# Patient Record
Sex: Male | Born: 1940
Health system: Southern US, Community
[De-identification: ages and names within clinical notes are randomized; demographics above are authoritative.]

## PROBLEM LIST (undated history)

## (undated) DIAGNOSIS — N200 Calculus of kidney: Secondary | ICD-10-CM

## (undated) DIAGNOSIS — R519 Headache, unspecified: Secondary | ICD-10-CM

## (undated) DIAGNOSIS — K219 Gastro-esophageal reflux disease without esophagitis: Secondary | ICD-10-CM

## (undated) DIAGNOSIS — R51 Headache: Secondary | ICD-10-CM

## (undated) DIAGNOSIS — G589 Mononeuropathy, unspecified: Secondary | ICD-10-CM

## (undated) DIAGNOSIS — F419 Anxiety disorder, unspecified: Secondary | ICD-10-CM

## (undated) DIAGNOSIS — E079 Disorder of thyroid, unspecified: Secondary | ICD-10-CM

## (undated) DIAGNOSIS — I10 Essential (primary) hypertension: Secondary | ICD-10-CM

## (undated) DIAGNOSIS — K5900 Constipation, unspecified: Secondary | ICD-10-CM

## (undated) DIAGNOSIS — N529 Male erectile dysfunction, unspecified: Secondary | ICD-10-CM

## (undated) DIAGNOSIS — M1990 Unspecified osteoarthritis, unspecified site: Secondary | ICD-10-CM

## (undated) DIAGNOSIS — M519 Unspecified thoracic, thoracolumbar and lumbosacral intervertebral disc disorder: Secondary | ICD-10-CM

## (undated) DIAGNOSIS — Z923 Personal history of irradiation: Secondary | ICD-10-CM

## (undated) DIAGNOSIS — I871 Compression of vein: Secondary | ICD-10-CM

## (undated) DIAGNOSIS — J189 Pneumonia, unspecified organism: Secondary | ICD-10-CM

## (undated) HISTORY — DX: Disorder of thyroid, unspecified: E07.9

## (undated) HISTORY — DX: Anxiety disorder, unspecified: F41.9

## (undated) HISTORY — DX: Essential (primary) hypertension: I10

## (undated) HISTORY — PX: TONSILLECTOMY: SUR1361

## (undated) HISTORY — PX: COLONOSCOPY: SHX174

## (undated) HISTORY — PX: LUMBAR DISC SURGERY: SHX700

## (undated) HISTORY — DX: Gastro-esophageal reflux disease without esophagitis: K21.9

## (undated) HISTORY — PX: CARDIAC SURGERY: SHX584

## (undated) HISTORY — PX: BREAST BIOPSY: SHX20

## (undated) HISTORY — DX: Male erectile dysfunction, unspecified: N52.9

## (undated) HISTORY — DX: Compression of vein: I87.1

## (undated) HISTORY — DX: Unspecified thoracic, thoracolumbar and lumbosacral intervertebral disc disorder: M51.9

---

## 1993-04-20 HISTORY — PX: US ECHOCARDIOGRAPHY: HXRAD669

## 1997-12-20 ENCOUNTER — Ambulatory Visit (HOSPITAL_COMMUNITY): Admission: RE | Admit: 1997-12-20 | Discharge: 1997-12-20 | Payer: Self-pay | Admitting: Family Medicine

## 1998-10-20 ENCOUNTER — Emergency Department (HOSPITAL_COMMUNITY): Admission: EM | Admit: 1998-10-20 | Discharge: 1998-10-20 | Payer: Self-pay | Admitting: Emergency Medicine

## 2000-06-14 ENCOUNTER — Encounter: Payer: Self-pay | Admitting: Family Medicine

## 2000-06-14 ENCOUNTER — Encounter: Admission: RE | Admit: 2000-06-14 | Discharge: 2000-06-14 | Payer: Self-pay | Admitting: Family Medicine

## 2000-08-08 ENCOUNTER — Emergency Department (HOSPITAL_COMMUNITY): Admission: EM | Admit: 2000-08-08 | Discharge: 2000-08-08 | Payer: Self-pay | Admitting: Emergency Medicine

## 2003-08-14 ENCOUNTER — Inpatient Hospital Stay (HOSPITAL_COMMUNITY): Admission: RE | Admit: 2003-08-14 | Discharge: 2003-08-22 | Payer: Self-pay | Admitting: Neurosurgery

## 2005-10-15 ENCOUNTER — Ambulatory Visit (HOSPITAL_COMMUNITY): Admission: RE | Admit: 2005-10-15 | Discharge: 2005-10-15 | Payer: Self-pay | Admitting: Neurosurgery

## 2007-01-24 ENCOUNTER — Emergency Department (HOSPITAL_COMMUNITY): Admission: EM | Admit: 2007-01-24 | Discharge: 2007-01-24 | Payer: Self-pay | Admitting: Emergency Medicine

## 2010-02-12 ENCOUNTER — Ambulatory Visit: Payer: Self-pay | Admitting: Cardiology

## 2010-09-14 ENCOUNTER — Other Ambulatory Visit: Payer: Self-pay | Admitting: Cardiology

## 2010-09-14 DIAGNOSIS — I10 Essential (primary) hypertension: Secondary | ICD-10-CM

## 2010-09-14 MED ORDER — LISINOPRIL-HYDROCHLOROTHIAZIDE 20-12.5 MG PO TABS: 1.0000 | ORAL_TABLET | Freq: Every day | ORAL | Status: DC

## 2010-09-14 NOTE — Telephone Encounter (Signed)
escribe medication per fax request  

## 2010-09-14 NOTE — Telephone Encounter (Signed)
[facility] ON [address] SAID CALLED OVER AND HAS NOT HEARD BACK. PATIENT STATES THAT HE THINKS [facility] LOST HIS SCRIPT FOR 3 MONTHS -THE DRUG IS  LISINOPRIL 20MG  AND HE WANTS IT FOR 3 MONTHS FOR THE YR.

## 2010-10-23 NOTE — H&P (Signed)
NAME:  George Dyer, George Dyer NO.:  [id number]   MEDICAL RECORD NO.:  [id number]                   PATIENT TYPE:  INP   LOCATION:  2899                                 FACILITY:  [facility]   PHYSICIAN:  Hilda Lias, M.D.                DATE OF BIRTH:  [birth year]   DATE OF ADMISSION:  08/14/2003  DATE OF DISCHARGE:                                HISTORY & PHYSICAL   George Dyer is a gentleman who was seen by me initially about a month ago  in my office with back pain for many years.  The pain is going not only to  the lumbar area but down to both legs, the right worse than the left one,  and feeling that the right calf is having a burning sensation.  The patient  was seen before by a neurosurgeon who advised him to surgery, but now he is  feeling that he is to the point that it is getting more difficult for him  walk, and he wants to proceed with surgery.  He had an MRI which was three  years old, and a new one which was a repeat.  Spinal surgery is advised.   MEDICAL HISTORY:  Open heart surgery.   ALLERGIES:  He is allergic to CODEINE.   SOCIAL HISTORY:  Negative.   FAMILY HISTORY:  Mother is 28 years old with heart disease.   REVIEW OF SYSTEMS:  Positive for high blood pressure and back pain.   PHYSICAL EXAMINATION:  GENERAL:  The patient who came to my office with his  wife.  He was limping from the right leg.  HEENT:  Normal.  LUNGS:  Clear.  HEART:  Sounds are normal.  There is a scar in the midline from a previous  surgery.  ABDOMEN:  Normal.  NEUROLOGIC:  Strength 5/5 except in the right foot where he has weakness of  dorsiflexion 4/5 and 2/5 on plantar flexion.  He complained of pain to the  right calf.  Reflexes symmetrical with decrease of the right ankle jerk.   The MRI showed that he has a severe case of degenerative disk disease at the  level of L4-5 with a herniated disk to the right.  He had a new repeat x-ray  which showed that the  degenerative disk disease has progressed at the level  of 4-5.  He has incidentally a small benign tumor at the level of the mid  root.   CLINICAL IMPRESSION:  Degenerative disk disease, L4-5, with a radiculopathy  and weakness of the right foot.   DISPOSITION:  The patient is being admitted for surgery.  The procedure will  be an L4-L5 diskectomy with interbody fusion with posterior ___________.  He  and his wife know about the risk with the surgery including the possibility  of infection, CSF leak, failure of the bone graft, failure of the hardware,  damage to the vessels  of the abdomen.                                                Hilda Lias, M.D.    EB/MEDQ  D:  08/14/2003  T:  08/14/2003  Job:  [id number]

## 2010-10-23 NOTE — Op Note (Signed)
NAME:  George Dyer, George Dyer                        ACCOUNT NO.:  [id number]   MEDICAL RECORD NO.:  [id number]                   PATIENT TYPE:  INP   LOCATION:  3172                                 FACILITY:  [facility]   PHYSICIAN:  Hilda Lias, M.D.                DATE OF BIRTH:  [birth year]   DATE OF PROCEDURE:  08/14/2003  DATE OF DISCHARGE:                                 OPERATIVE REPORT   PREOPERATIVE DIAGNOSIS:  L4-L5 degenerative disc disease with chronic L5  radiculopathy and foraminal stenosis.   POSTOPERATIVE DIAGNOSIS:  L4-L5 degenerative disc disease with chronic L5  radiculopathy and foraminal stenosis.   PROCEDURE:  Bilateral L5 laminectomy and facetectomy, bilateral L4-L5  discectomy, decompression of the L4 and L5 nerve root, interbody fusion with  allograft and autograft, pedicle screws for L4-L5, posterolateral fusion  with autograft L4 to L5 transverse process, C-arm, Cell Saver.   SURGEON:  Hilda Lias, M.D.   ASSISTANT:  Coletta Memos, M.D.   CLINICAL HISTORY:  The patient was admitted because of back pain with  radiation to both legs, right worse than left.  This problem had been going  on for many years and in the past, he was advised fusion by neurosurgeon.  We repeated the x-rays which showed, indeed, there was some worsening of the  degenerative disc disease at L4-L5.  The patient had small incidental  neuroma at the level of L2-L3.  When I reviewed the MRI, it was present  before three years ago and there has bene no changes in the size.  The  patient wanted to proceed with surgery because he was getting worse.   PROCEDURE:  The patient was taken to the OR and after intubation, he was  positioned in a prone manner.  The back was prepped with Betadine.  A  midline incision from the upper part of L4 to the lower part of L5 was made.  The muscles and fascia were retracted laterally until we were able to feel  the lateral facet as well as the transverse  process of L4 and L5.  We proved  we were in the right place with an x-ray showing the level of L4-L5.  Then  we removed the spinous process of L4 as well as the lamina and the facet.  We went laterally and we decompressed the L4-L5 nerve root.  On the right  side, he has a herniated disc with some extension laterally.  An incision  was made and bilateral total gross discectomy was accomplished.  The  endplates were curetted and two pieces of allograft 10 by 24 was inserted.  Inbetween and laterally, we used the patient's autograft.  Then, we went  laterally, we identified the transverse process of L4-L5.  The periosteum  was removed and using the autograft, the area was filled with that mixture.  Then, with the help of the C-arm, we localized the pedicles of  L4 and L5.  A  pedicle probe was inserted followed by a screw, 6.5 by 45.  AP and lateral  views showed there was good position.  We felt the foramen and there was no  evidence of any hardware in the foramen.  Having done this, rods were used  from L4 to L5 pedicle screws followed by tap.  The area was secured in  place.  A crosslink was used from right to left.  Then, the area was  irrigated.  We palpated the L4 and L5 nerve root and there was plenty of  space.  The area was irrigated.  Marcaine was used to infiltrate the muscle  and the wound was closed with Vicryl and Steri-Strips.                                              Hilda Lias, M.D.   EB/MEDQ  D:  08/14/2003  T:  08/14/2003  Job:  [id number]

## 2010-10-23 NOTE — Discharge Summary (Signed)
NAME:  George Dyer, George Dyer                        ACCOUNT NO.:  [id number]   MEDICAL RECORD NO.:  [id number]                   PATIENT TYPE:  INP   LOCATION:  3013                                 FACILITY:  [facility]   PHYSICIAN:  Hilda Lias, M.D.                DATE OF BIRTH:  29-Oct-1940   DATE OF ADMISSION:  08/14/2003  DATE OF DISCHARGE:  08/22/2003                                 DISCHARGE SUMMARY   ADMISSION DIAGNOSIS:  Degenerative joint disease with a lumbar stenosis at  the level of L4-5, L5-S1.   POSTOPERATIVE DIAGNOSES:  1. Degenerative joint disease with a lumbar stenosis at the level of L4-5,     L5-S1.  2. Bilateral pneumonia.   CLINICAL HISTORY:  The patient was admitted because of back pain radiating  down to both legs.  X-rays show degenerative joint disease at the level of  L4-5, L5-S1 with lumbar stenosis.  The patient agreed with surgery.   LABORATORY DATA:  Within normal limits.   COURSE IN THE HOSPITAL:  The patient was taken to surgery and bilateral L5  and L4 laminectomy with a fusion at the level of L4 and L5 was done.  The  patient did well but he developed quite a bit of back pain and later on some  pain in his legs.  Then his temperature went up and it was shown by x-ray  that he has a bilateral pneumonia.  For the past 24 hours, he has been with  no complaint whatsoever.  He wants to go home.  At the present time, he is  taking Avelox.  The patient was seen by the hospitalists.  Today he is  feeling much better.  He wants to go home.   CONDITION ON DISCHARGE:  Improvement.   MEDICATIONS:  Percocet, Flexeril, and Avelox.   DIET:  Regular.   ACTIVITY:  He is not to drive until he is dismissed.                                                Hilda Lias, M.D.    EB/MEDQ  D:  08/22/2003  T:  08/24/2003  Job:  [id number]

## 2011-03-26 ENCOUNTER — Encounter: Payer: Self-pay | Admitting: Cardiology

## 2011-03-31 ENCOUNTER — Ambulatory Visit (INDEPENDENT_AMBULATORY_CARE_PROVIDER_SITE_OTHER): Payer: BC Managed Care – PPO | Admitting: Cardiology

## 2011-03-31 ENCOUNTER — Telehealth: Payer: Self-pay | Admitting: *Deleted

## 2011-03-31 ENCOUNTER — Encounter: Payer: Self-pay | Admitting: Cardiology

## 2011-03-31 VITALS — BP 127/72 | HR 66 | Ht 69.0 in | Wt 166.8 lb

## 2011-03-31 DIAGNOSIS — K219 Gastro-esophageal reflux disease without esophagitis: Secondary | ICD-10-CM

## 2011-03-31 DIAGNOSIS — I871 Compression of vein: Secondary | ICD-10-CM | POA: Insufficient documentation

## 2011-03-31 DIAGNOSIS — I10 Essential (primary) hypertension: Secondary | ICD-10-CM

## 2011-03-31 LAB — BASIC METABOLIC PANEL
Chloride: 104 mEq/L (ref 96–112)
GFR: 75.26 mL/min (ref >60.00)
Potassium: 3.9 mEq/L (ref 3.5–5.1)

## 2011-03-31 NOTE — Telephone Encounter (Signed)
Notified of lab results. 

## 2011-03-31 NOTE — Patient Instructions (Signed)
We will check blood work today and call with the results.  Continue your same medications.  I will see you again in 1 year.

## 2011-03-31 NOTE — Assessment & Plan Note (Signed)
Blood pressure is very well controlled on lisinopril HCT. We will check a basic metabolic panel today. He is to continue on his current therapy we'll followup again in one year with fasting lab work.

## 2011-03-31 NOTE — Telephone Encounter (Signed)
Message copied by Lorayne Bender on Wed Mar 31, 2011  1:51 PM ------      Message from: [place], PETER M      Created: Wed Mar 31, 2011  1:14 PM       bmet is ok.       Theron Arista [place]

## 2011-03-31 NOTE — Progress Notes (Signed)
   George Dyer Date of Birth: 1940-11-30 Medical Record #[id number]  History of Present Illness: Quantae is seen for yearly followup of hypertension. He reports that he has been doing very well. His only complaint is that he has chronic foot pain. He had previous lumbar surgery which did not help at all. He has been on a chronic methadone program. He denies any chest pain or shortness of breath. In general he feels very well. 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  Current Outpatient Prescriptions on File Prior to Visit  Medication Sig Dispense Refill  . Lansoprazole (PREVACID PO) Take by mouth daily.        Marland Kitchen lisinopril-hydrochlorothiazide (PRINZIDE,ZESTORETIC) 20-12.5 MG per tablet Take 1 tablet by mouth daily.  90 tablet  3  . METHADONE HCL PO Take by mouth 2 (two) times daily.          No Known Allergies  Past Medical History  Diagnosis Date  . Hypertension   . GERD (gastroesophageal reflux disease)   . 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 Lumbar disc disease   . ED (erectile dysfunction)     Past Surgical History  Procedure Date  . Tonsillectomy   . Breast biopsy     RIGHT BREAST  . Inguinal hernia repair   . Lumbar disc surgery   . US echocardiography 04/20/1993    EF 65%    History  Smoking status  . Former Smoker  . Quit date: 03/25/1981  Smokeless tobacco  . Not on file    History  Alcohol Use No    Family History  Problem Relation Age of Onset  . Hypertension Mother   . Heart attack Father     Review of Systems: As noted in history of present illness.  All other systems were reviewed and are negative.  Physical Exam: BP 127/72  Pulse 66  Ht 5\' 9"  (1.753 m)  Wt 166 lb 12.8 oz (75.66 kg)  BMI 24.63 kg/m2 The patient is alert and oriented x 3.  The Dyer and affect are normal.  The skin is warm and dry.  Color is normal.  The HEENT exam reveals that the sclera are  nonicteric.  The mucous membranes are moist.  The carotids are 2+ without bruits.  There is no thyromegaly.  He has marked venous engorgement in his upper extremities with significant collateral channels in his chest and abdomen.  The lungs are clear.  The chest wall is non tender.  The heart exam reveals a regular rate with a normal S1 and S2.  There are no murmurs, gallops, or rubs.  The PMI is not displaced.   Abdominal exam reveals good bowel sounds.  There is no guarding or rebound.  There is no hepatosplenomegaly or tenderness.  There are no masses.  Exam of the legs reveal no clubbing, cyanosis, or edema.  The legs are without rashes.  The distal pulses are intact.  Cranial nerves II - XII are intact.  Motor and sensory functions are intact.  The gait is normal.  LABORATORY DATA: ECG today is normal.  Assessment / Plan:

## 2011-04-26 ENCOUNTER — Encounter: Payer: Self-pay | Admitting: Cardiology

## 2011-05-06 ENCOUNTER — Telehealth: Payer: Self-pay | Admitting: Cardiology

## 2011-05-06 NOTE — Telephone Encounter (Signed)
LOV,Labs faxed to [facility] @ [phone]  05/06/11/km

## 2011-09-02 ENCOUNTER — Other Ambulatory Visit: Payer: Self-pay | Admitting: *Deleted

## 2011-09-02 DIAGNOSIS — I10 Essential (primary) hypertension: Secondary | ICD-10-CM

## 2011-09-02 MED ORDER — LISINOPRIL-HYDROCHLOROTHIAZIDE 20-12.5 MG PO TABS: 1.0000 | ORAL_TABLET | Freq: Every day | ORAL | Status: DC

## 2012-03-03 ENCOUNTER — Other Ambulatory Visit: Payer: Self-pay | Admitting: Cardiology

## 2012-03-29 ENCOUNTER — Ambulatory Visit: Payer: BC Managed Care – PPO | Admitting: Cardiology

## 2012-04-14 ENCOUNTER — Encounter: Payer: Self-pay | Admitting: Cardiology

## 2012-04-14 ENCOUNTER — Ambulatory Visit (INDEPENDENT_AMBULATORY_CARE_PROVIDER_SITE_OTHER): Payer: BC Managed Care – PPO | Admitting: Cardiology

## 2012-04-14 VITALS — BP 130/72 | HR 73 | Ht 69.0 in | Wt 168.8 lb

## 2012-04-14 DIAGNOSIS — I871 Compression of vein: Secondary | ICD-10-CM

## 2012-04-14 DIAGNOSIS — I10 Essential (primary) hypertension: Secondary | ICD-10-CM

## 2012-04-14 LAB — LIPID PANEL
Cholesterol: 213 mg/dL — ABNORMAL HIGH (ref 0–200)
HDL: 38.2 mg/dL — ABNORMAL LOW (ref >39.00)
VLDL: 35.6 mg/dL (ref 0.0–40.0)

## 2012-04-14 LAB — BASIC METABOLIC PANEL
Calcium: 9 mg/dL (ref 8.4–10.5)
Chloride: 104 mEq/L (ref 96–112)
GFR: 77.61 mL/min (ref >60.00)
Glucose, Bld: 90 mg/dL (ref 70–99)
Sodium: 139 mEq/L (ref 135–145)

## 2012-04-14 LAB — HEPATIC FUNCTION PANEL
AST: 13 U/L (ref 0–37)
Albumin: 3.8 g/dL (ref 3.5–5.2)

## 2012-04-14 MED ORDER — LISINOPRIL-HYDROCHLOROTHIAZIDE 20-12.5 MG PO TABS: 1.0000 | ORAL_TABLET | Freq: Every day | ORAL | Status: DC

## 2012-04-14 NOTE — Progress Notes (Signed)
   George Dyer Date of Birth: 1940/10/09 Medical Record #[id number]  History of Present Illness: George Dyer is seen for yearly followup of hypertension. He reports that he has been doing very well. He denies any chest pain, shortness of breath, or palpitations. He has had no increase in edema.  Current Outpatient Prescriptions on File Prior to Visit  Medication Sig Dispense Refill  . Lansoprazole (PREVACID PO) Take by mouth daily.        Marland Kitchen METHADONE HCL PO Take by mouth 2 (two) times daily.        . [DISCONTINUED] lisinopril-hydrochlorothiazide (PRINZIDE,ZESTORETIC) 20-12.5 MG per tablet TAKE 1 TABLET BY MOUTH DAILY.  90 tablet  0    No Known Allergies  Past Medical History  Diagnosis Date  . Hypertension   . GERD (gastroesophageal reflux disease)   . 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 Lumbar disc disease   . ED (erectile dysfunction)     Past Surgical History  Procedure Date  . Tonsillectomy   . Breast biopsy     RIGHT BREAST  . Inguinal hernia repair   . Lumbar disc surgery   . US echocardiography 04/20/1993    EF 65%    History  Smoking status  . Former Smoker  . Quit date: 03/25/1981  Smokeless tobacco  . Not on file    History  Alcohol Use No    Family History  Problem Relation Age of Onset  . Hypertension Mother   . Heart attack Father     Review of Systems: As noted in history of present illness. He has chronic right foot pain.  All other systems were reviewed and are negative.  Physical Exam: BP 130/72  Pulse 73  Ht 5\' 9"  (1.753 m)  Wt 168 lb 12.8 oz (76.567 kg)  BMI 24.93 kg/m2  SpO2 98% The patient is alert and oriented x 3.   The HEENT exam reveals that the sclera are nonicteric.  The mucous membranes are moist.  The carotids are 2+ without bruits.  There is no thyromegaly.  He has marked venous engorgement in his upper extremities with significant collateral channels in his chest and abdomen.  The lungs are  clear.  The chest wall is non tender.  The heart exam reveals a regular rate with a normal S1 and S2.  There are no murmurs, gallops, or rubs.  The PMI is not displaced.   Abdominal exam reveals good bowel sounds.  There are no masses.  Exam of the legs reveal no clubbing, cyanosis, or edema.  The legs are without rashes.  The distal pulses are intact.  Cranial nerves II - XII are intact.  Motor and sensory functions are intact.  The gait is normal.  LABORATORY DATA: ECG today is normal.  Assessment / Plan: 1. Hypertension, well-controlled. Will followup chemistry panel and fasting lipids today.  2. 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 Asymptomatic.

## 2012-04-14 NOTE — Patient Instructions (Signed)
We will call with the results of your lab work today.  I will see you again in one year.

## 2012-06-20 ENCOUNTER — Ambulatory Visit (INDEPENDENT_AMBULATORY_CARE_PROVIDER_SITE_OTHER): Payer: BC Managed Care – PPO | Admitting: General Surgery

## 2012-06-20 ENCOUNTER — Encounter (INDEPENDENT_AMBULATORY_CARE_PROVIDER_SITE_OTHER): Payer: Self-pay | Admitting: General Surgery

## 2012-06-20 VITALS — BP 132/78 | HR 76 | Temp 97.8°F | Resp 16 | Ht 69.0 in | Wt 175.8 lb

## 2012-06-20 DIAGNOSIS — K4091 Unilateral inguinal hernia, without obstruction or gangrene, recurrent: Secondary | ICD-10-CM

## 2012-06-20 NOTE — Progress Notes (Signed)
Patient ID: George Dyer, male   DOB: 1941-04-09, 72 y.o.   MRN: [id number]  Chief Complaint  Patient presents with  . New Evaluation    eval RIH    HPI George Dyer is a 72 y.o. male.  The patient is a 72 year old male who is self-referred for a right inguinal hernia. Patient states she's had this fixed approximately 15-20 years ago. He states that this incident began approximately 7 days ago which time he was moving heavy objects. He states is causing him pain which radiates down his leg as well as scrotum. He had no signs or symptoms of incarceration or obstruction. Patient has had a long history of previous cardiothoracic exploratory surgery for what seems to be SVC syndrome which has resulted in multiple abdominal wall varices. The patient sees Dr. [place] first hypertension. HPI  Past Medical History  Diagnosis Date  . Hypertension   . GERD (gastroesophageal reflux disease)   . 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 Lumbar disc disease   . ED (erectile dysfunction)     Past Surgical History  Procedure Date  . Tonsillectomy   . Breast biopsy     RIGHT BREAST  . Inguinal hernia repair   . Lumbar disc surgery   . US echocardiography 04/20/1993    EF 65%    Family History  Problem Relation Age of Onset  . Hypertension Mother   . Heart attack Father   . Heart disease Father   . Cancer Maternal Aunt     breast  . Cancer Maternal Uncle     Social History History  Substance Use Topics  . Smoking status: Former Smoker    Quit date: 03/25/1981  . Smokeless tobacco: Not on file  . Alcohol Use: No    No Known Allergies  Current Outpatient Prescriptions  Medication Sig Dispense Refill  . Lansoprazole (PREVACID PO) Take by mouth daily.        Marland Kitchen lisinopril-hydrochlorothiazide (PRINZIDE,ZESTORETIC) 20-12.5 MG per tablet Take 1 tablet by mouth daily.  90 tablet  3  . METHADONE HCL PO Take by mouth as needed.         Review of  Systems Review of Systems  Constitutional: Negative.   Respiratory: Negative.   Cardiovascular: Negative.   Gastrointestinal: Negative.   Neurological: Negative.     Blood pressure 132/78, pulse 76, temperature 97.8 F (36.6 C), temperature source Oral, resp. rate 16, height 5\' 9"  (1.753 m), weight 175 lb 12.8 oz (79.742 kg), SpO2 97.00%.  Physical Exam Physical Exam  Constitutional: He appears well-developed and well-nourished.  HENT:  Head: Normocephalic and atraumatic.  Eyes: Conjunctivae normal and EOM are normal. Pupils are equal, round, and reactive to light.  Neck: Normal range of motion.  Cardiovascular: Normal rate and normal heart sounds.   Pulmonary/Chest: Effort normal and breath sounds normal.  Abdominal: Bowel sounds are normal. A hernia is present. Hernia confirmed positive in the right inguinal area (recurrent).      Data Reviewed none  Assessment    72 year old male with a recurrent right inguinal hernia, SVC syndrome, and abdominal wall varices    Plan    1. The patient will require cardiac clearance prior to scheduling for the operating room.  2. We'll schedule patient for a laparoscopic right inguinal hernia repair with Mesh.  3. All risks and benefits were discussed with the patient, to generally include infection, bleeding, damage to surrounding structures, nerve damage, and  recurrence. Alternatives were offered and described.  All questions were answered and the patient voiced understanding of the procedure and wishes to proceed at this point.        Marigene Ehlers., Nathaneil Feagans 06/20/2012, 3:54 PM

## 2012-06-23 ENCOUNTER — Telehealth: Payer: Self-pay | Admitting: Cardiology

## 2012-06-23 NOTE — Telephone Encounter (Signed)
New  Problem:    Status of cardiac clearance for upcoming surgery.    Dr. Derrell Lolling office # [phone].

## 2012-06-23 NOTE — Telephone Encounter (Signed)
He is cleared for hernia surgery.  George Postlethwait [place] MD, [facility]

## 2012-06-23 NOTE — Telephone Encounter (Signed)
Spoke with pt's wife who reports pt needs hernia repair. Dr. Derrell Lolling is requesting surgical clearance. I told pt's wife I would send information to Dr. [place] and Elnita Maxwell.  Phone number for call back on Monday for pt is [phone].

## 2012-06-26 NOTE — Telephone Encounter (Signed)
Dr.Ramirez's office called was told Dr.Jordan cleared patient for surgery.Note faxed to fax # [phone].

## 2012-06-27 ENCOUNTER — Telehealth (INDEPENDENT_AMBULATORY_CARE_PROVIDER_SITE_OTHER): Payer: Self-pay | Admitting: General Surgery

## 2012-06-27 NOTE — Telephone Encounter (Signed)
Called to let patient know that we had cardiac clearance and I was taking the orders to the OR schedulers now and to give me a call 1/27 if he hasn't heard anything by then

## 2012-06-30 ENCOUNTER — Encounter (HOSPITAL_COMMUNITY): Payer: Self-pay | Admitting: Pharmacy Technician

## 2012-07-05 ENCOUNTER — Other Ambulatory Visit (INDEPENDENT_AMBULATORY_CARE_PROVIDER_SITE_OTHER): Payer: Self-pay | Admitting: General Surgery

## 2012-07-05 ENCOUNTER — Telehealth (INDEPENDENT_AMBULATORY_CARE_PROVIDER_SITE_OTHER): Payer: Self-pay

## 2012-07-05 NOTE — Telephone Encounter (Signed)
Pre admit called and said surgical orders for this patient need to be put in epic.

## 2012-07-06 ENCOUNTER — Ambulatory Visit (HOSPITAL_COMMUNITY)
Admission: RE | Admit: 2012-07-06 | Discharge: 2012-07-06 | Disposition: A | Discharge status: Home / Self Care | Payer: BC Managed Care – PPO | Source: Ambulatory Visit | Attending: General Surgery | Admitting: General Surgery

## 2012-07-06 ENCOUNTER — Encounter (HOSPITAL_COMMUNITY): Payer: Self-pay

## 2012-07-06 ENCOUNTER — Encounter (HOSPITAL_COMMUNITY)
Admission: RE | Admit: 2012-07-06 | Discharge: 2012-07-06 | Disposition: A | Discharge status: Home / Self Care | Payer: BC Managed Care – PPO | Source: Ambulatory Visit | Attending: General Surgery | Admitting: General Surgery

## 2012-07-06 LAB — BASIC METABOLIC PANEL
BUN: 15 mg/dL (ref 6–23)
Chloride: 103 mEq/L (ref 96–112)
Creatinine, Ser: 1.14 mg/dL (ref 0.50–1.35)
GFR calc Af Amer: 73 mL/min — ABNORMAL LOW (ref >90)

## 2012-07-06 LAB — CBC
HCT: 43.7 % (ref 39.0–52.0)
MCH: 28.5 pg (ref 26.0–34.0)
MCHC: 32.5 g/dL (ref 30.0–36.0)
RDW: 13.3 % (ref 11.5–15.5)

## 2012-07-06 LAB — SURGICAL PCR SCREEN: MRSA, PCR: NEGATIVE

## 2012-07-06 NOTE — Pre-Procedure Instructions (Signed)
KAVIR SAVOCA  07/06/2012   Your procedure is scheduled on:  Monday July 10, 2012  Report to [facility] at 0730 AM.  Call this number if you have problems the morning of surgery: [phone]   Remember:   Do not eat food or drink liquids after midnight.   Take these medicines the morning of surgery with A SIP OF WATER: Gabapentin   Do not wear jewelry.  Do not wear lotions, powders, or perfumes. You may wear deodorant.             Men may shave face and neck.  Do not bring valuables to the hospital.  Contacts, dentures or bridgework may not be worn into surgery.  Leave suitcase in the car. After surgery it may be brought to your room.  For patients admitted to the hospital, checkout time is 11:00 AM the day of  discharge.   Patients discharged the day of surgery will not be allowed to drive  home.  Special Instructions: Shower using CHG 2 nights before surgery and the night before surgery.  If you shower the day of surgery use CHG.  Use special wash - you have one bottle of CHG for all showers.  You should use approximately 1/3 of the bottle for each shower.   Please read over the following fact sheets that you were given: Pain Booklet, Coughing and Deep Breathing, MRSA Information and Surgical Site Infection Prevention

## 2012-07-09 MED ORDER — CEFAZOLIN SODIUM-DEXTROSE 2-3 GM-% IV SOLR
2.0000 g | INTRAVENOUS | Status: AC
  Administered 2012-07-10: 2 g via INTRAVENOUS
  Filled 2012-07-09: qty 50

## 2012-07-10 ENCOUNTER — Encounter (HOSPITAL_COMMUNITY): Payer: Self-pay | Admitting: Certified Registered Nurse Anesthetist

## 2012-07-10 ENCOUNTER — Encounter (HOSPITAL_COMMUNITY)
Admission: RE | Disposition: A | Discharge status: Home / Self Care | Payer: Self-pay | Source: Ambulatory Visit | Attending: General Surgery

## 2012-07-10 ENCOUNTER — Ambulatory Visit (HOSPITAL_COMMUNITY)
Admission: RE | Admit: 2012-07-10 | Discharge: 2012-07-10 | Disposition: A | Discharge status: Home / Self Care | Payer: BC Managed Care – PPO | Source: Ambulatory Visit | Attending: General Surgery | Admitting: General Surgery

## 2012-07-10 ENCOUNTER — Encounter (HOSPITAL_COMMUNITY): Payer: Self-pay | Admitting: *Deleted

## 2012-07-10 ENCOUNTER — Ambulatory Visit (HOSPITAL_COMMUNITY): Payer: BC Managed Care – PPO | Admitting: Certified Registered Nurse Anesthetist

## 2012-07-10 DIAGNOSIS — K4091 Unilateral inguinal hernia, without obstruction or gangrene, recurrent: Secondary | ICD-10-CM | POA: Insufficient documentation

## 2012-07-10 DIAGNOSIS — Z01818 Encounter for other preprocedural examination: Secondary | ICD-10-CM | POA: Insufficient documentation

## 2012-07-10 DIAGNOSIS — Z01812 Encounter for preprocedural laboratory examination: Secondary | ICD-10-CM | POA: Insufficient documentation

## 2012-07-10 DIAGNOSIS — I10 Essential (primary) hypertension: Secondary | ICD-10-CM | POA: Insufficient documentation

## 2012-07-10 HISTORY — PX: INGUINAL HERNIA REPAIR: SHX194

## 2012-07-10 HISTORY — PX: INSERTION OF MESH: SHX5868

## 2012-07-10 HISTORY — PX: INGUINAL HERNIA REPAIR: SUR1180

## 2012-07-10 SURGERY — REPAIR, HERNIA, INGUINAL, LAPAROSCOPIC
Anesthesia: General | Site: Abdomen | Laterality: Right | Wound class: Clean

## 2012-07-10 MED ORDER — LIDOCAINE HCL 4 % MT SOLN
OROMUCOSAL | Status: DC | PRN
  Administered 2012-07-10: 4 mL via TOPICAL

## 2012-07-10 MED ORDER — OXYCODONE-ACETAMINOPHEN 5-325 MG PO TABS
ORAL_TABLET | ORAL | Status: AC
  Administered 2012-07-10: 1 via ORAL
  Filled 2012-07-10: qty 1

## 2012-07-10 MED ORDER — MORPHINE SULFATE 2 MG/ML IJ SOLN
INTRAMUSCULAR | Status: AC
  Filled 2012-07-10: qty 1

## 2012-07-10 MED ORDER — SODIUM CHLORIDE 0.9 % IR SOLN
Status: DC | PRN
  Administered 2012-07-10: 1

## 2012-07-10 MED ORDER — METOCLOPRAMIDE HCL 5 MG/ML IJ SOLN: 10.0000 mg | Freq: Once | INTRAMUSCULAR | Status: DC | PRN

## 2012-07-10 MED ORDER — LIDOCAINE HCL (CARDIAC) 20 MG/ML IV SOLN
INTRAVENOUS | Status: DC | PRN
  Administered 2012-07-10: 100 mg via INTRAVENOUS

## 2012-07-10 MED ORDER — ROCURONIUM BROMIDE 100 MG/10ML IV SOLN
INTRAVENOUS | Status: DC | PRN
  Administered 2012-07-10: 40 mg via INTRAVENOUS
  Administered 2012-07-10: 10 mg via INTRAVENOUS

## 2012-07-10 MED ORDER — KETOROLAC TROMETHAMINE 30 MG/ML IJ SOLN
INTRAMUSCULAR | Status: AC
  Administered 2012-07-10: 15 mg
  Filled 2012-07-10: qty 1

## 2012-07-10 MED ORDER — OXYCODONE HCL 5 MG PO TABS
ORAL_TABLET | ORAL | Status: AC
  Filled 2012-07-10: qty 1

## 2012-07-10 MED ORDER — NEOSTIGMINE METHYLSULFATE 1 MG/ML IJ SOLN
INTRAMUSCULAR | Status: DC | PRN
  Administered 2012-07-10: 4 mg via INTRAVENOUS

## 2012-07-10 MED ORDER — PHENYLEPHRINE HCL 10 MG/ML IJ SOLN
INTRAMUSCULAR | Status: DC | PRN
  Administered 2012-07-10 (×3): 40 ug via INTRAVENOUS

## 2012-07-10 MED ORDER — ONDANSETRON HCL 4 MG/2ML IJ SOLN
INTRAMUSCULAR | Status: DC | PRN
  Administered 2012-07-10 (×2): 4 mg via INTRAVENOUS

## 2012-07-10 MED ORDER — GLYCOPYRROLATE 0.2 MG/ML IJ SOLN
INTRAMUSCULAR | Status: DC | PRN
  Administered 2012-07-10: 0.6 mg via INTRAVENOUS

## 2012-07-10 MED ORDER — MORPHINE SULFATE 4 MG/ML IJ SOLN
INTRAMUSCULAR | Status: AC
  Filled 2012-07-10: qty 1

## 2012-07-10 MED ORDER — LACTATED RINGERS IV SOLN
INTRAVENOUS | Status: DC | PRN
  Administered 2012-07-10 (×2): via INTRAVENOUS

## 2012-07-10 MED ORDER — OXYCODONE HCL 5 MG/5ML PO SOLN: 5.0000 mg | Freq: Once | ORAL | Status: AC | PRN

## 2012-07-10 MED ORDER — FENTANYL CITRATE 0.05 MG/ML IJ SOLN
INTRAMUSCULAR | Status: DC | PRN
  Administered 2012-07-10 (×2): 50 ug via INTRAVENOUS

## 2012-07-10 MED ORDER — MORPHINE SULFATE 2 MG/ML IJ SOLN
1.0000 mg | INTRAMUSCULAR | Status: DC | PRN
  Administered 2012-07-10 (×3): 2 mg via INTRAVENOUS

## 2012-07-10 MED ORDER — BUPIVACAINE HCL 0.25 % IJ SOLN
INTRAMUSCULAR | Status: DC | PRN
  Administered 2012-07-10: 30 mL

## 2012-07-10 MED ORDER — MIDAZOLAM HCL 5 MG/5ML IJ SOLN
INTRAMUSCULAR | Status: DC | PRN
  Administered 2012-07-10: 2 mg via INTRAVENOUS

## 2012-07-10 MED ORDER — OXYCODONE HCL 5 MG PO TABS
5.0000 mg | ORAL_TABLET | Freq: Once | ORAL | Status: AC | PRN
  Administered 2012-07-10: 5 mg via ORAL

## 2012-07-10 MED ORDER — KETOROLAC TROMETHAMINE 15 MG/ML IJ SOLN
15.0000 mg | Freq: Once | INTRAMUSCULAR | Status: AC
Start: 2012-07-10 — End: 2012-07-10
  Administered 2012-07-10: 15 mg via INTRAVENOUS

## 2012-07-10 MED ORDER — OXYCODONE-ACETAMINOPHEN 5-325 MG PO TABS: 1.0000 | ORAL_TABLET | ORAL | Status: DC | PRN

## 2012-07-10 MED ORDER — BUPIVACAINE HCL (PF) 0.25 % IJ SOLN
INTRAMUSCULAR | Status: AC
  Filled 2012-07-10: qty 30

## 2012-07-10 MED ORDER — OXYCODONE-ACETAMINOPHEN 5-325 MG PO TABS
1.0000 | ORAL_TABLET | Freq: Once | ORAL | Status: AC
  Administered 2012-07-10: 1 via ORAL

## 2012-07-10 MED ORDER — PROPOFOL 10 MG/ML IV BOLUS
INTRAVENOUS | Status: DC | PRN
  Administered 2012-07-10: 200 mg via INTRAVENOUS

## 2012-07-10 SURGICAL SUPPLY — 47 items
APPLIER CLIP LOGIC TI 5 (MISCELLANEOUS) IMPLANT
BENZOIN TINCTURE PRP APPL 2/3 (GAUZE/BANDAGES/DRESSINGS) ×2 IMPLANT
CANISTER SUCTION 2500CC (MISCELLANEOUS) IMPLANT
CHLORAPREP W/TINT 26ML (MISCELLANEOUS) ×2 IMPLANT
CLOTH BEACON ORANGE TIMEOUT ST (SAFETY) ×2 IMPLANT
COVER SURGICAL LIGHT HANDLE (MISCELLANEOUS) ×2 IMPLANT
DECANTER SPIKE VIAL GLASS SM (MISCELLANEOUS) ×2 IMPLANT
DEVICE TROCAR PUNCTURE CLOSURE (ENDOMECHANICALS) ×2 IMPLANT
DISSECT BALLN SPACEMKR + OVL (BALLOONS) ×2
DISSECTOR BALLN SPACEMKR + OVL (BALLOONS) ×1 IMPLANT
DISSECTOR BLUNT TIP ENDO 5MM (MISCELLANEOUS) IMPLANT
DRAPE UTILITY 15X26 W/TAPE STR (DRAPE) ×4 IMPLANT
ELECT REM PT RETURN 9FT ADLT (ELECTROSURGICAL) ×2
ELECTRODE REM PT RTRN 9FT ADLT (ELECTROSURGICAL) ×1 IMPLANT
GAUZE SPONGE 2X2 8PLY STRL LF (GAUZE/BANDAGES/DRESSINGS) ×1 IMPLANT
GLOVE BIO SURGEON STRL SZ7.5 (GLOVE) ×4 IMPLANT
GLOVE BIO SURGEON STRL SZ8 (GLOVE) ×2 IMPLANT
GLOVE BIOGEL PI IND STRL 6 (GLOVE) ×1 IMPLANT
GLOVE BIOGEL PI IND STRL 7.5 (GLOVE) ×2 IMPLANT
GLOVE BIOGEL PI IND STRL 8 (GLOVE) ×1 IMPLANT
GLOVE BIOGEL PI INDICATOR 6 (GLOVE) ×1
GLOVE BIOGEL PI INDICATOR 7.5 (GLOVE) ×2
GLOVE BIOGEL PI INDICATOR 8 (GLOVE) ×1
GOWN PREVENTION PLUS XLARGE (GOWN DISPOSABLE) ×2 IMPLANT
GOWN STRL NON-REIN LRG LVL3 (GOWN DISPOSABLE) ×2 IMPLANT
GOWN STRL REIN XL XLG (GOWN DISPOSABLE) ×2 IMPLANT
KIT BASIN OR (CUSTOM PROCEDURE TRAY) ×2 IMPLANT
KIT ROOM TURNOVER OR (KITS) ×2 IMPLANT
MESH PARIETEX 20X20CM (Mesh General) ×2 IMPLANT
NEEDLE INSUFFLATION 14GA 120MM (NEEDLE) ×2 IMPLANT
NS IRRIG 1000ML POUR BTL (IV SOLUTION) ×2 IMPLANT
PAD ARMBOARD 7.5X6 YLW CONV (MISCELLANEOUS) ×4 IMPLANT
RELOAD STAPLE HERNIA 4.0 BLUE (INSTRUMENTS) ×2 IMPLANT
RELOAD STAPLE HERNIA 4.8 BLK (STAPLE) IMPLANT
SCISSORS LAP 5X35 DISP (ENDOMECHANICALS) ×2 IMPLANT
SET IRRIG TUBING LAPAROSCOPIC (IRRIGATION / IRRIGATOR) IMPLANT
SET TROCAR LAP APPLE-HUNT 5MM (ENDOMECHANICALS) ×4 IMPLANT
SPONGE GAUZE 2X2 STER 10/PKG (GAUZE/BANDAGES/DRESSINGS) ×1
STAPLER HERNIA 12 8.5 360D (INSTRUMENTS) ×2 IMPLANT
SUT MNCRL AB 4-0 PS2 18 (SUTURE) ×2 IMPLANT
SUT VIC AB 1 CT1 27 (SUTURE) ×2
SUT VIC AB 1 CT1 27XBRD ANBCTR (SUTURE) ×1 IMPLANT
TOWEL OR 17X24 6PK STRL BLUE (TOWEL DISPOSABLE) ×2 IMPLANT
TOWEL OR 17X26 10 PK STRL BLUE (TOWEL DISPOSABLE) ×2 IMPLANT
TRAY FOLEY CATH 14FR (SET/KITS/TRAYS/PACK) ×2 IMPLANT
TRAY LAPAROSCOPIC (CUSTOM PROCEDURE TRAY) ×2 IMPLANT
TROCAR XCEL 12X100 BLDLESS (ENDOMECHANICALS) ×2 IMPLANT

## 2012-07-10 NOTE — Anesthesia Procedure Notes (Signed)
Procedure Name: Intubation Date/Time: 07/10/2012 9:07 AM Performed by: Margaree Mackintosh Pre-anesthesia Checklist: Patient identified, Timeout performed, Emergency Drugs available, Suction available and Patient being monitored Patient Re-evaluated:Patient Re-evaluated prior to inductionOxygen Delivery Method: Circle system utilized Preoxygenation: Pre-oxygenation with 100% oxygen Intubation Type: IV induction Ventilation: Oral airway inserted - appropriate to patient size and Mask ventilation without difficulty Laryngoscope Size: Mac and 3 Grade View: Grade I Tube type: Oral Tube size: 7.5 mm Number of attempts: 1 Airway Equipment and Method: LTA kit utilized and Stylet Placement Confirmation: ETT inserted through vocal cords under direct vision,  positive ETCO2 and breath sounds checked- equal and bilateral Secured at: 23 cm Tube secured with: Tape Dental Injury: Teeth and Oropharynx as per pre-operative assessment

## 2012-07-10 NOTE — Preoperative (Signed)
Beta Blockers   Reason not to administer Beta Blockers:Not Applicable 

## 2012-07-10 NOTE — Interval H&P Note (Signed)
History and Physical Interval Note:  07/10/2012 7:22 AM  George Dyer  has presented today for surgery, with the diagnosis of recurrent right ingunial hernia  The various methods of treatment have been discussed with the patient and family. After consideration of risks, benefits and other options for treatment, the patient has consented to  Procedure(s) (LRB) with comments: LAPAROSCOPIC INGUINAL HERNIA (Right) INSERTION OF MESH (Right) as a surgical intervention .  The patient's history has been reviewed, patient examined, no change in status, stable for surgery.  I have reviewed the patient's chart and labs.  Questions were answered to the patient's satisfaction.     Marigene Ehlers., George Dyer

## 2012-07-10 NOTE — H&P (View-Only) (Signed)
Patient ID: George Dyer, male   DOB: 03/15/1941, 72 y.o.   MRN: [id number]  Chief Complaint  Patient presents with  . New Evaluation    eval RIH    HPI George Dyer is a 72 y.o. male.  The patient is a 72-year-old male who is self-referred for a right inguinal hernia. Patient states she's had this fixed approximately 15-20 years ago. He states that this incident began approximately 7 days ago which time he was moving heavy objects. He states is causing him pain which radiates down his leg as well as scrotum. He had no signs or symptoms of incarceration or obstruction. Patient has had a long history of previous cardiothoracic exploratory surgery for what seems to be SVC syndrome which has resulted in multiple abdominal wall varices. The patient sees Dr. Jordan first hypertension. HPI  Past Medical History  Diagnosis Date  . Hypertension   . GERD (gastroesophageal reflux disease)   . 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 Lumbar disc disease   . ED (erectile dysfunction)     Past Surgical History  Procedure Date  . Tonsillectomy   . Breast biopsy     RIGHT BREAST  . Inguinal hernia repair   . Lumbar disc surgery   . Us echocardiography 04/20/1993    EF 65%    Family History  Problem Relation Age of Onset  . Hypertension Mother   . Heart attack Father   . Heart disease Father   . Cancer Maternal Aunt     breast  . Cancer Maternal Uncle     Social History History  Substance Use Topics  . Smoking status: Former Smoker    Quit date: 03/25/1981  . Smokeless tobacco: Not on file  . Alcohol Use: No    No Known Allergies  Current Outpatient Prescriptions  Medication Sig Dispense Refill  . Lansoprazole (PREVACID PO) Take by mouth daily.        . lisinopril-hydrochlorothiazide (PRINZIDE,ZESTORETIC) 20-12.5 MG per tablet Take 1 tablet by mouth daily.  90 tablet  3  . METHADONE HCL PO Take by mouth as needed.         Review of  Systems Review of Systems  Constitutional: Negative.   Respiratory: Negative.   Cardiovascular: Negative.   Gastrointestinal: Negative.   Neurological: Negative.     Blood pressure 132/78, pulse 76, temperature 97.8 F (36.6 C), temperature source Oral, resp. rate 16, height 5' 9" (1.753 m), weight 175 lb 12.8 oz (79.742 kg), SpO2 97.00%.  Physical Exam Physical Exam  Constitutional: He appears well-developed and well-nourished.  HENT:  Head: Normocephalic and atraumatic.  Eyes: Conjunctivae normal and EOM are normal. Pupils are equal, round, and reactive to light.  Neck: Normal range of motion.  Cardiovascular: Normal rate and normal heart sounds.   Pulmonary/Chest: Effort normal and breath sounds normal.  Abdominal: Bowel sounds are normal. A hernia is present. Hernia confirmed positive in the right inguinal area (recurrent).      Data Reviewed none  Assessment    72-year-old male with a recurrent right inguinal hernia, SVC syndrome, and abdominal wall varices    Plan    1. The patient will require cardiac clearance prior to scheduling for the operating room.  2. We'll schedule patient for a laparoscopic right inguinal hernia repair with Mesh.  3. All risks and benefits were discussed with the patient, to generally include infection, bleeding, damage to surrounding structures, nerve damage, and   recurrence. Alternatives were offered and described.  All questions were answered and the patient voiced understanding of the procedure and wishes to proceed at this point.        Baneza Bartoszek Jr., Devin Foskey 06/20/2012, 3:54 PM    

## 2012-07-10 NOTE — Anesthesia Preprocedure Evaluation (Signed)
Anesthesia Evaluation  Patient identified by MRN, date of birth, ID band Patient awake    Reviewed: Allergy & Precautions, H&P , NPO status , Patient's Chart, lab work & pertinent test results, reviewed documented beta blocker date and time   Airway Mallampati: II TM Distance: >3 FB Neck ROM: full    Dental   Pulmonary neg pulmonary ROS,  breath sounds clear to auscultation        Cardiovascular hypertension, On Medications Rhythm:regular     Neuro/Psych negative neurological ROS  negative psych ROS   GI/Hepatic Neg liver ROS, GERD-  Medicated and Controlled,  Endo/Other  negative endocrine ROS  Renal/GU negative Renal ROS  negative genitourinary   Musculoskeletal   Abdominal   Peds  Hematology negative hematology ROS (+)   Anesthesia Other Findings See surgeon's H&P   Reproductive/Obstetrics negative OB ROS                           Anesthesia Physical Anesthesia Plan  ASA: III  Anesthesia Plan: General   Post-op Pain Management:    Induction: Intravenous  Airway Management Planned: Oral ETT  Additional Equipment:   Intra-op Plan:   Post-operative Plan: Extubation in OR  Informed Consent: I have reviewed the patients History and Physical, chart, labs and discussed the procedure including the risks, benefits and alternatives for the proposed anesthesia with the patient or authorized representative who has indicated his/her understanding and acceptance.   Dental Advisory Given  Plan Discussed with: CRNA and Surgeon  Anesthesia Plan Comments:         Anesthesia Quick Evaluation

## 2012-07-10 NOTE — Progress Notes (Signed)
1600  Summary.........Marland KitchenAfter getting patient settled, right groin ddi, ice continues to that site..the patient has low pain tolerance.  When asked if prior pain med given in PACU has helped, he said, "yeah some"..Intravenous fluids continue at 100cc/hr.  He was given PO pain med at 12:51 pm and at  1500, pt was requested more medication.  He is also concerned about urinating..."burning sensation down there"   I did explain that during surgery and at the end of surgery a foley catheter was placed and removed, and that with surgery, the muscles tend to wake up slower.  Pt denies any prostate problems, time will tell.........DA  At 1530 I gave the medication Toradol 15mg  IV per Dr. Derrell Lolling order.  IVF continue as well as pt drinking sodas.After family left to go get a bite to eat, pt stood up and urinated....did burn at first, but seems more comfortable and relaxed. 1645  I also gave the patient, per Dr. Theodoro Doing order, a Roxicet 1 tab for the ride home...Marland KitchenMarland KitchenMarland Kitchen Instructions given and patient got dressed and was discharged.  Dr. Derrell Lolling did call back earlier to check on patient. DA

## 2012-07-10 NOTE — Transfer of Care (Signed)
Immediate Anesthesia Transfer of Care Note  Patient: George Dyer  Procedure(s) Performed: Procedure(s) (LRB) with comments: LAPAROSCOPIC INGUINAL HERNIA (Right) INSERTION OF MESH (Right)  Patient Location: PACU  Anesthesia Type:General  Level of Consciousness: awake and alert   Airway & Oxygen Therapy: Patient Spontanous Breathing and Patient connected to nasal cannula oxygen  Post-op Assessment: Report given to PACU RN and Post -op Vital signs reviewed and stable  Post vital signs: Reviewed and stable  Complications: No apparent anesthesia complications

## 2012-07-10 NOTE — Anesthesia Postprocedure Evaluation (Signed)
Anesthesia Post Note  Patient: George Dyer  Procedure(s) Performed: Procedure(s) (LRB): LAPAROSCOPIC INGUINAL HERNIA (Right) INSERTION OF MESH (Right)  Anesthesia type: General  Patient location: PACU  Post pain: Pain level controlled  Post assessment: Patient's Cardiovascular Status Stable  Last Vitals:  Filed Vitals:   07/10/12 1301  BP: 118/72  Pulse: 79  Temp: 36.9 C  Resp: 18    Post vital signs: Reviewed and stable  Level of consciousness: alert  Complications: No apparent anesthesia complications

## 2012-07-10 NOTE — Op Note (Signed)
Pre Operative Diagnosis: R recurrent IH   Post Operative Diagnosis: same   Procedure: laparoscopic right inguinal hernia repair with mesh  Surgeon: Dr. Axel Filler  Assistant: none  Anesthesia: GETA  EBL: 5cc  Complications: none  Counts: reported as correct x 2  Findings:  The patient had a moderate sized right indirect hernia  Indications for procedure:  The patient is a 72 year old male with a right-sided hernia for several months. He states that he had this fixed 15-20 yrs ago. Patient complained of symptomatology and pain radiating down his to his right groin. The patient decided to have this  electively repaired.  Details of the procedure:The patient was taken back to the operating room. The patient was placed in supine position with bilateral SCDs in place. After appropriate anitbiotics were confirmed, a time-out was confirmed and all facts were verified.  0.25% Marcaine was used to infiltrate the umbilical area. He was used to cut down the skin and blunt dissection was used to get the anterior fashion.  The anterior fascia was incised approximately 1 cm and the muscles were divided anteriorly. Blunt dissection was then used to create a space in the preperitoneal area. At this time a 10 mm camera was then introduced into the space and advanced the pubic tubercle and a 12 mm trocar was placed over this and insufflation was started.  At this time and space was created from medial to laterally the preperitoneal space. The hernia sac was identified. Dissection of the hernia sac was undertaken the vas deferens was identified and protected in all parts of the case.  Once the hernia sac was taken down to approximately the umbilicus to TET 20x20 Parietex mesh was cut into shape and introduced into the preperitoneal space.  The mesh was brought over the hernia defect and anchored into place and secured to Cooper's ligament with 4.0 there was a hernia stapler staples. It was anchored to the  anterior abdominal wall with 4.8 mm staples. The hernia sac was seen over the mesh. There was no staples placed laterally. The insufflation was evacuated. The trochars were removed. The anterior fascia was reapproximated using #1 Vicryl on a UR- 6.  The skin was reapproximated using 4-0 Monocryl subcuticular fashion the patient was awakened from general anesthesia and taken to recovery in stable condition.

## 2012-07-12 ENCOUNTER — Encounter (HOSPITAL_COMMUNITY): Payer: Self-pay | Admitting: General Surgery

## 2012-07-24 ENCOUNTER — Ambulatory Visit (INDEPENDENT_AMBULATORY_CARE_PROVIDER_SITE_OTHER): Payer: BC Managed Care – PPO | Admitting: General Surgery

## 2012-07-24 ENCOUNTER — Encounter (INDEPENDENT_AMBULATORY_CARE_PROVIDER_SITE_OTHER): Payer: Self-pay | Admitting: General Surgery

## 2012-07-24 VITALS — BP 118/64 | HR 97 | Temp 98.8°F | Ht 69.0 in | Wt 177.2 lb

## 2012-07-24 DIAGNOSIS — Z8719 Personal history of other diseases of the digestive system: Secondary | ICD-10-CM

## 2012-07-24 DIAGNOSIS — Z9889 Other specified postprocedural states: Secondary | ICD-10-CM

## 2012-07-24 MED ORDER — HYDROCODONE-ACETAMINOPHEN 10-325 MG PO TABS: 1.0000 | ORAL_TABLET | Freq: Every day | ORAL | Status: AC

## 2012-07-24 NOTE — Progress Notes (Signed)
Patient ID: George Dyer, male   DOB: [birth year], 72 y.o.   MRN: [id number] The patient is a 72 year old male status post laparoscopic  Right inguinal hernia repair with mesh. The patient has been doing well postoperatively he mainly complains of right foot pain the his right inguinal area is been relieved.   On exam: There is no hernia on palpation Wounds clean dry and intact   Assessment and plan: 72 year old male status post laparoscopic right inguinal hernia 1. Patient follow up when necessary 2. We discussed with the directions for another month. Marland Kitchen

## 2013-06-03 ENCOUNTER — Other Ambulatory Visit: Payer: Self-pay | Admitting: Cardiology

## 2013-07-10 ENCOUNTER — Encounter: Payer: Self-pay | Admitting: Cardiology

## 2013-07-10 ENCOUNTER — Ambulatory Visit (INDEPENDENT_AMBULATORY_CARE_PROVIDER_SITE_OTHER): Payer: BC Managed Care – PPO | Admitting: Cardiology

## 2013-07-10 VITALS — BP 126/69 | HR 73 | Ht 69.0 in | Wt 170.0 lb

## 2013-07-10 DIAGNOSIS — I871 Compression of vein: Secondary | ICD-10-CM

## 2013-07-10 DIAGNOSIS — I10 Essential (primary) hypertension: Secondary | ICD-10-CM

## 2013-07-10 LAB — BASIC METABOLIC PANEL WITH GFR
BUN: 12 mg/dL (ref 6–23)
CO2: 29 meq/L (ref 19–32)
Calcium: 9.3 mg/dL (ref 8.4–10.5)
Chloride: 104 meq/L (ref 96–112)
Creatinine, Ser: 1 mg/dL (ref 0.4–1.5)
GFR: 74.76 mL/min
Glucose, Bld: 91 mg/dL (ref 70–99)
Potassium: 4.1 meq/L (ref 3.5–5.1)
Sodium: 139 meq/L (ref 135–145)

## 2013-07-10 MED ORDER — LANSOPRAZOLE 30 MG PO CPDR: 30.0000 mg | DELAYED_RELEASE_CAPSULE | Freq: Every day | ORAL | Status: DC

## 2013-07-10 MED ORDER — LISINOPRIL-HYDROCHLOROTHIAZIDE 20-12.5 MG PO TABS: 1.0000 | ORAL_TABLET | Freq: Every day | ORAL | Status: DC

## 2013-07-10 NOTE — Addendum Note (Signed)
Addended by: Eulis Foster on: 07/10/2013 11:00 AM   Modules accepted: Orders

## 2013-07-10 NOTE — Addendum Note (Signed)
Addended by: Golden Hurter D on: 07/10/2013 10:52 AM   Modules accepted: Orders

## 2013-07-10 NOTE — Progress Notes (Signed)
George Dyer Date of Birth: 1940/08/26 Medical Record #[id number]  History of Present Illness: George Dyer is seen for yearly followup of hypertension. He reports that he has been doing very well. He denies any chest pain, shortness of breath, or palpitations. He has had no increase in edema. He had an inguinal hernia repair one year ago without complications.  Current Outpatient Prescriptions on File Prior to Visit  Medication Sig Dispense Refill  . acetaminophen (TYLENOL) 500 MG tablet Take 500 mg by mouth every 6 (six) hours as needed. For pain.      Marland Kitchen HYDROcodone-acetaminophen (NORCO) 10-325 MG per tablet Take 1 tablet by mouth at bedtime.  20 tablet  0  . oxyCODONE-acetaminophen (ROXICET) 5-325 MG per tablet Take 1 tablet by mouth every 4 (four) hours as needed for pain.  30 tablet  0   No current facility-administered medications on file prior to visit.    No Known Allergies  Past Medical History  Diagnosis Date  . Hypertension   . GERD (gastroesophageal reflux disease)   . 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 Lumbar disc disease   . ED (erectile dysfunction)     Past Surgical History  Procedure Laterality Date  . Tonsillectomy    . Breast biopsy      RIGHT BREAST  . Lumbar disc surgery    . US echocardiography  04/20/1993    EF 65%  . Inguinal hernia repair  07/10/2012    Procedure: LAPAROSCOPIC INGUINAL HERNIA;  Surgeon: Ralene Ok, MD;  Location: [facility];  Service: General;  Laterality: Right;  . Insertion of mesh  07/10/2012    Procedure: INSERTION OF MESH;  Surgeon: Ralene Ok, MD;  Location: [facility];  Service: General;  Laterality: Right;  . Inguinal hernia repair  07/10/12    RIH    History  Smoking status  . Former Smoker -- 0.25 packs/day for 6 years  . Types: Cigarettes  . Quit date: 03/25/1981  Smokeless tobacco  . Never Used    History  Alcohol Use No    Family History  Problem Relation Age of Onset  .  Hypertension Mother   . Heart attack Father   . Heart disease Father   . Cancer Maternal Aunt     breast  . Cancer Maternal Uncle     Review of Systems: As noted in history of present illness. He has chronic right foot pain.  All other systems were reviewed and are negative.  Physical Exam: BP 126/69  Pulse 73  Ht 5\' 9"  (1.753 m)  Wt 170 lb (77.111 kg)  BMI 25.09 kg/m2 WDWM in NAD.  The HEENT exam is unremarkable. The carotids are 2+ without bruits.  There is no thyromegaly.  He has marked venous engorgement in his upper extremities with significant collateral channels in his chest and abdomen.  The lungs are clear.   The heart exam reveals a regular rate with a normal S1 and S2.  There are no murmurs, gallops, or rubs.  The PMI is not displaced.   Abdominal exam reveals good bowel sounds.  There are no masses.  Exam of the legs reveal no clubbing, cyanosis, or edema.  The legs are without rashes.  The distal pulses are intact.  Cranial nerves II - XII are intact.  Motor and sensory functions are intact.  The gait is normal.  LABORATORY DATA: ECG today is normal.  Assessment / Plan: 1. Hypertension, well-controlled. Will  followup BMET today. Refilled lisinopril HCT.  2. 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 Asymptomatic.

## 2013-07-10 NOTE — Patient Instructions (Signed)
Continue your current therapy  I will see you in one year   

## 2014-06-10 ENCOUNTER — Ambulatory Visit (INDEPENDENT_AMBULATORY_CARE_PROVIDER_SITE_OTHER): Payer: Self-pay | Admitting: Cardiology

## 2014-06-10 ENCOUNTER — Encounter: Payer: Self-pay | Admitting: Cardiology

## 2014-06-10 VITALS — BP 100/64 | HR 82 | Ht 69.0 in | Wt 165.2 lb

## 2014-06-10 DIAGNOSIS — I10 Essential (primary) hypertension: Secondary | ICD-10-CM

## 2014-06-10 DIAGNOSIS — I871 Compression of vein: Secondary | ICD-10-CM

## 2014-06-10 LAB — BASIC METABOLIC PANEL
BUN: 18 mg/dL (ref 6–23)
CALCIUM: 9.7 mg/dL (ref 8.4–10.5)
CHLORIDE: 100 meq/L (ref 96–112)
CO2: 28 meq/L (ref 19–32)
Creat: 1.05 mg/dL (ref 0.50–1.35)
Glucose, Bld: 94 mg/dL (ref 70–99)
Potassium: 4.3 mEq/L (ref 3.5–5.3)
SODIUM: 137 meq/L (ref 135–145)

## 2014-06-10 MED ORDER — LISINOPRIL-HYDROCHLOROTHIAZIDE 20-12.5 MG PO TABS: 1.0000 | ORAL_TABLET | Freq: Every day | ORAL | Status: DC

## 2014-06-10 NOTE — Progress Notes (Signed)
George Dyer Date of Birth: January 27, 1941 Medical Record #[id number]  History of Present Illness: George Dyer is seen for yearly followup of hypertension. He also has a history of SVC syndrome.  He reports that he has been doing very well. He denies any chest pain, shortness of breath, or palpitations. He has had no increase in edema. He complains of chronic right foot pain related to nerve compression that did not improve with surgery.  Current Outpatient Prescriptions on File Prior to Visit  Medication Sig Dispense Refill  . acetaminophen (TYLENOL) 500 MG tablet Take 500 mg by mouth every 6 (six) hours as needed. For pain.    Marland Kitchen lansoprazole (PREVACID) 30 MG capsule Take 1 capsule (30 mg total) by mouth daily. 90 capsule 3  . oxyCODONE-acetaminophen (ROXICET) 5-325 MG per tablet Take 1 tablet by mouth every 4 (four) hours as needed for pain. 30 tablet 0   No current facility-administered medications on file prior to visit.    No Known Allergies  Past Medical History  Diagnosis Date  . Hypertension   . GERD (gastroesophageal reflux disease)   . 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 Lumbar disc disease   . ED (erectile dysfunction)     Past Surgical History  Procedure Laterality Date  . Tonsillectomy    . Breast biopsy      RIGHT BREAST  . Lumbar disc surgery    . US echocardiography  04/20/1993    EF 65%  . Inguinal hernia repair  07/10/2012    Procedure: LAPAROSCOPIC INGUINAL HERNIA;  Surgeon: Ralene Ok, MD;  Location: [facility];  Service: General;  Laterality: Right;  . Insertion of mesh  07/10/2012    Procedure: INSERTION OF MESH;  Surgeon: Ralene Ok, MD;  Location: [facility];  Service: General;  Laterality: Right;  . Inguinal hernia repair  07/10/12    RIH    History  Smoking status  . Former Smoker -- 0.25 packs/day for 6 years  . Types: Cigarettes  . Quit date: 03/25/1981  Smokeless tobacco  . Never Used    History  Alcohol Use No     Family History  Problem Relation Age of Onset  . Hypertension Mother   . Heart attack Father   . Heart disease Father   . Cancer Maternal Aunt     breast  . Cancer Maternal Uncle     Review of Systems: As noted in history of present illness. He has chronic right foot pain.  All other systems were reviewed and are negative.  Physical Exam: BP 100/64 mmHg  Pulse 82  Ht 5\' 9"  (1.753 m)  Wt 165 lb 4 oz (74.957 kg)  BMI 24.39 kg/m2 WDWM in NAD.  The HEENT exam is unremarkable. The carotids are 2+ without bruits.  There is no thyromegaly.  He has marked venous engorgement in his upper extremities with significant collateral channels in his chest and abdomen.  The lungs are clear.   The heart exam reveals a regular rate with a normal S1 and S2.  There are no murmurs, gallops, or rubs.  The PMI is not displaced.   Abdominal exam reveals good bowel sounds.  There are no masses.  Exam of the legs reveal no edema.    The distal pulses are intact.  Cranial nerves II - XII are intact.    The gait is normal.  LABORATORY DATA:   Assessment / Plan: 1. Hypertension, well-controlled. Will followup BMET  today. Refilled lisinopril HCT.  2. 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 Asymptomatic.  Follow up in one year.

## 2014-06-10 NOTE — Patient Instructions (Signed)
We will check your kidney function today  I will see you in one year.

## 2014-06-14 ENCOUNTER — Ambulatory Visit: Payer: Self-pay | Admitting: Cardiology

## 2015-06-04 ENCOUNTER — Other Ambulatory Visit: Payer: Self-pay | Admitting: *Deleted

## 2015-06-04 DIAGNOSIS — I10 Essential (primary) hypertension: Secondary | ICD-10-CM

## 2015-06-04 MED ORDER — LISINOPRIL-HYDROCHLOROTHIAZIDE 20-12.5 MG PO TABS: 1.0000 | ORAL_TABLET | Freq: Every day | ORAL | Status: DC

## 2015-06-04 NOTE — Telephone Encounter (Signed)
called for a 30 day just to get pt to his 06/27/15 appt, refill sent

## 2015-06-27 ENCOUNTER — Ambulatory Visit (INDEPENDENT_AMBULATORY_CARE_PROVIDER_SITE_OTHER): Payer: Commercial Managed Care - HMO | Admitting: Cardiology

## 2015-06-27 ENCOUNTER — Encounter: Payer: Self-pay | Admitting: Cardiology

## 2015-06-27 VITALS — BP 102/66 | HR 67 | Ht 69.0 in | Wt 161.9 lb

## 2015-06-27 DIAGNOSIS — I10 Essential (primary) hypertension: Secondary | ICD-10-CM | POA: Diagnosis not present

## 2015-06-27 LAB — BASIC METABOLIC PANEL
BUN: 16 mg/dL (ref 7–25)
CHLORIDE: 100 mmol/L (ref 98–110)
CO2: 30 mmol/L (ref 20–31)
Calcium: 9.5 mg/dL (ref 8.6–10.3)
Creat: 1.06 mg/dL (ref 0.70–1.18)
Glucose, Bld: 92 mg/dL (ref 65–99)
POTASSIUM: 4.4 mmol/L (ref 3.5–5.3)
Sodium: 137 mmol/L (ref 135–146)

## 2015-06-27 LAB — LIPID PANEL
CHOL/HDL RATIO: 4.9 ratio (ref <=5.0)
Cholesterol: 190 mg/dL (ref 125–200)
HDL: 39 mg/dL — AB (ref >=40)
LDL CALC: 118 mg/dL (ref <130)
TRIGLYCERIDES: 163 mg/dL — AB (ref <150)
VLDL: 33 mg/dL — AB (ref <30)

## 2015-06-27 LAB — HEPATIC FUNCTION PANEL
ALT: 13 U/L (ref 9–46)
AST: 12 U/L (ref 10–35)
Albumin: 4.1 g/dL (ref 3.6–5.1)
Alkaline Phosphatase: 65 U/L (ref 40–115)
BILIRUBIN TOTAL: 0.6 mg/dL (ref 0.2–1.2)
Bilirubin, Direct: 0.1 mg/dL (ref <=0.2)
Indirect Bilirubin: 0.5 mg/dL (ref 0.2–1.2)
TOTAL PROTEIN: 6.9 g/dL (ref 6.1–8.1)

## 2015-06-27 MED ORDER — LISINOPRIL-HYDROCHLOROTHIAZIDE 20-12.5 MG PO TABS: 1.0000 | ORAL_TABLET | Freq: Every day | ORAL | Status: DC

## 2015-06-27 NOTE — Patient Instructions (Signed)
We will check blood work today  Continue your current therapy  I will see  You in one year

## 2015-06-27 NOTE — Progress Notes (Signed)
George Dyer Date of Birth: Mar 31, 1941 Medical Record [id number]  History of Present Illness: George Dyer is seen for yearly followup of hypertension. He also has a history of SVC syndrome.  He reports that he has been doing very well. He denies any chest pain, shortness of breath, or palpitations. He has had no increase in edema. He complains of chronic right foot pain related to nerve compression that did not improve with surgery. He stays very active.  Current Outpatient Prescriptions on File Prior to Visit  Medication Sig Dispense Refill  . acetaminophen (TYLENOL) 500 MG tablet Take 500 mg by mouth every 6 (six) hours as needed. For pain.    Marland Kitchen lansoprazole (PREVACID) 30 MG capsule Take 1 capsule (30 mg total) by mouth daily. 90 capsule 3   No current facility-administered medications on file prior to visit.    No Known Allergies  Past Medical History  Diagnosis Date  . Hypertension   . GERD (gastroesophageal reflux disease)   . 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 Lumbar disc disease   . ED (erectile dysfunction)     Past Surgical History  Procedure Laterality Date  . Tonsillectomy    . Breast biopsy      RIGHT BREAST  . Lumbar disc surgery    . US echocardiography  04/20/1993    EF 65%  . Inguinal hernia repair  07/10/2012    Procedure: LAPAROSCOPIC INGUINAL HERNIA;  Surgeon: Ralene Ok, MD;  Location: [facility];  Service: General;  Laterality: Right;  . Insertion of mesh  07/10/2012    Procedure: INSERTION OF MESH;  Surgeon: Ralene Ok, MD;  Location: [facility];  Service: General;  Laterality: Right;  . Inguinal hernia repair  07/10/12    RIH    History  Smoking status  . Former Smoker -- 0.25 packs/day for 6 years  . Types: Cigarettes  . Quit date: 03/25/1981  Smokeless tobacco  . Never Used    History  Alcohol Use No    Family History  Problem Relation Age of Onset  . Hypertension Mother   . Heart attack Father   . Heart  disease Father   . Cancer Maternal Aunt     breast  . Cancer Maternal Uncle     Review of Systems: As noted in history of present illness. He has chronic right foot pain.  All other systems were reviewed and are negative.  Physical Exam: BP 102/66 mmHg  Pulse 67  Ht 5\' 9"  (1.753 m)  Wt 73.437 kg (161 lb 14.4 oz)  BMI 23.90 kg/m2  SpO2 97% WDWM in NAD.  The HEENT exam is unremarkable. The carotids are 2+ without bruits.  There is no thyromegaly.  He has marked venous engorgement in his upper extremities with significant collateral channels in his chest and abdomen.  The lungs are clear.   The heart exam reveals a regular rate with a normal S1 and S2.  There are no murmurs, gallops, or rubs.  The PMI is not displaced.   Abdominal exam reveals good bowel sounds.  There are no masses.  Exam of the legs reveal no edema.    The distal pulses are intact.  Cranial nerves II - XII are intact.    The gait is normal.  LABORATORY DATA:  Lab Results  Component Value Date   WBC 6.8 07/06/2012   HGB 14.2 07/06/2012   HCT 43.7 07/06/2012   PLT 291 07/06/2012  GLUCOSE 94 06/10/2014   CHOL 213* 04/14/2012   TRIG 178.0* 04/14/2012   HDL 38.20* 04/14/2012   LDLDIRECT 129.0 04/14/2012   ALT 14 04/14/2012   AST 13 04/14/2012   NA 137 06/10/2014   K 4.3 06/10/2014   CL 100 06/10/2014   CREATININE 1.05 06/10/2014   BUN 18 06/10/2014   CO2 28 06/10/2014    Assessment / Plan: 1. Hypertension, well-controlled. Will followup fasting labs today. Refilled lisinopril HCT.  2. 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 Asymptomatic.  Follow up in one year.

## 2015-08-07 ENCOUNTER — Other Ambulatory Visit: Payer: Self-pay | Admitting: Cardiology

## 2015-11-10 DIAGNOSIS — S61210A Laceration without foreign body of right index finger without damage to nail, initial encounter: Secondary | ICD-10-CM | POA: Diagnosis not present

## 2016-01-03 DIAGNOSIS — S42292A Other displaced fracture of upper end of left humerus, initial encounter for closed fracture: Secondary | ICD-10-CM | POA: Diagnosis not present

## 2016-01-03 DIAGNOSIS — S59902A Unspecified injury of left elbow, initial encounter: Secondary | ICD-10-CM | POA: Diagnosis not present

## 2016-01-03 DIAGNOSIS — S42492A Other displaced fracture of lower end of left humerus, initial encounter for closed fracture: Secondary | ICD-10-CM | POA: Diagnosis not present

## 2016-01-04 DIAGNOSIS — M79602 Pain in left arm: Secondary | ICD-10-CM | POA: Diagnosis not present

## 2016-01-04 DIAGNOSIS — I10 Essential (primary) hypertension: Secondary | ICD-10-CM | POA: Diagnosis not present

## 2016-01-04 DIAGNOSIS — S42332A Displaced oblique fracture of shaft of humerus, left arm, initial encounter for closed fracture: Secondary | ICD-10-CM | POA: Diagnosis not present

## 2016-01-04 DIAGNOSIS — S42352A Displaced comminuted fracture of shaft of humerus, left arm, initial encounter for closed fracture: Secondary | ICD-10-CM | POA: Diagnosis not present

## 2016-01-05 DIAGNOSIS — S42302A Unspecified fracture of shaft of humerus, left arm, initial encounter for closed fracture: Secondary | ICD-10-CM | POA: Diagnosis not present

## 2016-01-05 DIAGNOSIS — S4982XA Other specified injuries of left shoulder and upper arm, initial encounter: Secondary | ICD-10-CM | POA: Diagnosis not present

## 2016-01-09 DIAGNOSIS — S42322A Displaced transverse fracture of shaft of humerus, left arm, initial encounter for closed fracture: Secondary | ICD-10-CM | POA: Diagnosis not present

## 2016-01-12 ENCOUNTER — Other Ambulatory Visit: Payer: Self-pay | Admitting: Orthopedic Surgery

## 2016-01-12 ENCOUNTER — Encounter (HOSPITAL_COMMUNITY): Payer: Self-pay | Admitting: *Deleted

## 2016-01-12 NOTE — Progress Notes (Signed)
Pt requested that I speak with his wife for pre-op call. She states pt has not had any recent chest pain or sob. He is followed by Dr. Peter Martinique for hypertension and SVC syndrome. Last office visit was 06/27/15 with instructions to see Dr. Martinique in a year.

## 2016-01-13 ENCOUNTER — Encounter (HOSPITAL_COMMUNITY): Payer: Self-pay | Admitting: Urology

## 2016-01-13 ENCOUNTER — Ambulatory Visit (HOSPITAL_COMMUNITY): Payer: Commercial Managed Care - HMO | Admitting: Anesthesiology

## 2016-01-13 ENCOUNTER — Observation Stay (HOSPITAL_COMMUNITY)
Admission: RE | Admit: 2016-01-13 | Discharge: 2016-01-14 | Disposition: A | Discharge status: Home / Self Care | Payer: Commercial Managed Care - HMO | Source: Ambulatory Visit | Attending: Orthopedic Surgery | Admitting: Orthopedic Surgery

## 2016-01-13 ENCOUNTER — Encounter (HOSPITAL_COMMUNITY)
Admission: RE | Disposition: A | Discharge status: Home / Self Care | Payer: Self-pay | Source: Ambulatory Visit | Attending: Orthopedic Surgery

## 2016-01-13 DIAGNOSIS — Z87891 Personal history of nicotine dependence: Secondary | ICD-10-CM | POA: Diagnosis not present

## 2016-01-13 DIAGNOSIS — S42322A Displaced transverse fracture of shaft of humerus, left arm, initial encounter for closed fracture: Secondary | ICD-10-CM | POA: Diagnosis not present

## 2016-01-13 DIAGNOSIS — G8918 Other acute postprocedural pain: Secondary | ICD-10-CM | POA: Diagnosis not present

## 2016-01-13 DIAGNOSIS — K219 Gastro-esophageal reflux disease without esophagitis: Secondary | ICD-10-CM | POA: Diagnosis not present

## 2016-01-13 DIAGNOSIS — S42202A Unspecified fracture of upper end of left humerus, initial encounter for closed fracture: Principal | ICD-10-CM | POA: Diagnosis present

## 2016-01-13 DIAGNOSIS — K5900 Constipation, unspecified: Secondary | ICD-10-CM | POA: Diagnosis not present

## 2016-01-13 DIAGNOSIS — X58XXXA Exposure to other specified factors, initial encounter: Secondary | ICD-10-CM | POA: Insufficient documentation

## 2016-01-13 DIAGNOSIS — Z79899 Other long term (current) drug therapy: Secondary | ICD-10-CM | POA: Insufficient documentation

## 2016-01-13 DIAGNOSIS — I10 Essential (primary) hypertension: Secondary | ICD-10-CM | POA: Diagnosis not present

## 2016-01-13 HISTORY — DX: Pneumonia, unspecified organism: J18.9

## 2016-01-13 HISTORY — DX: Mononeuropathy, unspecified: G58.9

## 2016-01-13 HISTORY — DX: Unspecified osteoarthritis, unspecified site: M19.90

## 2016-01-13 HISTORY — DX: Calculus of kidney: N20.0

## 2016-01-13 HISTORY — DX: Constipation, unspecified: K59.00

## 2016-01-13 HISTORY — DX: Headache, unspecified: R51.9

## 2016-01-13 HISTORY — PX: ORIF HUMERUS FRACTURE: SHX2126

## 2016-01-13 HISTORY — DX: Headache: R51

## 2016-01-13 LAB — BASIC METABOLIC PANEL
Anion gap: 7 (ref 5–15)
BUN: 16 mg/dL (ref 6–20)
CALCIUM: 9.4 mg/dL (ref 8.9–10.3)
CHLORIDE: 104 mmol/L (ref 101–111)
CO2: 25 mmol/L (ref 22–32)
CREATININE: 1.07 mg/dL (ref 0.61–1.24)
GFR calc Af Amer: >60 mL/min (ref >60)
GFR calc non Af Amer: >60 mL/min (ref >60)
GLUCOSE: 91 mg/dL (ref 65–99)
Potassium: 4 mmol/L (ref 3.5–5.1)
Sodium: 136 mmol/L (ref 135–145)

## 2016-01-13 LAB — ABO/RH: ABO/RH(D): O POS

## 2016-01-13 LAB — CBC
HCT: 34.9 % — ABNORMAL LOW (ref 39.0–52.0)
Hemoglobin: 10.9 g/dL — ABNORMAL LOW (ref 13.0–17.0)
MCH: 27.8 pg (ref 26.0–34.0)
MCHC: 31.2 g/dL (ref 30.0–36.0)
MCV: 89 fL (ref 78.0–100.0)
PLATELETS: 376 10*3/uL (ref 150–400)
RBC: 3.92 MIL/uL — ABNORMAL LOW (ref 4.22–5.81)
RDW: 14 % (ref 11.5–15.5)
WBC: 7.2 10*3/uL (ref 4.0–10.5)

## 2016-01-13 LAB — TYPE AND SCREEN
ABO/RH(D): O POS
Antibody Screen: NEGATIVE

## 2016-01-13 LAB — SURGICAL PCR SCREEN
MRSA, PCR: NEGATIVE
STAPHYLOCOCCUS AUREUS: NEGATIVE

## 2016-01-13 SURGERY — OPEN REDUCTION INTERNAL FIXATION (ORIF) HUMERAL SHAFT FRACTURE
Anesthesia: General | Site: Arm Upper | Laterality: Left

## 2016-01-13 MED ORDER — LIDOCAINE 2% (20 MG/ML) 5 ML SYRINGE
INTRAMUSCULAR | Status: AC
  Filled 2016-01-13: qty 5

## 2016-01-13 MED ORDER — PHENYLEPHRINE HCL 10 MG/ML IJ SOLN
INTRAMUSCULAR | Status: DC | PRN
  Administered 2016-01-13 (×4): 40 ug via INTRAVENOUS

## 2016-01-13 MED ORDER — LISINOPRIL 20 MG PO TABS
20.0000 mg | ORAL_TABLET | Freq: Every day | ORAL | Status: DC
  Filled 2016-01-13: qty 1

## 2016-01-13 MED ORDER — LISINOPRIL-HYDROCHLOROTHIAZIDE 20-12.5 MG PO TABS: 1.0000 | ORAL_TABLET | Freq: Every day | ORAL | Status: DC

## 2016-01-13 MED ORDER — PROMETHAZINE HCL 25 MG PO TABS: 25.0000 mg | ORAL_TABLET | ORAL | Status: DC | PRN

## 2016-01-13 MED ORDER — HYDROMORPHONE HCL 1 MG/ML IJ SOLN
0.2500 mg | INTRAMUSCULAR | Status: DC | PRN
  Administered 2016-01-14: 0.5 mg via INTRAVENOUS
  Filled 2016-01-13: qty 1

## 2016-01-13 MED ORDER — 0.9 % SODIUM CHLORIDE (POUR BTL) OPTIME
TOPICAL | Status: DC | PRN
  Administered 2016-01-13: 1000 mL

## 2016-01-13 MED ORDER — PROPOFOL 10 MG/ML IV BOLUS
INTRAVENOUS | Status: DC | PRN
  Administered 2016-01-13: 30 mg via INTRAVENOUS
  Administered 2016-01-13: 150 mg via INTRAVENOUS

## 2016-01-13 MED ORDER — ONDANSETRON HCL 4 MG/2ML IJ SOLN
INTRAMUSCULAR | Status: DC | PRN
  Administered 2016-01-13: 4 mg via INTRAVENOUS

## 2016-01-13 MED ORDER — FENTANYL CITRATE (PF) 100 MCG/2ML IJ SOLN
INTRAMUSCULAR | Status: DC | PRN
Start: 2016-01-13 — End: 2016-01-13
  Administered 2016-01-13 (×2): 50 ug via INTRAVENOUS
  Administered 2016-01-13: 100 ug via INTRAVENOUS

## 2016-01-13 MED ORDER — PROMETHAZINE HCL 25 MG RE SUPP: 25.0000 mg | Freq: Four times a day (QID) | RECTAL | Status: DC | PRN

## 2016-01-13 MED ORDER — LIDOCAINE HCL (CARDIAC) 20 MG/ML IV SOLN
INTRAVENOUS | Status: DC | PRN
  Administered 2016-01-13: 60 mg via INTRAVENOUS

## 2016-01-13 MED ORDER — ROCURONIUM BROMIDE 10 MG/ML (PF) SYRINGE
PREFILLED_SYRINGE | INTRAVENOUS | Status: AC
  Filled 2016-01-13: qty 10

## 2016-01-13 MED ORDER — BUPIVACAINE HCL (PF) 0.25 % IJ SOLN
INTRAMUSCULAR | Status: AC
  Filled 2016-01-13: qty 30

## 2016-01-13 MED ORDER — ONDANSETRON HCL 4 MG/2ML IJ SOLN
INTRAMUSCULAR | Status: AC
  Filled 2016-01-13: qty 2

## 2016-01-13 MED ORDER — PHENYLEPHRINE 40 MCG/ML (10ML) SYRINGE FOR IV PUSH (FOR BLOOD PRESSURE SUPPORT)
PREFILLED_SYRINGE | INTRAVENOUS | Status: AC
  Filled 2016-01-13: qty 10

## 2016-01-13 MED ORDER — METHOCARBAMOL 1000 MG/10ML IJ SOLN
500.0000 mg | Freq: Four times a day (QID) | INTRAVENOUS | Status: DC | PRN
  Filled 2016-01-13: qty 5

## 2016-01-13 MED ORDER — CEFAZOLIN IN D5W 1 GM/50ML IV SOLN
INTRAVENOUS | Status: AC
  Filled 2016-01-13: qty 50

## 2016-01-13 MED ORDER — ALPRAZOLAM 0.5 MG PO TABS: 0.5000 mg | ORAL_TABLET | Freq: Four times a day (QID) | ORAL | Status: DC | PRN

## 2016-01-13 MED ORDER — METHOCARBAMOL 500 MG PO TABS
ORAL_TABLET | ORAL | Status: AC
  Filled 2016-01-13: qty 1

## 2016-01-13 MED ORDER — SUGAMMADEX SODIUM 200 MG/2ML IV SOLN
INTRAVENOUS | Status: AC
  Filled 2016-01-13: qty 2

## 2016-01-13 MED ORDER — CEFAZOLIN SODIUM-DEXTROSE 2-4 GM/100ML-% IV SOLN
INTRAVENOUS | Status: AC
  Filled 2016-01-13: qty 100

## 2016-01-13 MED ORDER — FENTANYL CITRATE (PF) 250 MCG/5ML IJ SOLN
INTRAMUSCULAR | Status: AC
  Filled 2016-01-13: qty 5

## 2016-01-13 MED ORDER — SUGAMMADEX SODIUM 500 MG/5ML IV SOLN
INTRAVENOUS | Status: DC | PRN
  Administered 2016-01-13: 200 mg via INTRAVENOUS

## 2016-01-13 MED ORDER — ONDANSETRON HCL 4 MG/2ML IJ SOLN
4.0000 mg | Freq: Four times a day (QID) | INTRAMUSCULAR | Status: DC | PRN
  Administered 2016-01-13: 4 mg via INTRAVENOUS

## 2016-01-13 MED ORDER — FENTANYL CITRATE (PF) 100 MCG/2ML IJ SOLN
INTRAMUSCULAR | Status: AC
  Filled 2016-01-13: qty 2

## 2016-01-13 MED ORDER — HYDROCODONE-ACETAMINOPHEN 7.5-325 MG PO TABS: 1.0000 | ORAL_TABLET | Freq: Once | ORAL | Status: DC | PRN

## 2016-01-13 MED ORDER — CHLORHEXIDINE GLUCONATE 4 % EX LIQD: 60.0000 mL | Freq: Once | CUTANEOUS | Status: DC

## 2016-01-13 MED ORDER — PHENYLEPHRINE HCL 10 MG/ML IJ SOLN
INTRAVENOUS | Status: DC | PRN
  Administered 2016-01-13: 25 ug/min via INTRAVENOUS

## 2016-01-13 MED ORDER — ROCURONIUM BROMIDE 100 MG/10ML IV SOLN
INTRAVENOUS | Status: DC | PRN
  Administered 2016-01-13: 50 mg via INTRAVENOUS
  Administered 2016-01-13: 10 mg via INTRAVENOUS

## 2016-01-13 MED ORDER — EPHEDRINE 5 MG/ML INJ
INTRAVENOUS | Status: AC
Start: 2016-01-13 — End: 2016-01-13
  Filled 2016-01-13: qty 10

## 2016-01-13 MED ORDER — SUCCINYLCHOLINE CHLORIDE 200 MG/10ML IV SOSY
PREFILLED_SYRINGE | INTRAVENOUS | Status: AC
  Filled 2016-01-13: qty 10

## 2016-01-13 MED ORDER — PANTOPRAZOLE SODIUM 40 MG PO TBEC: 40.0000 mg | DELAYED_RELEASE_TABLET | Freq: Two times a day (BID) | ORAL | Status: DC | PRN

## 2016-01-13 MED ORDER — CEFAZOLIN SODIUM-DEXTROSE 2-4 GM/100ML-% IV SOLN
2.0000 g | INTRAVENOUS | Status: AC
  Administered 2016-01-13: 2 g via INTRAVENOUS

## 2016-01-13 MED ORDER — HYDROCODONE-ACETAMINOPHEN 7.5-325 MG PO TABS
1.0000 | ORAL_TABLET | Freq: Once | ORAL | Status: AC | PRN
  Administered 2016-01-14: 1 via ORAL
  Filled 2016-01-13 (×2): qty 1

## 2016-01-13 MED ORDER — METHOCARBAMOL 500 MG PO TABS
500.0000 mg | ORAL_TABLET | Freq: Four times a day (QID) | ORAL | Status: DC | PRN
  Administered 2016-01-14: 500 mg via ORAL
  Filled 2016-01-13: qty 1

## 2016-01-13 MED ORDER — ALBUMIN HUMAN 5 % IV SOLN
INTRAVENOUS | Status: DC | PRN
  Administered 2016-01-13: 18:00:00 via INTRAVENOUS

## 2016-01-13 MED ORDER — LACTATED RINGERS IV SOLN
INTRAVENOUS | Status: DC
  Administered 2016-01-13 (×3): via INTRAVENOUS

## 2016-01-13 MED ORDER — HYDROMORPHONE HCL 1 MG/ML IJ SOLN
0.2500 mg | INTRAMUSCULAR | Status: DC | PRN
  Administered 2016-01-13 (×2): 0.5 mg via INTRAVENOUS

## 2016-01-13 MED ORDER — CEFAZOLIN IN D5W 1 GM/50ML IV SOLN
1.0000 g | INTRAVENOUS | Status: AC
  Administered 2016-01-13: 1 g via INTRAVENOUS
  Filled 2016-01-13: qty 50

## 2016-01-13 MED ORDER — TAPENTADOL HCL 50 MG PO TABS
50.0000 mg | ORAL_TABLET | ORAL | Status: DC | PRN
  Administered 2016-01-14: 50 mg via ORAL
  Filled 2016-01-13: qty 1

## 2016-01-13 MED ORDER — SODIUM CHLORIDE 0.45 % IV SOLN
INTRAVENOUS | Status: DC
  Administered 2016-01-14: 06:00:00 via INTRAVENOUS

## 2016-01-13 MED ORDER — HYDROMORPHONE HCL 1 MG/ML IJ SOLN
INTRAMUSCULAR | Status: AC
  Filled 2016-01-13: qty 1

## 2016-01-13 MED ORDER — MORPHINE SULFATE (PF) 2 MG/ML IV SOLN
1.0000 mg | INTRAVENOUS | Status: DC | PRN
  Administered 2016-01-14 (×2): 1 mg via INTRAVENOUS
  Filled 2016-01-13 (×2): qty 1

## 2016-01-13 MED ORDER — HYDROCHLOROTHIAZIDE 12.5 MG PO CAPS
12.5000 mg | ORAL_CAPSULE | Freq: Every day | ORAL | Status: DC
  Filled 2016-01-13: qty 1

## 2016-01-13 MED ORDER — MIDAZOLAM HCL 2 MG/2ML IJ SOLN
INTRAMUSCULAR | Status: AC
  Filled 2016-01-13: qty 2

## 2016-01-13 MED ORDER — CEFAZOLIN SODIUM-DEXTROSE 2-4 GM/100ML-% IV SOLN
2.0000 g | Freq: Three times a day (TID) | INTRAVENOUS | Status: DC
  Administered 2016-01-14 (×2): 2 g via INTRAVENOUS
  Filled 2016-01-13 (×3): qty 100

## 2016-01-13 MED ORDER — SENNA 8.6 MG PO TABS
1.0000 | ORAL_TABLET | Freq: Two times a day (BID) | ORAL | Status: DC
  Administered 2016-01-14 (×2): 8.6 mg via ORAL
  Filled 2016-01-13 (×2): qty 1

## 2016-01-13 MED ORDER — BUPIVACAINE-EPINEPHRINE (PF) 0.5% -1:200000 IJ SOLN
INTRAMUSCULAR | Status: DC | PRN
  Administered 2016-01-13: 25 mL via PERINEURAL

## 2016-01-13 SURGICAL SUPPLY — 73 items
BANDAGE ACE 3X5.8 VEL STRL LF (GAUZE/BANDAGES/DRESSINGS) ×3 IMPLANT
BANDAGE ACE 4X5 VEL STRL LF (GAUZE/BANDAGES/DRESSINGS) ×6 IMPLANT
BANDAGE ELASTIC 3 VELCRO ST LF (GAUZE/BANDAGES/DRESSINGS) IMPLANT
BANDAGE ELASTIC 4 VELCRO ST LF (GAUZE/BANDAGES/DRESSINGS) ×3 IMPLANT
BIT DRILL QC 3.3X195 (BIT) ×6 IMPLANT
BNDG CMPR 9X4 STRL LF SNTH (GAUZE/BANDAGES/DRESSINGS) ×1
BNDG CONFORM 3 STRL LF (GAUZE/BANDAGES/DRESSINGS) ×3 IMPLANT
BNDG ESMARK 4X9 LF (GAUZE/BANDAGES/DRESSINGS) ×3 IMPLANT
BNDG GAUZE ELAST 4 BULKY (GAUZE/BANDAGES/DRESSINGS) ×6 IMPLANT
CAP LOCK NCB (Cap) ×24 IMPLANT
CORDS BIPOLAR (ELECTRODE) ×3 IMPLANT
COVER MAYO STAND STRL (DRAPES) ×3 IMPLANT
COVER SURGICAL LIGHT HANDLE (MISCELLANEOUS) ×6 IMPLANT
CUFF TOURNIQUET SINGLE 18IN (TOURNIQUET CUFF) ×3 IMPLANT
CUFF TOURNIQUET SINGLE 24IN (TOURNIQUET CUFF) IMPLANT
DRAPE IMP U-DRAPE 54X76 (DRAPES) ×3 IMPLANT
DRAPE INCISE IOBAN 66X45 STRL (DRAPES) ×9 IMPLANT
DRAPE OEC MINIVIEW 54X84 (DRAPES) ×3 IMPLANT
DRSG MEPILEX BORDER 4X12 (GAUZE/BANDAGES/DRESSINGS) ×3 IMPLANT
DRSG MEPITEL 4X7.2 (GAUZE/BANDAGES/DRESSINGS) ×3 IMPLANT
EVACUATOR 1/8 PVC DRAIN (DRAIN) ×3 IMPLANT
GAUZE SPONGE 4X4 12PLY STRL (GAUZE/BANDAGES/DRESSINGS) IMPLANT
GAUZE SPONGE 4X4 16PLY XRAY LF (GAUZE/BANDAGES/DRESSINGS) ×3 IMPLANT
GAUZE XEROFORM 1X8 LF (GAUZE/BANDAGES/DRESSINGS) IMPLANT
GLOVE BIOGEL M 8.0 STRL (GLOVE) ×3 IMPLANT
GLOVE BIOGEL PI IND STRL 8 (GLOVE) ×2 IMPLANT
GLOVE BIOGEL PI INDICATOR 8 (GLOVE) ×4
GLOVE SS BIOGEL STRL SZ 8 (GLOVE) ×2 IMPLANT
GLOVE SUPERSENSE BIOGEL SZ 8 (GLOVE) ×4
GLOVE SURG SS PI 7.5 STRL IVOR (GLOVE) ×3 IMPLANT
GOWN STRL REUS W/ TWL LRG LVL3 (GOWN DISPOSABLE) ×2 IMPLANT
GOWN STRL REUS W/ TWL XL LVL3 (GOWN DISPOSABLE) ×3 IMPLANT
GOWN STRL REUS W/TWL LRG LVL3 (GOWN DISPOSABLE) ×4
GOWN STRL REUS W/TWL XL LVL3 (GOWN DISPOSABLE) ×6
KIT BASIN OR (CUSTOM PROCEDURE TRAY) ×3 IMPLANT
KIT ROOM TURNOVER OR (KITS) ×3 IMPLANT
MANIFOLD NEPTUNE II (INSTRUMENTS) ×3 IMPLANT
NEEDLE HYPO 25GX1X1/2 BEV (NEEDLE) IMPLANT
NS IRRIG 1000ML POUR BTL (IV SOLUTION) ×3 IMPLANT
PACK ORTHO EXTREMITY (CUSTOM PROCEDURE TRAY) ×3 IMPLANT
PACK UNIVERSAL I (CUSTOM PROCEDURE TRAY) ×3 IMPLANT
PAD ARMBOARD 7.5X6 YLW CONV (MISCELLANEOUS) ×6 IMPLANT
PAD CAST 4YDX4 CTTN HI CHSV (CAST SUPPLIES) IMPLANT
PADDING CAST ABS 4INX4YD NS (CAST SUPPLIES) ×8
PADDING CAST ABS COTTON 4X4 ST (CAST SUPPLIES) ×4 IMPLANT
PADDING CAST COTTON 4X4 STRL (CAST SUPPLIES)
PLATE FRACTURE NCB PA 174 H12 (Plate) ×2 IMPLANT
SCREW BONE TI 4X30MM (Screw) ×3 IMPLANT
SCREW NCB 4.0 28MM (Screw) ×6 IMPLANT
SCREW NCB 4.0 32MM (Screw) ×6 IMPLANT
SCREW NCB 4.0MX30M (Screw) ×6 IMPLANT
SCREW NCB 4.0X26MM (Screw) ×9 IMPLANT
SLING ARM FOAM STRAP XLG (SOFTGOODS) ×3 IMPLANT
SOLUTION BETADINE 4OZ (MISCELLANEOUS) ×3 IMPLANT
SPLINT FIBERGLASS 3X35 (CAST SUPPLIES) ×3 IMPLANT
SPLINT FIBERGLASS 4X30 (CAST SUPPLIES) ×3 IMPLANT
SPONGE LAP 18X18 X RAY DECT (DISPOSABLE) ×6 IMPLANT
SPONGE SCRUB IODOPHOR (GAUZE/BANDAGES/DRESSINGS) ×6 IMPLANT
STAPLER VISISTAT (STAPLE) ×3 IMPLANT
SUCTION FRAZIER HANDLE 10FR (MISCELLANEOUS)
SUCTION TUBE FRAZIER 10FR DISP (MISCELLANEOUS) IMPLANT
SUT MERSILENE 4 0 P 3 (SUTURE) IMPLANT
SUT PROLENE 4 0 PS 2 18 (SUTURE) ×3 IMPLANT
SUT VIC AB 2-0 CT1 27 (SUTURE)
SUT VIC AB 2-0 CT1 TAPERPNT 27 (SUTURE) IMPLANT
SUT VIC AB 3-0 FS2 27 (SUTURE) ×9 IMPLANT
SYR CONTROL 10ML LL (SYRINGE) IMPLANT
TOWEL OR 17X24 6PK STRL BLUE (TOWEL DISPOSABLE) ×3 IMPLANT
TOWEL OR 17X26 10 PK STRL BLUE (TOWEL DISPOSABLE) ×6 IMPLANT
TUBE CONNECTING 12'X1/4 (SUCTIONS)
TUBE CONNECTING 12X1/4 (SUCTIONS) IMPLANT
UNDERPAD 30X30 INCONTINENT (UNDERPADS AND DIAPERS) ×3 IMPLANT
WATER STERILE IRR 1000ML POUR (IV SOLUTION) ×3 IMPLANT

## 2016-01-13 NOTE — Progress Notes (Signed)
Pt states he had a hamburger bun and a cup of coffee with cream and sugar at 7AM. Dr. Ermalene Postin notified.

## 2016-01-13 NOTE — Anesthesia Procedure Notes (Signed)
Procedure Name: Intubation Date/Time: 01/13/2016 5:19 PM Performed by: Manuela Schwartz B Pre-anesthesia Checklist: Patient identified, Emergency Drugs available, Suction available, Patient being monitored and Timeout performed Patient Re-evaluated:Patient Re-evaluated prior to inductionOxygen Delivery Method: Circle system utilized Preoxygenation: Pre-oxygenation with 100% oxygen Intubation Type: IV induction Ventilation: Mask ventilation without difficulty Laryngoscope Size: Mac and 3 Grade View: Grade I Tube type: Oral Tube size: 7.5 mm Number of attempts: 1 Airway Equipment and Method: Stylet Placement Confirmation: ETT inserted through vocal cords under direct vision,  positive ETCO2 and breath sounds checked- equal and bilateral Secured at: 22 cm Tube secured with: Tape Dental Injury: Teeth and Oropharynx as per pre-operative assessment

## 2016-01-13 NOTE — Op Note (Signed)
NAMERICHEY, SORIANO NO.:  [id number]  MEDICAL RECORD NO.:  [id number]  LOCATION:  [facility]                         FACILITY:  [facility]  PHYSICIAN:  Satira Anis. Tayllor Breitenstein, M.D.DATE OF BIRTH:  1940/10/30  DATE OF PROCEDURE: DATE OF DISCHARGE:                              OPERATIVE REPORT   PREOPERATIVE DIAGNOSIS:  Comminuted proximal 3rd humerus fracture, significantly displaced and comminuted, nonarticular in nature, left humerus.  POSTOPERATIVE DIAGNOSIS:  Comminuted proximal 3rd humerus fracture, significantly displaced and comminuted, nonarticular in nature, left humerus.  PROCEDURE: 1. Open reduction and internal fixation with a 12-hole Zimmer NCB     straight narrow shaft plate utilizing 4.0 cortical screws and     protective locking caps.  This was an open reduction internal     fixation of the aforementioned fracture process. 2. AP lateral and oblique x-rays performed, examined, interpreted by     myself.  SURGEON:  Satira Anis. Amedeo Plenty, MD  ASSISTANT:  Avelina Laine, PA-C  COMPLICATIONS:  None.  DRAINS:  One.  INDICATIONS:  This patient is a 75 year old male who presents with the above-mentioned diagnosis.  I have counseled him in regard to risks and benefits of surgery.  Given the displacement, pain, and problems, he desires to proceed with surgical fixation.  OPERATIVE PROCEDURE:  The patient was seen by myself and Anesthesia, taken to the operative theater and underwent a smooth induction of general anesthetic, prepped and draped in usual sterile fashion with Hibiclens pre-scrub followed by Betadine scrub and paint about the left upper extremity.  The patient had drapes placed, Ioban placed about the arm and following this, time-out was called.  Pre and postop check was secured and incision was made anteriorly about the upper left arm. Dissection was carried down.  We very carefully incised the fascia and protected the cutaneous nerve branches.   Following this, we then swept the biceps medially and entered the plane that was an internervous plane about the brachialis.  The brachialis was compromised and there was large amount of hematoma and injured tissue.  At this time, I dissected more laterally and identified the radial nerve to make sure that we were not in jeopardy of advancing too close to this.  The nerve was evaluated with a 4.0 loupe magnification.  Following this, we turned attention back towards the prior fracture which was in the proximal 3rd.  The fracture was treated very carefully with exposure followed by irrigation, debridement, and coaptation of the fracture with provisional holding clamp.  I then applied a 12-hole Zimmer NCB narrow straight shaft plate in compression mode without difficulty.  Following this, we were able to achieve 5 screws proximal and distal and leave 2 of the middle screws free as this was the area of combination.  The patient tolerated this well.  This gave excellent fixation.  Caps were placed over the 4.0 cortical screw holes for locking purpose and there were no complicating features.  Following this, the patient then underwent copious irrigation with 3 L and AP lateral and oblique x-rays were performed, examined, and interpreted by myself and looked to be excellent.  Bleeding was approximately 500 mL.  The bone bled briskly during  the exposure prior to fixation, but after fixation, there was no bleeding. Nevertheless, we did place a Hemovac drain.  The brachialis was closed with Vicryl, subcu with Vicryl and the skin edge with staple clips.  The patient was dressed in a long-arm splint without difficulty.  There were no complicating features.  He will be admitted for IV antibiotics and general postop observation. This was an open reduction internal fixation of comminuted humerus fracture utilizing the 12-hole Zimmer plate.  The patient tolerated this beautifully.  There were no  complicating features.  All sponge, needle, and instrument counts were reported as correct. Drain was hooked up to suction.  He was stable in recovery room.  We will monitor him.  Hemoglobin was stable intraoperatively; however we are going to watch him closely as his hemoglobin started at 10.  The patient and his family are aware of all issues and plans.     Satira Anis. Amedeo Plenty, M.D.     [facility]  D:  01/13/2016  T:  01/13/2016  Job:  [id number]

## 2016-01-13 NOTE — Anesthesia Preprocedure Evaluation (Addendum)
Anesthesia Evaluation  Patient identified by MRN, date of birth, ID band Patient awake    Reviewed: Allergy & Precautions, NPO status , Patient's Chart, lab work & pertinent test results  History of Anesthesia Complications Negative for: history of anesthetic complications  Airway Mallampati: II  TM Distance: >3 FB Neck ROM: Full    Dental  (+) Upper Dentures, Edentulous Upper, Partial Lower, Missing   Pulmonary neg shortness of breath, neg COPD, neg recent URI, former smoker,    breath sounds clear to auscultation       Cardiovascular hypertension, Pt. on medications  Rhythm:Regular Rate:Normal     Neuro/Psych  Headaches, negative psych ROS   GI/Hepatic Neg liver ROS, GERD  ,  Endo/Other  negative endocrine ROS  Renal/GU Renal disease     Musculoskeletal  (+) Arthritis ,   Abdominal   Peds  Hematology  (+) anemia ,   Anesthesia Other Findings Left humerus fracture  Reproductive/Obstetrics                          Anesthesia Physical Anesthesia Plan  ASA: II  Anesthesia Plan: General and Regional   Post-op Pain Management:  Regional for Post-op pain   Induction: Intravenous  Airway Management Planned: Oral ETT  Additional Equipment: None  Intra-op Plan:   Post-operative Plan: Extubation in OR  Informed Consent: I have reviewed the patients History and Physical, chart, labs and discussed the procedure including the risks, benefits and alternatives for the proposed anesthesia with the patient or authorized representative who has indicated his/her understanding and acceptance.   Dental advisory given  Plan Discussed with: Surgeon and CRNA  Anesthesia Plan Comments:        Anesthesia Quick Evaluation

## 2016-01-13 NOTE — H&P (Signed)
George Dyer is an 75 y.o. male.   Chief Complaint: Left humerus fracture displaced HPI: Patient presents for operative treatment of his left humerus fracture he understands risks and benefits and desires to proceed.  Patient presents for evaluation and treatment of the of their upper extremity predicament. The patient denies neck, back, chest or  abdominal pain. The patient notes that they have no lower extremity problems. The patients primary complaint is noted. We are planning surgical care pathway for the upper extremity.  Past Medical History:  Diagnosis Date  . Arthritis   . Constipation   . ED (erectile dysfunction)   . GERD (gastroesophageal reflux disease)   . Headache   . Hypertension   . Kidney stone   . Lumbar disc disease   . Pinched nerve   . Pneumonia   . SVC syndrome    SECONDARY TO CHRONIC FIBROSING MEDIASTINITIS    Past Surgical History:  Procedure Laterality Date  . BREAST BIOPSY     RIGHT BREAST  . CARDIAC SURGERY  1970's   wife wasn't sure what type of heart surgery.  . COLONOSCOPY    . INGUINAL HERNIA REPAIR  07/10/2012   Procedure: LAPAROSCOPIC INGUINAL HERNIA;  Surgeon: Ralene Ok, MD;  Location: [place];  Service: General;  Laterality: Right;  . INGUINAL HERNIA REPAIR  07/10/12   RIH  . INSERTION OF MESH  07/10/2012   Procedure: INSERTION OF MESH;  Surgeon: Ralene Ok, MD;  Location: [place];  Service: General;  Laterality: Right;  . LUMBAR DISC SURGERY    . TONSILLECTOMY    . US ECHOCARDIOGRAPHY  04/20/1993   EF 65%    Family History  Problem Relation Age of Onset  . Hypertension Mother   . Heart attack Father   . Heart disease Father   . Cancer Maternal Aunt     breast  . Cancer Maternal Uncle    Social History:  reports that he quit smoking about 34 years ago. His smoking use included Cigarettes. He has a 1.50 pack-year smoking history. He has never used smokeless tobacco. He reports that he does not drink alcohol or use  drugs.  Allergies:  Allergies  Allergen Reactions  . Percocet [Oxycodone-Acetaminophen] Nausea And Vomiting    Medications Prior to Admission  Medication Sig Dispense Refill  . acetaminophen (TYLENOL) 500 MG tablet Take 500 mg by mouth every 6 (six) hours as needed. For pain.    . Ascorbic Acid (VITAMIN C) 1000 MG tablet Take 1,000 mg by mouth daily.    Marland Kitchen docusate sodium (COLACE) 100 MG capsule Take 100 mg by mouth 2 (two) times daily as needed for mild constipation.    Marland Kitchen HYDROcodone-acetaminophen (NORCO) 10-325 MG tablet Take 1 tablet by mouth every 6 (six) hours as needed.    Marland Kitchen HYDROmorphone (DILAUDID) 2 MG tablet Take 2 mg by mouth every 4 (four) hours as needed for severe pain.    Marland Kitchen lansoprazole (PREVACID) 30 MG capsule Take 1 capsule (30 mg total) by mouth daily. 90 capsule 3  . lisinopril-hydrochlorothiazide (PRINZIDE,ZESTORETIC) 20-12.5 MG tablet TAKE 1 TABLET BY MOUTH DAILY. 30 tablet 12  . methocarbamol (ROBAXIN) 500 MG tablet Take 500 mg by mouth 4 (four) times daily.      Results for orders placed or performed during the hospital encounter of 01/13/16 (from the past 48 hour(s))  Basic metabolic panel     Status: None   Collection Time: 01/13/16  3:05 PM  Result Value Ref Range  Sodium 136 135 - 145 mmol/L   Potassium 4.0 3.5 - 5.1 mmol/L   Chloride 104 101 - 111 mmol/L   CO2 25 22 - 32 mmol/L   Glucose, Bld 91 65 - 99 mg/dL   BUN 16 6 - 20 mg/dL   Creatinine, Ser 1.07 0.61 - 1.24 mg/dL   Calcium 9.4 8.9 - 10.3 mg/dL   GFR calc non Af Amer >60 >60 mL/min   GFR calc Af Amer >60 >60 mL/min    Comment: (NOTE) The eGFR has been calculated using the CKD EPI equation. This calculation has not been validated in all clinical situations. eGFR's persistently <60 mL/min signify possible Chronic Kidney Disease.    Anion gap 7 5 - 15  CBC     Status: Abnormal   Collection Time: 01/13/16  3:05 PM  Result Value Ref Range   WBC 7.2 4.0 - 10.5 K/uL   RBC 3.92 (L) 4.22 - 5.81  MIL/uL   Hemoglobin 10.9 (L) 13.0 - 17.0 g/dL   HCT 34.9 (L) 39.0 - 52.0 %   MCV 89.0 78.0 - 100.0 fL   MCH 27.8 26.0 - 34.0 pg   MCHC 31.2 30.0 - 36.0 g/dL   RDW 14.0 11.5 - 15.5 %   Platelets 376 150 - 400 K/uL   No results found.  Review of Systems  Constitutional: Negative.   Respiratory: Negative.   Cardiovascular: Negative.   Gastrointestinal: Negative.   Genitourinary: Negative.   Neurological: Negative.     Blood pressure 131/71, pulse 82, temperature 98.6 F (37 C), temperature source Oral, resp. rate 18, height '5\' 9"'  (1.753 m), weight 72.6 kg (160 lb), SpO2 98 %. Physical Exam displaced left humerus fracture with large amount of ecchymosis. He does have sensation in his hand and can flex and extend through the short arc  No evidence of dystrophy compartment syndrome or infection. He has a displaced fracture which is quite painful  The patient is alert and oriented in no acute distress. The patient complains of pain in the affected upper extremity.  The patient is noted to have a normal HEENT exam. Lung fields show equal chest expansion and no shortness of breath. Abdomen exam is nontender without distention. Lower extremity examination does not show any fracture dislocation or blood clot symptoms. Pelvis is stable and the neck and back are stable and nontender.  Assessment/Plan We will plan for open reduction internal fixation left humerus fracture. Patient understands risk and benefits.We are planning surgery for your upper extremity. The risk and benefits of surgery to include risk of bleeding, infection, anesthesia,  damage to normal structures and failure of the surgery to accomplish its intended goals of relieving symptoms and restoring function have been discussed in detail. With this in mind we plan to proceed. I have specifically discussed with the patient the pre-and postoperative regime and the dos and don'ts and risk and benefits in great detail. Risk and  benefits of surgery also include risk of dystrophy(CRPS), chronic nerve pain, failure of the healing process to go onto completion and other inherent risks of surgery The relavent the pathophysiology of the disease/injury process, as well as the alternatives for treatment and postoperative course of action has been discussed in great detail with the patient who desires to proceed.  We will do everything in our power to help you (the patient) restore function to the upper extremity. It is a pleasure to see this patient today.    Paulene Floor, MD 01/13/2016,  4:48 PM

## 2016-01-13 NOTE — Op Note (Signed)
See dictation [phone]  Status post ORIF left proximal third humerus fracture with a 12 hole Zimmer NCBstraight narrow shaft plate  Roshun Klingensmith MD

## 2016-01-13 NOTE — Anesthesia Postprocedure Evaluation (Signed)
Anesthesia Post Note  Patient: RAYNAV ETHRIDGE  Procedure(s) Performed: Procedure(s) (LRB): OPEN REDUCTION INTERNAL FIXATION (ORIF) LEFT PROXIMAL HUMERUS FRACTURE (Left)  Patient location during evaluation: PACU Anesthesia Type: General and Regional Level of consciousness: awake and alert Pain management: pain level controlled Vital Signs Assessment: post-procedure vital signs reviewed and stable Respiratory status: spontaneous breathing, nonlabored ventilation, respiratory function stable and patient connected to nasal cannula oxygen Cardiovascular status: blood pressure returned to baseline and stable Postop Assessment: no signs of nausea or vomiting Anesthetic complications: no    Last Vitals:  Vitals:   01/13/16 2030 01/13/16 2140  BP: 130/69 120/67  Pulse: (!) 102 93  Resp: 16 18  Temp:      Last Pain:  Vitals:   01/13/16 2130  TempSrc:   PainSc: 3                  Catalina Gravel

## 2016-01-13 NOTE — Transfer of Care (Signed)
Immediate Anesthesia Transfer of Care Note  Patient: George Dyer  Procedure(s) Performed: Procedure(s): OPEN REDUCTION INTERNAL FIXATION (ORIF) LEFT PROXIMAL HUMERUS FRACTURE (Left)  Patient Location: PACU  Anesthesia Type:General  Level of Consciousness: awake, alert  and oriented  Airway & Oxygen Therapy: Patient Spontanous Breathing  Post-op Assessment: Report given to RN and Post -op Vital signs reviewed and stable  Post vital signs: Reviewed and stable  Last Vitals:  Vitals:   01/13/16 1511 01/13/16 1935  BP: 131/71 (!) 152/108  Pulse: 82 (!) 102  Resp: 18 15  Temp: 37 C (!) 36 C    Last Pain:  Vitals:   01/13/16 1511  TempSrc: Oral  PainSc:          Complications: No apparent anesthesia complications

## 2016-01-13 NOTE — Anesthesia Procedure Notes (Signed)
Anesthesia Regional Block:  Interscalene brachial plexus block  Pre-Anesthetic Checklist: ,, timeout performed, Correct Patient, Correct Site, Correct Laterality, Correct Procedure, Correct Position, site marked, Risks and benefits discussed,  Surgical consent,  Pre-op evaluation,  At surgeon's request and post-op pain management  Laterality: Upper and Left  Prep: chloraprep       Needles:  Injection technique: Single-shot  Needle Type: Echogenic Stimulator Needle          Additional Needles:  Procedures: ultrasound guided (picture in chart) Interscalene brachial plexus block Narrative:  Injection made incrementally with aspirations every 5 mL.  Performed by: Personally  Anesthesiologist: Will Heinkel  Additional Notes: H+P and labs reviewed, risks and benefits discussed with patient, procedure tolerated well without complications

## 2016-01-14 DIAGNOSIS — S42202A Unspecified fracture of upper end of left humerus, initial encounter for closed fracture: Secondary | ICD-10-CM | POA: Diagnosis not present

## 2016-01-14 DIAGNOSIS — Z87891 Personal history of nicotine dependence: Secondary | ICD-10-CM | POA: Diagnosis not present

## 2016-01-14 DIAGNOSIS — I10 Essential (primary) hypertension: Secondary | ICD-10-CM | POA: Diagnosis not present

## 2016-01-14 DIAGNOSIS — K219 Gastro-esophageal reflux disease without esophagitis: Secondary | ICD-10-CM | POA: Diagnosis not present

## 2016-01-14 DIAGNOSIS — Z79899 Other long term (current) drug therapy: Secondary | ICD-10-CM | POA: Diagnosis not present

## 2016-01-14 DIAGNOSIS — K5900 Constipation, unspecified: Secondary | ICD-10-CM | POA: Diagnosis not present

## 2016-01-14 LAB — URINALYSIS, ROUTINE W REFLEX MICROSCOPIC
Bilirubin Urine: NEGATIVE
Glucose, UA: NEGATIVE mg/dL
Hgb urine dipstick: NEGATIVE
Ketones, ur: NEGATIVE mg/dL
LEUKOCYTES UA: NEGATIVE
NITRITE: NEGATIVE
Protein, ur: NEGATIVE mg/dL
SPECIFIC GRAVITY, URINE: 1.014 (ref 1.005–1.030)
pH: 5.5 (ref 5.0–8.0)

## 2016-01-14 LAB — CBC WITH DIFFERENTIAL/PLATELET
Basophils Absolute: 0 10*3/uL (ref 0.0–0.1)
Basophils Relative: 0 %
EOS PCT: 1 %
Eosinophils Absolute: 0.1 10*3/uL (ref 0.0–0.7)
HCT: 27.1 % — ABNORMAL LOW (ref 39.0–52.0)
Hemoglobin: 8.3 g/dL — ABNORMAL LOW (ref 13.0–17.0)
LYMPHS PCT: 13 %
Lymphs Abs: 1 10*3/uL (ref 0.7–4.0)
MCH: 27.1 pg (ref 26.0–34.0)
MCHC: 30.6 g/dL (ref 30.0–36.0)
MCV: 88.6 fL (ref 78.0–100.0)
MONO ABS: 0.7 10*3/uL (ref 0.1–1.0)
MONOS PCT: 8 %
NEUTROS ABS: 6.3 10*3/uL (ref 1.7–7.7)
Neutrophils Relative %: 78 %
PLATELETS: 310 10*3/uL (ref 150–400)
RBC: 3.06 MIL/uL — ABNORMAL LOW (ref 4.22–5.81)
RDW: 14 % (ref 11.5–15.5)
WBC: 8 10*3/uL (ref 4.0–10.5)

## 2016-01-14 LAB — POCT I-STAT 4, (NA,K, GLUC, HGB,HCT)
GLUCOSE: 126 mg/dL — AB (ref 65–99)
HEMATOCRIT: 28 % — AB (ref 39.0–52.0)
HEMOGLOBIN: 9.5 g/dL — AB (ref 13.0–17.0)
POTASSIUM: 4 mmol/L (ref 3.5–5.1)
Sodium: 139 mmol/L (ref 135–145)

## 2016-01-14 NOTE — Care Management Note (Signed)
Case Management Note  Patient Details  Name: George Dyer MRN: [id number] Date of Birth: 1940/10/17  Subjective/Objective:       Left Humerous Fracture             Action/Plan: Discharge Planning: AVS reviewed:   NCM spoke to pt and wife. Pt states No DME needed for home. Wife at home to assist with his care.   Expected Discharge Date:  01/14/2016                Expected Discharge Plan:  Home/Self Care  In-House Referral:  NA  Discharge planning Services  CM Consult  Post Acute Care Choice:  NA Choice offered to:  NA  DME Arranged:  N/A DME Agency:     HH Arranged:  NA HH Agency:  NA  Status of Service:  Completed, signed off  If discussed at [place] of Stay Meetings, dates discussed:    Additional Comments:  Erenest Rasher, RN 01/14/2016, 12:46 PM

## 2016-01-14 NOTE — Evaluation (Signed)
Occupational Therapy Evaluation Patient Details Name: George Dyer MRN: [id number] DOB: Nov 04, 1940 Today's Date: 01/14/2016    History of Present Illness s/p ORIF L proximal humerus, fx sustained in a fall at a gas station   Clinical Impression   Pt presents with increased pain and somewhat sleepy upon evaluation. Elevated L UE on 3 pillows, performed retrograde massage to L hand and applied ice. Pt performed AROM of L hand. Educated in compensatory strategies for ADL and positioning L UE for comfort in supine and sitting. Reinforced education with handout. Will follow acutely.    Follow Up Recommendations  No OT follow up    Equipment Recommendations  None recommended by OT    Recommendations for Other Services       Precautions / Restrictions Precautions Precautions: Shoulder Type of Shoulder Precautions: elevation, edema management and ADL orders Shoulder Interventions: Shoulder sling/immobilizer Precaution Booklet Issued: Yes (comment) Required Braces or Orthoses: Sling Restrictions Weight Bearing Restrictions: Yes LUE Weight Bearing: Non weight bearing Other Position/Activity Restrictions: no specific order, but instructed pt in NWB      Mobility Bed Mobility Overal bed mobility: Modified Independent             General bed mobility comments: got up toward R side, simulating home  Transfers Overall transfer level: Modified independent                    Balance                                            ADL Overall ADL's : Needs assistance/impaired Eating/Feeding: Independent;Bed level   Grooming: Wash/dry hands;Wash/dry face;Sitting;Set up                     Toilet Transfer Details (indicate cue type and reason): stood EOB and used urinal           General ADL Comments: Educated pt and performed retrograde massage, icing, elevation and ROM of L hand for edema managment. Educated in compensatory strategies  for ADL and use of shoulder immobilizer.      Vision     Perception     Praxis      Pertinent Vitals/Pain Pain Assessment: Faces Faces Pain Scale: Hurts whole lot Pain Location: L UE Pain Descriptors / Indicators: Aching;Guarding;Grimacing Pain Intervention(s): Monitored during session;Repositioned;Ice applied;Premedicated before session     Hand Dominance Right   Extremity/Trunk Assessment Upper Extremity Assessment Upper Extremity Assessment: LUE deficits/detail LUE Deficits / Details: splinted from MPs to shoulder LUE: Unable to fully assess due to immobilization;Unable to fully assess due to pain LUE Coordination: decreased gross motor;decreased fine motor   Lower Extremity Assessment Lower Extremity Assessment: Overall WFL for tasks assessed       Communication Communication Communication: No difficulties   Cognition Arousal/Alertness: Awake/alert Behavior During Therapy: Flat affect Overall Cognitive Status: Within Functional Limits for tasks assessed                     General Comments       Exercises       Shoulder Instructions      Home Living Family/patient expects to be discharged to:: Private residence Living Arrangements: Spouse/significant other Available Help at Discharge: Family;Available 24 hours/day  Prior Functioning/Environment Level of Independence: Independent        Comments: likes to work on cars    OT Diagnosis: Generalized weakness;Acute pain   OT Problem List: Decreased activity tolerance;Decreased knowledge of precautions;Impaired UE functional use;Pain;Increased edema;Decreased range of motion   OT Treatment/Interventions: Self-care/ADL training;DME and/or AE instruction;Therapeutic activities;Patient/family education;Therapeutic exercise    OT Goals(Current goals can be found in the care plan section) Acute Rehab OT Goals Patient Stated Goal: regain use of L  UE OT Goal Formulation: With patient Time For Goal Achievement: 01/21/16 Potential to Achieve Goals: Good ADL Goals Pt/caregiver will Perform Home Exercise Program: Left upper extremity;Independently (digits only) Additional ADL Goal #1: Pt and wife will be knowledgeable in edema management techniques. Additional ADL Goal #2: Pt will perform ADL with min assist with assist of wife using compensatory strategies. Additional ADL Goal #3: Pt and wife will be independent in donning and doffing L shoulder immobilizer.  OT Frequency: Min 2X/week   Barriers to D/C:            Co-evaluation              End of Session    Activity Tolerance: Patient limited by pain Patient left: in bed;with call bell/phone within reach   Time: [id number] OT Time Calculation (min): 33 min Charges:  OT General Charges $OT Visit: 1 Procedure OT Evaluation $OT Eval Low Complexity: 1 Procedure OT Treatments $Therapeutic Activity: 8-22 mins G-Codes: OT G-codes **NOT FOR INPATIENT CLASS** Functional Assessment Tool Used: clinical judgement Functional Limitation: Self care Self Care Current Status [id number]): At least 20 percent but less than 40 percent impaired, limited or restricted Self Care Goal Status [id number]): At least 1 percent but less than 20 percent impaired, limited or restricted  George Dyer 01/14/2016, 9:29 AM  [phone]

## 2016-01-14 NOTE — Discharge Instructions (Signed)

## 2016-01-14 NOTE — Progress Notes (Signed)
Pt ready for discharge. Education/instructions reviewed with pt and wife and all questions/concerns addressed. IV removed and belongings gathered. Pt will be transported out via wheelchair to wife's car. Will continue to monitor

## 2016-01-14 NOTE — Progress Notes (Signed)
Subjective: 1 Day Post-Op Procedure(s) (LRB): OPEN REDUCTION INTERNAL FIXATION (ORIF) LEFT PROXIMAL HUMERUS FRACTURE (Left) Patient reports pain as improved this morning, he denies any nausea, vomiting, fever or chills. He denies lightheadedness, dizziness, shortness of breath. He states that his throat is sore.  Objective: Vital signs in last 24 hours: Temp:  [96.8 F (36 C)-100.5 F (38.1 C)] 100.5 F (38.1 C) (08/09 0307) Pulse Rate:  [82-102] 93 (08/09 0307) Resp:  [14-18] 17 (08/09 0307) BP: (104-186)/(57-108) 104/57 (08/09 0307) SpO2:  [91 %-99 %] 94 % (08/09 0307) Weight:  [72.6 kg (160 lb)] 72.6 kg (160 lb) (08/08 1511)  Intake/Output from previous day: 08/08 0701 - 08/09 0700 In: 2350 [I.V.:2100; IV Piggyback:250] Out: 1800 [Urine:1200; Blood:600] Intake/Output this shift: No intake/output data recorded.   Recent Labs  01/13/16 1505 01/13/16 1830 01/14/16 0418  HGB 10.9* 9.5* 8.3*    Recent Labs  01/13/16 1505 01/13/16 1830 01/14/16 0418  WBC 7.2  --  8.0  RBC 3.92*  --  3.06*  HCT 34.9* 28.0* 27.1*  PLT 376  --  310    Recent Labs  01/13/16 1505 01/13/16 1830  NA 136 139  K 4.0 4.0  CL 104  --   CO2 25  --   BUN 16  --   CREATININE 1.07  --   GLUCOSE 91 126*  CALCIUM 9.4  --    No results for input(s): LABPT, INR in the last 72 hours.  Evaluation of the left upper extremity reveals that his splint is clean dry and intact, he has had improvement in the overall swelling about the dorsal hand and digits since the time of the surgery given the diligent elevation and his attempts at finger range of motion. The drain is removed without difficulties. His sensation and refill are intact to the digits, radial, ulnar and median nerves are intact.  Assessment/Plan: 1 Day Post-Op Procedure(s) (LRB): OPEN REDUCTION INTERNAL FIXATION (ORIF) LEFT PROXIMAL HUMERUS FRACTURE (Left) I have discussed all issues with the patient this morning he is fairly sedate  during the visit but does state good understanding to all issues. I discussed with him diligent incentive spirometry today I have also discussed this with the nursing staff. We have discussed with him attenuation of his antibiotics. He will need to demonstrate confident gait with physical therapy. We will monitoring this morning for any symptoms consistent with acute blood loss. If the patient demonstrates excess with physical therapy, his pain is controlled and he is asymptomatic in regards to his acute blood loss we will look towards discharge after lunch or this evening. However if he is having any difficulties with the afternoon mentioned we will continue his admit. I have discussed this with his nurse today at length as well as the patient.  Burhanuddin Kohlmann L 01/14/2016, 9:34 AM

## 2016-01-14 NOTE — Progress Notes (Signed)
Occupational Therapy Treatment Patient Details Name: YOSIEL THIEME MRN: [id number] DOB: 08-29-1940 Today's Date: 01/14/2016    History of present illness s/p ORIF L proximal humerus, fx sustained in a fall at a gas station   OT comments  Educated pt's family in edema management, sling use, positioning L UE in bed and chair and compensatory strategies for ADL. Pt declined practicing donning shirt over splint, insisted on placing over his sling due to pain.   Follow Up Recommendations  No OT follow up    Equipment Recommendations  None recommended by OT    Recommendations for Other Services      Precautions / Restrictions Precautions Precautions: Shoulder Type of Shoulder Precautions: elevation, edema management and ADL orders Shoulder Interventions: Shoulder sling/immobilizer Precaution Comments: reviewed information with wife and daughter Required Braces or Orthoses: Sling Restrictions Other Position/Activity Restrictions: no specific order, but instructed pt in NWB       Mobility Bed Mobility               General bed mobility comments: pt at EOB  Transfers Overall transfer level: Independent                    Balance                                   ADL Overall ADL's : Needs assistance/impaired                         Toilet Transfer: Modified Independent;Ambulation   Toileting- Clothing Manipulation and Hygiene: Sit to/from stand;Maximal assistance       Functional mobility during ADLs: Modified independent General ADL Comments: Family available for session. Educated in edema management including retrograde massage and AROM of L digits, icing, elevation.  Instructed in proper positioning of sling with immobilizer strap, positioning L UE in bed and chair and compensatory strategies for ADL. Pt insisting on wearing shirt over his sling, but did instruct that this was not necessary.       Vision                      Perception     Praxis      Cognition   Behavior During Therapy: Flat affect Overall Cognitive Status: Within Functional Limits for tasks assessed                       Extremity/Trunk Assessment               Exercises     Shoulder Instructions       General Comments      Pertinent Vitals/ Pain       Pain Assessment: Faces Faces Pain Scale: Hurts whole lot Pain Location: L UE Pain Descriptors / Indicators: Grimacing;Guarding;Heaviness Pain Intervention(s): Monitored during session;Premedicated before session;Repositioned;Limited activity within patient's tolerance  Home Living                                          Prior Functioning/Environment              Frequency Min 2X/week     Progress Toward Goals  OT Goals(current goals can now be found in the care plan section)  Progress towards OT goals:  Goals met/education completed, patient discharged from OT  Acute Rehab OT Goals Patient Stated Goal: regain use of L UE ADL Goals Pt/caregiver will Perform Home Exercise Program: Left upper extremity;Independently (digits only) Additional ADL Goal #1: Pt and wife will be knowledgeable in edema management techniques. Additional ADL Goal #2: Pt will perform ADL with min assist with assist of wife using compensatory strategies. Additional ADL Goal #3: Pt and wife will be independent in donning and doffing L shoulder immobilizer.  Plan Discharge plan remains appropriate    Co-evaluation                 End of Session     Activity Tolerance Patient limited by pain   Patient Left in bed;with call bell/phone within reach;with family/visitor present   Nurse Communication          Time: 1250-1306 OT Time Calculation (min): 16 min  Charges: OT General Charges $OT Visit: 1 Procedure OT Treatments $Self Care/Home Management : 8-22 mins  [name] 01/14/2016, 1:26 PM [phone]

## 2016-01-14 NOTE — Progress Notes (Signed)
PT Cancellation Note  Patient Details Name: George Dyer MRN: [id number] DOB: 1941/03/17   Cancelled Treatment:    Reason Eval/Treat Not Completed: PT screened, no needs identified, will sign off. 01/14/2016  Donnella Sham, PT [phone]  (pager)   Berthold Glace, Tessie Fass 01/14/2016, 1:31 PM

## 2016-01-14 NOTE — Discharge Summary (Signed)
Physician Discharge Summary  Patient ID: George Dyer MRN: [id number] DOB/AGE: 01/28/1941 75 y.o.  Admit date: 01/13/2016 Discharge date:   Admission Diagnoses: LEFT HUMEROUS FRACTURE Past Medical History:  Diagnosis Date  . Arthritis   . Constipation   . ED (erectile dysfunction)   . GERD (gastroesophageal reflux disease)   . Headache   . Hypertension   . Kidney stone   . Lumbar disc disease   . Pinched nerve   . Pneumonia   . 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 male who was admitted 01/13/2016 with a chief complaint ofLeft proximal humerus fracture, and found to have a diagnosis of LEFT HUMEROUS FRACTURE.  They were brought to the operating room on 01/13/2016 and underwent Procedure(s): OPEN REDUCTION INTERNAL FIXATION (ORIF) LEFT PROXIMAL HUMERUS FRACTURE.    They were given perioperative antibiotics: Anti-infectives    Start     Dose/Rate Route Frequency Ordered Stop   01/14/16 0600  ceFAZolin (ANCEF) IVPB 2g/100 mL premix     2 g 200 mL/hr over 30 Minutes Intravenous On call to O.R. 01/13/16 1452 01/14/16 0703   01/14/16 0400  ceFAZolin (ANCEF) IVPB 2g/100 mL premix     2 g 200 mL/hr over 30 Minutes Intravenous Every 8 hours 01/13/16 2130     01/13/16 2000  ceFAZolin (ANCEF) IVPB 1 g/50 mL premix     1 g 100 mL/hr over 30 Minutes Intravenous NOW 01/13/16 1951 01/13/16 2024   01/13/16 1953  ceFAZolin (ANCEF) 1 GM/50ML IVPB    Comments:  Veneta Penton   : cabinet override      01/13/16 1953 01/14/16 0759   01/13/16 1455  ceFAZolin (ANCEF) 2-4 GM/100ML-% IVPB    Comments:  Starleen Arms   : cabinet override      01/13/16 1455 01/14/16 0259     .  They were given sequential compression devices, early ambulation for DVT prophylaxis.  Recent vital signs: Patient Vitals for the past 24 hrs:  BP Temp Temp src Pulse Resp SpO2 Height Weight  01/14/16 0307 (!) 104/57 (!) 100.5 F (38.1 C) Oral 93 17 94 % - -  01/13/16 2140 120/67 - - 93 18 99 % - -  01/13/16 2030 130/69 - - (!) 102 16 97 % - -  01/13/16 2015 132/78 - - 87 14 93 % - -  01/13/16 2000 130/82 - - 92 15 93 % - -  01/13/16 1945 (!) 186/69 - - 93 16 91 % - -  01/13/16 1935 (!) 152/108 (!) 96.8 F (36 C) - (!) 102 15 - - -  01/13/16 1511 131/71 98.6 F (37 C) Oral 82 18 98 % 5\' 9"  (1.753 m) 72.6 kg (160 lb)  .  Recent laboratory studies: No results found.  Discharge Medications:  The patient will resume hydrocodone 03/09/2024 one to 2 by mouth every 4-6 when necessary pain as well as Robaxin 500 mg 1 by mouth every 6 hours when necessary pain Resume his normal home medication regime  Diagnostic Studies: No results found.  They benefited maximally from their hospital stay and there were no complications.     Disposition: 01-Home or Self Care Discharge Instructions  Call MD / Call 911    Complete by:  As directed   If you experience chest pain or shortness of breath, CALL 911 and be transported to the hospital emergency room.  If you develope a fever above 101 F, pus (white drainage) or increased drainage or redness at the wound, or calf pain, call your surgeon's office.   Constipation Prevention    Complete by:  As directed   Drink plenty of fluids.  Prune juice may be helpful.  You may use a stool softener, such as Colace (over the counter) 100 mg twice a day.  Use MiraLax (over the counter) for constipation as needed.   Diet - low sodium heart healthy    Complete by:  As directed   Discharge instructions    Complete by:  As directed   We recommend that you to take vitamin C 1000 mg a day to promote healing. We also recommend that if you require  pain medicine  that you take a stool softener to prevent constipation as most pain medicines will have constipation side effects. We recommend either Peri-Colace or Senokot and recommend that you also consider adding MiraLAX as well to prevent the constipation affects from pain medicine if you are required to use them. These medicines are over the counter and may be purchased at a local pharmacy. A cup of yogurt and a probiotic can also be helpful during the recovery process as the medicines can disrupt your intestinal environment.Keep bandage clean and dry.  Call for any problems.  No smoking.  Criteria for driving a car: you should be off your pain medicine for 7-8 hours, able to drive one handed(confident), thinking clearly and feeling able in your judgement to drive. Continue elevation as it will decrease swelling.  If instructed by MD move your fingers within the confines of the bandage/splint.  Use ice if instructed by your MD. Call immediately for any sudden loss of feeling in your hand/arm or change in functional abilities of the extremity.   Increase activity slowly as tolerated    Complete by:  As directed     Follow-up Information    Paulene Floor, MD. Schedule an appointment as soon as possible for a visit in 2 week(s).   Specialty:  Orthopedic Surgery Contact information: [address] [place] [address] [id number]            Signed: Ivan Croft 01/14/2016, 9:43 AM

## 2016-01-15 ENCOUNTER — Encounter (HOSPITAL_COMMUNITY): Payer: Self-pay | Admitting: Orthopedic Surgery

## 2016-01-28 DIAGNOSIS — Z4789 Encounter for other orthopedic aftercare: Secondary | ICD-10-CM | POA: Diagnosis not present

## 2016-01-28 DIAGNOSIS — S42322D Displaced transverse fracture of shaft of humerus, left arm, subsequent encounter for fracture with routine healing: Secondary | ICD-10-CM | POA: Diagnosis not present

## 2016-02-11 DIAGNOSIS — Z4789 Encounter for other orthopedic aftercare: Secondary | ICD-10-CM | POA: Diagnosis not present

## 2016-03-02 DIAGNOSIS — Z4789 Encounter for other orthopedic aftercare: Secondary | ICD-10-CM | POA: Diagnosis not present

## 2016-03-02 DIAGNOSIS — S42322D Displaced transverse fracture of shaft of humerus, left arm, subsequent encounter for fracture with routine healing: Secondary | ICD-10-CM | POA: Diagnosis not present

## 2016-03-31 DIAGNOSIS — S42322D Displaced transverse fracture of shaft of humerus, left arm, subsequent encounter for fracture with routine healing: Secondary | ICD-10-CM | POA: Diagnosis not present

## 2016-05-05 DIAGNOSIS — Z4789 Encounter for other orthopedic aftercare: Secondary | ICD-10-CM | POA: Diagnosis not present

## 2016-05-05 DIAGNOSIS — S42322D Displaced transverse fracture of shaft of humerus, left arm, subsequent encounter for fracture with routine healing: Secondary | ICD-10-CM | POA: Diagnosis not present

## 2016-06-20 NOTE — Progress Notes (Deleted)
George Dyer Date of Birth: [birth year] Medical Record [id number]  History of Present Illness: George Dyer is seen for yearly followup of hypertension. He also has a history of SVC syndrome.  He reports that he has been doing very well. He denies any chest pain, shortness of breath, or palpitations. He has had no increase in edema. He complains of chronic right foot pain related to nerve compression that did not improve with surgery. He stays very active. He did suffer a left humerus fracture in August 2017 treated with ORIF.   Current Outpatient Prescriptions on File Prior to Visit  Medication Sig Dispense Refill  . Ascorbic Acid (VITAMIN C) 1000 MG tablet Take 1,000 mg by mouth daily.    Marland Kitchen docusate sodium (COLACE) 100 MG capsule Take 100 mg by mouth 2 (two) times daily as needed for mild constipation.    Marland Kitchen HYDROcodone-acetaminophen (NORCO) 10-325 MG tablet Take 1 tablet by mouth every 6 (six) hours as needed.    . lansoprazole (PREVACID) 30 MG capsule Take 1 capsule (30 mg total) by mouth daily. 90 capsule 3  . lisinopril-hydrochlorothiazide (PRINZIDE,ZESTORETIC) 20-12.5 MG tablet TAKE 1 TABLET BY MOUTH DAILY. 30 tablet 12  . methocarbamol (ROBAXIN) 500 MG tablet Take 500 mg by mouth 4 (four) times daily.     No current facility-administered medications on file prior to visit.     Allergies  Allergen Reactions  . Percocet [Oxycodone-Acetaminophen] Nausea And Vomiting    Past Medical History:  Diagnosis Date  . Arthritis   . Constipation   . ED (erectile dysfunction)   . GERD (gastroesophageal reflux disease)   . Headache   . Hypertension   . Kidney stone   . Lumbar disc disease   . Pinched nerve   . Pneumonia   . SVC syndrome    SECONDARY TO CHRONIC FIBROSING MEDIASTINITIS    Past Surgical History:  Procedure Laterality Date  . BREAST BIOPSY     RIGHT BREAST  . CARDIAC SURGERY  1970's   wife wasn't sure what type of heart surgery.  . COLONOSCOPY    . INGUINAL HERNIA  REPAIR  07/10/2012   Procedure: LAPAROSCOPIC INGUINAL HERNIA;  Surgeon: Ralene Ok, MD;  Location: [facility];  Service: General;  Laterality: Right;  . INGUINAL HERNIA REPAIR  07/10/12   RIH  . INSERTION OF MESH  07/10/2012   Procedure: INSERTION OF MESH;  Surgeon: Ralene Ok, MD;  Location: [facility];  Service: General;  Laterality: Right;  . LUMBAR DISC SURGERY    . ORIF HUMERUS FRACTURE Left 01/13/2016   Procedure: OPEN REDUCTION INTERNAL FIXATION (ORIF) LEFT PROXIMAL HUMERUS FRACTURE;  Surgeon: Roseanne Kaufman, MD;  Location: [facility];  Service: Orthopedics;  Laterality: Left;  . TONSILLECTOMY    . US ECHOCARDIOGRAPHY  04/20/1993   EF 65%    History  Smoking Status  . Former Smoker  . Packs/day: 0.25  . Years: 6.00  . Types: Cigarettes  . Quit date: 03/25/1981  Smokeless Tobacco  . Never Used    History  Alcohol Use No    Family History  Problem Relation Age of Onset  . Hypertension Mother   . Heart attack Father   . Heart disease Father   . Cancer Maternal Aunt     breast  . Cancer Maternal Uncle     Review of Systems: As noted in history of present illness. He has chronic right foot pain.  All other systems were reviewed and are negative.  Physical Exam:  There were no vitals taken for this visit. WDWM in NAD.  The HEENT exam is unremarkable. The carotids are 2+ without bruits.  There is no thyromegaly.  He has marked venous engorgement in his upper extremities with significant collateral channels in his chest and abdomen.  The lungs are clear.   The heart exam reveals a regular rate with a normal S1 and S2.  There are no murmurs, gallops, or rubs.  The PMI is not displaced.   Abdominal exam reveals good bowel sounds.  There are no masses.  Exam of the legs reveal no edema.    The distal pulses are intact.  Cranial nerves II - XII are intact.    The gait is normal.  LABORATORY DATA:  Lab Results  Component Value Date   WBC 8.0 01/14/2016   HGB 8.3 (L) 01/14/2016   HCT  27.1 (L) 01/14/2016   PLT 310 01/14/2016   GLUCOSE 126 (H) 01/13/2016   CHOL 190 06/27/2015   TRIG 163 (H) 06/27/2015   HDL 39 (L) 06/27/2015   LDLDIRECT 129.0 04/14/2012   LDLCALC 118 06/27/2015   ALT 13 06/27/2015   AST 12 06/27/2015   NA 139 01/13/2016   K 4.0 01/13/2016   CL 104 01/13/2016   CREATININE 1.07 01/13/2016   BUN 16 01/13/2016   CO2 25 01/13/2016    Assessment / Plan: 1. Hypertension, well-controlled. Will followup fasting labs today. Refilled lisinopril HCT.  2. 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 Asymptomatic.  Follow up in one year.

## 2016-06-25 ENCOUNTER — Ambulatory Visit: Payer: Commercial Managed Care - HMO | Admitting: Cardiology

## 2016-06-25 ENCOUNTER — Telehealth: Payer: Self-pay | Admitting: *Deleted

## 2016-06-25 MED ORDER — LISINOPRIL-HYDROCHLOROTHIAZIDE 20-12.5 MG PO TABS: 1.0000 | ORAL_TABLET | Freq: Every day | ORAL | 3 refills | Status: DC

## 2016-06-25 NOTE — Telephone Encounter (Signed)
Patient came by office ,had an appointment today reschedule due to weather refilled x 3

## 2016-07-08 ENCOUNTER — Ambulatory Visit: Payer: Commercial Managed Care - HMO | Admitting: Cardiology

## 2016-08-02 NOTE — Progress Notes (Signed)
George Dyer Date of Birth: Oct 27, 1940 Medical Record [id number]  History of Present Illness: George Dyer is seen for yearly followup of hypertension. He also has a history of SVC syndrome.  He reports that he has been doing very well. He denies any chest pain, shortness of breath, or palpitations. He has had no increase in edema. He complains of chronic right foot pain related to nerve compression that did not improve with surgery. He stays very active. He did have ORIF for left humeral fracture in August 2017 after falling at a gas station. Still has limitation of movement left arm with chronic pain.  Current Outpatient Prescriptions on File Prior to Visit  Medication Sig Dispense Refill  . Ascorbic Acid (VITAMIN C) 1000 MG tablet Take 1,000 mg by mouth daily.    Marland Kitchen docusate sodium (COLACE) 100 MG capsule Take 100 mg by mouth 2 (two) times daily as needed for mild constipation.    . lansoprazole (PREVACID) 30 MG capsule Take 1 capsule (30 mg total) by mouth daily. 90 capsule 3   No current facility-administered medications on file prior to visit.     Allergies  Allergen Reactions  . Percocet [Oxycodone-Acetaminophen] Nausea And Vomiting  . Oxycodone-Aspirin Nausea And Vomiting    Past Medical History:  Diagnosis Date  . Arthritis   . Constipation   . ED (erectile dysfunction)   . GERD (gastroesophageal reflux disease)   . Headache   . Hypertension   . Kidney stone   . Lumbar disc disease   . Pinched nerve   . Pneumonia   . SVC syndrome    SECONDARY TO CHRONIC FIBROSING MEDIASTINITIS    Past Surgical History:  Procedure Laterality Date  . BREAST BIOPSY     RIGHT BREAST  . CARDIAC SURGERY  1970's   wife wasn't sure what type of heart surgery.  . COLONOSCOPY    . INGUINAL HERNIA REPAIR  07/10/2012   Procedure: LAPAROSCOPIC INGUINAL HERNIA;  Surgeon: Ralene Ok, MD;  Location: [facility];  Service: General;  Laterality: Right;  . INGUINAL HERNIA REPAIR  07/10/12   RIH  .  INSERTION OF MESH  07/10/2012   Procedure: INSERTION OF MESH;  Surgeon: Ralene Ok, MD;  Location: [facility];  Service: General;  Laterality: Right;  . LUMBAR DISC SURGERY    . ORIF HUMERUS FRACTURE Left 01/13/2016   Procedure: OPEN REDUCTION INTERNAL FIXATION (ORIF) LEFT PROXIMAL HUMERUS FRACTURE;  Surgeon: Roseanne Kaufman, MD;  Location: [facility];  Service: Orthopedics;  Laterality: Left;  . TONSILLECTOMY    . US ECHOCARDIOGRAPHY  04/20/1993   EF 65%    History  Smoking Status  . Former Smoker  . Packs/day: 0.25  . Years: 6.00  . Types: Cigarettes  . Quit date: 03/25/1981  Smokeless Tobacco  . Never Used    History  Alcohol Use No    Family History  Problem Relation Age of Onset  . Hypertension Mother   . Heart attack Father   . Heart disease Father   . Cancer Maternal Aunt     breast  . Cancer Maternal Uncle     Review of Systems: As noted in history of present illness.  All other systems were reviewed and are negative.  Physical Exam: BP 123/71   Pulse 70   Ht 5\' 9"  (1.753 m)   Wt 168 lb 6.4 oz (76.4 kg)   BMI 24.87 kg/m  WDWM in NAD.  The HEENT exam is unremarkable. The carotids are 2+  without bruits.  There is no thyromegaly.  He has marked venous engorgement in his upper extremities with significant collateral channels in his chest and abdomen.  The lungs are clear.   The heart exam reveals a regular rate with a normal S1 and S2.  There are no murmurs, gallops, or rubs.  The PMI is not displaced.   Abdominal exam reveals good bowel sounds.  There are no masses.  Exam of the legs reveal no edema.    The distal pulses are intact.  Cranial nerves II - XII are intact.    The gait is normal.  LABORATORY DATA:  Lab Results  Component Value Date   WBC 8.0 01/14/2016   HGB 8.3 (L) 01/14/2016   HCT 27.1 (L) 01/14/2016   PLT 310 01/14/2016   GLUCOSE 126 (H) 01/13/2016   CHOL 190 06/27/2015   TRIG 163 (H) 06/27/2015   HDL 39 (L) 06/27/2015   LDLDIRECT 129.0 04/14/2012    LDLCALC 118 06/27/2015   ALT 13 06/27/2015   AST 12 06/27/2015   NA 139 01/13/2016   K 4.0 01/13/2016   CL 104 01/13/2016   CREATININE 1.07 01/13/2016   BUN 16 01/13/2016   CO2 25 01/13/2016    Assessment / Plan: 1. Hypertension, well-controlled. Will followup fasting labs today. Refilled lisinopril HCT.  2. 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 Asymptomatic.  Follow up in one year.

## 2016-08-03 ENCOUNTER — Ambulatory Visit (INDEPENDENT_AMBULATORY_CARE_PROVIDER_SITE_OTHER): Payer: Medicare HMO | Admitting: Cardiology

## 2016-08-03 ENCOUNTER — Encounter: Payer: Self-pay | Admitting: Cardiology

## 2016-08-03 VITALS — BP 123/71 | HR 70 | Ht 69.0 in | Wt 168.4 lb

## 2016-08-03 DIAGNOSIS — I10 Essential (primary) hypertension: Secondary | ICD-10-CM | POA: Diagnosis not present

## 2016-08-03 MED ORDER — LISINOPRIL-HYDROCHLOROTHIAZIDE 20-12.5 MG PO TABS: 1.0000 | ORAL_TABLET | Freq: Every day | ORAL | 11 refills | Status: DC

## 2016-08-03 NOTE — Patient Instructions (Signed)
Continue your current therapy  I will see you in one year   

## 2017-07-03 ENCOUNTER — Other Ambulatory Visit: Payer: Self-pay | Admitting: Cardiology

## 2017-07-04 NOTE — Telephone Encounter (Signed)
Rx request sent to pharmacy.  

## 2017-07-18 NOTE — Progress Notes (Signed)
George Dyer Date of Birth: [birth year] Medical Record #[id number]  History of Present Illness: George Dyer is seen for yearly followup of hypertension. He also has a history of SVC syndrome.    He reports he is doing well. He denies any chest pain or SOB. He has chronic foot pain from neuropathy. No dyspnea or chest pain. Tolerating medication well.   Current Outpatient Medications on File Prior to Visit  Medication Sig Dispense Refill  . lansoprazole (PREVACID) 30 MG capsule Take 1 capsule (30 mg total) by mouth daily. 90 capsule 3   No current facility-administered medications on file prior to visit.     Allergies  Allergen Reactions  . Percocet [Oxycodone-Acetaminophen] Nausea And Vomiting  . Oxycodone-Aspirin Nausea And Vomiting    Past Medical History:  Diagnosis Date  . Arthritis   . Constipation   . ED (erectile dysfunction)   . GERD (gastroesophageal reflux disease)   . Headache   . Hypertension   . Kidney stone   . Lumbar disc disease   . Pinched nerve   . Pneumonia   . SVC syndrome    SECONDARY TO CHRONIC FIBROSING MEDIASTINITIS    Past Surgical History:  Procedure Laterality Date  . BREAST BIOPSY     RIGHT BREAST  . CARDIAC SURGERY  1970's   wife wasn't sure what type of heart surgery.  . COLONOSCOPY    . INGUINAL HERNIA REPAIR  07/10/2012   Procedure: LAPAROSCOPIC INGUINAL HERNIA;  Surgeon: Ralene Ok, MD;  Location: [place];  Service: General;  Laterality: Right;  . INGUINAL HERNIA REPAIR  07/10/12   RIH  . INSERTION OF MESH  07/10/2012   Procedure: INSERTION OF MESH;  Surgeon: Ralene Ok, MD;  Location: [place];  Service: General;  Laterality: Right;  . LUMBAR DISC SURGERY    . ORIF HUMERUS FRACTURE Left 01/13/2016   Procedure: OPEN REDUCTION INTERNAL FIXATION (ORIF) LEFT PROXIMAL HUMERUS FRACTURE;  Surgeon: Roseanne Kaufman, MD;  Location: [place];  Service: Orthopedics;  Laterality: Left;  . TONSILLECTOMY    . US ECHOCARDIOGRAPHY  04/20/1993   EF 65%     Social History   Tobacco Use  Smoking Status Former Smoker  . Packs/day: 0.25  . Years: 6.00  . Pack years: 1.50  . Types: Cigarettes  . Last attempt to quit: 03/25/1981  . Years since quitting: 36.3  Smokeless Tobacco Never Used    Social History   Substance and Sexual Activity  Alcohol Use No    Family History  Problem Relation Age of Onset  . Hypertension Mother   . Heart attack Father   . Heart disease Father   . Cancer Maternal Aunt        breast  . Cancer Maternal Uncle     Review of Systems: As noted in history of present illness.  All other systems were reviewed and are negative.  Physical Exam: BP 115/68   Pulse 71   Ht 5\' 9"  (1.753 m)   Wt 161 lb (73 kg)   BMI 23.78 kg/m  GENERAL:  Well appearing WM in NAD HEENT:  PERRL, EOMI, sclera are clear. Oropharynx is clear. NECK:  + jugular venous distention, carotid upstroke brisk and symmetric, no bruits, no thyromegaly or adenopathy LUNGS:  Clear to auscultation bilaterally CHEST:  Prominent Venous collaterals HEART:  RRR,  PMI not displaced or sustained,S1 and S2 within normal limits, no S3, no S4: no clicks, no rubs, no murmurs ABD:  Soft, nontender. BS +,  no masses or bruits. No hepatomegaly, no splenomegaly EXT:  2 + pulses throughout, no edema, no cyanosis no clubbing SKIN:  Warm and dry.  No rashes NEURO:  Alert and oriented x 3. Cranial nerves II through XII intact. PSYCH:  Cognitively intact    LABORATORY DATA:  Lab Results  Component Value Date   WBC 8.0 01/14/2016   HGB 8.3 (L) 01/14/2016   HCT 27.1 (L) 01/14/2016   PLT 310 01/14/2016   GLUCOSE 126 (H) 01/13/2016   CHOL 190 06/27/2015   TRIG 163 (H) 06/27/2015   HDL 39 (L) 06/27/2015   LDLDIRECT 129.0 04/14/2012   LDLCALC 118 06/27/2015   ALT 13 06/27/2015   AST 12 06/27/2015   NA 139 01/13/2016   K 4.0 01/13/2016   CL 104 01/13/2016   CREATININE 1.07 01/13/2016   BUN 16 01/13/2016   CO2 25 01/13/2016   Ecg today shows  NSR with normal Ecg. I have personally reviewed and interpreted this study.  Assessment / Plan: 1. Hypertension, well-controlled. Will followup fasting labs today. Refilled lisinopril HCT.  2. 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 Asymptomatic.  Follow up in one year.

## 2017-07-22 ENCOUNTER — Ambulatory Visit: Payer: Medicare HMO | Admitting: Cardiology

## 2017-07-22 ENCOUNTER — Encounter: Payer: Self-pay | Admitting: Cardiology

## 2017-07-22 VITALS — BP 115/68 | HR 71 | Ht 69.0 in | Wt 161.0 lb

## 2017-07-22 DIAGNOSIS — I871 Compression of vein: Secondary | ICD-10-CM

## 2017-07-22 DIAGNOSIS — I10 Essential (primary) hypertension: Secondary | ICD-10-CM | POA: Diagnosis not present

## 2017-07-22 LAB — BASIC METABOLIC PANEL
BUN/Creatinine Ratio: 11 (ref 10–24)
BUN: 11 mg/dL (ref 8–27)
CALCIUM: 9.7 mg/dL (ref 8.6–10.2)
CO2: 26 mmol/L (ref 20–29)
CREATININE: 1.02 mg/dL (ref 0.76–1.27)
Chloride: 99 mmol/L (ref 96–106)
GFR calc non Af Amer: 72 mL/min/{1.73_m2} (ref >59)
GFR, EST AFRICAN AMERICAN: 83 mL/min/{1.73_m2} (ref >59)
Glucose: 87 mg/dL (ref 65–99)
Potassium: 4.4 mmol/L (ref 3.5–5.2)
Sodium: 138 mmol/L (ref 134–144)

## 2017-07-22 LAB — HEPATIC FUNCTION PANEL
ALBUMIN: 4.1 g/dL (ref 3.5–4.8)
ALT: 12 IU/L (ref 0–44)
AST: 11 IU/L (ref 0–40)
Alkaline Phosphatase: 96 IU/L (ref 39–117)
Bilirubin Total: 0.4 mg/dL (ref 0.0–1.2)
Bilirubin, Direct: 0.11 mg/dL (ref 0.00–0.40)
TOTAL PROTEIN: 6.7 g/dL (ref 6.0–8.5)

## 2017-07-22 LAB — LIPID PANEL W/O CHOL/HDL RATIO
Cholesterol, Total: 172 mg/dL (ref 100–199)
HDL: 37 mg/dL — AB (ref >39)
LDL CALC: 105 mg/dL — AB (ref 0–99)
TRIGLYCERIDES: 152 mg/dL — AB (ref 0–149)
VLDL CHOLESTEROL CAL: 30 mg/dL (ref 5–40)

## 2017-07-22 MED ORDER — LISINOPRIL-HYDROCHLOROTHIAZIDE 20-12.5 MG PO TABS: 1.0000 | ORAL_TABLET | Freq: Every day | ORAL | 3 refills | Status: DC

## 2017-07-22 NOTE — Patient Instructions (Signed)
Continue your current therapy  We will check blood work today  I will see you in one year

## 2018-06-14 ENCOUNTER — Ambulatory Visit: Payer: Medicare HMO | Admitting: Cardiology

## 2018-06-14 ENCOUNTER — Encounter: Payer: Self-pay | Admitting: Cardiology

## 2018-06-14 VITALS — BP 114/58 | HR 77 | Ht 69.0 in | Wt 163.0 lb

## 2018-06-14 DIAGNOSIS — I871 Compression of vein: Secondary | ICD-10-CM | POA: Diagnosis not present

## 2018-06-14 DIAGNOSIS — I10 Essential (primary) hypertension: Secondary | ICD-10-CM

## 2018-06-14 MED ORDER — LISINOPRIL-HYDROCHLOROTHIAZIDE 20-12.5 MG PO TABS: 1.0000 | ORAL_TABLET | Freq: Every day | ORAL | 3 refills | Status: DC

## 2018-06-14 NOTE — Progress Notes (Signed)
George Dyer Date of Birth: August 04, 1940 Medical Record #[id number]  History of Present Illness: [facility] Dyer is seen for yearly followup of hypertension. He also has a history of SVC syndrome.    He reports he is doing well. He denies any chest pain or SOB. He does complain of pain in his legs at night. 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  Current Outpatient Medications on File Prior to Visit  Medication Sig Dispense Refill  . lansoprazole (PREVACID) 30 MG capsule Take 1 capsule (30 mg total) by mouth daily. 90 capsule 3   No current facility-administered medications on file prior to visit.     Allergies  Allergen Reactions  . Percocet [Oxycodone-Acetaminophen] Nausea And Vomiting  . Oxycodone-Aspirin Nausea And Vomiting    Past Medical History:  Diagnosis Date  . Arthritis   . Constipation   . ED (erectile dysfunction)   . GERD (gastroesophageal reflux disease)   . Headache   . Hypertension   . Kidney stone   . Lumbar disc disease   . Pinched nerve   . Pneumonia   . SVC syndrome    SECONDARY TO CHRONIC FIBROSING MEDIASTINITIS    Past Surgical History:  Procedure Laterality Date  . BREAST BIOPSY     RIGHT BREAST  . CARDIAC SURGERY  1970's   wife wasn't sure what type of heart surgery.  . COLONOSCOPY    . INGUINAL HERNIA REPAIR  07/10/2012   Procedure: LAPAROSCOPIC INGUINAL HERNIA;  Surgeon: Ralene Ok, MD;  Location: [place];  Service: General;  Laterality: Right;  . INGUINAL HERNIA REPAIR  07/10/12   RIH  . INSERTION OF MESH  07/10/2012   Procedure: INSERTION OF MESH;  Surgeon: Ralene Ok, MD;  Location: [place];  Service: General;  Laterality: Right;  . LUMBAR DISC SURGERY    . ORIF HUMERUS FRACTURE Left 01/13/2016   Procedure: OPEN REDUCTION INTERNAL FIXATION (ORIF) LEFT PROXIMAL HUMERUS FRACTURE;  Surgeon: Roseanne Kaufman, MD;  Location: [place];  Service: Orthopedics;  Laterality: Left;  . TONSILLECTOMY    . US ECHOCARDIOGRAPHY   04/20/1993   EF 65%    Social History   Tobacco Use  Smoking Status Former Smoker  . Packs/day: 0.25  . Years: 6.00  . Pack years: 1.50  . Types: Cigarettes  . Last attempt to quit: 03/25/1981  . Years since quitting: 37.2  Smokeless Tobacco Never Used    Social History   Substance and Sexual Activity  Alcohol Use No    Family History  Problem Relation Age of Onset  . Hypertension Mother   . Heart attack Father   . Heart disease Father   . Cancer Maternal Aunt        breast  . Cancer Maternal Uncle     Review of Systems: As noted in history of present illness.  All other systems were reviewed and are negative.  Physical Exam: BP (!) 114/58 (BP Location: Left Arm, Patient Position: Sitting, Cuff Size: Normal)   Pulse 77   Ht 5\' 9"  (1.753 m)   Wt 163 lb (73.9 kg)   BMI 24.07 kg/m  GENERAL:  Well appearing WM in NAD HEENT:  PERRL, EOMI, sclera are clear. Oropharynx is clear. NECK:  + jugular venous distention, carotid upstroke brisk and symmetric, no bruits, no thyromegaly or adenopathy LUNGS:  Clear to auscultation bilaterally CHEST:  Prominent Venous collaterals HEART:  RRR,  PMI not displaced or sustained,S1 and S2  within normal limits, no S3, no S4: no clicks, no rubs, no murmurs ABD:  Soft, nontender. BS +, no masses or bruits. No hepatomegaly, no splenomegaly EXT:  2 + pulses throughout, no edema, no cyanosis no clubbing SKIN:  Warm and dry.  No rashes NEURO:  Alert and oriented x 3. Cranial nerves II through XII intact. PSYCH:  Cognitively intact    LABORATORY DATA:  Lab Results  Component Value Date   WBC 8.0 01/14/2016   HGB 8.3 (L) 01/14/2016   HCT 27.1 (L) 01/14/2016   PLT 310 01/14/2016   GLUCOSE 87 07/22/2017   CHOL 172 07/22/2017   TRIG 152 (H) 07/22/2017   HDL 37 (L) 07/22/2017   LDLDIRECT 129.0 04/14/2012   LDLCALC 105 (H) 07/22/2017   ALT 12 07/22/2017   AST 11 07/22/2017   NA 138 07/22/2017   K 4.4 07/22/2017   CL 99 07/22/2017    CREATININE 1.02 07/22/2017   BUN 11 07/22/2017   CO2 26 07/22/2017     Assessment / Plan: 1. Hypertension, well-controlled.  Refilled lisinopril HCT. Follow up in one year with labs.  2. 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 Asymptomatic.  Follow up in one year.

## 2019-06-27 ENCOUNTER — Other Ambulatory Visit: Payer: Self-pay

## 2019-06-27 MED ORDER — LISINOPRIL-HYDROCHLOROTHIAZIDE 20-12.5 MG PO TABS: 1.0000 | ORAL_TABLET | Freq: Every day | ORAL | 3 refills | Status: DC

## 2019-07-03 NOTE — Progress Notes (Signed)
Virtual Visit via Telephone Note   This visit type was conducted due to national recommendations for restrictions regarding the COVID-19 Pandemic (e.g. social distancing) in an effort to limit this patient's exposure and mitigate transmission in our community.  Due to his co-morbid illnesses, this patient is at least at moderate risk for complications without adequate follow up.  This format is felt to be most appropriate for this patient at this time.  The patient did not have access to video technology/had technical difficulties with video requiring transitioning to audio format only (telephone).  All issues noted in this document were discussed and addressed.  No physical exam could be performed with this format.  Please refer to the patient's chart for his  consent to telehealth for [facility].   Date:  07/11/2019   ID:  George Dyer, George Dyer 02-02-1941, MRN [id number]  Patient Location: Home Provider Location: Home  PCP:  Jani Gravel, MD  Cardiologist:  Issaih Kaus Martinique MD Electrophysiologist:  None   Evaluation Performed:  Follow-Up Visit  Chief Complaint:  HTN  History of Present Illness:    George Dyer is a 79 y.o. male with history of hypertension. He also has a history of SVC syndrome.    He reports he is doing well. He denies any chest pain or SOB. He does have chronic foot pain that he states started after hernia surgery.  He is still working.   The patient does not have symptoms concerning for COVID-19 infection (fever, chills, cough, or new shortness of breath).    Past Medical History:  Diagnosis Date  . Arthritis   . Constipation   . ED (erectile dysfunction)   . GERD (gastroesophageal reflux disease)   . Headache   . Hypertension   . Kidney stone   . Lumbar disc disease   . Pinched nerve   . Pneumonia   . SVC syndrome    SECONDARY TO CHRONIC FIBROSING MEDIASTINITIS   Past Surgical History:  Procedure Laterality Date  . BREAST BIOPSY     RIGHT  BREAST  . CARDIAC SURGERY  1970's   wife wasn't sure what type of heart surgery.  . COLONOSCOPY    . INGUINAL HERNIA REPAIR  07/10/2012   Procedure: LAPAROSCOPIC INGUINAL HERNIA;  Surgeon: Ralene Ok, MD;  Location: [place];  Service: General;  Laterality: Right;  . INGUINAL HERNIA REPAIR  07/10/12   RIH  . INSERTION OF MESH  07/10/2012   Procedure: INSERTION OF MESH;  Surgeon: Ralene Ok, MD;  Location: [place];  Service: General;  Laterality: Right;  . LUMBAR DISC SURGERY    . ORIF HUMERUS FRACTURE Left 01/13/2016   Procedure: OPEN REDUCTION INTERNAL FIXATION (ORIF) LEFT PROXIMAL HUMERUS FRACTURE;  Surgeon: Roseanne Kaufman, MD;  Location: [place];  Service: Orthopedics;  Laterality: Left;  . TONSILLECTOMY    . US ECHOCARDIOGRAPHY  04/20/1993   EF 65%     Current Meds  Medication Sig  . lisinopril-hydrochlorothiazide (ZESTORETIC) 20-12.5 MG tablet Take 1 tablet by mouth daily.     Allergies:   Percocet [oxycodone-acetaminophen] and Oxycodone-aspirin   Social History   Tobacco Use  . Smoking status: Former Smoker    Packs/day: 0.25    Years: 6.00    Pack years: 1.50    Types: Cigarettes    Quit date: 03/25/1981    Years since quitting: 38.3  . Smokeless tobacco: Never Used  Substance Use Topics  . Alcohol use: No  . Drug use: No  Family Hx: The patient's family history includes Cancer in his maternal aunt and maternal uncle; Heart attack in his father; Heart disease in his father; Hypertension in his mother.  ROS:   Please see the history of present illness.    All other systems reviewed and are negative.   Prior CV studies:   The following studies were reviewed today:  none  Labs/Other Tests and Data Reviewed:    EKG:  No ECG reviewed.  Recent Labs: No results found for requested labs within last 8760 hours.   Recent Lipid Panel Lab Results  Component Value Date/Time   CHOL 172 07/22/2017 08:26 AM   TRIG 152 (H) 07/22/2017 08:26 AM   HDL 37 (L)  07/22/2017 08:26 AM   CHOLHDL 4.9 06/27/2015 08:37 AM   LDLCALC 105 (H) 07/22/2017 08:26 AM   LDLDIRECT 129.0 04/14/2012 10:18 AM    Wt Readings from Last 3 Encounters:  07/11/19 163 lb (73.9 kg)  06/14/18 163 lb (73.9 kg)  07/22/17 161 lb (73 kg)     Objective:    Vital Signs:  BP 133/70   Pulse 82   Ht 5\' 9"  (1.753 m)   Wt 163 lb (73.9 kg)   BMI 24.07 kg/m    VITAL SIGNS:  reviewed  ASSESSMENT & PLAN:    1. Hypertension, well-controlled.  Refilled lisinopril HCT. Recommend fasting lab work including chemistries.  2. 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 Asymptomatic.  COVID-19 Education: The signs and symptoms of COVID-19 were discussed with the patient and how to seek care for testing (follow up with PCP or arrange E-visit).  The importance of social distancing was discussed today.  Time:   Today, I have spent 5 minutes with the patient with telehealth technology discussing the above problems.     Medication Adjustments/Labs and Tests Ordered: Current medicines are reviewed at length with the patient today.  Concerns regarding medicines are outlined above.   Tests Ordered: No orders of the defined types were placed in this encounter.   Medication Changes: No orders of the defined types were placed in this encounter.   Follow Up:  Either In Person or Virtual in 1 year(s)  Signed, Yessika Otte Martin[place], MD  07/11/2019 8:02 AM    [place]

## 2019-07-11 ENCOUNTER — Encounter: Payer: Self-pay | Admitting: Cardiology

## 2019-07-11 ENCOUNTER — Ambulatory Visit: Payer: Medicare HMO | Admitting: Cardiology

## 2019-07-11 ENCOUNTER — Telehealth (INDEPENDENT_AMBULATORY_CARE_PROVIDER_SITE_OTHER): Payer: Medicare HMO | Admitting: Cardiology

## 2019-07-11 VITALS — BP 133/70 | HR 82 | Ht 69.0 in | Wt 163.0 lb

## 2019-07-11 DIAGNOSIS — I10 Essential (primary) hypertension: Secondary | ICD-10-CM | POA: Diagnosis not present

## 2019-07-11 DIAGNOSIS — I871 Compression of vein: Secondary | ICD-10-CM

## 2019-07-11 NOTE — Patient Instructions (Signed)
Medication Instructions:  Continue same medications *If you need a refill on your cardiac medications before your next appointment, please call your pharmacy*  Lab Work: Fasting lab bmet,lipid and hepatic panels   Testing/Procedures: None ordered  Follow-Up: At [facility], you and your health needs are our priority.  As part of our continuing mission to provide you with exceptional heart care, we have created designated Provider Care Teams.  These Care Teams include your primary Cardiologist (physician) and Advanced Practice Providers (APPs -  Physician Assistants and Nurse Practitioners) who all work together to provide you with the care you need, when you need it.  Your next appointment:  1 year    Call in Nov to schedule Feb appointment   The format for your next appointment: Office   Provider:  Dr.Jordan

## 2019-07-11 NOTE — Addendum Note (Signed)
Addended by: Kathyrn Lass on: 07/11/2019 10:40 AM   Modules accepted: Orders

## 2020-06-04 ENCOUNTER — Other Ambulatory Visit: Payer: Self-pay | Admitting: Cardiology

## 2020-07-16 NOTE — Progress Notes (Signed)
Virtual Visit via Telephone Note   This visit type was conducted due to national recommendations for restrictions regarding the COVID-19 Pandemic (e.g. social distancing) in an effort to limit this patient's exposure and mitigate transmission in our community.  Due to his co-morbid illnesses, this patient is at least at moderate risk for complications without adequate follow up.  This format is felt to be most appropriate for this patient at this time.  The patient did not have access to video technology/had technical difficulties with video requiring transitioning to audio format only (telephone).  All issues noted in this document were discussed and addressed.  No physical exam could be performed with this format.  Please refer to the patient's chart for his  consent to telehealth for [facility].    Date:  07/16/2020   ID:  George Dyer, DOB 1941-05-19, MRN [id number] The patient was identified using 2 identifiers.  Patient Location: Home Provider Location: Office/Clinic  PCP:  Jani Gravel, MD  Cardiologist:  Jafeth Mustin Martinique MD Electrophysiologist:  None   Evaluation Performed:  Follow-Up Visit  Chief Complaint:  HTN  History of Present Illness:    George Dyer is a 80 y.o. male with history of hypertension. He also has a history of SVC syndrome.   He reports he is doing well. He denies any chest pain or SOB.He does have chronic foot pain that he states started after hernia surgery.  He is still working. BP has been well controlled. No complaints.   The patient does not have symptoms concerning for COVID-19 infection (fever, chills, cough, or new shortness of breath).    Past Medical History:  Diagnosis Date  . Arthritis   . Constipation   . ED (erectile dysfunction)   . GERD (gastroesophageal reflux disease)   . Headache   . Hypertension   . Kidney stone   . Lumbar disc disease   . Pinched nerve   . Pneumonia   . SVC syndrome    SECONDARY TO CHRONIC  FIBROSING MEDIASTINITIS   Past Surgical History:  Procedure Laterality Date  . BREAST BIOPSY     RIGHT BREAST  . CARDIAC SURGERY  1970's   wife wasn't sure what type of heart surgery.  . COLONOSCOPY    . INGUINAL HERNIA REPAIR  07/10/2012   Procedure: LAPAROSCOPIC INGUINAL HERNIA;  Surgeon: Ralene Ok, MD;  Location: [place];  Service: General;  Laterality: Right;  . INGUINAL HERNIA REPAIR  07/10/12   RIH  . INSERTION OF MESH  07/10/2012   Procedure: INSERTION OF MESH;  Surgeon: Ralene Ok, MD;  Location: [place];  Service: General;  Laterality: Right;  . LUMBAR DISC SURGERY    . ORIF HUMERUS FRACTURE Left 01/13/2016   Procedure: OPEN REDUCTION INTERNAL FIXATION (ORIF) LEFT PROXIMAL HUMERUS FRACTURE;  Surgeon: Roseanne Kaufman, MD;  Location: [place];  Service: Orthopedics;  Laterality: Left;  . TONSILLECTOMY    . US ECHOCARDIOGRAPHY  04/20/1993   EF 65%     No outpatient medications have been marked as taking for the 07/18/20 encounter (Appointment) with Martinique, Raynell Scott M, MD.     Allergies:   Percocet [oxycodone-acetaminophen] and Oxycodone-aspirin   Social History   Tobacco Use  . Smoking status: Former Smoker    Packs/day: 0.25    Years: 6.00    Pack years: 1.50    Types: Cigarettes    Quit date: 03/25/1981    Years since quitting: 39.3  . Smokeless tobacco: Never Used  Substance  Use Topics  . Alcohol use: No  . Drug use: No     Family Hx: The patient's family history includes Cancer in his maternal aunt and maternal uncle; Heart attack in his father; Heart disease in his father; Hypertension in his mother.  ROS:   Please see the history of present illness.    none All other systems reviewed and are negative.   Prior CV studies:   The following studies were reviewed today:  none  Labs/Other Tests and Data Reviewed:    EKG:  No ECG reviewed.  Recent Labs: No results found for requested labs within last 8760 hours.   Recent Lipid Panel Lab Results   Component Value Date/Time   CHOL 172 07/22/2017 08:26 AM   TRIG 152 (H) 07/22/2017 08:26 AM   HDL 37 (L) 07/22/2017 08:26 AM   CHOLHDL 4.9 06/27/2015 08:37 AM   LDLCALC 105 (H) 07/22/2017 08:26 AM   LDLDIRECT 129.0 04/14/2012 10:18 AM    Wt Readings from Last 3 Encounters:  07/11/19 163 lb (73.9 kg)  06/14/18 163 lb (73.9 kg)  07/22/17 161 lb (73 kg)     Risk Assessment/Calculations:      Objective:    Vital Signs:  There were no vitals taken for this visit.   VITAL SIGNS:  reviewed  ASSESSMENT & PLAN:    1.  Hypertension, well-controlled. Refilled lisinopril HCT. Recommend fasting lab work including chemistries.  2. 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 Asymptomatic.    COVID-19 Education: The signs and symptoms of COVID-19 were discussed with the patient and how to seek care for testing (follow up with PCP or arrange E-visit).  The importance of social distancing was discussed today.  Time:   Today, I have spent 5 minutes with the patient with telehealth technology discussing the above problems.     Medication Adjustments/Labs and Tests Ordered: Current medicines are reviewed at length with the patient today.  Concerns regarding medicines are outlined above.   Tests Ordered: No orders of the defined types were placed in this encounter.   Medication Changes: No orders of the defined types were placed in this encounter.   Follow Up:  In Person in 1 year(s)  Signed, Amel Gianino Martinique, MD  07/16/2020 7:32 AM    [facility]

## 2020-07-18 ENCOUNTER — Encounter: Payer: Self-pay | Admitting: Cardiology

## 2020-07-18 ENCOUNTER — Telehealth (INDEPENDENT_AMBULATORY_CARE_PROVIDER_SITE_OTHER): Payer: Medicare HMO | Admitting: Cardiology

## 2020-07-18 ENCOUNTER — Telehealth: Payer: Self-pay

## 2020-07-18 VITALS — BP 117/73 | HR 77 | Ht 69.0 in | Wt 158.0 lb

## 2020-07-18 DIAGNOSIS — I10 Essential (primary) hypertension: Secondary | ICD-10-CM | POA: Diagnosis not present

## 2020-07-18 DIAGNOSIS — I871 Compression of vein: Secondary | ICD-10-CM

## 2020-07-18 NOTE — Telephone Encounter (Signed)
  Patient Consent for Virtual Visit         George Dyer has provided verbal consent on 07/18/2020 for a virtual visit (video or telephone).   CONSENT FOR VIRTUAL VISIT FOR:  George Dyer  By participating in this virtual visit I agree to the following:  I hereby voluntarily request, consent and authorize [facility] and its employed or contracted physicians, physician assistants, nurse practitioners or other licensed health care professionals (the Practitioner), to provide me with telemedicine health care services (the "Services") as deemed necessary by the treating Practitioner. I acknowledge and consent to receive the Services by the Practitioner via telemedicine. I understand that the telemedicine visit will involve communicating with the Practitioner through live audiovisual communication technology and the disclosure of certain medical information by electronic transmission. I acknowledge that I have been given the opportunity to request an in-person assessment or other available alternative prior to the telemedicine visit and am voluntarily participating in the telemedicine visit.  I understand that I have the right to withhold or withdraw my consent to the use of telemedicine in the course of my care at any time, without affecting my right to future care or treatment, and that the Practitioner or I may terminate the telemedicine visit at any time. I understand that I have the right to inspect all information obtained and/or recorded in the course of the telemedicine visit and may receive copies of available information for a reasonable fee.  I understand that some of the potential risks of receiving the Services via telemedicine include:  Marland Kitchen Delay or interruption in medical evaluation due to technological equipment failure or disruption; . Information transmitted may not be sufficient (e.g. poor resolution of images) to allow for appropriate medical decision making by the  Practitioner; and/or  . In rare instances, security protocols could fail, causing a breach of personal health information.  Furthermore, I acknowledge that it is my responsibility to provide information about my medical history, conditions and care that is complete and accurate to the best of my ability. I acknowledge that Practitioner's advice, recommendations, and/or decision may be based on factors not within their control, such as incomplete or inaccurate data provided by me or distortions of diagnostic images or specimens that may result from electronic transmissions. I understand that the practice of medicine is not an exact science and that Practitioner makes no warranties or guarantees regarding treatment outcomes. I acknowledge that a copy of this consent can be made available to me via my patient portal ([place]), or I can request a printed copy by calling the office of [place].    I understand that my insurance will be billed for this visit.   I have read or had this consent read to me. . I understand the contents of this consent, which adequately explains the benefits and risks of the Services being provided via telemedicine.  . I have been provided ample opportunity to ask questions regarding this consent and the Services and have had my questions answered to my satisfaction. . I give my informed consent for the services to be provided through the use of telemedicine in my medical care

## 2020-07-18 NOTE — Addendum Note (Signed)
Addended by: Kathyrn Lass on: 07/18/2020 09:05 AM   Modules accepted: Orders

## 2020-07-18 NOTE — Patient Instructions (Signed)
Medication Instructions:  Continue same medications *If you need a refill on your cardiac medications before your next appointment, please call your pharmacy*   Lab Work: Fasting bmet,lipid and hepatic panels  Lab order enclosed   Testing/Procedures: None ordered   Follow-Up: At [facility], you and your health needs are our priority.  As part of our continuing mission to provide you with exceptional heart care, we have created designated Provider Care Teams.  These Care Teams include your primary Cardiologist (physician) and Advanced Practice Providers (APPs -  Physician Assistants and Nurse Practitioners) who all work together to provide you with the care you need, when you need it.  We recommend signing up for the patient portal called "MyChart".  Sign up information is provided on this After Visit Summary.  MyChart is used to connect with patients for Virtual Visits (Telemedicine).  Patients are able to view lab/test results, encounter notes, upcoming appointments, etc.  Non-urgent messages can be sent to your provider as well.   To learn more about what you can do with MyChart, go to [email or web address].    Your next appointment:  1 year    Call in Nov to schedule Feb appointment   The format for your next appointment: Office    Provider: Dr.Jordan

## 2020-07-23 DIAGNOSIS — I10 Essential (primary) hypertension: Secondary | ICD-10-CM | POA: Diagnosis not present

## 2020-07-23 DIAGNOSIS — I871 Compression of vein: Secondary | ICD-10-CM | POA: Diagnosis not present

## 2020-07-23 LAB — LIPID PANEL
Chol/HDL Ratio: 4.5 ratio (ref 0.0–5.0)
Cholesterol, Total: 189 mg/dL (ref 100–199)
HDL: 42 mg/dL (ref >39)
LDL Chol Calc (NIH): 125 mg/dL — ABNORMAL HIGH (ref 0–99)
Triglycerides: 123 mg/dL (ref 0–149)
VLDL Cholesterol Cal: 22 mg/dL (ref 5–40)

## 2020-07-23 LAB — HEPATIC FUNCTION PANEL
ALT: 12 IU/L (ref 0–44)
AST: 15 IU/L (ref 0–40)
Albumin: 4.1 g/dL (ref 3.7–4.7)
Alkaline Phosphatase: 78 IU/L (ref 44–121)
Bilirubin Total: 0.5 mg/dL (ref 0.0–1.2)
Bilirubin, Direct: 0.11 mg/dL (ref 0.00–0.40)
Total Protein: 6.7 g/dL (ref 6.0–8.5)

## 2020-07-23 LAB — BASIC METABOLIC PANEL
BUN/Creatinine Ratio: 10 (ref 10–24)
BUN: 10 mg/dL (ref 8–27)
CO2: 26 mmol/L (ref 20–29)
Calcium: 9.6 mg/dL (ref 8.6–10.2)
Chloride: 101 mmol/L (ref 96–106)
Creatinine, Ser: 1.04 mg/dL (ref 0.76–1.27)
GFR calc Af Amer: 79 mL/min/{1.73_m2} (ref >59)
GFR calc non Af Amer: 68 mL/min/{1.73_m2} (ref >59)
Glucose: 87 mg/dL (ref 65–99)
Potassium: 4.6 mmol/L (ref 3.5–5.2)
Sodium: 139 mmol/L (ref 134–144)

## 2021-04-09 ENCOUNTER — Ambulatory Visit
Admission: RE | Admit: 2021-04-09 | Discharge: 2021-04-09 | Disposition: A | Discharge status: Home / Self Care | Payer: Medicare HMO | Source: Ambulatory Visit | Attending: Gastroenterology | Admitting: Gastroenterology

## 2021-04-09 ENCOUNTER — Other Ambulatory Visit: Payer: Self-pay

## 2021-04-09 ENCOUNTER — Other Ambulatory Visit: Payer: Self-pay | Admitting: Gastroenterology

## 2021-04-09 DIAGNOSIS — R1030 Lower abdominal pain, unspecified: Secondary | ICD-10-CM | POA: Diagnosis not present

## 2021-04-09 DIAGNOSIS — K5900 Constipation, unspecified: Secondary | ICD-10-CM | POA: Diagnosis not present

## 2021-04-09 DIAGNOSIS — R109 Unspecified abdominal pain: Secondary | ICD-10-CM | POA: Diagnosis not present

## 2021-04-09 DIAGNOSIS — Z1211 Encounter for screening for malignant neoplasm of colon: Secondary | ICD-10-CM | POA: Diagnosis not present

## 2021-05-29 ENCOUNTER — Other Ambulatory Visit: Payer: Self-pay | Admitting: Cardiology

## 2021-06-18 ENCOUNTER — Other Ambulatory Visit: Payer: Self-pay

## 2021-06-18 ENCOUNTER — Encounter: Payer: Self-pay | Admitting: Gastroenterology

## 2021-06-18 ENCOUNTER — Other Ambulatory Visit (INDEPENDENT_AMBULATORY_CARE_PROVIDER_SITE_OTHER): Payer: Medicare HMO

## 2021-06-18 ENCOUNTER — Ambulatory Visit (INDEPENDENT_AMBULATORY_CARE_PROVIDER_SITE_OTHER): Payer: Medicare HMO | Admitting: Gastroenterology

## 2021-06-18 VITALS — BP 138/70 | HR 75 | Ht 69.0 in | Wt 155.2 lb

## 2021-06-18 DIAGNOSIS — I871 Compression of vein: Secondary | ICD-10-CM | POA: Diagnosis not present

## 2021-06-18 DIAGNOSIS — R1319 Other dysphagia: Secondary | ICD-10-CM

## 2021-06-18 DIAGNOSIS — K219 Gastro-esophageal reflux disease without esophagitis: Secondary | ICD-10-CM | POA: Diagnosis not present

## 2021-06-18 DIAGNOSIS — K5900 Constipation, unspecified: Secondary | ICD-10-CM

## 2021-06-18 DIAGNOSIS — R1030 Lower abdominal pain, unspecified: Secondary | ICD-10-CM

## 2021-06-18 MED ORDER — CLENPIQ 10-3.5-12 MG-GM -GM/160ML PO SOLN: 1.0000 | Freq: Once | ORAL | 0 refills | Status: AC

## 2021-06-18 MED ORDER — OMEPRAZOLE 40 MG PO CPDR: 40.0000 mg | DELAYED_RELEASE_CAPSULE | Freq: Every day | ORAL | 3 refills | Status: DC

## 2021-06-18 NOTE — Progress Notes (Signed)
Chief Complaint:   Referring Provider:  Jani Gravel, MD      ASSESSMENT AND PLAN;   #1. Lower abdo pain with constipation/wt loss  #2. Eso dysphagia with GERD. D/d includes eso stricture, Schatzki's ring, motility disorder, EoE, pill induced esophagitis, r/o esophageal Ca or extrinsic lesions. I am also worried about dysphagia [place]-given he has prominent chest wall veins.  #3. SVC syndrome d/t chronic fibrosing mediastinitis  Plan: -Miralax 17g po BID -CBC, CMP, TSH,  -CT chest/abdo/pelvis with PO and IV contrast -Omeprazole 40mg  po qd #90 -After the above work-up is complete, EGD/colon with 2 day prep   HPI:    George Dyer is a 81 y.o. 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 Martinique) [place] by his 2 daughters Admits that he does not like to see doctors  With lower abdominal pain, longstanding constipation which has been worse over last constipation x 2 months.  He does complain of abdominal bloating.  He has used multiple over-the-counter laxatives with minimal help.  Underwent x-ray KUB which showed significant stool in the right colon.  No obstruction.  Also has been having solid food dysphagia x 2 years, intermittent yet progressive, mid chest, associated with heartburn for years.  At times "the food gets stuck" and he has to throw up.  Lately has been having problems with liquids as well.  When that happens, he also has chest discomfort.  Denies having any shortness of breath, wheezing or cough.  Lost 10lb over 2 months.  " Feels like he is slowly dying".  Very distressed with his symptoms.  No sodas, chocolates, chewing gums, artificial sweeteners and candy. No NSAIDs   Past Medical History:  Diagnosis Date   Arthritis    Constipation    ED (erectile dysfunction)    GERD (gastroesophageal reflux disease)    Headache    Hypertension    Kidney stone     Lumbar disc disease    Pinched nerve    Pneumonia    SVC syndrome    SECONDARY TO CHRONIC FIBROSING MEDIASTINITIS    Past Surgical History:  Procedure Laterality Date   BREAST BIOPSY     RIGHT BREAST   CARDIAC SURGERY  1970's   wife wasn't sure what type of heart surgery.   COLONOSCOPY     INGUINAL HERNIA REPAIR  07/10/2012   Procedure: LAPAROSCOPIC INGUINAL HERNIA;  Surgeon: Ralene Ok, MD;  Location: [place];  Service: General;  Laterality: Right;   INGUINAL HERNIA REPAIR  07/10/12   RIH   INSERTION OF MESH  07/10/2012   Procedure: INSERTION OF MESH;  Surgeon: Ralene Ok, MD;  Location: [place];  Service: General;  Laterality: Right;   LUMBAR DISC SURGERY     ORIF HUMERUS FRACTURE Left 01/13/2016   Procedure: OPEN REDUCTION INTERNAL FIXATION (ORIF) LEFT PROXIMAL HUMERUS FRACTURE;  Surgeon: Roseanne Kaufman, MD;  Location: [place];  Service: Orthopedics;  Laterality: Left;   TONSILLECTOMY     US ECHOCARDIOGRAPHY  04/20/1993   EF 65%    Family History  Problem Relation Age of Onset   Hypertension Mother    Heart attack Father    Heart disease Father    Cancer Maternal Aunt        breast   Cancer Maternal Uncle    Colon cancer Neg Hx    Stomach cancer Neg Hx    Rectal cancer Neg  Hx     Social History   Tobacco Use   Smoking status: Former    Packs/day: 0.25    Years: 6.00    Pack years: 1.50    Types: Cigarettes    Quit date: 03/25/1981    Years since quitting: 40.2   Smokeless tobacco: Never  Vaping Use   Vaping Use: Never used  Substance Use Topics   Alcohol use: No   Drug use: No    Current Outpatient Medications  Medication Sig Dispense Refill   acetaminophen (TYLENOL) 325 MG tablet Take 2 tablets (650 mg total) by mouth every 6 (six) hours as needed.     lisinopril-hydrochlorothiazide (ZESTORETIC) 20-12.5 MG tablet TAKE 1 TABLET BY MOUTH EVERY DAY 90 tablet 3   No current facility-administered medications for this visit.    Allergies  Allergen  Reactions   Percocet [Oxycodone-Acetaminophen] Nausea And Vomiting   Oxycodone-Aspirin Nausea And Vomiting    Review of Systems:  Constitutional: Denies fever, chills, diaphoresis, appetite change and fatigue.  HEENT: Denies photophobia, eye pain, redness, hearing loss, ear pain, congestion, sore throat, rhinorrhea, sneezing, mouth sores, neck pain, neck stiffness and tinnitus.   Respiratory: Denies SOB, DOE, cough, chest tightness,  and wheezing.   Cardiovascular: Denies chest pain, palpitations and leg swelling.  Genitourinary: Denies dysuria, urgency, frequency, hematuria, flank pain and difficulty urinating.  Musculoskeletal: Denies myalgias, back pain, joint swelling, arthralgias and gait problem.  Skin: No rash.  Neurological: Denies dizziness, seizures, syncope, weakness, light-headedness, numbness and headaches.  Hematological: Denies adenopathy. Easy bruising, personal or family bleeding history  Psychiatric/Behavioral: Has situational anxiety. No depression     Physical Exam:    BP 138/70    Pulse 75    Ht 5\' 9"  (1.753 m)    Wt 155 lb 4 oz (70.4 kg)    SpO2 99%    BMI 22.93 kg/m  Wt Readings from Last 3 Encounters:  06/18/21 155 lb 4 oz (70.4 kg)  07/18/20 158 lb (71.7 kg)  07/11/19 163 lb (73.9 kg)   Constitutional:  Well-developed, in no acute distress. Psychiatric: Normal mood and affect. Behavior is normal. HEENT: Pupils normal.  Conjunctivae are normal. No scleral icterus. Cardiovascular: Normal rate, regular rhythm. No edema.  Multiple chest scars. Pulmonary/chest: Very prominent chest veins which become further prominent on deep inspiration and holding the breath.  Effort normal and breath sounds normal. No wheezing, rales or rhonchi. Abdominal: Soft, nondistended. Nontender. Bowel sounds active throughout. There are no masses palpable. No hepatomegaly. Rectal: Deferred Neurological: Alert and oriented to person place and time. Skin: Skin is warm and dry. No  rashes noted.  Data Reviewed: I have personally reviewed following labs and imaging studies    CMP: CMP Latest Ref Rng & Units 07/23/2020 07/22/2017 01/13/2016  Glucose 65 - 99 mg/dL 87 87 126(H)  BUN 8 - 27 mg/dL 10 11 -  Creatinine 0.76 - 1.27 mg/dL 1.04 1.02 -  Sodium 134 - 144 mmol/L 139 138 139  Potassium 3.5 - 5.2 mmol/L 4.6 4.4 4.0  Chloride 96 - 106 mmol/L 101 99 -  CO2 20 - 29 mmol/L 26 26 -  Calcium 8.6 - 10.2 mg/dL 9.6 9.7 -  Total Protein 6.0 - 8.5 g/dL 6.7 6.7 -  Total Bilirubin 0.0 - 1.2 mg/dL 0.5 0.4 -  Alkaline Phos 44 - 121 IU/L 78 96 -  AST 0 - 40 IU/L 15 11 -  ALT 0 - 44 IU/L 12 12 -  Carmell Austria, MD 06/18/2021, 2:00 PM  Cc: Jani Gravel, MD

## 2021-06-18 NOTE — Patient Instructions (Addendum)
If you are age 80 or older, your body mass index should be between 23-30. Your Body mass index is 22.93 kg/m. If this is out of the aforementioned range listed, please consider follow up with your Primary Care Provider.  If you are age 48 or younger, your body mass index should be between 19-25. Your Body mass index is 22.93 kg/m. If this is out of the aformentioned range listed, please consider follow up with your Primary Care Provider.   ________________________________________________________  The [facility] providers would like to encourage you to use [facility] to communicate with providers for non-urgent requests or questions.  Due to long hold times on the telephone, sending your provider a message by [facility] may be a faster and more efficient way to get a response.  Please allow 48 business hours for a response.  Please remember that this is for non-urgent requests.  _______________________________________________________  Please go to the lab on the 2nd floor [address] before you leave the office today.   Please purchase the following medications over the counter and take as directed: Miralax 17g 2 times a day  We have sent the following medications to your pharmacy for you to pick up at your convenience: Omeprazole Clenpiq   Two days before your procedure: Mix 3 packs (or capfuls) of Miralax in 48 ounces of clear liquid and drink at 6pm.   Please call in 3 business days to see about scheduling You have been scheduled for a CT scan of the abdomen and pelvis at [facility] ([place], [place] [address] 1st floor Radiology).   You are scheduled on             at            . You should arrive 15 minutes prior to your appointment time for registration. Please follow the written instructions below on the day of your exam:  WARNING: IF YOU ARE ALLERGIC TO IODINE/X-RAY DYE, PLEASE NOTIFY RADIOLOGY IMMEDIATELY AT [id number]! YOU WILL BE GIVEN A 13 HOUR  PREMEDICATION PREP.  1) Do not eat or drink anything after             (4 hours prior to your test) 2) You have been given 2 bottles of oral contrast to drink. The solution may taste better if refrigerated, but do NOT add ice or any other liquid to this solution. Shake well before drinking.    Drink 1 bottle of contrast @           (2 hours prior to your exam)  Drink 1 bottle of contrast @            (1 hour prior to your exam)  You may take any medications as prescribed with a small amount of water, if necessary. If you take any of the following medications: METFORMIN, GLUCOPHAGE, GLUCOVANCE, AVANDAMET, RIOMET, FORTAMET, [place] MET, JANUMET, GLUMETZA or METAGLIP, you MAY be asked to HOLD this medication 48 hours AFTER the exam.  The purpose of you drinking the oral contrast is to aid in the visualization of your intestinal tract. The contrast solution may cause some diarrhea. Depending on your individual set of symptoms, you may also receive an intravenous injection of x-ray contrast/dye. Plan on being at [facility] for 30 minutes or longer, depending on the type of exam you are having performed.  This test typically takes 30-45 minutes to complete.  If you have any questions regarding your exam or if  you need to reschedule, you may call the CT department at [phone] between the hours of 8:00 am and 5:00 pm, Monday-Friday.  ________________________________________________________________________   George Dyer have been scheduled for an endoscopy and colonoscopy. Please follow the written instructions given to you at your visit today. Please pick up your prep supplies at the pharmacy within the next 1-3 days. If you use inhalers (even only as needed), please bring them with you on the day of your procedure.   Please call with any questions or concerns.  Thank you,  Dr. Jackquline Denmark

## 2021-06-19 LAB — CBC WITH DIFFERENTIAL/PLATELET
Basophils Absolute: 0.1 10*3/uL (ref 0.0–0.1)
Basophils Relative: 1.1 % (ref 0.0–3.0)
Eosinophils Absolute: 0.2 10*3/uL (ref 0.0–0.7)
Eosinophils Relative: 2.6 % (ref 0.0–5.0)
HCT: 38.9 % — ABNORMAL LOW (ref 39.0–52.0)
Hemoglobin: 12.2 g/dL — ABNORMAL LOW (ref 13.0–17.0)
Lymphocytes Relative: 19.6 % (ref 12.0–46.0)
Lymphs Abs: 1.3 10*3/uL (ref 0.7–4.0)
MCHC: 31.4 g/dL (ref 30.0–36.0)
MCV: 87 fl (ref 78.0–100.0)
Monocytes Absolute: 0.6 10*3/uL (ref 0.1–1.0)
Monocytes Relative: 8.6 % (ref 3.0–12.0)
Neutro Abs: 4.7 10*3/uL (ref 1.4–7.7)
Neutrophils Relative %: 68.1 % (ref 43.0–77.0)
Platelets: 414 10*3/uL — ABNORMAL HIGH (ref 150.0–400.0)
RBC: 4.47 Mil/uL (ref 4.22–5.81)
RDW: 13.8 % (ref 11.5–15.5)
WBC: 6.9 10*3/uL (ref 4.0–10.5)

## 2021-06-19 LAB — COMPREHENSIVE METABOLIC PANEL
ALT: 11 U/L (ref 0–53)
AST: 14 U/L (ref 0–37)
Albumin: 4.2 g/dL (ref 3.5–5.2)
Alkaline Phosphatase: 88 U/L (ref 39–117)
BUN: 14 mg/dL (ref 6–23)
CO2: 29 mEq/L (ref 19–32)
Calcium: 9.7 mg/dL (ref 8.4–10.5)
Chloride: 100 mEq/L (ref 96–112)
Creatinine, Ser: 1.09 mg/dL (ref 0.40–1.50)
GFR: 64.68 mL/min (ref >60.00)
Glucose, Bld: 101 mg/dL — ABNORMAL HIGH (ref 70–99)
Potassium: 4.8 mEq/L (ref 3.5–5.1)
Sodium: 137 mEq/L (ref 135–145)
Total Bilirubin: 0.3 mg/dL (ref 0.2–1.2)
Total Protein: 6.9 g/dL (ref 6.0–8.3)

## 2021-06-19 LAB — TSH: TSH: 7.11 u[IU]/mL — ABNORMAL HIGH (ref 0.35–5.50)

## 2021-06-21 NOTE — Progress Notes (Signed)
Please inform the pt's daughter All results normal or at baseline except TSH which is mildly elevated 7.1-needs to be addressed by PCP Dr. Maudie Mercury Send report to family physician

## 2021-06-23 ENCOUNTER — Ambulatory Visit (HOSPITAL_BASED_OUTPATIENT_CLINIC_OR_DEPARTMENT_OTHER)
Admission: RE | Admit: 2021-06-23 | Discharge: 2021-06-23 | Disposition: A | Discharge status: Home / Self Care | Payer: Medicare HMO | Source: Ambulatory Visit | Attending: Gastroenterology | Admitting: Gastroenterology

## 2021-06-23 ENCOUNTER — Other Ambulatory Visit: Payer: Self-pay

## 2021-06-23 DIAGNOSIS — K820 Obstruction of gallbladder: Secondary | ICD-10-CM | POA: Diagnosis not present

## 2021-06-23 DIAGNOSIS — N400 Enlarged prostate without lower urinary tract symptoms: Secondary | ICD-10-CM | POA: Diagnosis not present

## 2021-06-23 DIAGNOSIS — I2510 Atherosclerotic heart disease of native coronary artery without angina pectoris: Secondary | ICD-10-CM | POA: Insufficient documentation

## 2021-06-23 DIAGNOSIS — R1319 Other dysphagia: Secondary | ICD-10-CM | POA: Insufficient documentation

## 2021-06-23 DIAGNOSIS — I871 Compression of vein: Secondary | ICD-10-CM | POA: Insufficient documentation

## 2021-06-23 DIAGNOSIS — J984 Other disorders of lung: Secondary | ICD-10-CM | POA: Diagnosis not present

## 2021-06-23 DIAGNOSIS — D7389 Other diseases of spleen: Secondary | ICD-10-CM | POA: Diagnosis not present

## 2021-06-23 DIAGNOSIS — I700 Atherosclerosis of aorta: Secondary | ICD-10-CM | POA: Diagnosis not present

## 2021-06-23 DIAGNOSIS — I82210 Acute embolism and thrombosis of superior vena cava: Secondary | ICD-10-CM | POA: Diagnosis not present

## 2021-06-23 DIAGNOSIS — K5900 Constipation, unspecified: Secondary | ICD-10-CM | POA: Diagnosis not present

## 2021-06-23 DIAGNOSIS — R1030 Lower abdominal pain, unspecified: Secondary | ICD-10-CM | POA: Diagnosis not present

## 2021-06-23 DIAGNOSIS — K219 Gastro-esophageal reflux disease without esophagitis: Secondary | ICD-10-CM | POA: Insufficient documentation

## 2021-06-23 DIAGNOSIS — M47814 Spondylosis without myelopathy or radiculopathy, thoracic region: Secondary | ICD-10-CM | POA: Diagnosis not present

## 2021-06-23 MED ORDER — IOHEXOL 300 MG/ML  SOLN
100.0000 mL | Freq: Once | INTRAMUSCULAR | Status: AC | PRN
  Administered 2021-06-23: 100 mL via INTRAVENOUS

## 2021-06-29 ENCOUNTER — Other Ambulatory Visit: Payer: Self-pay

## 2021-06-29 DIAGNOSIS — R1319 Other dysphagia: Secondary | ICD-10-CM

## 2021-07-07 ENCOUNTER — Other Ambulatory Visit: Payer: Self-pay

## 2021-07-07 ENCOUNTER — Ambulatory Visit (HOSPITAL_COMMUNITY)
Admission: RE | Admit: 2021-07-07 | Discharge: 2021-07-07 | Disposition: A | Discharge status: Home / Self Care | Payer: Medicare HMO | Source: Ambulatory Visit | Attending: Gastroenterology | Admitting: Gastroenterology

## 2021-07-07 DIAGNOSIS — R1319 Other dysphagia: Secondary | ICD-10-CM | POA: Insufficient documentation

## 2021-07-07 DIAGNOSIS — K219 Gastro-esophageal reflux disease without esophagitis: Secondary | ICD-10-CM | POA: Diagnosis not present

## 2021-07-07 DIAGNOSIS — R1310 Dysphagia, unspecified: Secondary | ICD-10-CM | POA: Diagnosis not present

## 2021-07-13 NOTE — Progress Notes (Signed)
Office Visit    Patient Name: George Dyer Date of Encounter: 07/15/2021  Primary Care Provider:  Jani Gravel, MD Primary Cardiologist:  Peter Martinique, MD  Chief Complaint    81 year old male with a history of hypertension, SVC syndrome due to chronic fibrosing mediastinitis, arthritis, chronic foot pain and GERD presents for follow-up related to hypertension and SVC syndrome.  Past Medical History    Past Medical History:  Diagnosis Date   Arthritis    Constipation    ED (erectile dysfunction)    GERD (gastroesophageal reflux disease)    Headache    Hypertension    Kidney stone    Lumbar disc disease    Pinched nerve    Pneumonia    SVC syndrome    SECONDARY TO CHRONIC FIBROSING MEDIASTINITIS   Past Surgical History:  Procedure Laterality Date   BREAST BIOPSY     RIGHT BREAST   CARDIAC SURGERY  1970's   wife wasn't sure what type of heart surgery.   COLONOSCOPY     INGUINAL HERNIA REPAIR  07/10/2012   Procedure: LAPAROSCOPIC INGUINAL HERNIA;  Surgeon: Ralene Ok, MD;  Location: [place];  Service: General;  Laterality: Right;   INGUINAL HERNIA REPAIR  07/10/12   RIH   INSERTION OF MESH  07/10/2012   Procedure: INSERTION OF MESH;  Surgeon: Ralene Ok, MD;  Location: [place];  Service: General;  Laterality: Right;   LUMBAR DISC SURGERY     ORIF HUMERUS FRACTURE Left 01/13/2016   Procedure: OPEN REDUCTION INTERNAL FIXATION (ORIF) LEFT PROXIMAL HUMERUS FRACTURE;  Surgeon: Roseanne Kaufman, MD;  Location: [place];  Service: Orthopedics;  Laterality: Left;   TONSILLECTOMY     US ECHOCARDIOGRAPHY  04/20/1993   EF 65%    Allergies  Allergies  Allergen Reactions   Percocet [Oxycodone-Acetaminophen] Nausea And Vomiting   Oxycodone-Aspirin Nausea And Vomiting    History of Present Illness    81 year old male with the above past medical history including hypertension, SVC syndrome due to chronic fibrosing mediastinitis, arthritis,chronic foot pain and GERD.   History  of occluded SVC in the setting of chronic fibrosing mediastinitis, with collaterals.  He has been asymptomatic years, followed by Dr. Martinique.  He was last seen virtually in February 2022 and was stable from a cardiac standpoint.  He presents today for follow-up.  Since his last visit he has done well from a cardiac standpoint.  He has had concerns with abdominal pain and constipation he is following with Dr. [name], GI and is going to have a EGD/colonoscopy for further evaluation.  Otherwise, he reports feeling well and denies any concerns or complaints today.   Current Outpatient Medications  Medication Sig Dispense Refill   acetaminophen (TYLENOL) 325 MG tablet Take 2 tablets (650 mg total) by mouth every 6 (six) hours as needed.     omeprazole (PRILOSEC) 40 MG capsule Take 1 capsule (40 mg total) by mouth daily. 90 capsule 3   lisinopril-hydrochlorothiazide (ZESTORETIC) 20-12.5 MG tablet Take 1 tablet by mouth daily. 90 tablet 3   No current facility-administered medications for this visit.     Review of Systems    He denies chest pain, palpitations, dyspnea, pnd, orthopnea, n, v, dizziness, syncope, edema, weight gain, or early satiety. All other systems reviewed and are otherwise negative except as noted above.   Physical Exam    VS:  BP (!) 122/54    Pulse 77    Ht 5\' 9"  (1.753 m)  Wt 153 lb 12.8 oz (69.8 kg)    SpO2 96%    BMI 22.71 kg/m   GEN: Well nourished, well developed, in no acute distress. HEENT: normal. Neck: Supple, no JVD, carotid bruits, or masses. Cardiac: RRR, no murmurs, rubs, or gallops. No clubbing, cyanosis, edema.  Radials/DP/PT 2+ and equal bilaterally.  Respiratory:  Respirations regular and unlabored, clear to auscultation bilaterally. GI: Soft, nontender, nondistended, BS + x 4. MS: no deformity or atrophy. Skin: warm and dry, no rash. Neuro:  Strength and sensation are intact. Psych: Normal affect.  Accessory Clinical Findings    ECG personally reviewed  by me today - NSR, 77 bpm- no acute changes.  Lab Results  Component Value Date   WBC 6.9 06/18/2021   HGB 12.2 (L) 06/18/2021   HCT 38.9 (L) 06/18/2021   MCV 87.0 06/18/2021   PLT 414.0 (H) 06/18/2021   Lab Results  Component Value Date   CREATININE 1.09 06/18/2021   BUN 14 06/18/2021   NA 137 06/18/2021   K 4.8 06/18/2021   CL 100 06/18/2021   CO2 29 06/18/2021   Lab Results  Component Value Date   ALT 11 06/18/2021   AST 14 06/18/2021   ALKPHOS 88 06/18/2021   BILITOT 0.3 06/18/2021   Lab Results  Component Value Date   CHOL 189 07/23/2020   HDL 42 07/23/2020   LDLCALC 125 (H) 07/23/2020   LDLDIRECT 129.0 04/14/2012   TRIG 123 07/23/2020   CHOLHDL 4.5 07/23/2020    No results found for: HGBA1C  Assessment & Plan    1. Hypertension: BP well controlled. Recent BMET 06/18/21 unremarkable. Continue current antihypertensive regimen.   2. Superior vena cava syndrome: Secondary to chronic fibrosing mediastinitis. Asymptomatic.  3. Disposition: F/u in 1 year.   Lenna Sciara, NP 07/15/2021, 8:07 AM

## 2021-07-15 ENCOUNTER — Encounter: Payer: Self-pay | Admitting: Physician Assistant

## 2021-07-15 ENCOUNTER — Other Ambulatory Visit: Payer: Self-pay

## 2021-07-15 ENCOUNTER — Ambulatory Visit: Payer: Medicare HMO | Admitting: Nurse Practitioner

## 2021-07-15 VITALS — BP 122/54 | HR 77 | Ht 69.0 in | Wt 153.8 lb

## 2021-07-15 DIAGNOSIS — I10 Essential (primary) hypertension: Secondary | ICD-10-CM

## 2021-07-15 DIAGNOSIS — I871 Compression of vein: Secondary | ICD-10-CM | POA: Diagnosis not present

## 2021-07-15 MED ORDER — LISINOPRIL-HYDROCHLOROTHIAZIDE 20-12.5 MG PO TABS: 1.0000 | ORAL_TABLET | Freq: Every day | ORAL | 3 refills | Status: DC

## 2021-07-15 NOTE — Patient Instructions (Signed)
Medication Instructions:  Your physician recommends that you continue on your current medications as directed. Please refer to the Current Medication list given to you today.  *If you need a refill on your cardiac medications before your next appointment, please call your pharmacy*   Lab Work: None ordered  If you have labs (blood work) drawn today and your tests are completely normal, you will receive your results only by: [place] (if you have MyChart) OR A paper copy in the mail If you have any lab test that is abnormal or we need to change your treatment, we will call you to review the results.   Testing/Procedures: None ordered   Follow-Up: At [facility], you and your health needs are our priority.  As part of our continuing mission to provide you with exceptional heart care, we have created designated Provider Care Teams.  These Care Teams include your primary Cardiologist (physician) and Advanced Practice Providers (APPs -  Physician Assistants and Nurse Practitioners) who all work together to provide you with the care you need, when you need it.  We recommend signing up for the patient portal called "MyChart".  Sign up information is provided on this After Visit Summary.  MyChart is used to connect with patients for Virtual Visits (Telemedicine).  Patients are able to view lab/test results, encounter notes, upcoming appointments, etc.  Non-urgent messages can be sent to your provider as well.   To learn more about what you can do with MyChart, go to [email or web address].    Your next appointment:   12 month(s)  The format for your next appointment:   In Person  Provider:   Peter Martinique, MD     Other Instructions

## 2021-07-30 ENCOUNTER — Encounter: Payer: Self-pay | Admitting: Gastroenterology

## 2021-08-06 ENCOUNTER — Ambulatory Visit (AMBULATORY_SURGERY_CENTER): Payer: Medicare HMO | Admitting: Gastroenterology

## 2021-08-06 ENCOUNTER — Encounter: Payer: Self-pay | Admitting: Gastroenterology

## 2021-08-06 VITALS — BP 101/73 | HR 91 | Temp 98.9°F | Resp 14 | Ht 69.0 in | Wt 155.0 lb

## 2021-08-06 DIAGNOSIS — K640 First degree hemorrhoids: Secondary | ICD-10-CM | POA: Diagnosis not present

## 2021-08-06 DIAGNOSIS — I8500 Esophageal varices without bleeding: Secondary | ICD-10-CM | POA: Diagnosis not present

## 2021-08-06 DIAGNOSIS — K5909 Other constipation: Secondary | ICD-10-CM

## 2021-08-06 DIAGNOSIS — K5730 Diverticulosis of large intestine without perforation or abscess without bleeding: Secondary | ICD-10-CM | POA: Diagnosis not present

## 2021-08-06 DIAGNOSIS — D123 Benign neoplasm of transverse colon: Secondary | ICD-10-CM

## 2021-08-06 DIAGNOSIS — K219 Gastro-esophageal reflux disease without esophagitis: Secondary | ICD-10-CM | POA: Diagnosis not present

## 2021-08-06 DIAGNOSIS — D122 Benign neoplasm of ascending colon: Secondary | ICD-10-CM | POA: Diagnosis not present

## 2021-08-06 DIAGNOSIS — D124 Benign neoplasm of descending colon: Secondary | ICD-10-CM | POA: Diagnosis not present

## 2021-08-06 DIAGNOSIS — R1030 Lower abdominal pain, unspecified: Secondary | ICD-10-CM

## 2021-08-06 DIAGNOSIS — C169 Malignant neoplasm of stomach, unspecified: Secondary | ICD-10-CM

## 2021-08-06 HISTORY — PX: COLONOSCOPY: SHX174

## 2021-08-06 MED ORDER — SODIUM CHLORIDE 0.9 % IV SOLN: 500.0000 mL | Freq: Once | INTRAVENOUS | Status: DC

## 2021-08-06 MED ORDER — FLEET ENEMA 7-19 GM/118ML RE ENEM
1.0000 | ENEMA | Freq: Once | RECTAL | Status: AC
  Administered 2021-08-06: 1 via RECTAL

## 2021-08-06 NOTE — Op Note (Signed)
[place] ?Patient Name: George Dyer ?Procedure Date: 08/06/2021 1:10 PM ?MRN: [id number] ?Endoscopist: Jackquline Denmark , MD ?Age: 81 ?Referring MD:  ?Date of Birth: 1940/11/28 ?Gender: Male ?Account #: [id number] ?Procedure:                Colonoscopy ?Indications:              Generalized abdominal pain. Chronic constipation. ?Medicines:                Monitored Anesthesia Care ?Procedure:                Pre-Anesthesia Assessment: ?                          - Prior to the procedure, a History and Physical  ?                          was performed, and patient medications and  ?                          allergies were reviewed. The patient's tolerance of  ?                          previous anesthesia was also reviewed. The risks  ?                          and benefits of the procedure and the sedation  ?                          options and risks were discussed with the patient.  ?                          All questions were answered, and informed consent  ?                          was obtained. Prior Anticoagulants: The patient has  ?                          taken no previous anticoagulant or antiplatelet  ?                          agents. ASA Grade Assessment: III - A patient with  ?                          severe systemic disease. After reviewing the risks  ?                          and benefits, the patient was deemed in  ?                          satisfactory condition to undergo the procedure. ?                          After obtaining informed consent, the colonoscope  ?  was passed under direct vision. Throughout the  ?                          procedure, the patient's blood pressure, pulse, and  ?                          oxygen saturations were monitored continuously. The  ?                          Olympus PCF-H190DL (#[id number]) Colonoscope was  ?                          introduced through the anus and advanced to the the  ?                          cecum,  identified by appendiceal orifice and  ?                          ileocecal valve. The colonoscopy was performed  ?                          without difficulty. The patient tolerated the  ?                          procedure well. The quality of the bowel  ?                          preparation was adequate to identify polyps. There  ?                          was significant retained stool throughout the colon  ?                          which could not be fully washed. The ileocecal  ?                          valve, appendiceal orifice, and rectum were  ?                          photographed. ?Scope In: 1:42:02 PM ?Scope Out: 2:17:15 PM ?Scope Withdrawal Time: 0 hours 30 minutes 25 seconds  ?Total Procedure Duration: 0 hours 35 minutes 13 seconds  ?Findings:                 12 sessile and semi-sessile polyps were found in  ?                          the proximal transverse colon and ascending colon.  ?                          The polyps were 8 to 15 mm in size. The smaller  ?                          polyps were removed with a cold snare. The  larger  ?                          polyps were removed using a hot snare. The largest  ?                          polyp measuring 15 mm was flat sessile polyp which  ?                          was removed with hot snare using a piecemeal  ?                          polypectomy technique. Resection and retrieval were  ?                          complete. Estimated blood loss: none. ?                          Three sessile polyps were found in the proximal  ?                          descending colon and distal transverse colon. The  ?                          polyps were 6 to 8 mm in size. These polyps were  ?                          removed with a cold snare. Resection and retrieval  ?                          were complete. ?                          Multiple medium-mouthed diverticula were found in  ?                          the sigmoid colon. ?                           Non-bleeding internal hemorrhoids were found during  ?                          retroflexion. The hemorrhoids were small and Grade  ?                          I (internal hemorrhoids that do not prolapse). ?                          The exam was otherwise without abnormality on  ?                          direct and retroflexion views. ?Complications:            No immediate complications. ?Estimated Blood Loss:     Estimated blood loss: none. ?Impression:               -  12 8 to 15 mm polyps in the proximal transverse  ?                          colon and in the ascending colon, removed with a  ?                          cold snare. Resected and retrieved. ?                          - Three 6 to 8 mm polyps in the proximal descending  ?                          colon and in the distal transverse colon, removed  ?                          with a cold snare. Resected and retrieved. ?                          - Diverticulosis in the sigmoid colon. ?                          - Non-bleeding internal hemorrhoids. ?                          - The examination was otherwise normal on direct  ?                          and retroflexion views. ?Recommendation:           - Patient has a contact number available for  ?                          emergencies. The signs and symptoms of potential  ?                          delayed complications were discussed with the  ?                          patient. Return to normal activities tomorrow.  ?                          Written discharge instructions were provided to the  ?                          patient. ?                          - Resume previous diet. ?                          - Miralax 1 capful (17 grams) in 8 ounces of water  ?                          PO daily. ?                          -  No nonsteroidals x 7 days. ?                          - Await pathology results. ?                          - Repeat colonoscopy in 6-12 months for ensure  ?                           complete removal of the polyps depending upon the  ?                          clinical situation. ?                          - The findings and recommendations were discussed  ?                          with the patient's family. ?Jackquline Denmark, MD ?08/06/2021 2:34:54 PM ?This report has been signed electronically. ?

## 2021-08-06 NOTE — Op Note (Signed)
[place] ?Patient Name: George Dyer ?Procedure Date: 08/06/2021 1:10 PM ?MRN: [id number] ?Endoscopist: Jackquline Denmark , MD ?Age: 81 ?Referring MD:  ?Date of Birth: 27-May-1941 ?Gender: Male ?Account #: [id number] ?Procedure:                Upper GI endoscopy ?Indications:              Epigastric abdominal pain ?Medicines:                Monitored Anesthesia Care ?Procedure:                Pre-Anesthesia Assessment: ?                          - Prior to the procedure, a History and Physical  ?                          was performed, and patient medications and  ?                          allergies were reviewed. The patient's tolerance of  ?                          previous anesthesia was also reviewed. The risks  ?                          and benefits of the procedure and the sedation  ?                          options and risks were discussed with the patient.  ?                          All questions were answered, and informed consent  ?                          was obtained. Prior Anticoagulants: The patient has  ?                          taken no previous anticoagulant or antiplatelet  ?                          agents. ASA Grade Assessment: III - A patient with  ?                          severe systemic disease. After reviewing the risks  ?                          and benefits, the patient was deemed in  ?                          satisfactory condition to undergo the procedure. ?                          After obtaining informed consent, the endoscope was  ?  passed under direct vision. Throughout the  ?                          procedure, the patient's blood pressure, pulse, and  ?                          oxygen saturations were monitored continuously. The  ?                          Endoscope was introduced through the mouth, and  ?                          advanced to the second part of duodenum. The upper  ?                          GI endoscopy was accomplished  without difficulty.  ?                          The patient tolerated the procedure well. ?Scope In: ?Scope Out: ?Findings:                 Small Grade I 4 mm varices were found in the upper  ?                          third of the esophagus c/w downhill esophageal  ?                          varices (d/t SVC syndrome). ?                          A large, fungating, infiltrative, sessile and  ?                          ulcerated, non-circumferential mass 3cm x 3 cm with  ?                          heaped up margins was found in the cardia, 2 cm  ?                          below the GE junction (not involving the GE  ?                          junction), along the lesser curvature. Biopsies  ?                          were taken with a cold forceps for histology. ?                          The exam of the stomach was otherwise normal. ?                          The examined duodenum was normal. ?Complications:            No immediate complications. ?Estimated Blood Loss:  Estimated blood loss: none. ?Impression:               - Grade I downhill esophageal varices. ?                          - Likely malignant gastric tumor in the cardia.  ?                          Biopsied. ?                          - Normal examined duodenum. ?Recommendation:           - Patient has a contact number available for  ?                          emergencies. The signs and symptoms of potential  ?                          delayed complications were discussed with the  ?                          patient. Return to normal activities tomorrow.  ?                          Written discharge instructions were provided to the  ?                          patient. ?                          - Resume previous diet. ?                          - Continue present medications. ?                          - Await pathology results. ?                          - The findings and recommendations were discussed  ?                          with the  patient's family. Of note that the CT  ?                          chest Abdo pelvis was negative 06/2021. ?Jackquline Denmark, MD ?08/06/2021 2:27:36 PM ?This report has been signed electronically. ?

## 2021-08-06 NOTE — Progress Notes (Signed)
Pt's states no medical or surgical changes since previsit or office visit.   Vs by CW in adm 

## 2021-08-06 NOTE — Progress Notes (Signed)
Report to PACU, RN, vss, BBS= Clear.  

## 2021-08-06 NOTE — Progress Notes (Signed)
Upon arriving to RR, pt is writhing in pain, clutching abdomen.  He is moaning and restless in the stretcher.  Abdomen distended; he is unable to rate pain.  Just keeps asking "God and Jesus- please help me." ?While in the RR, Levsin 0.125mg  SL given at 1429.  Turned onto right an d left side.  Pt passed large amt of air while in the RR. ? ?At discharge, pt denies abdomen pain.  Abdomen soft and easily palpable. ? ?   ?

## 2021-08-06 NOTE — Progress Notes (Signed)
? ? ?Chief Complaint:  ? ?Referring Provider:  Jani Gravel, MD    ? ? ?ASSESSMENT AND PLAN;  ? ?#1. Lower abdo pain with constipation/wt loss ? ?#2. Eso dysphagia with GERD. D/d includes eso stricture, Schatzki's ring, motility disorder, EoE, pill induced esophagitis, r/o esophageal Ca or extrinsic lesions. I am also worried about dysphagia [place]-given he has prominent chest wall veins. ? ?#3. SVC syndrome d/t chronic fibrosing mediastinitis ? ?Plan: ? ?EGD/colon ? ? ?HPI:   ? ?George Dyer is a 81 y.o. 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 Martinique) ?Accompanied by his 2 daughters ?Admits that he does not like to see doctors ? ?With lower abdominal pain, longstanding constipation which has been worse over last constipation x 2 months.  He does complain of abdominal bloating.  He has used multiple over-the-counter laxatives with minimal help.  Underwent x-ray KUB which showed significant stool in the right colon.  No obstruction. ? ?Also has been having solid food dysphagia x 2 years, intermittent yet progressive, mid chest, associated with heartburn for years.  At times "the food gets stuck" and he has to throw up.  Lately has been having problems with liquids as well.  When that happens, he also has chest discomfort. ? ?Denies having any shortness of breath, wheezing or cough. ? ?Lost 10lb over 2 months. ? ?" Feels like he is slowly dying".  Very distressed with his symptoms. ? ?No sodas, chocolates, chewing gums, artificial sweeteners and candy. No NSAIDs ? ? ?Past Medical History:  ?Diagnosis Date  ? Anxiety   ? Arthritis   ? Constipation   ? ED (erectile dysfunction)   ? GERD (gastroesophageal reflux disease)   ? Headache   ? Hypertension   ? Kidney stone   ? Lumbar disc disease   ? Pinched nerve   ? Pneumonia   ? SVC syndrome   ? SECONDARY TO CHRONIC FIBROSING MEDIASTINITIS  ? ? ?Past Surgical History:   ?Procedure Laterality Date  ? BREAST BIOPSY    ? RIGHT BREAST  ? CARDIAC SURGERY  1970's  ? wife wasn't sure what type of heart surgery.  ? COLONOSCOPY    ? INGUINAL HERNIA REPAIR  07/10/2012  ? Procedure: LAPAROSCOPIC INGUINAL HERNIA;  Surgeon: Ralene Ok, MD;  Location: [facility];  Service: General;  Laterality: Right;  ? INGUINAL HERNIA REPAIR  07/10/12  ? RIH  ? INSERTION OF MESH  07/10/2012  ? Procedure: INSERTION OF MESH;  Surgeon: Ralene Ok, MD;  Location: [facility];  Service: General;  Laterality: Right;  ? LUMBAR [place]    ? ORIF HUMERUS FRACTURE Left 01/13/2016  ? Procedure: OPEN REDUCTION INTERNAL FIXATION (ORIF) LEFT PROXIMAL HUMERUS FRACTURE;  Surgeon: Roseanne Kaufman, MD;  Location: [facility];  Service: Orthopedics;  Laterality: Left;  ? TONSILLECTOMY    ? US ECHOCARDIOGRAPHY  04/20/1993  ? EF 65%  ? ? ?Family History  ?Problem Relation Age of Onset  ? Hypertension Mother   ? Heart attack Father   ? Heart disease Father   ? Cancer Maternal Aunt   ?     breast  ? Cancer Maternal Uncle   ? Colon cancer Neg Hx   ? Stomach cancer Neg Hx   ? Rectal cancer Neg Hx   ? ? ?Social History  ? ?Tobacco Use  ? Smoking status: Former  ?  Packs/day: 0.25  ?  Years:  6.00  ?  Pack years: 1.50  ?  Types: Cigarettes  ?  Quit date: 03/25/1981  ?  Years since quitting: 40.3  ? Smokeless tobacco: Never  ?Vaping Use  ? Vaping Use: Never used  ?Substance Use Topics  ? Alcohol use: No  ? Drug use: No  ? ? ?Current Outpatient Medications  ?Medication Sig Dispense Refill  ? lisinopril-hydrochlorothiazide (ZESTORETIC) 20-12.5 MG tablet Take 1 tablet by mouth daily. 90 tablet 3  ? acetaminophen (TYLENOL) 325 MG tablet Take 2 tablets (650 mg total) by mouth every 6 (six) hours as needed.    ? omeprazole (PRILOSEC) 40 MG capsule Take 1 capsule (40 mg total) by mouth daily. 90 capsule 3  ? ?Current Facility-Administered Medications  ?Medication Dose Route Frequency Provider Last Rate Last Admin  ? 0.9 %  sodium chloride infusion  500 mL  Intravenous Once Jackquline Denmark, MD      ? ? ?Allergies  ?Allergen Reactions  ? Percocet [Oxycodone-Acetaminophen] Nausea And Vomiting  ? Oxycodone-Aspirin Nausea And Vomiting  ? ? ?Review of Systems:  ?Constitutional: Denies fever, chills, diaphoresis, appetite change and fatigue.  ?HEENT: Denies photophobia, eye pain, redness, hearing loss, ear pain, congestion, sore throat, rhinorrhea, sneezing, mouth sores, neck pain, neck stiffness and tinnitus.   ?Respiratory: Denies SOB, DOE, cough, chest tightness,  and wheezing.   ?Cardiovascular: Denies chest pain, palpitations and leg swelling.  ?Genitourinary: Denies dysuria, urgency, frequency, hematuria, flank pain and difficulty urinating.  ?Musculoskeletal: Denies myalgias, back pain, joint swelling, arthralgias and gait problem.  ?Skin: No rash.  ?Neurological: Denies dizziness, seizures, syncope, weakness, light-headedness, numbness and headaches.  ?Hematological: Denies adenopathy. Easy bruising, personal or family bleeding history  ?Psychiatric/Behavioral: Has situational anxiety. No depression ? ?  ? ?Physical Exam:   ? ?BP 132/70   Pulse 81   Temp 98.9 ?F (37.2 ?C)   Ht 5\' 9"  (1.753 m)   Wt 155 lb (70.3 kg)   SpO2 99%   BMI 22.89 kg/m?  ?Wt Readings from Last 3 Encounters:  ?08/06/21 155 lb (70.3 kg)  ?07/15/21 153 lb 12.8 oz (69.8 kg)  ?06/18/21 155 lb 4 oz (70.4 kg)  ? ?Constitutional:  Well-developed, in no acute distress. ?Psychiatric: Normal mood and affect. Behavior is normal. ?HEENT: Pupils normal.  Conjunctivae are normal. No scleral icterus. ?Cardiovascular: Normal rate, regular rhythm. No edema.  Multiple chest scars. ?Pulmonary/chest: Very prominent chest veins which become further prominent on deep inspiration and holding the breath.  Effort normal and breath sounds normal. No wheezing, rales or rhonchi. ?Abdominal: Soft, nondistended. Nontender. Bowel sounds active throughout. There are no masses palpable. No hepatomegaly. ?Rectal:  Deferred ?Neurological: Alert and oriented to person place and time. ?Skin: Skin is warm and dry. No rashes noted. ? ?Data Reviewed: I have personally reviewed following labs and imaging studies ? ? ? ?CMP: ?CMP Latest Ref Rng & Units 06/18/2021 07/23/2020 07/22/2017  ?Glucose 70 - 99 mg/dL 101(H) 87 87  ?BUN 6 - 23 mg/dL 14 10 11   ?Creatinine 0.40 - 1.50 mg/dL 1.09 1.04 1.02  ?Sodium 135 - 145 mEq/L 137 139 138  ?Potassium 3.5 - 5.1 mEq/L 4.8 4.6 4.4  ?Chloride 96 - 112 mEq/L 100 101 99  ?CO2 19 - 32 mEq/L 29 26 26   ?Calcium 8.4 - 10.5 mg/dL 9.7 9.6 9.7  ?Total Protein 6.0 - 8.3 g/dL 6.9 6.7 6.7  ?Total Bilirubin 0.2 - 1.2 mg/dL 0.3 0.5 0.4  ?Alkaline Phos 39 - 117 U/L 88 78 96  ?  AST 0 - 37 U/L 14 15 11   ?ALT 0 - 53 U/L 11 12 12   ? ? ? ? ? ? ?Carmell Austria, MD 08/06/2021, 1:15 PM ? ?Cc: Jani Gravel, MD ? ? ?

## 2021-08-06 NOTE — Patient Instructions (Signed)
Await pathology ? ?No NSAIDS for 7 days (Advil, Aleve, Motrin, Ibuprofen)  ? ?Take Miralax 1 capful (17 grams) in 8 ounces of water daily (can get over the counter) ? ?Continue your normal medications ? ?Please read over handouts about polyps, diverticulosis, hemorrhoids ? ?Next colonoscopy in 6-12 months ? ? ?YOU HAD AN ENDOSCOPIC PROCEDURE TODAY AT [facility]:   Refer to the procedure report that was given to you for any specific questions about what was found during the examination.  If the procedure report does not answer your questions, please call your gastroenterologist to clarify.  If you requested that your care partner not be given the details of your procedure findings, then the procedure report has been included in a sealed envelope for you to review at your convenience later. ? ?YOU SHOULD EXPECT: Some feelings of bloating in the abdomen. Passage of more gas than usual.  Walking can help get rid of the air that was put into your GI tract during the procedure and reduce the bloating. If you had a lower endoscopy (such as a colonoscopy or flexible sigmoidoscopy) you may notice spotting of blood in your stool or on the toilet paper. If you underwent a bowel prep for your procedure, you may not have a normal bowel movement for a few days. ? ?Please Note:  You might notice some irritation and congestion in your nose or some drainage.  This is from the oxygen used during your procedure.  There is no need for concern and it should clear up in a day or so. ? ?SYMPTOMS TO REPORT IMMEDIATELY: ? ?Following lower endoscopy (colonoscopy or flexible sigmoidoscopy): ? Excessive amounts of blood in the stool ? Significant tenderness or worsening of abdominal pains ? Swelling of the abdomen that is new, acute ? Fever of 100?F or higher ? ?Following upper endoscopy (EGD) ? Vomiting of blood or coffee ground material ? New chest pain or pain under the shoulder blades ? Painful or persistently difficult  swallowing ? New shortness of breath ? Fever of 100?F or higher ? Black, tarry-looking stools ? ?For urgent or emergent issues, a gastroenterologist can be reached at any hour by calling [phone]. ?Do not use MyChart messaging for urgent concerns.  ? ? ?DIET:  We do recommend a small meal at first, but then you may proceed to your regular diet.  Drink plenty of fluids but you should avoid alcoholic beverages for 24 hours. ? ?ACTIVITY:  You should plan to take it easy for the rest of today and you should NOT DRIVE or use heavy machinery until tomorrow (because of the sedation medicines used during the test).   ? ?FOLLOW UP: ?Our staff will call the number listed on your records 48-72 hours following your procedure to check on you and address any questions or concerns that you may have regarding the information given to you following your procedure. If we do not reach you, we will leave a message.  We will attempt to reach you two times.  During this call, we will ask if you have developed any symptoms of COVID 19. If you develop any symptoms (ie: fever, flu-like symptoms, shortness of breath, cough etc.) before then, please call [phone].  If you test positive for Covid 19 in the 2 weeks post procedure, please call and report this information to [place].   ? ?If any biopsies were taken you will be contacted by phone or by letter within the next 1-3 weeks.  Please call us at [phone] if you have not heard about the biopsies in 3 weeks.  ? ? ?SIGNATURES/CONFIDENTIALITY: ?You and/or your care partner have signed paperwork which will be entered into your electronic medical record.  These signatures attest to the fact that that the information above on your After Visit Summary has been reviewed and is understood.  Full responsibility of the confidentiality of this discharge information lies with you and/or your care-partner. ? ?

## 2021-08-10 ENCOUNTER — Telehealth: Payer: Self-pay

## 2021-08-10 ENCOUNTER — Telehealth: Payer: Self-pay | Admitting: *Deleted

## 2021-08-10 ENCOUNTER — Other Ambulatory Visit: Payer: Self-pay

## 2021-08-10 DIAGNOSIS — C169 Malignant neoplasm of stomach, unspecified: Secondary | ICD-10-CM

## 2021-08-10 NOTE — Progress Notes (Signed)
I had discussed biopsy results with patient's wife and daughters. ?Bx-   Gastric adenocarcinoma ? had negative CT chest Abdo/pelvis for any metastatic disease Jan 2023 ? ? Plan: ?- oncology navigator to make appointment with Dr. Marin Olp ?- appointment with surgery (Dr Michaelle Birks) ?- EUS with Dr  Mansouraty/Jacobs ? ?RG ? ?Sarah, ?no need for letter. ?RG

## 2021-08-10 NOTE — Telephone Encounter (Signed)
?  Follow up Call- ? ?Call back number 08/06/2021  ?Post procedure Call Back phone  # [phone]  ?Permission to leave phone message Yes  ?Some recent data might be hidden  ?  ? ?Patient questions: ? ?Do you have a fever, pain , or abdominal swelling? No. ?Pain Score  0 * ? ?Have you tolerated food without any problems? Yes.   ? ?Have you been able to return to your normal activities? Yes.   ? ?Do you have any questions about your discharge instructions: ?Diet   No. ?Medications  No. ?Follow up visit  No. ? ?Do you have questions or concerns about your Care? Yes.   ?Spoke with pts wife this morning, she reports George Dyer is doing fine. Waiting on a phone call from Dr. [name] this morning to discuss next steps after the findings from his procedure. ? ?Actions: ?* If pain score is 4 or above: ?No action needed, pain <4. ? ? ?

## 2021-08-10 NOTE — Telephone Encounter (Signed)
EUS has been scheduled for 10/01/21 at 730 am at [facility] with DJ  ? ?EUS scheduled, pt wife instructed and medications reviewed.  Patient instructions mailed to home and sent to My Chart.  Patient to call with any questions or concerns.  ?

## 2021-08-10 NOTE — Telephone Encounter (Signed)
-----   Message from Milus Banister, MD sent at 08/10/2021 11:16 AM EST ----- ?Regarding: RE: EUS for gastric AdenoCa ?Got it, thanks. We'll get him in ? ?Masha Orbach, ?He needs EUS with myself or gabe for gastric cancer staging.  First aailable. Thanks ? ? ?----- Message ----- ?From: Jackquline Denmark, MD ?Sent: 08/10/2021  11:04 AM EST ?To: Milus Banister, MD, # ?Subject: EUS for gastric AdenoCa                       ? ? ?81yrold with Proximal gastric AdenoCa, neg CT Chest/A/P for mets. ? ?Would need EUS for staging, if you all are OK with it. ? ?Arranging appt with oncology/surgery. ? ?Also has chronic longstanding SVC syndrome with downhill esophageal varices (small) ? ?RG ? ? ?

## 2021-08-11 ENCOUNTER — Other Ambulatory Visit: Payer: Self-pay

## 2021-08-11 DIAGNOSIS — C169 Malignant neoplasm of stomach, unspecified: Secondary | ICD-10-CM

## 2021-08-11 NOTE — Telephone Encounter (Signed)
The pt has been moved up to 3/9 at 10 am with GM at [facility].  The pt's daughter has been advised new instructions were sent to My Chart per the daughters request. George Dyer has been advised that the appt was moved up.   ?

## 2021-08-12 ENCOUNTER — Telehealth: Payer: Self-pay

## 2021-08-12 ENCOUNTER — Telehealth: Payer: Self-pay | Admitting: Gastroenterology

## 2021-08-12 ENCOUNTER — Encounter: Payer: Self-pay | Admitting: Gastroenterology

## 2021-08-12 ENCOUNTER — Encounter: Payer: Self-pay | Admitting: *Deleted

## 2021-08-12 NOTE — Telephone Encounter (Signed)
Patient's daughter called.  She said Dr. [name] asked them to find a surgeon for [place] to refer patient to for his stomach cancer.  They have chosen Dr. Jyl Heinz at [facility].  His phone number is [phone] and his fax humber is [phone].  They need this referral expedited asap.  Please call patient or his daughter to let them know once the referral has been submitted so they can go ahead and make the appointment.  Thank you. ?

## 2021-08-12 NOTE — Telephone Encounter (Signed)
This has been done prior off of a  Phone Note: Fax Has been sent: Pt daughter made aware ?

## 2021-08-12 NOTE — Progress Notes (Signed)
Reached out to George Dyer to introduce myself as the office RN Navigator and explain our new patient process. Spoke with his wife, and daughter, George Dyer. Reviewed the reason for their referral and scheduled their new patient appointment along with labs. Provided address and directions to the office including call back phone number. Reviewed with patient any concerns they may have or any possible barriers to attending their appointment.  ? ?Attempted to schedule patient tomorrow or Friday however patient's family wants to schedule for late next week. Scheduled as requested.  ? ?Informed patient about my role as a navigator and that I will meet with them prior to their New Patient appointment and more fully discuss what services I can provide. At this time patient has no further questions or needs.   ? ?Oncology Nurse Navigator Documentation ? ?Oncology Nurse Navigator Flowsheets 08/12/2021  ?Abnormal Finding Date 08/06/2021  ?Confirmed Diagnosis Date 08/06/2021  ?Diagnosis Status Additional Work Up  ?Navigator Follow Up Date: 08/20/2021  ?Navigator Follow Up Reason: New Patient Appointment  ?Navigator Location [facility]  ?Navigator Encounter Type Introductory Phone Call  ?Patient Visit Type MedOnc  ?Treatment Phase Pre-Tx/Tx Discussion  ?Barriers/Navigation Needs Coordination of Care;Education  ?Education Other  ?Interventions Coordination of Care;Education  ?Acuity Level 2-Minimal Needs (1-2 Barriers Identified)  ?Coordination of Care Appts  ?Education Method Verbal;Written  ?Support Groups/Services Friends and Family  ?Time Spent with Patient 45  ?  ?

## 2021-08-12 NOTE — Anesthesia Preprocedure Evaluation (Addendum)
Anesthesia Evaluation  ?Patient identified by MRN, date of birth, ID band ?Patient awake ? ? ? ?Reviewed: ?Allergy & Precautions, NPO status , Patient's Chart, lab work & pertinent test results ? ?Airway ?Mallampati: III ? ?TM Distance: >3 FB ?Neck ROM: Full ? ? ? Dental ? ?(+) Edentulous Upper, Missing, Dental Advisory Given,  ?  ?Pulmonary ?neg pulmonary ROS, former smoker,  ?  ?Pulmonary exam normal ?breath sounds clear to auscultation ? ? ? ? ? ? Cardiovascular ?hypertension, Pt. on medications ?+ CABG (1970s) and + Peripheral Vascular Disease (SVC syndrome)  ?Normal cardiovascular exam ?Rhythm:Regular Rate:Normal ? ? ?  ?Neuro/Psych ? Headaches, PSYCHIATRIC DISORDERS Anxiety   ? GI/Hepatic ?Neg liver ROS, GERD  Medicated,  ?Endo/Other  ?negative endocrine ROS ? Renal/GU ?negative Renal ROS  ?negative genitourinary ?  ?Musculoskeletal ? ?(+) Arthritis ,  ? Abdominal ?  ?Peds ? Hematology ?negative hematology ROS ?(+)   ?Anesthesia Other Findings ? ? Reproductive/Obstetrics ? ?  ? ? ? ? ? ? ? ? ? ? ? ? ? ?  ?  ? ? ? ? ? ? ? ?Anesthesia Physical ?Anesthesia Plan ? ?ASA: 3 ? ?Anesthesia Plan: MAC  ? ?Post-op Pain Management:   ? ?Induction: Intravenous ? ?PONV Risk Score and Plan: Propofol infusion and Treatment may vary due to age or medical condition ? ?Airway Management Planned: Natural Airway ? ?Additional Equipment:  ? ?Intra-op Plan:  ? ?Post-operative Plan:  ? ?Informed Consent: I have reviewed the patients History and Physical, chart, labs and discussed the procedure including the risks, benefits and alternatives for the proposed anesthesia with the patient or authorized representative who has indicated his/her understanding and acceptance.  ? ? ? ?Dental advisory given ? ?Plan Discussed with: CRNA ? ?Anesthesia Plan Comments:   ? ? ? ? ? ?Anesthesia Quick Evaluation ? ?

## 2021-08-12 NOTE — Telephone Encounter (Signed)
Patient's daughter Selinda Eon stated that Dr. [name] asked them to find a surgeon for [place] to refer patient to for his stomach cancer.  They have chosen Dr. Jyl Heinz at [facility].  His phone number is [phone] and his fax humber is [phone].  They need this referral expedited asap.  Please call patient or his daughter to let them know once the referral has been submitted so they can go ahead and make the appointment.  ? ?Referral sent to Dr. Jyl Heinz at [facility]. Fax # [phone] ?Selinda Eon made aware ?Selinda Eon verbalized understanding with all questions answered.  ? ?

## 2021-08-13 ENCOUNTER — Ambulatory Visit (HOSPITAL_COMMUNITY)
Admission: RE | Admit: 2021-08-13 | Discharge: 2021-08-13 | Disposition: A | Discharge status: Home / Self Care | Payer: Medicare HMO | Attending: Gastroenterology | Admitting: Gastroenterology

## 2021-08-13 ENCOUNTER — Encounter: Payer: Self-pay | Admitting: *Deleted

## 2021-08-13 ENCOUNTER — Ambulatory Visit (HOSPITAL_COMMUNITY): Payer: Medicare HMO | Admitting: Anesthesiology

## 2021-08-13 ENCOUNTER — Encounter (HOSPITAL_COMMUNITY): Payer: Self-pay | Admitting: Gastroenterology

## 2021-08-13 ENCOUNTER — Ambulatory Visit (HOSPITAL_BASED_OUTPATIENT_CLINIC_OR_DEPARTMENT_OTHER): Payer: Medicare HMO | Admitting: Anesthesiology

## 2021-08-13 ENCOUNTER — Other Ambulatory Visit: Payer: Self-pay

## 2021-08-13 ENCOUNTER — Encounter (HOSPITAL_COMMUNITY)
Admission: RE | Disposition: A | Discharge status: Home / Self Care | Payer: Self-pay | Source: Home / Self Care | Attending: Gastroenterology

## 2021-08-13 DIAGNOSIS — C169 Malignant neoplasm of stomach, unspecified: Secondary | ICD-10-CM

## 2021-08-13 DIAGNOSIS — K219 Gastro-esophageal reflux disease without esophagitis: Secondary | ICD-10-CM | POA: Diagnosis not present

## 2021-08-13 DIAGNOSIS — C160 Malignant neoplasm of cardia: Secondary | ICD-10-CM | POA: Insufficient documentation

## 2021-08-13 DIAGNOSIS — K2100 Gastro-esophageal reflux disease with esophagitis, without bleeding: Secondary | ICD-10-CM | POA: Diagnosis not present

## 2021-08-13 DIAGNOSIS — I8500 Esophageal varices without bleeding: Secondary | ICD-10-CM | POA: Diagnosis not present

## 2021-08-13 DIAGNOSIS — Z951 Presence of aortocoronary bypass graft: Secondary | ICD-10-CM | POA: Diagnosis not present

## 2021-08-13 DIAGNOSIS — I871 Compression of vein: Secondary | ICD-10-CM

## 2021-08-13 DIAGNOSIS — I8510 Secondary esophageal varices without bleeding: Secondary | ICD-10-CM

## 2021-08-13 DIAGNOSIS — F419 Anxiety disorder, unspecified: Secondary | ICD-10-CM | POA: Insufficient documentation

## 2021-08-13 DIAGNOSIS — Z87891 Personal history of nicotine dependence: Secondary | ICD-10-CM | POA: Insufficient documentation

## 2021-08-13 DIAGNOSIS — K2970 Gastritis, unspecified, without bleeding: Secondary | ICD-10-CM | POA: Diagnosis not present

## 2021-08-13 DIAGNOSIS — I10 Essential (primary) hypertension: Secondary | ICD-10-CM | POA: Diagnosis not present

## 2021-08-13 DIAGNOSIS — K209 Esophagitis, unspecified without bleeding: Secondary | ICD-10-CM | POA: Diagnosis not present

## 2021-08-13 DIAGNOSIS — R590 Localized enlarged lymph nodes: Secondary | ICD-10-CM | POA: Diagnosis not present

## 2021-08-13 DIAGNOSIS — K2289 Other specified disease of esophagus: Secondary | ICD-10-CM | POA: Insufficient documentation

## 2021-08-13 DIAGNOSIS — K3189 Other diseases of stomach and duodenum: Secondary | ICD-10-CM | POA: Diagnosis not present

## 2021-08-13 HISTORY — PX: EUS: SHX5427

## 2021-08-13 HISTORY — PX: ESOPHAGOGASTRODUODENOSCOPY (EGD) WITH PROPOFOL: SHX5813

## 2021-08-13 HISTORY — PX: BIOPSY: SHX5522

## 2021-08-13 SURGERY — ESOPHAGOGASTRODUODENOSCOPY (EGD) WITH PROPOFOL
Anesthesia: Monitor Anesthesia Care

## 2021-08-13 MED ORDER — PROPOFOL 500 MG/50ML IV EMUL
INTRAVENOUS | Status: DC | PRN
  Administered 2021-08-13: 200 ug/kg/min via INTRAVENOUS

## 2021-08-13 MED ORDER — PROPOFOL 10 MG/ML IV BOLUS
INTRAVENOUS | Status: DC | PRN
  Administered 2021-08-13: 100 mg via INTRAVENOUS

## 2021-08-13 MED ORDER — LACTATED RINGERS IV SOLN: INTRAVENOUS | Status: DC | PRN

## 2021-08-13 MED ORDER — PHENYLEPHRINE 40 MCG/ML (10ML) SYRINGE FOR IV PUSH (FOR BLOOD PRESSURE SUPPORT)
PREFILLED_SYRINGE | INTRAVENOUS | Status: DC | PRN
  Administered 2021-08-13 (×4): 80 ug via INTRAVENOUS

## 2021-08-13 MED ORDER — LIDOCAINE 2% (20 MG/ML) 5 ML SYRINGE
INTRAMUSCULAR | Status: DC | PRN
Start: 2021-08-13 — End: 2021-08-13
  Administered 2021-08-13: 100 mg via INTRAVENOUS

## 2021-08-13 MED ORDER — SODIUM CHLORIDE 0.9 % IV SOLN: INTRAVENOUS | Status: DC

## 2021-08-13 MED ORDER — EPHEDRINE SULFATE-NACL 50-0.9 MG/10ML-% IV SOSY
PREFILLED_SYRINGE | INTRAVENOUS | Status: DC | PRN
  Administered 2021-08-13 (×2): 10 mg via INTRAVENOUS

## 2021-08-13 MED ORDER — OMEPRAZOLE 40 MG PO CPDR: 40.0000 mg | DELAYED_RELEASE_CAPSULE | Freq: Two times a day (BID) | ORAL | 12 refills | Status: DC

## 2021-08-13 SURGICAL SUPPLY — 15 items

## 2021-08-13 NOTE — H&P (Signed)
? ?GASTROENTEROLOGY PROCEDURE H&P NOTE  ? ?Primary Care Physician: ?Jani Gravel, MD ? ?HPI: ?George Dyer is a 81 y.o. male who presents for EGD/EUS to evaluate/stage gastric adenocarcinoma. ? ?Past Medical History:  ?Diagnosis Date  ? Anxiety   ? Arthritis   ? Constipation   ? ED (erectile dysfunction)   ? GERD (gastroesophageal reflux disease)   ? Headache   ? Hypertension   ? Kidney stone   ? Lumbar disc disease   ? Pinched nerve   ? Pneumonia   ? SVC syndrome   ? SECONDARY TO CHRONIC FIBROSING MEDIASTINITIS  ? ?Past Surgical History:  ?Procedure Laterality Date  ? BREAST BIOPSY    ? RIGHT BREAST  ? CARDIAC SURGERY  1970's  ? wife wasn't sure what type of heart surgery.  ? COLONOSCOPY    ? INGUINAL HERNIA REPAIR  07/10/2012  ? Procedure: LAPAROSCOPIC INGUINAL HERNIA;  Surgeon: Ralene Ok, MD;  Location: [place];  Service: General;  Laterality: Right;  ? INGUINAL HERNIA REPAIR  07/10/12  ? RIH  ? INSERTION OF MESH  07/10/2012  ? Procedure: INSERTION OF MESH;  Surgeon: Ralene Ok, MD;  Location: [place];  Service: General;  Laterality: Right;  ? LUMBAR [place]    ? ORIF HUMERUS FRACTURE Left 01/13/2016  ? Procedure: OPEN REDUCTION INTERNAL FIXATION (ORIF) LEFT PROXIMAL HUMERUS FRACTURE;  Surgeon: Roseanne Kaufman, MD;  Location: [place];  Service: Orthopedics;  Laterality: Left;  ? TONSILLECTOMY    ? US ECHOCARDIOGRAPHY  04/20/1993  ? EF 65%  ? ?Current Facility-Administered Medications  ?Medication Dose Route Frequency Provider Last Rate Last Admin  ? 0.9 %  sodium chloride infusion   Intravenous Continuous Milus Banister, MD      ? ? ?Current Facility-Administered Medications:  ?  0.9 %  sodium chloride infusion, , Intravenous, Continuous, Milus Banister, MD ?Allergies  ?Allergen Reactions  ? Percocet [Oxycodone-Acetaminophen] Nausea And Vomiting  ? Oxycodone-Aspirin Nausea And Vomiting  ? ?Family History  ?Problem Relation Age of Onset  ? Hypertension Mother   ? Heart attack Father   ? Heart disease  Father   ? Cancer Maternal Aunt   ?     breast  ? Cancer Maternal Uncle   ? Colon cancer Neg Hx   ? Stomach cancer Neg Hx   ? Rectal cancer Neg Hx   ? ?Social History  ? ?Socioeconomic History  ? Marital status: Married  ?  Spouse name: Not on file  ? Number of children: 3  ? Years of education: Not on file  ? Highest education level: Not on file  ?Occupational History  ? Occupation: self employed  ?Tobacco Use  ? Smoking status: Former  ?  Packs/day: 0.25  ?  Years: 6.00  ?  Pack years: 1.50  ?  Types: Cigarettes  ?  Quit date: 03/25/1981  ?  Years since quitting: 40.4  ? Smokeless tobacco: Never  ?Vaping Use  ? Vaping Use: Never used  ?Substance and Sexual Activity  ? Alcohol use: No  ? Drug use: No  ? Sexual activity: Not on file  ?Other Topics Concern  ? Not on file  ?Social History Narrative  ? Not on file  ? ?Social Determinants of Health  ? ?Financial Resource Strain: Not on file  ?Food Insecurity: Not on file  ?Transportation Needs: Not on file  ?Physical Activity: Not on file  ?Stress: Not on file  ?Social Connections: Not on file  ?Intimate Partner  Violence: Not on file  ? ? ?Physical Exam: ?Today's Vitals  ? 08/13/21 0906  ?BP: 127/67  ?Pulse: 76  ?Resp: 16  ?Temp: 97.8 ?F (36.6 ?C)  ?TempSrc: Temporal  ?SpO2: 99%  ?PainSc: 4   ? ?There is no height or weight on file to calculate BMI. ?GEN: NAD ?EYE: Sclerae anicteric ?ENT: MMM ?CV: Non-tachycardic ?GI: Soft, NT/ND ?NEURO:  Alert & Oriented x 3 ? ?Lab Results: ?No results for input(s): WBC, HGB, HCT, PLT in the last 72 hours. ?BMET ?No results for input(s): NA, K, CL, CO2, GLUCOSE, BUN, CREATININE, CALCIUM in the last 72 hours. ?LFT ?No results for input(s): PROT, ALBUMIN, AST, ALT, ALKPHOS, BILITOT, BILIDIR, IBILI in the last 72 hours. ?PT/INR ?No results for input(s): LABPROT, INR in the last 72 hours. ? ? ?Impression / Plan: ?This is a 81 y.o.male who presents for EGD/EUS to evaluate/stage gastric adenocarcinoma. ? ?The risks of an EUS including  intestinal perforation, bleeding, infection, aspiration, and medication effects were discussed as was the possibility it may not give a definitive diagnosis if a biopsy is performed.  When a biopsy of the pancreas is done as part of the EUS, there is an additional risk of pancreatitis at the rate of about 1-2%.  It was explained that procedure related pancreatitis is typically mild, although it can be severe and even life threatening, which is why we do not perform random pancreatic biopsies and only biopsy a lesion/area we feel is concerning enough to warrant the risk. ? ?The risks and benefits of endoscopic evaluation/treatment were discussed with the patient and/or family; these include but are not limited to the risk of perforation, infection, bleeding, missed lesions, lack of diagnosis, severe illness requiring hospitalization, as well as anesthesia and sedation related illnesses.  The patient's history has been reviewed, patient examined, no change in status, and deemed stable for procedure.  The patient and/or family is agreeable to proceed.  ? ? ?Justice Britain, MD ?[facility] ?Advanced Endoscopy ?Office # [phone] ? ?

## 2021-08-13 NOTE — Transfer of Care (Signed)
Immediate Anesthesia Transfer of Care Note ? ?Patient: George Dyer ? ?Procedure(s) Performed: ESOPHAGOGASTRODUODENOSCOPY (EGD) WITH PROPOFOL ?UPPER ENDOSCOPIC ULTRASOUND (EUS) RADIAL ?BIOPSY ? ?Patient Location: PACU ? ?Anesthesia Type:MAC ? ?Level of Consciousness: awake and drowsy ? ?Airway & Oxygen Therapy: Patient Spontanous Breathing ? ?Post-op Assessment: Report given to RN and Post -op Vital signs reviewed and stable ? ?Post vital signs: Reviewed and stable ? ?Last Vitals:  ?Vitals Value Taken Time  ?BP 90/58 08/13/21 1101  ?Temp    ?Pulse 84 08/13/21 1101  ?Resp 18 08/13/21 1101  ?SpO2 98 % 08/13/21 1101  ?Vitals shown include unvalidated device data. ? ?Last Pain:  ?Vitals:  ? 08/13/21 0906  ?TempSrc: Temporal  ?PainSc: 4   ?   ? ?  ? ?Complications: No notable events documented. ?

## 2021-08-13 NOTE — Progress Notes (Signed)
Patient's daughter, Sharyn Lull, calling to see if patient could be scheduled for the originally offered appointment on 3/10. Unfortunately this appointment has been filled, but I was able to get him moved up to Tuesday, 3/14.  ? ?Oncology Nurse Navigator Documentation ? ?Oncology Nurse Navigator Flowsheets 08/13/2021  ?Abnormal Finding Date -  ?Confirmed Diagnosis Date -  ?Diagnosis Status -  ?Navigator Follow Up Date: 08/18/2021  ?Navigator Follow Up Reason: New Patient Appointment  ?Navigator Location [facility]  ?Navigator Encounter Type Telephone  ?Telephone Appt Confirmation/Clarification;Incoming Call  ?Patient Visit Type MedOnc  ?Treatment Phase Pre-Tx/Tx Discussion  ?Barriers/Navigation Needs Coordination of Care;Education  ?Education Other  ?Interventions Coordination of Care;Education  ?Acuity Level 2-Minimal Needs (1-2 Barriers Identified)  ?Coordination of Care Appts  ?Education Method Verbal  ?Support Groups/Services Friends and Family  ?Time Spent with Patient 15  ?  ?

## 2021-08-13 NOTE — Op Note (Addendum)
[facility] Patient Name: George Dyer Procedure Date : 08/13/2021 MRN: [id number] Attending MD: Justice Britain , MD Date of Birth: Nov 04, 1940 CSN: [id number] Age: 81 Admit Type: Outpatient Procedure:                Upper EUS Indications:              Pre-treatment staging of gastric adenocarcinoma Providers:                Justice Britain, MD, Ervin Knack, RN, Despina Pole, Technician, Tyna Jaksch Technician Referring MD:             Jackquline Denmark, MD Medicines:                Monitored Anesthesia Care Complications:            No immediate complications. Estimated Blood Loss:     Estimated blood loss was minimal. Procedure:                Pre-Anesthesia Assessment:                           - Prior to the procedure, a History and Physical                            was performed, and patient medications and                            allergies were reviewed. The patient's tolerance of                            previous anesthesia was also reviewed. The risks                            and benefits of the procedure and the sedation                            options and risks were discussed with the patient.                            All questions were answered, and informed consent                            was obtained. Prior Anticoagulants: The patient has                            taken no previous anticoagulant or antiplatelet                            agents. ASA Grade Assessment: III - A patient with                            severe systemic disease. After reviewing the risks  and benefits, the patient was deemed in                            satisfactory condition to undergo the procedure.                           After obtaining informed consent, the endoscope was                            passed under direct vision. Throughout the                            procedure, the patient's  blood pressure, pulse, and                            oxygen saturations were monitored continuously. The                            [id number]-[id number] ([id number]) Olympus endoscope was introduced                            through the mouth, and advanced to the second part                            of duodenum. The [id number]-[id number] ([id number]) Olympus                            radial ultrasound scope was introduced through the                            mouth, and advanced to the duodenum for ultrasound                            examination from the esophagus, stomach and                            duodenum. After obtaining informed consent, the                            endoscope was passed under direct vision.                            Throughout the procedure, the patient's blood                            pressure, pulse, and oxygen saturations were                            monitored continuously.The upper EUS was                            accomplished without difficulty. The patient  tolerated the procedure. Scope In: Scope Out: Findings:      ENDOSCOPIC FINDING: :      A few patches of white nummular lesions were noted in the mid esophagus.       Biopsies were taken with a cold forceps for histology to rule out       Candida.      Very small grade I varices were found in the distal esophagus.      LA Grade A (one or more mucosal breaks less than 5 mm, not extending       between tops of 2 mucosal folds) esophagitis with no bleeding was found       at the gastroesophageal junction.      The Z-line was irregular and was found 40 cm from the incisors.      A large, fungating, infiltrative and ulcerated, partially       circumferential (involving one-third of the lumen circumference) mass       with oozing bleeding and stigmata of recent bleeding was found in the       cardia.      Patchy moderate inflammation characterized by erosions, erythema and       friability  was found in the entire examined stomach. Not biopsied again       as recently biopsied and HP negative.      No gross lesions were noted in the duodenal bulb, in the first portion       of the duodenum and in the second portion of the duodenum.      ENDOSONOGRAPHIC FINDING: :      A hypoechoic ulcerated mass was identified endosonographically in the       cardia of the stomach. The mass begins at 5 cm from the gastroesophageal       junction and measured 48 mm by 15 mm in maximal cross-sectional       diameter. The outer margins were irregular. There was sonographic       evidence suggesting invasion into and through the muscularis propria       (Layer 4) and into the serosa (Layer 5). An intact interface was seen       however between the mass and the adjacent structures suggesting a lack       of invasion through the serosa.      Endosonographic imaging in the body of the stomach, in the antrum of the       stomach and in the pylorus showed no wall thickening.      Endoscopic ultrasound examination disclosed a small single tubal,       anechoic structure in the gastroesophageal junction consistent with       esophageal varices.      Otherwise there was no sign of significant endosonographic abnormality       in the thoracic esophagus and in the gastroesophageal junction. No       masses and no wall thickening were identified.      Two enlarged lymph nodes were visualized in the lower paraesophageal       mediastinum (level 8L) with the ultrasound probe located from 38 cm to       40 cm. These were 5-7 cm from the primary tumor. The largest measured       6.8 mm by 6.8 mm and the other was 3.4 mm x 4.0 mm in maximal       cross-sectional diameter. The nodes were round,  hypoechoic and had well       defined margins.      No malignant-appearing lymph nodes were visualized in the paracardial       region (level 16), left gastric region (level 17), gastrohepatic       ligament (level 18)  and celiac region (level 20).      Endosonographic imaging in the visualized portion of the liver showed no       mass.      The celiac region was visualized. Impression:               EGD Impression:                           - White nummular lesions in mid-esophagus biopsied.                           - Small grade I esophageal varices distally.                           - LA Grade A esophagitis with no bleeding.                           - Z-line irregular, 40 cm from the incisors.                           - Malignant gastric tumor in the cardia.                           - Gastritis.                           - No gross lesions in the duodenal bulb, in the                            first portion of the duodenum and in the second                            portion of the duodenum.                           EUS Impression:                           - A mass was found in the cardia of the stomach. A                            tissue diagnosis was obtained prior to this exam.                            This is consistent with adenocarcinoma. This was                            staged T3 N1 Mx by endosonographic criteria (and  based on the lymph node visualization as noted).                           - Small varix was visualized endosonographically in                            the gastroesophageal junction.                           - There was no sign of significant pathology in the                            thoracic esophagus and in the gastroesophageal                            junction.                           - Two enlarged lymph nodes were visualized in the                            lower paraesophageal mediastinum (level 8L).                           - No malignant-appearing lymph nodes were                            visualized in the paracardial region (level 16),                            left gastric region (level 17), gastrohepatic                             ligament (level 18) and celiac region (level 20). Recommendation:           - The patient will be observed post-procedure,                            until all discharge criteria are met.                           - Discharge patient to home.                           - Patient has a contact number available for                            emergencies. The signs and symptoms of potential                            delayed complications were discussed with the                            patient. Return to normal activities tomorrow.  Written discharge instructions were provided to the                            patient.                           - Resume previous diet.                           - Observe patient's clinical course.                           - Await path results.                           - Referral to Oncology/Radiation Oncology/Surgery                            should already be in place (will discuss with Dr.                            Steve Rattler team). Suspect that Chemo-XRT may be                            required. May need updated imaging as well but                            defer this for now to their teams.                           - The findings and recommendations were discussed                            with the patient.                           - The findings and recommendations were discussed                            with the patient's family. Procedure Code(s):        --- Professional ---                           [phone], Esophagogastroduodenoscopy, flexible,                            transoral; with endoscopic ultrasound examination,                            including the esophagus, stomach, and either the                            duodenum or a surgically altered stomach where the                            jejunum is examined distal to the anastomosis  27035,  Esophagogastroduodenoscopy, flexible,                            transoral; with biopsy, single or multiple Diagnosis Code(s):        --- Professional ---                           K22.8, Other specified diseases of esophagus                           I85.00, Esophageal varices without bleeding                           K20.90, Esophagitis, unspecified without bleeding                           C16.0, Malignant neoplasm of cardia                           K29.70, Gastritis, unspecified, without bleeding                           K31.89, Other diseases of stomach and duodenum                           I89.9, Noninfective disorder of lymphatic vessels                            and lymph nodes, unspecified                           R59.0, Localized enlarged lymph nodes CPT copyright 2019 American Medical Association. All rights reserved. The codes documented in this report are preliminary and upon coder review may  be revised to meet current compliance requirements. Justice Britain, MD 08/13/2021 11:11:58 AM Number of Addenda: 0

## 2021-08-14 ENCOUNTER — Encounter (HOSPITAL_COMMUNITY): Payer: Self-pay | Admitting: Gastroenterology

## 2021-08-14 ENCOUNTER — Encounter: Payer: Self-pay | Admitting: Gastroenterology

## 2021-08-14 LAB — SURGICAL PATHOLOGY

## 2021-08-14 NOTE — Anesthesia Postprocedure Evaluation (Signed)
Anesthesia Post Note ? ?Patient: COMPTON BRIGANCE ? ?Procedure(s) Performed: ESOPHAGOGASTRODUODENOSCOPY (EGD) WITH PROPOFOL ?UPPER ENDOSCOPIC ULTRASOUND (EUS) RADIAL ?BIOPSY ? ?  ? ?Patient location during evaluation: PACU ?Anesthesia Type: MAC ?Level of consciousness: awake and alert ?Pain management: pain level controlled ?Vital Signs Assessment: post-procedure vital signs reviewed and stable ?Respiratory status: spontaneous breathing, nonlabored ventilation, respiratory function stable and patient connected to nasal cannula oxygen ?Cardiovascular status: stable and blood pressure returned to baseline ?Postop Assessment: no apparent nausea or vomiting ?Anesthetic complications: no ? ? ?No notable events documented. ? ?Last Vitals:  ?Vitals:  ? 08/13/21 1116 08/13/21 1131  ?BP: 94/66 106/60  ?Pulse: 83 71  ?Resp: 15 11  ?Temp:  36.5 ?C  ?SpO2: 99% 100%  ?  ?Last Pain:  ?Vitals:  ? 08/13/21 1131  ?TempSrc:   ?PainSc: 0-No pain  ? ? ?  ?  ?  ?  ?  ?  ? ?Sangeeta Youse L Jeet Shough ? ? ? ? ?

## 2021-08-18 ENCOUNTER — Telehealth: Payer: Self-pay | Admitting: Gastroenterology

## 2021-08-18 ENCOUNTER — Inpatient Hospital Stay: Payer: Medicare HMO | Admitting: Hematology & Oncology

## 2021-08-18 ENCOUNTER — Encounter: Payer: Self-pay | Admitting: Hematology & Oncology

## 2021-08-18 ENCOUNTER — Encounter: Payer: Self-pay | Admitting: *Deleted

## 2021-08-18 ENCOUNTER — Other Ambulatory Visit: Payer: Self-pay

## 2021-08-18 ENCOUNTER — Inpatient Hospital Stay: Payer: Medicare HMO | Attending: Hematology & Oncology

## 2021-08-18 VITALS — BP 128/68 | HR 77 | Temp 97.8°F | Resp 18 | Ht 69.0 in | Wt 150.0 lb

## 2021-08-18 DIAGNOSIS — Z86718 Personal history of other venous thrombosis and embolism: Secondary | ICD-10-CM | POA: Insufficient documentation

## 2021-08-18 DIAGNOSIS — Z79899 Other long term (current) drug therapy: Secondary | ICD-10-CM

## 2021-08-18 DIAGNOSIS — C162 Malignant neoplasm of body of stomach: Secondary | ICD-10-CM | POA: Diagnosis not present

## 2021-08-18 DIAGNOSIS — Z87891 Personal history of nicotine dependence: Secondary | ICD-10-CM

## 2021-08-18 DIAGNOSIS — Z7189 Other specified counseling: Secondary | ICD-10-CM

## 2021-08-18 DIAGNOSIS — I10 Essential (primary) hypertension: Secondary | ICD-10-CM | POA: Diagnosis not present

## 2021-08-18 DIAGNOSIS — C160 Malignant neoplasm of cardia: Secondary | ICD-10-CM

## 2021-08-18 HISTORY — DX: Malignant neoplasm of cardia: C16.0

## 2021-08-18 HISTORY — DX: Other specified counseling: Z71.89

## 2021-08-18 LAB — IRON AND IRON BINDING CAPACITY (CC-WL,HP ONLY)
Iron: 90 ug/dL (ref 45–182)
Saturation Ratios: 26 % (ref 17.9–39.5)
TIBC: 343 ug/dL (ref 250–450)
UIBC: 253 ug/dL (ref 117–376)

## 2021-08-18 LAB — CBC WITH DIFFERENTIAL (CANCER CENTER ONLY)
Abs Immature Granulocytes: 0.01 10*3/uL (ref 0.00–0.07)
Basophils Absolute: 0.1 10*3/uL (ref 0.0–0.1)
Basophils Relative: 1 %
Eosinophils Absolute: 0.2 10*3/uL (ref 0.0–0.5)
Eosinophils Relative: 3 %
HCT: 39 % (ref 39.0–52.0)
Hemoglobin: 12.3 g/dL — ABNORMAL LOW (ref 13.0–17.0)
Immature Granulocytes: 0 %
Lymphocytes Relative: 28 %
Lymphs Abs: 1.7 10*3/uL (ref 0.7–4.0)
MCH: 27.8 pg (ref 26.0–34.0)
MCHC: 31.5 g/dL (ref 30.0–36.0)
MCV: 88 fL (ref 80.0–100.0)
Monocytes Absolute: 0.6 10*3/uL (ref 0.1–1.0)
Monocytes Relative: 10 %
Neutro Abs: 3.6 10*3/uL (ref 1.7–7.7)
Neutrophils Relative %: 58 %
Platelet Count: 465 10*3/uL — ABNORMAL HIGH (ref 150–400)
RBC: 4.43 MIL/uL (ref 4.22–5.81)
RDW: 12.8 % (ref 11.5–15.5)
WBC Count: 6.1 10*3/uL (ref 4.0–10.5)
nRBC: 0 % (ref 0.0–0.2)

## 2021-08-18 LAB — RETICULOCYTES
Immature Retic Fract: 6.1 % (ref 2.3–15.9)
RBC.: 4.36 MIL/uL (ref 4.22–5.81)
Retic Count, Absolute: 31.8 10*3/uL (ref 19.0–186.0)
Retic Ct Pct: 0.7 % (ref 0.4–3.1)

## 2021-08-18 LAB — CMP (CANCER CENTER ONLY)
ALT: 9 U/L (ref 0–44)
AST: 10 U/L — ABNORMAL LOW (ref 15–41)
Albumin: 4.3 g/dL (ref 3.5–5.0)
Alkaline Phosphatase: 75 U/L (ref 38–126)
Anion gap: 7 (ref 5–15)
BUN: 16 mg/dL (ref 8–23)
CO2: 30 mmol/L (ref 22–32)
Calcium: 10.2 mg/dL (ref 8.9–10.3)
Chloride: 100 mmol/L (ref 98–111)
Creatinine: 1.18 mg/dL (ref 0.61–1.24)
GFR, Estimated: >60 mL/min (ref >60)
Glucose, Bld: 102 mg/dL — ABNORMAL HIGH (ref 70–99)
Potassium: 4.5 mmol/L (ref 3.5–5.1)
Sodium: 137 mmol/L (ref 135–145)
Total Bilirubin: 0.4 mg/dL (ref 0.3–1.2)
Total Protein: 6.7 g/dL (ref 6.5–8.1)

## 2021-08-18 NOTE — Telephone Encounter (Signed)
I called and spoke with Dr. Marin Olp today. ?Thanks. ?GM ?

## 2021-08-18 NOTE — Telephone Encounter (Signed)
Hey Dr. Rush Landmark,  ? ?Dr. Burney Gauze at [facility] would like a call back to discuss patient care. Contact number [phone] ? ?Thank you ?

## 2021-08-18 NOTE — Progress Notes (Signed)
Referral MD ? 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 ?Chief Complaint  ?Patient presents with  ? New Patient (Initial Visit)  ?: I have stomach cancer. ? ?HPI: George Dyer is a really nice 81 year old white male.  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  He has been in great health.  He really has had very few illnesses.  However, what is incredibly interesting is that he had SVC syndrome back 40 years ago.  He had a thoracotomy.  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  He has marked collateral vessels that are formed. ? ?About 6 months ago, he started to have some abdominal pain.  He says he lost about 15 pounds.  He has had no melena or bright red blood per rectum.  He has had no nausea or vomiting.  He says he is eating okay.  His wife, who is with him, said he may be eating less. ? ?He did have a CT scan that was done.  This showed the obstructed SVC.  He has a collateral vessels.  He had no obvious adenopathy.  There is a normal liver and spleen.  His stomach looked okay.  He had good contrast all the way down to his rectum. ? ?He then underwent an upper endoscopy.  This was done by Dr. [name].  This was done on 08/06/2021.  There was found in the body of the stomach a mass measuring 3 x 3 cm.  This was biopsied.  The pathology report ([id number]) showed an invasive moderately differentiated adenocarcinoma. ? ?He then underwent an endoscopic ultrasound.  This was done by Dr. Rush Landmark on 08/13/2021.  This showed a 48 x 15 mm mass in the cardia of the stomach.  This invaded through the wall of the stomach into the serosa but not through the serosa.  There were no malignant appearing perigastric lymph nodes.  However, in the periesophageal mediastinum, there were 2 lymph nodes that were slightly enlarged.  However they were well formed.  1 measures 6.8 x 6.8 mm and the other measured 4.3 x 1.5 mm.  These were not biopsied. ? ?He subsequently was referred to the  [place] for an evaluation. ? ?There is no obvious history of cancer in the family.  He does not smoke.  He used to drink without was a long time ago. ? ?He has had no problems with diabetes.  He has had no issues with COVID. ? ?Actually, he is in very good shape. ? ?Overall, I would say his performance status is probably ECOG 0-1. ? ? ? ?Past Medical History:  ?Diagnosis Date  ? Anxiety   ? Arthritis   ? Constipation   ? ED (erectile dysfunction)   ? GERD (gastroesophageal reflux disease)   ? Headache   ? Hypertension   ? Kidney stone   ? Lumbar disc disease   ? Pinched nerve   ? Pneumonia   ? SVC syndrome   ? SECONDARY TO CHRONIC FIBROSING MEDIASTINITIS  ?: ? ? ?Past Surgical History:  ?Procedure Laterality Date  ? BIOPSY  08/13/2021  ? Procedure: BIOPSY;  Surgeon: Irving Copas., MD;  Location: [place];  Service: Gastroenterology;;  ? BREAST BIOPSY    ? RIGHT BREAST  ? CARDIAC SURGERY  1970's  ? wife wasn't sure what type of heart surgery.  ? COLONOSCOPY    ? ESOPHAGOGASTRODUODENOSCOPY (EGD) WITH PROPOFOL N/A 08/13/2021  ? Procedure: ESOPHAGOGASTRODUODENOSCOPY (  EGD) WITH PROPOFOL;  Surgeon: Mansouraty, Telford Nab., MD;  Location: [place];  Service: Gastroenterology;  Laterality: N/A;  ? EUS N/A 08/13/2021  ? Procedure: UPPER ENDOSCOPIC ULTRASOUND (EUS) RADIAL;  Surgeon: Rush Landmark Telford Nab., MD;  Location: [place];  Service: Gastroenterology;  Laterality: N/A;  ? INGUINAL HERNIA REPAIR  07/10/2012  ? Procedure: LAPAROSCOPIC INGUINAL HERNIA;  Surgeon: Ralene Ok, MD;  Location: [place];  Service: General;  Laterality: Right;  ? INGUINAL HERNIA REPAIR  07/10/12  ? RIH  ? INSERTION OF MESH  07/10/2012  ? Procedure: INSERTION OF MESH;  Surgeon: Ralene Ok, MD;  Location: [place];  Service: General;  Laterality: Right;  ? LUMBAR [place]    ? ORIF HUMERUS FRACTURE Left 01/13/2016  ? Procedure: OPEN REDUCTION INTERNAL FIXATION (ORIF) LEFT PROXIMAL HUMERUS FRACTURE;  Surgeon:  Roseanne Kaufman, MD;  Location: [place];  Service: Orthopedics;  Laterality: Left;  ? TONSILLECTOMY    ? US ECHOCARDIOGRAPHY  04/20/1993  ? EF 65%  ?: ? ? ?Current Outpatient Medications:  ?  acetaminophen (TYLENOL) 650 MG CR tablet, Take 1,300 mg by mouth 2 (two) times daily., Disp: , Rfl:  ?  alum & mag hydroxide-simeth (MAALOX/MYLANTA) 200-200-20 MG/5ML suspension, Take 30 mLs by mouth daily., Disp: , Rfl:  ?  lisinopril-hydrochlorothiazide (ZESTORETIC) 20-12.5 MG tablet, Take 1 tablet by mouth daily., Disp: 90 tablet, Rfl: 3 ?  omeprazole (PRILOSEC) 40 MG capsule, Take 1 capsule (40 mg total) by mouth 2 (two) times daily before a meal., Disp: 60 capsule, Rfl: 12 ?  polyethylene glycol (MIRALAX / GLYCOLAX) 17 g packet, Take 17 g by mouth daily., Disp: , Rfl: : ? ?: ? ? ?Allergies  ?Allergen Reactions  ? Percocet [Oxycodone-Acetaminophen] Nausea And Vomiting  ? Oxycodone-Aspirin Nausea And Vomiting  ?: ? ? ?Family History  ?Problem Relation Age of Onset  ? Hypertension Mother   ? Heart attack Father   ? Heart disease Father   ? Cancer Maternal Aunt   ?     breast  ? Cancer Maternal Uncle   ? Colon cancer Neg Hx   ? Stomach cancer Neg Hx   ? Rectal cancer Neg Hx   ?: ? ? ?Social History  ? ?Socioeconomic History  ? Marital status: Married  ?  Spouse name: Not on file  ? Number of children: 3  ? Years of education: Not on file  ? Highest education level: Not on file  ?Occupational History  ? Occupation: self employed  ?Tobacco Use  ? Smoking status: Former  ?  Packs/day: 0.25  ?  Years: 6.00  ?  Pack years: 1.50  ?  Types: Cigarettes  ?  Quit date: 03/25/1981  ?  Years since quitting: 40.4  ? Smokeless tobacco: Never  ?Vaping Use  ? Vaping Use: Never used  ?Substance and Sexual Activity  ? Alcohol use: No  ? Drug use: No  ? Sexual activity: Not on file  ?Other Topics Concern  ? Not on file  ?Social History Narrative  ? Not on file  ? ?Social Determinants of Health  ? ?Financial Resource Strain: Not on file  ?Food  Insecurity: Not on file  ?Transportation Needs: Not on file  ?Physical Activity: Not on file  ?Stress: Not on file  ?Social Connections: Not on file  ?Intimate Partner Violence: Not on file  ?: ? ?Review of Systems  ?Constitutional:  Positive for weight loss. Negative for chills.  ?HENT: Negative.    ?Eyes: Negative.   ?  Respiratory: Negative.    ?Cardiovascular: Negative.   ?Gastrointestinal:  Positive for abdominal pain.  ?Genitourinary: Negative.   ?Musculoskeletal: Negative.   ?Skin: Negative.   ?Neurological: Negative.   ?Endo/Heme/Allergies: Negative.   ?Psychiatric/Behavioral: Negative.    ? ? ?Exam: ?'@IPVITALS'$ @ ?Physical Exam ?Vitals reviewed.  ?HENT:  ?   Head: Normocephalic and atraumatic.  ?Eyes:  ?   Pupils: Pupils are equal, round, and reactive to light.  ?Cardiovascular:  ?   Rate and Rhythm: Normal rate and regular rhythm.  ?   Heart sounds: Normal heart sounds.  ?Pulmonary:  ?   Effort: Pulmonary effort is normal.  ?   Breath sounds: Normal breath sounds.  ?Abdominal:  ?   General: Bowel sounds are normal.  ?   Palpations: Abdomen is soft.  ?Musculoskeletal:     ?   General: No tenderness or deformity. Normal range of motion.  ?   Cervical back: Normal range of motion.  ?Lymphadenopathy:  ?   Cervical: No cervical adenopathy.  ?Skin: ?   General: Skin is warm and dry.  ?   Findings: No erythema or rash.  ?Neurological:  ?   Mental Status: He is alert and oriented to person, place, and time.  ?Psychiatric:     ?   Behavior: Behavior normal.     ?   Thought Content: Thought content normal.     ?   Judgment: Judgment normal.  ? ? ? ? ?Recent Labs  ?  08/18/21 ?1034  ?WBC 6.1  ?HGB 12.3*  ?HCT 39.0  ?PLT 465*  ? ? ?Recent Labs  ?  08/18/21 ?1034  ?NA 137  ?K 4.5  ?CL 100  ?CO2 30  ?GLUCOSE 102*  ?BUN 16  ?CREATININE 1.18  ?CALCIUM 10.2  ? ? ?Blood smear review: none ? ?Pathology: See above ? ? ? ?Assessment and Plan: George Dyer is a very nice 81 year old white male.  He has an adenocarcinoma of the  cardia of the stomach. ? ?This is at least a stage II.  It is a T3 by endoscopic ultrasound. ? ?I think the question is whether or not these paraesophageal lymph nodes are anything malignant.  When I spoke w

## 2021-08-18 NOTE — Progress Notes (Signed)
Initial RN Navigator Patient Visit ? ?Name: George Dyer ?Date of Referral : 08/11/2021 ?Diagnosis: Gastric Cancer ? ?Met with patient prior to their visit with MD. Hanley Seamen patient "Your Patient Navigator" handout which explains my role, areas in which I am able to help, and all the contact information for myself and the office. Also gave patient MD and Navigator business card. Reviewed with patient the general overview of expected course after initial diagnosis and time frame for all steps to be completed. ? ?New patient packet given to patient which includes: orientation to office and staff; campus directory; education on My Chart and Advance Directives; and patient centered education on gastric cancer.  ? ?Patient completed visit with Dr.Ennever. ? ?Appointment with Dr Crisoforo Oxford already scheduled for 09/15/2021. He will need a PET. Currently pending, will schedule once auth obtained.  ? ?Patient understands all follow up procedures and expectations. They have my number to reach out for any further clarification or additional needs.  ? ?Oncology Nurse Navigator Documentation ? ?Oncology Nurse Navigator Flowsheets 08/18/2021  ?Abnormal Finding Date -  ?Confirmed Diagnosis Date -  ?Diagnosis Status -  ?Navigator Follow Up Date: 08/19/2021  ?Navigator Follow Up Reason: Radiology  ?Navigator Location [facility]  ?Referral Date to RadOnc/MedOnc 08/11/2021  ?Navigator Encounter Type Initial MedOnc  ?Telephone -  ?Patient Visit Type MedOnc  ?Treatment Phase Pre-Tx/Tx Discussion  ?Barriers/Navigation Needs Coordination of Care;Education  ?Education Newly Diagnosed Cancer Education;Pain/ Symptom Management;Other  ?Interventions Education;Psycho-Social Support  ?Acuity Level 2-Minimal Needs (1-2 Barriers Identified)  ?Coordination of Care -  ?Education Method Verbal;Written  ?Support Groups/Services Friends and Family  ?Time Spent with Patient 30  ?  ?

## 2021-08-19 ENCOUNTER — Encounter: Payer: Self-pay | Admitting: *Deleted

## 2021-08-19 LAB — FERRITIN: Ferritin: 52 ng/mL (ref 24–336)

## 2021-08-19 LAB — CEA (IN HOUSE-CHCC): CEA (CHCC-In House): 1.53 ng/mL (ref 0.00–5.00)

## 2021-08-19 NOTE — Progress Notes (Signed)
PET approved and scheduled. Able to get scheduled for tomorrow.  ? ?Called and spoke to patient's daughter, Sharyn Lull. Reviewed PET appointment with her including date, time and location. She is aware of arrival time, only water that am, and to have patient have low carb dinner.  ? ?Also reviewed with her that Dr Marin Olp had spoken to Dr Crisoforo Oxford and her father's appointment has been moved up to 08/24/2021. We don't have a time. I instructed her to call Dr Lyda Jester office tomorrow if someone doesn't reach out today with an updated time.  ? ?Oncology Nurse Navigator Documentation ? ?Oncology Nurse Navigator Flowsheets 08/19/2021  ?Abnormal Finding Date -  ?Confirmed Diagnosis Date -  ?Diagnosis Status -  ?Navigator Follow Up Date: 08/20/2021  ?Navigator Follow Up Reason: Scan Review  ?Navigator Location [facility]  ?Referral Date to RadOnc/MedOnc -  ?Navigator Encounter Type Appt/Treatment Plan Review;Telephone  ?Telephone Appt Confirmation/Clarification;Education;Outgoing Call  ?Patient Visit Type MedOnc  ?Treatment Phase Pre-Tx/Tx Discussion  ?Barriers/Navigation Needs Coordination of Care;Education  ?Education Other  ?Interventions Coordination of Care;Education;Psycho-Social Support  ?Acuity Level 2-Minimal Needs (1-2 Barriers Identified)  ?Coordination of Care Radiology  ?Education Method Verbal;Teach-back  ?Support Groups/Services Friends and Family  ?Time Spent with Patient 30  ?  ?

## 2021-08-20 ENCOUNTER — Inpatient Hospital Stay: Payer: Medicare HMO | Admitting: Hematology & Oncology

## 2021-08-20 ENCOUNTER — Ambulatory Visit (HOSPITAL_COMMUNITY)
Admission: RE | Admit: 2021-08-20 | Discharge: 2021-08-20 | Disposition: A | Discharge status: Home / Self Care | Payer: Medicare HMO | Source: Ambulatory Visit | Attending: Hematology & Oncology | Admitting: Hematology & Oncology

## 2021-08-20 ENCOUNTER — Other Ambulatory Visit: Payer: Self-pay

## 2021-08-20 ENCOUNTER — Inpatient Hospital Stay: Payer: Medicare HMO

## 2021-08-20 ENCOUNTER — Encounter: Payer: Self-pay | Admitting: *Deleted

## 2021-08-20 DIAGNOSIS — C162 Malignant neoplasm of body of stomach: Secondary | ICD-10-CM | POA: Insufficient documentation

## 2021-08-20 DIAGNOSIS — C169 Malignant neoplasm of stomach, unspecified: Secondary | ICD-10-CM | POA: Diagnosis not present

## 2021-08-20 DIAGNOSIS — I700 Atherosclerosis of aorta: Secondary | ICD-10-CM | POA: Insufficient documentation

## 2021-08-20 LAB — GLUCOSE, CAPILLARY: Glucose-Capillary: 102 mg/dL — ABNORMAL HIGH (ref 70–99)

## 2021-08-20 MED ORDER — FLUDEOXYGLUCOSE F - 18 (FDG) INJECTION
7.5000 | Freq: Once | INTRAVENOUS | Status: AC
  Administered 2021-08-20: 7.4 via INTRAVENOUS

## 2021-08-20 NOTE — Progress Notes (Signed)
Oncology Nurse Navigator Documentation ? ?Oncology Nurse Navigator Flowsheets 08/20/2021  ?Abnormal Finding Date -  ?Confirmed Diagnosis Date -  ?Diagnosis Status -  ?Navigator Follow Up Date: 08/25/2021  ?Navigator Follow Up Reason: Review Note  ?Navigator Location [facility]  ?Referral Date to RadOnc/MedOnc -  ?Navigator Encounter Type Scan Review  ?Telephone -  ?Patient Visit Type MedOnc  ?Treatment Phase Pre-Tx/Tx Discussion  ?Barriers/Navigation Needs Coordination of Care;Education  ?Education -  ?Interventions None Required  ?Acuity Level 2-Minimal Needs (1-2 Barriers Identified)  ?Coordination of Care -  ?Education Method -  ?Support Groups/Services Friends and Family  ?Time Spent with Patient 15  ?  ?

## 2021-08-21 ENCOUNTER — Encounter: Payer: Self-pay | Admitting: *Deleted

## 2021-08-21 ENCOUNTER — Other Ambulatory Visit: Payer: Self-pay

## 2021-08-21 DIAGNOSIS — I714 Abdominal aortic aneurysm, without rupture, unspecified: Secondary | ICD-10-CM

## 2021-08-21 DIAGNOSIS — I712 Thoracic aortic aneurysm, without rupture, unspecified: Secondary | ICD-10-CM

## 2021-08-21 DIAGNOSIS — Z01812 Encounter for preprocedural laboratory examination: Secondary | ICD-10-CM

## 2021-08-21 NOTE — Progress Notes (Signed)
Spoke to patient's wife.Dr.Jordan's advice given.He will have a chest ct in 1 year.Order placed.Advised to have done 1 week before follow up appointment with Dr.Jordan. ?

## 2021-08-21 NOTE — Progress Notes (Signed)
Volanda Napoleon, MD  P Onc Nurse Hp ?Please call and let him know that the PET scan does not show any obvious metastatic disease.  Everything looks to be confined in the stomach.  There is no obvious lymph nodes in the area around the esophagus.  There are no active lymph nodes around the stomach.  This is fantastic news.  ? ?Called and spoke to patient's daughter, Sharyn Lull. Reviewed results with her. She asked about the patient's incidental finding regarding the AAA. Sent the report to the patient's cardiologist ? ?Oncology Nurse Navigator Documentation ? ?Oncology Nurse Navigator Flowsheets 08/21/2021  ?Abnormal Finding Date -  ?Confirmed Diagnosis Date -  ?Diagnosis Status -  ?Navigator Follow Up Date: 08/25/2021  ?Navigator Follow Up Reason: Review Note  ?Navigator Location [facility]  ?Referral Date to RadOnc/MedOnc -  ?Navigator Encounter Type Telephone  ?Telephone Diagnostic Results;Education;Outgoing Call  ?Patient Visit Type MedOnc  ?Treatment Phase Pre-Tx/Tx Discussion  ?Barriers/Navigation Needs Coordination of Care;Education  ?Education Other  ?Interventions Education;Psycho-Social Support  ?Acuity Level 2-Minimal Needs (1-2 Barriers Identified)  ?Coordination of Care -  ?Education Method Verbal  ?Support Groups/Services Friends and Family  ?Time Spent with Patient 15  ?  ?

## 2021-08-21 NOTE — Progress Notes (Signed)
AAA is 4 cm so nothing needs to be done about this currently. Will just need serial follow up with either a CT or [place] in one year. If he is being treated for gastric cancer he will likely have CTs done regularly. ? ?Mylei Brackeen [place] MD, [facility] ? ?

## 2021-08-24 DIAGNOSIS — G893 Neoplasm related pain (acute) (chronic): Secondary | ICD-10-CM | POA: Diagnosis not present

## 2021-08-24 DIAGNOSIS — C169 Malignant neoplasm of stomach, unspecified: Secondary | ICD-10-CM | POA: Diagnosis not present

## 2021-08-24 DIAGNOSIS — R739 Hyperglycemia, unspecified: Secondary | ICD-10-CM | POA: Diagnosis not present

## 2021-08-24 DIAGNOSIS — I10 Essential (primary) hypertension: Secondary | ICD-10-CM | POA: Insufficient documentation

## 2021-08-24 DIAGNOSIS — Z79899 Other long term (current) drug therapy: Secondary | ICD-10-CM | POA: Diagnosis not present

## 2021-08-25 ENCOUNTER — Encounter: Payer: Self-pay | Admitting: *Deleted

## 2021-08-25 NOTE — Progress Notes (Signed)
Patient was seen by Dr Crisoforo Oxford at [facility]. The plan at this time is as follows: ? ?Surgery Date - 09/03/2021 ?Location - [facility] OUTPATIENT OR ?Procedure - DIAGNOSTIC LAPAROSCOPY  ?PORT-A-CATH INSERTION  ?No HPV needed ?Pre-Operative Assessment- 3/ 23 @ 8:00  ?Case Completed, marked for ready to schedule ?Noted to Calendar ?Patients Wife notified  ? ?Notified Dr Marin Olp. Will continue to follow for post surgical care here in the office and treatment.  ? ?Oncology Nurse Navigator Documentation ? ?Oncology Nurse Navigator Flowsheets 08/25/2021  ?Abnormal Finding Date -  ?Confirmed Diagnosis Date -  ?Diagnosis Status -  ?Navigator Follow Up Date: 09/03/2021  ?Navigator Follow Up Reason: Surgery  ?Navigator Location [facility]  ?Referral Date to RadOnc/MedOnc -  ?Navigator Encounter Type Appt/Treatment Plan Review  ?Telephone -  ?Patient Visit Type MedOnc  ?Treatment Phase Pre-Tx/Tx Discussion  ?Barriers/Navigation Needs Coordination of Care;Education  ?Education -  ?Interventions None Required  ?Acuity Level 2-Minimal Needs (1-2 Barriers Identified)  ?Coordination of Care -  ?Education Method -  ?Support Groups/Services Friends and Family  ?Time Spent with Patient 15  ?  ?

## 2021-08-26 DIAGNOSIS — I871 Compression of vein: Secondary | ICD-10-CM | POA: Diagnosis not present

## 2021-08-26 DIAGNOSIS — I10 Essential (primary) hypertension: Secondary | ICD-10-CM | POA: Diagnosis not present

## 2021-09-01 ENCOUNTER — Encounter: Payer: Self-pay | Admitting: *Deleted

## 2021-09-01 NOTE — Progress Notes (Signed)
Received a call from the patient's daughter, George Dyer. She had some questions about her father's plan of care and results.  ? ?Patient has decided against a gastrectomy for upfront treatment. He is scheduled for a diagnostic lap this Thursday and she's wondering if he should still have this procedure if he no longer wants surgery.  ? ?She also mentions that due to a blockage in his SVC they will place a PICC and not a port.  ? ?She also has multiple questions about his lab results which we reviewed together and I was able to answer her questions to her satisfaction.  ? ?Spoke to Dr Marin Olp about the surgery. He believes there is still benefit to the diagnostic lap to help with staging and treatment choice.  ? ?Notified George Dyer of Dr Antonieta Pert response.  ? ?Oncology Nurse Navigator Documentation ? ? ?  09/01/2021  ? 10:15 AM  ?Oncology Nurse Navigator Flowsheets  ?Navigator Follow Up Date: 09/03/2021  ?Navigator Follow Up Reason: Surgery  ?Navigator Location [facility]  ?Navigator Encounter Type Telephone  ?Telephone Diagnostic Results;Education;Patient Update  ?Patient Visit Type MedOnc  ?Treatment Phase Pre-Tx/Tx Discussion  ?Barriers/Navigation Needs Coordination of Care;Education  ?Education Other;Preparing for Upcoming Surgery/ Treatment  ?Interventions Education;Psycho-Social Support  ?Acuity Level 2-Minimal Needs (1-2 Barriers Identified)  ?Education Method Verbal  ?Support Groups/Services Friends and Family  ?Time Spent with Patient 30  ?  ?

## 2021-09-02 ENCOUNTER — Encounter: Payer: Self-pay | Admitting: *Deleted

## 2021-09-02 NOTE — Progress Notes (Signed)
Received a call from [place], patient's daughter. Her, the patient, and Dr Crisoforo Oxford have had more discussions and they are no longer planning on having the diagnostic surgery tomorrow. Sharyn Lull would like patient to have PICC placed at [facility] and start treatment with Dr Marin Olp.  ? ?Spoke to Dr Marin Olp. H will place orders for PICC and treatment orders. Will follow up tomorrow once all orders are placed to schedule. Sharyn Lull is aware of MD response.  ? ?Oncology Nurse Navigator Documentation ? ? ?  09/02/2021  ?  1:15 PM  ?Oncology Nurse Navigator Flowsheets  ?Navigator Follow Up Date: 09/03/2021  ?Navigator Follow Up Reason: Appointment Review  ?Navigator Location [facility]  ?Navigator Encounter Type Treatment  ?Telephone Incoming Call;Education  ?Patient Visit Type MedOnc  ?Treatment Phase Pre-Tx/Tx Discussion  ?Barriers/Navigation Needs Coordination of Care;Education  ?Education Other  ?Interventions Coordination of Care;Education;Psycho-Social Support  ?Acuity Level 2-Minimal Needs (1-2 Barriers Identified)  ?Coordination of Care Other  ?Education Method Verbal  ?Support Groups/Services Friends and Family  ?Time Spent with Patient 30  ?  ?

## 2021-09-03 ENCOUNTER — Other Ambulatory Visit: Payer: Self-pay | Admitting: Hematology & Oncology

## 2021-09-03 ENCOUNTER — Encounter: Payer: Self-pay | Admitting: *Deleted

## 2021-09-03 DIAGNOSIS — C160 Malignant neoplasm of cardia: Secondary | ICD-10-CM

## 2021-09-03 NOTE — Progress Notes (Signed)
Dr Marin Olp planning on speaking to Radiation Oncology regarding treatment plan for patient. Once this complete, he will enter orders. ? ?PICC scheduled for 09/09/21 at [facility].  ? ?Called and spoke to the patient's daughter, Sharyn Lull. She is aware of the PICC appointment including date, time and location. She is also aware that there will be a flushing protocol after placement. She would like me to discuss her being trained on how to flush the line to help minimize trips to the office.  ? ?Sharyn Lull is also aware that Dr Marin Olp will be speaking to Radiation Oncology and that we do not yet have a firm treatment plan. I will call her tomorrow with an update.  ? ?Oncology Nurse Navigator Documentation ? ? ?  09/03/2021  ?  9:30 AM  ?Oncology Nurse Navigator Flowsheets  ?Navigator Follow Up Date: 09/04/2021  ?Navigator Follow Up Reason: Other:  ?Financial planner  ?Navigator Encounter Type Appt/Treatment Plan Review;Telephone  ?Telephone Appt Confirmation/Clarification;Education;Outgoing Call  ?Patient Visit Type MedOnc  ?Treatment Phase Pre-Tx/Tx Discussion  ?Barriers/Navigation Needs Coordination of Care;Education  ?Education Other  ?Interventions Coordination of Care;Education;Psycho-Social Support  ?Acuity Level 2-Minimal Needs (1-2 Barriers Identified)  ?Coordination of Care Radiology  ?Education Method Verbal;Teach-back  ?Support Groups/Services Friends and Family  ?Time Spent with Patient 30  ?  ?

## 2021-09-04 ENCOUNTER — Encounter: Payer: Self-pay | Admitting: *Deleted

## 2021-09-04 NOTE — Progress Notes (Signed)
Spoke to Dr Marin Olp. He okays the patients daughter managing PICC care at home. Once PICC placed we will send prescription for supply to pharmacy.  ? ?We will schedule patient for PICC assessment and dressing change on 09/11/21 and have daughter receive PICC care teaching at that visit. He will still need to come into the office weekly for dressing changes.  ? ?Verita Lamb and she is aware of plan. She is aware of Friday appointment for PICC line teaching.  ? ?Oncology Nurse Navigator Documentation ? ? ?  09/04/2021  ?  2:30 PM  ?Oncology Nurse Navigator Flowsheets  ?Navigator Follow Up Date: 09/09/2021  ?Navigator Follow Up Reason: Port Placement  ?Navigator Location [facility]  ?Navigator Encounter Type Telephone  ?Telephone Appt Confirmation/Clarification;Education;Outgoing Call  ?Patient Visit Type MedOnc  ?Treatment Phase Pre-Tx/Tx Discussion  ?Barriers/Navigation Needs Coordination of Care;Education  ?Education Other  ?Interventions Coordination of Care;Education  ?Acuity Level 2-Minimal Needs (1-2 Barriers Identified)  ?Coordination of Care Appts  ?Education Method Verbal;Teach-back  ?Support Groups/Services Friends and Family  ?Time Spent with Patient 30  ?  ?

## 2021-09-08 ENCOUNTER — Other Ambulatory Visit: Payer: Self-pay | Admitting: Hematology & Oncology

## 2021-09-08 NOTE — Progress Notes (Signed)
START ON PATHWAY REGIMEN - Gastroesophageal ? ? ?  A cycle is every 14 days: ?    Oxaliplatin  ?    Leucovorin  ?    Fluorouracil  ?    Fluorouracil  ? ?**Always confirm dose/schedule in your pharmacy ordering system** ? ?Patient Characteristics: ?Distant Metastases (cM1/pM1) / Locally Recurrent Disease, Adenocarcinoma - Esophageal, [place], and Gastric, First Line, HER2 Negative/Unknown, PD?L1 Expression CPS < 5/Negative/Unknown, MSS/pMMR or MSI Unknown ?Histology: Adenocarcinoma ?Disease Classification: Gastric ?Therapeutic Status: Distant Metastases (No Additional Staging) ?Line of Therapy: First Line ?HER2 Status: Awaiting Test Results ?PD-L1 Expression Status: Awaiting Test Results ?Microsatellite/Mismatch Repair Status: MSS/pMMR ?Intent of Therapy: ?Curative Intent, Discussed with Patient ?

## 2021-09-09 ENCOUNTER — Encounter: Payer: Self-pay | Admitting: Hematology & Oncology

## 2021-09-09 ENCOUNTER — Encounter: Payer: Self-pay | Admitting: *Deleted

## 2021-09-09 ENCOUNTER — Other Ambulatory Visit (HOSPITAL_BASED_OUTPATIENT_CLINIC_OR_DEPARTMENT_OTHER): Payer: Self-pay

## 2021-09-09 ENCOUNTER — Other Ambulatory Visit: Payer: Self-pay | Admitting: *Deleted

## 2021-09-09 ENCOUNTER — Other Ambulatory Visit: Payer: Self-pay | Admitting: Hematology & Oncology

## 2021-09-09 ENCOUNTER — Ambulatory Visit (HOSPITAL_COMMUNITY)
Admission: RE | Admit: 2021-09-09 | Discharge: 2021-09-09 | Disposition: A | Discharge status: Home / Self Care | Payer: Medicare HMO | Source: Ambulatory Visit | Attending: Hematology & Oncology | Admitting: Hematology & Oncology

## 2021-09-09 DIAGNOSIS — C160 Malignant neoplasm of cardia: Secondary | ICD-10-CM

## 2021-09-09 DIAGNOSIS — C169 Malignant neoplasm of stomach, unspecified: Secondary | ICD-10-CM | POA: Diagnosis not present

## 2021-09-09 DIAGNOSIS — Z452 Encounter for adjustment and management of vascular access device: Secondary | ICD-10-CM | POA: Diagnosis not present

## 2021-09-09 MED ORDER — ONDANSETRON HCL 8 MG PO TABS: 8.0000 mg | ORAL_TABLET | Freq: Two times a day (BID) | ORAL | 1 refills | Status: DC | PRN

## 2021-09-09 MED ORDER — SODIUM CHLORIDE 0.9% FLUSH
INTRAVENOUS | 1 refills | Status: DC
  Filled 2021-09-09: qty 240, 56d supply, fill #0
  Filled 2021-10-23 – 2021-10-26 (×2): qty 240, 56d supply, fill #1

## 2021-09-09 MED ORDER — HEPARIN SOD (PORK) LOCK FLUSH 100 UNIT/ML IV SOLN
INTRAVENOUS | 1 refills | Status: DC
Start: 2021-09-09 — End: 2021-10-23
  Filled 2021-09-09: qty 120, 56d supply, fill #0
  Filled 2021-10-23 (×2): qty 120, 56d supply, fill #1
  Filled ????-??-??: fill #1

## 2021-09-09 MED ORDER — LIDOCAINE HCL 1 % IJ SOLN
INTRAMUSCULAR | Status: AC
  Filled 2021-09-09: qty 20

## 2021-09-09 MED ORDER — HEPARIN SOD (PORK) LOCK FLUSH 100 UNIT/ML IV SOLN
INTRAVENOUS | Status: AC
  Filled 2021-09-09: qty 5

## 2021-09-09 MED ORDER — DEXAMETHASONE 4 MG PO TABS: 8.0000 mg | ORAL_TABLET | Freq: Every day | ORAL | 1 refills | Status: DC

## 2021-09-09 MED ORDER — CHLORHEXIDINE GLUCONATE 2 % EX PADS
MEDICATED_PAD | CUTANEOUS | 0 refills | Status: DC
Start: 2021-09-09 — End: 2021-12-25
  Filled 2021-09-09: qty 100, 10d supply, fill #0

## 2021-09-09 MED ORDER — LIDOCAINE HCL 1 % IJ SOLN
INTRAMUSCULAR | Status: DC | PRN
Start: 2021-09-09 — End: 2021-09-10
  Administered 2021-09-09: 5 mL

## 2021-09-09 MED ORDER — PROCHLORPERAZINE MALEATE 10 MG PO TABS: 10.0000 mg | ORAL_TABLET | Freq: Four times a day (QID) | ORAL | 1 refills | Status: DC | PRN

## 2021-09-09 NOTE — Progress Notes (Signed)
Per Dr Ennever, HER2 and PDL1 requested on specimen [id number] DOS 08/06/2021. Spoke to Marsha at [facility].  ? ?Treatment plan placed and approved to start next week. Patient will need chemo education prior to start. Education scheduled and chemo educator notified.  ? ?PICC placed today. Family wishes to be taught how to care for PICC and will perform flushes at home. Patient scheduled to come in Friday for teaching. Prescriptions sent to pharmacy for heparin and saline flushes.  ? ?Called Michelle and reviewed upcoming appointments with her. She is aware of appointments including date, time and location. She asks that take home meds be sent to pharmacy today. They have been released and sent.  ? ?Oncology Nurse Navigator Documentation ? ? ?  09/09/2021  ?  2:30 PM  ?Oncology Nurse Navigator Flowsheets  ?Navigator Follow Up Date: 09/15/2021  ?Navigator Follow Up Reason: Chemo Class;Chemotherapy;Follow-up Appointment  ?Navigator Location [facility]  ?Navigator Encounter Type Appt/Treatment Plan Review;Education;Telephone  ?Telephone Education;Outgoing Call  ?Patient Visit Type MedOnc  ?Treatment Phase Pre-Tx/Tx Discussion  ?Barriers/Navigation Needs Coordination of Care;Education  ?Education Other  ?Interventions Coordination of Care;Education;Psycho-Social Support  ?Acuity Level 2-Minimal Needs (1-2 Barriers Identified)  ?Coordination of Care Appts;Other  ?Education Method Verbal;Teach-back  ?Support Groups/Services Friends and Family  ?Time Spent with Patient 30  ?  ?

## 2021-09-09 NOTE — Progress Notes (Signed)
Pharmacist Chemotherapy Monitoring - Initial Assessment   ? ?Anticipated start date: 09/15/21  ? ?The following has been reviewed per standard work regarding the patient's treatment regimen: ?The patient's diagnosis, treatment plan and drug doses, and organ/hematologic function ?Lab orders and baseline tests specific to treatment regimen  ?The treatment plan start date, drug sequencing, and pre-medications ?Prior authorization status  ?Patient's documented medication list, including drug-drug interaction screen and prescriptions for anti-emetics and supportive care specific to the treatment regimen ?The drug concentrations, fluid compatibility, administration routes, and timing of the medications to be used ?The patient's access for treatment and lifetime cumulative dose history, if applicable  ?The patient's medication allergies and previous infusion related reactions, if applicable  ? ?Changes made to treatment plan:  ?treatment plan date ? ?Follow up needed:  ?N/A ? ? ?Claybon Jabs, [place], ?09/09/2021  12:22 PM  ?

## 2021-09-09 NOTE — Progress Notes (Unsigned)
Spoke with Dr. Kathlene Cote about tip placement regarding PICC. He states  "He has a chronic SVC occlusion so the catheter tip is in the right brachiocephalic vein. There is no other option for tip placement." ? ?We will verify good blood return and monitor going forward. ?

## 2021-09-10 ENCOUNTER — Other Ambulatory Visit (HOSPITAL_COMMUNITY): Payer: Self-pay

## 2021-09-10 ENCOUNTER — Other Ambulatory Visit (HOSPITAL_BASED_OUTPATIENT_CLINIC_OR_DEPARTMENT_OTHER): Payer: Self-pay

## 2021-09-11 ENCOUNTER — Inpatient Hospital Stay: Payer: Medicare HMO

## 2021-09-11 ENCOUNTER — Other Ambulatory Visit (HOSPITAL_BASED_OUTPATIENT_CLINIC_OR_DEPARTMENT_OTHER): Payer: Self-pay

## 2021-09-11 ENCOUNTER — Inpatient Hospital Stay: Payer: Medicare HMO | Attending: Hematology & Oncology

## 2021-09-11 ENCOUNTER — Other Ambulatory Visit: Payer: Self-pay | Admitting: *Deleted

## 2021-09-11 VITALS — BP 122/70 | HR 78 | Temp 97.7°F | Resp 18

## 2021-09-11 DIAGNOSIS — Z79899 Other long term (current) drug therapy: Secondary | ICD-10-CM | POA: Diagnosis not present

## 2021-09-11 DIAGNOSIS — C160 Malignant neoplasm of cardia: Secondary | ICD-10-CM

## 2021-09-11 DIAGNOSIS — C162 Malignant neoplasm of body of stomach: Secondary | ICD-10-CM | POA: Diagnosis not present

## 2021-09-11 DIAGNOSIS — Z5111 Encounter for antineoplastic chemotherapy: Secondary | ICD-10-CM | POA: Diagnosis not present

## 2021-09-11 LAB — CMP (CANCER CENTER ONLY)
ALT: 9 U/L (ref 0–44)
AST: 10 U/L — ABNORMAL LOW (ref 15–41)
Albumin: 4.1 g/dL (ref 3.5–5.0)
Alkaline Phosphatase: 67 U/L (ref 38–126)
Anion gap: 7 (ref 5–15)
BUN: 14 mg/dL (ref 8–23)
CO2: 29 mmol/L (ref 22–32)
Calcium: 9.5 mg/dL (ref 8.9–10.3)
Chloride: 101 mmol/L (ref 98–111)
Creatinine: 1.01 mg/dL (ref 0.61–1.24)
GFR, Estimated: >60 mL/min (ref >60)
Glucose, Bld: 93 mg/dL (ref 70–99)
Potassium: 3.9 mmol/L (ref 3.5–5.1)
Sodium: 137 mmol/L (ref 135–145)
Total Bilirubin: 0.4 mg/dL (ref 0.3–1.2)
Total Protein: 6.6 g/dL (ref 6.5–8.1)

## 2021-09-11 LAB — CBC WITH DIFFERENTIAL (CANCER CENTER ONLY)
Abs Immature Granulocytes: 0.03 10*3/uL (ref 0.00–0.07)
Basophils Absolute: 0.1 10*3/uL (ref 0.0–0.1)
Basophils Relative: 1 %
Eosinophils Absolute: 0.2 10*3/uL (ref 0.0–0.5)
Eosinophils Relative: 3 %
HCT: 37.5 % — ABNORMAL LOW (ref 39.0–52.0)
Hemoglobin: 11.9 g/dL — ABNORMAL LOW (ref 13.0–17.0)
Immature Granulocytes: 0 %
Lymphocytes Relative: 20 %
Lymphs Abs: 1.5 10*3/uL (ref 0.7–4.0)
MCH: 27.8 pg (ref 26.0–34.0)
MCHC: 31.7 g/dL (ref 30.0–36.0)
MCV: 87.6 fL (ref 80.0–100.0)
Monocytes Absolute: 0.7 10*3/uL (ref 0.1–1.0)
Monocytes Relative: 9 %
Neutro Abs: 4.9 10*3/uL (ref 1.7–7.7)
Neutrophils Relative %: 67 %
Platelet Count: 383 10*3/uL (ref 150–400)
RBC: 4.28 MIL/uL (ref 4.22–5.81)
RDW: 13.2 % (ref 11.5–15.5)
WBC Count: 7.4 10*3/uL (ref 4.0–10.5)
nRBC: 0 % (ref 0.0–0.2)

## 2021-09-11 MED ORDER — HEPARIN SOD (PORK) LOCK FLUSH 100 UNIT/ML IV SOLN
500.0000 [IU] | Freq: Once | INTRAVENOUS | Status: AC
  Administered 2021-09-11: 500 [IU] via INTRAVENOUS

## 2021-09-11 MED ORDER — SODIUM CHLORIDE 0.9% FLUSH
10.0000 mL | INTRAVENOUS | Status: DC | PRN
  Administered 2021-09-11: 10 mL via INTRAVENOUS

## 2021-09-11 NOTE — Patient Instructions (Signed)
PICC Home Care Guide A peripherally inserted central catheter (PICC) is a form of IV access that allows medicines and IV fluids to be quickly put into the blood and spread throughout the body. The PICC is a long, thin, flexible tube (catheter) that is put into a vein in a person's arm or leg. The catheter ends in a large vein just outside the heart called the superior vena cava (SVC). After the PICC is put in, a chest X-ray may be done to make sure that it is in the right place. A PICC may be placed for different reasons, such as: To give medicines and liquid nutrition. To give IV fluids and blood products. To take blood samples often. If there is trouble placing a peripheral intravenous (PIV) catheter. If cared for properly, a PICC can remain in place for many months. Having a PICC can allow you to go home from the hospital sooner and continue treatment at home. Medicines and PICC care can be managed at home by a family member, caregiver, or home health care team. What are the risks? Generally, having a PICC is safe. However, problems may occur, including: A blood clot (thrombus) forming in or at the end of the PICC. A blood clot forming in a vein (deep vein thrombosis) or traveling to the lung (pulmonary embolism). Inflammation of the vein (phlebitis) in which the PICC is placed. Infection at the insertion site or in the blood. Blood infections from central lines, like PICCs, can be serious and often require a hospital stay. PICC malposition, or PICC movement or poor placement. A break or cut in the PICC. Do not use scissors near the PICC. Nerve or tendon irritation or injury during PICC insertion. How to care for your PICC Please follow the specific guidelines provided by your health care provider. Preventing infection You and any caregivers should wash your hands often with soap and water for at least 20 seconds. Wash hands: Before touching the PICC or the infusion device. Before changing a  bandage (dressing). Do not change the dressing unless you have been taught to do so and have shown you are able to change it safely. Flush the PICC as told. Tell your health care provider right away if the PICC is hard to flush or does not flush. Do not use force to flush the PICC. Use clean and germ-free (sterile) supplies only. Keep the supplies in a dry place. Do not reuse needles, syringes, or any other supplies. Reusing supplies can lead to infection. Keep the PICC dressing dry and secure it with tape if the edges stop sticking to your skin. Check your PICC insertion site every day for signs of infection. Check for: Redness, swelling, or pain. Fluid or blood. Warmth. Pus or a bad smell. Preventing other problems Do not use a syringe that is less than 10 mL to flush the PICC. Do not have your blood pressure checked on the arm in which the PICC is placed. Do not ever pull or tug on the PICC. Keep it secured to your arm with tape or a stretch wrap when not in use. Do not take the PICC out yourself. Only a trained health care provider should remove the PICC. Keep pets and children away from your PICC. How to care for your PICC dressing Keep your PICC dressing clean and dry to prevent infection. Do not take baths, swim, or use a hot tub until your health care provider approves. Ask your health care provider if you can take   showers. You may only be allowed to take sponge baths. When you are allowed to shower: Ask your health care provider to teach you how to wrap the PICC. Cover the PICC with clear plastic wrap and tape to keep it dry while showering. Follow instructions from your health care provider about how to take care of your insertion site and dressing. Make sure you: Wash your hands with soap and water for at least 20 seconds before and after you change your dressing. If soap and water are not available, use hand sanitizer. Change your dressing only if taught to do so by your health care  provider. Your PICC dressing needs to be changed if it becomes loose or wet. Leave stitches (sutures), skin glue, or adhesive strips in place. These skin closures may need to stay in place for 2 weeks or longer. If adhesive strip edges start to loosen and curl up, you may trim the loose edges. Do not remove adhesive strips completely unless your health care provider tells you to do that. Follow these instructions at home: Disposal of supplies Throw away any syringes in a disposal container that is meant for sharp items (sharps container). You can buy a sharps container from a pharmacy, or you can make one by using an empty, hard plastic bottle with a lid. Place any used dressings or infusion bags into a plastic bag. Throw that bag in the trash. General instructions  Always carry your PICC identification card or wear a medical alert bracelet. Keep the tube clamped at all times, unless it is being used. Always carry a smooth-edge clamp with you to clamp the PICC if it breaks. Do not use scissors or sharp objects near the tube. You may bend your arm and move it freely. If your PICC is near or at the bend of your elbow, avoid activity with repeated motion at the elbow. Avoid lifting heavy objects as told by your health care provider. Keep all follow-up visits. This is important. You will need to have your PICC dressing changed at least once a week. Contact a health care provider if: You have pain in your arm, ear, face, or teeth. You have a fever or chills. You have redness, swelling, or pain around the insertion site. You have fluid or blood coming from the insertion site. Your insertion site feels warm to the touch. You have pus or a bad smell coming from the insertion site. Your skin feels hard and raised around the insertion site. Your PICC dressing has gotten wet or is coming off and you have not been taught how to change it. Get help right away if: You have problems with your PICC, such as  your PICC: Was tugged or pulled and has partially come out. Do not  push the PICC back in. Cannot be flushed, is hard to flush, or leaks around the insertion site when it is flushed. Makes a flushing sound when it is flushed. Appears to have a hole or tear. Is accidentally pulled all the way out. If this happens, cover the insertion site with a gauze dressing. Do not throw the PICC away. Your health care provider will need to check it to be sure the entire catheter came out. You feel your heart racing or skipping beats, or you have chest pain. You have shortness of breath or trouble breathing. You have swelling, redness, warmth, or pain in the arm in which the PICC is placed. You have a red streak going up your arm that   starts under the PICC dressing. These symptoms may be an emergency. Get help right away. Call 911. Do not wait to see if the symptoms will go away. Do not drive yourself to the hospital. Summary A peripherally inserted central catheter (PICC) is a long, thin, flexible tube (catheter) that is put into a vein in the arm or leg. If cared for properly, a PICC can remain in place for many months. Having a PICC can allow you to go home from the hospital sooner and continue treatment at home. The PICC is inserted using a germ-free (sterile) technique by a specially trained health care provider. Only a trained health care provider should remove it. Do not have your blood pressure checked on the arm in which your PICC is placed. Always keep your PICC identification card with you. This information is not intended to replace advice given to you by your health care provider. Make sure you discuss any questions you have with your health care provider. Document Revised: 12/10/2020 Document Reviewed: 12/10/2020 Elsevier Patient Education  2022 [employer] Inc[employer]  

## 2021-09-15 ENCOUNTER — Inpatient Hospital Stay: Payer: Medicare HMO

## 2021-09-15 ENCOUNTER — Encounter: Payer: Self-pay | Admitting: Hematology & Oncology

## 2021-09-15 ENCOUNTER — Encounter: Payer: Self-pay | Admitting: *Deleted

## 2021-09-15 ENCOUNTER — Inpatient Hospital Stay: Payer: Medicare HMO | Admitting: Hematology & Oncology

## 2021-09-15 ENCOUNTER — Other Ambulatory Visit: Payer: Self-pay

## 2021-09-15 VITALS — BP 136/69 | HR 78 | Temp 98.2°F | Resp 18 | Ht 69.0 in | Wt 152.0 lb

## 2021-09-15 VITALS — BP 136/69 | HR 78 | Temp 98.2°F | Resp 17 | Wt 152.0 lb

## 2021-09-15 DIAGNOSIS — Z5111 Encounter for antineoplastic chemotherapy: Secondary | ICD-10-CM | POA: Diagnosis not present

## 2021-09-15 DIAGNOSIS — C160 Malignant neoplasm of cardia: Secondary | ICD-10-CM

## 2021-09-15 DIAGNOSIS — C162 Malignant neoplasm of body of stomach: Secondary | ICD-10-CM | POA: Diagnosis not present

## 2021-09-15 DIAGNOSIS — Z79899 Other long term (current) drug therapy: Secondary | ICD-10-CM | POA: Diagnosis not present

## 2021-09-15 MED ORDER — DEXTROSE 5 % IV SOLN: Freq: Once | INTRAVENOUS | Status: AC

## 2021-09-15 MED ORDER — OXALIPLATIN CHEMO INJECTION 100 MG/20ML
76.5000 mg/m2 | Freq: Once | INTRAVENOUS | Status: AC
  Administered 2021-09-15: 140 mg via INTRAVENOUS
  Filled 2021-09-15: qty 28

## 2021-09-15 MED ORDER — LEUCOVORIN CALCIUM INJECTION 350 MG
400.0000 mg/m2 | Freq: Once | INTRAVENOUS | Status: AC
  Administered 2021-09-15: 728 mg via INTRAVENOUS
  Filled 2021-09-15: qty 36.4

## 2021-09-15 MED ORDER — SODIUM CHLORIDE 0.9 % IV SOLN
1920.0000 mg/m2 | INTRAVENOUS | Status: DC
  Administered 2021-09-15: 3500 mg via INTRAVENOUS
  Filled 2021-09-15: qty 70

## 2021-09-15 MED ORDER — SODIUM CHLORIDE 0.9 % IV SOLN
10.0000 mg | Freq: Once | INTRAVENOUS | Status: AC
  Administered 2021-09-15: 10 mg via INTRAVENOUS
  Filled 2021-09-15: qty 10

## 2021-09-15 MED ORDER — PALONOSETRON HCL INJECTION 0.25 MG/5ML
0.2500 mg | Freq: Once | INTRAVENOUS | Status: AC
  Administered 2021-09-15: 0.25 mg via INTRAVENOUS
  Filled 2021-09-15: qty 5

## 2021-09-15 MED ORDER — FLUOROURACIL CHEMO INJECTION 2.5 GM/50ML
360.0000 mg/m2 | Freq: Once | INTRAVENOUS | Status: AC
  Administered 2021-09-15: 650 mg via INTRAVENOUS
  Filled 2021-09-15: qty 13

## 2021-09-15 MED ORDER — SODIUM CHLORIDE 0.9% FLUSH
10.0000 mL | INTRAVENOUS | Status: DC | PRN
  Administered 2021-09-15: 10 mL

## 2021-09-15 MED ORDER — HEPARIN SOD (PORK) LOCK FLUSH 100 UNIT/ML IV SOLN
250.0000 [IU] | Freq: Once | INTRAVENOUS | Status: AC | PRN
  Administered 2021-09-15: 250 [IU]

## 2021-09-15 NOTE — Progress Notes (Signed)
?Hematology and Oncology Follow Up Visit ? ?George Dyer ?[id number] ?Dec 22, 1940 81 y.o. ?09/15/2021 ? ? ?Principle Diagnosis:  ?Stage IIB (T3N0M0) Gastric cancer -- declines surgery ? ?Current Therapy:   ?FOLFOX - start cycle #1 on 09/15/2021 ?    ?Interim History:  [name] is back for follow-up.  He saw Dr. Crisoforo Oxford at [facility].  Dr. Crisoforo Oxford wanted to do a laparoscopic evaluation to make sure that the paraesophageal lymph nodes were not malignant.  Unfortunately, Mr. Luckey did not feel that he would be ready for any surgery.  He did not think that he would be able to get through surgery.  As such, we are going to go with potentially curative chemoradiation therapy. ? ?I think would be reasonable to treat him with upfront systemic chemotherapy and then go with chemoradiation therapy and follow this with systemic chemotherapy again. ? ?He is in good shape.  He did have a PET scan done on 08/20/2021.  This did show the hypermetabolism in the posterior gastric fundus.  There is no obvious metabolic disease in the chest abdomen pelvis although he did have the indeterminant area in the superior right hilum. ? ?I have to believe that we will have a decent chance of trying to cure this with definitive chemotherapy and radiation therapy. ? ?We will start him with upfront FOLFOX.  I would give him 4 cycles of FOLFOX.  I would then reevaluate him with another PET scan and probable endoscopy. ? ?He is eating okay.  He is not having any obvious bleeding.  There is no change in bowel or bladder habits. ? ?He did have a PICC line placed because there was a past history of an SVC occlusion and because of this, we cannot place a Port-A-Cath. ? ?He has had a good performance status throughout the entire evaluation.  Currently, his performance status is probably ECOG 1. ? ?Medications:  ?Current Outpatient Medications:  ?  acetaminophen (TYLENOL) 650 MG CR tablet, Take 1,300 mg by mouth 2 (two) times daily., Disp: , Rfl:  ?  alum  & mag hydroxide-simeth (MAALOX/MYLANTA) 200-200-20 MG/5ML suspension, Take 30 mLs by mouth daily., Disp: , Rfl:  ?  Chlorhexidine Gluconate 2 % PADS, Use to sterilize PICC ports prior to flushing, Disp: 100 each, Rfl: 0 ?  dexamethasone (DECADRON) 4 MG tablet, Take 2 tablets (8 mg total) by mouth daily. Start the day after chemotherapy for 2 days. Take with food., Disp: 30 tablet, Rfl: 1 ?  heparin lock flush 100 UNIT/ML SOLN injection, Dispense 24 syringes with 2.79m dose (discard remainder). Flush each PICC lumen Monday, Wednesday and Friday, Disp: 120 mL, Rfl: 1 ?  lisinopril-hydrochlorothiazide (ZESTORETIC) 20-12.5 MG tablet, Take 1 tablet by mouth daily., Disp: 90 tablet, Rfl: 3 ?  omeprazole (PRILOSEC) 40 MG capsule, Take 1 capsule (40 mg total) by mouth 2 (two) times daily before a meal., Disp: 60 capsule, Rfl: 12 ?  ondansetron (ZOFRAN) 8 MG tablet, Take 1 tablet (8 mg total) by mouth 2 (two) times daily as needed for refractory nausea / vomiting. Start on day 3 after chemotherapy., Disp: 30 tablet, Rfl: 1 ?  polyethylene glycol (MIRALAX / GLYCOLAX) 17 g packet, Take 17 g by mouth daily., Disp: , Rfl:  ?  prochlorperazine (COMPAZINE) 10 MG tablet, Take 1 tablet (10 mg total) by mouth every 6 (six) hours as needed (Nausea or vomiting)., Disp: 30 tablet, Rfl: 1 ?  sodium chloride flush (NS) 0.9 % SOLN, Inject 10 mLs into each port  3 (three) times a week. Flush each port of PICC Monday, Wednesday and Friday., Disp: 240 mL, Rfl: 1 ?No current facility-administered medications for this visit. ? ?Facility-Administered Medications Ordered in Other Visits:  ?  fluorouracil (ADRUCIL) 3,500 mg in sodium chloride 0.9 % 80 mL chemo infusion, 1,920 mg/m2 (Treatment Plan Recorded), Intravenous, 1 day or 1 dose, Maleaha Hughett, Rudell Cobb, MD, 3,500 mg at 09/15/21 1256 ?  sodium chloride flush (NS) 0.9 % injection 10 mL, 10 mL, Intracatheter, PRN, Volanda Napoleon, MD, 10 mL at 09/15/21 1610 ? ?Allergies:  ?Allergies  ?Allergen  Reactions  ? Percocet [Oxycodone-Acetaminophen] Nausea And Vomiting  ? Oxycodone-Aspirin Nausea And Vomiting  ? ? ?Past Medical History, Surgical history, Social history, and Family History were reviewed and updated. ? ?Review of Systems: ?Review of Systems  ?Constitutional:  Positive for fatigue.  ?HENT:  Negative.    ?Eyes: Negative.   ?Respiratory: Negative.    ?Cardiovascular: Negative.   ?Gastrointestinal:  Positive for nausea.  ?Endocrine: Negative.   ?Genitourinary: Negative.    ?Musculoskeletal: Negative.   ?Skin: Negative.   ?Neurological: Negative.   ?Hematological: Negative.   ?Psychiatric/Behavioral: Negative.    ? ?Physical Exam: ? height is _0  (1.753 m) and weight is 152 lb (68.9 kg). His oral temperature is 98.2 ?F (36.8 ?C). His blood pressure is 136/69 and his pulse is 78. His respiration is 18 and oxygen saturation is 100%.  ? ?Wt Readings from Last 3 Encounters:  ?09/15/21 152 lb (68.9 kg)  ?09/15/21 152 lb (68.9 kg)  ?08/18/21 150 lb (68 kg)  ? ? ?Physical Exam ?Vitals reviewed.  ?HENT:  ?   Head: Normocephalic and atraumatic.  ?Eyes:  ?   Pupils: Pupils are equal, round, and reactive to light.  ?Cardiovascular:  ?   Rate and Rhythm: Normal rate and regular rhythm.  ?   Heart sounds: Normal heart sounds.  ?Pulmonary:  ?   Effort: Pulmonary effort is normal.  ?   Breath sounds: Normal breath sounds.  ?Abdominal:  ?   General: Bowel sounds are normal.  ?   Palpations: Abdomen is soft.  ?Musculoskeletal:     ?   General: No tenderness or deformity. Normal range of motion.  ?   Cervical back: Normal range of motion.  ?Lymphadenopathy:  ?   Cervical: No cervical adenopathy.  ?Skin: ?   General: Skin is warm and dry.  ?   Findings: No erythema or rash.  ?Neurological:  ?   Mental Status: He is alert and oriented to person, place, and time.  ?Psychiatric:     ?   Behavior: Behavior normal.     ?   Thought Content: Thought content normal.     ?   Judgment: Judgment normal.  ? ? ? ?Lab Results   ?Component Value Date  ? WBC 7.4 09/11/2021  ? HGB 11.9 (L) 09/11/2021  ? HCT 37.5 (L) 09/11/2021  ? MCV 87.6 09/11/2021  ? PLT 383 09/11/2021  ? ?  Chemistry   ?   ?Component Value Date/Time  ? NA 137 09/11/2021 0841  ? NA 139 07/23/2020 0818  ? K 3.9 09/11/2021 0841  ? CL 101 09/11/2021 0841  ? CO2 29 09/11/2021 0841  ? BUN 14 09/11/2021 0841  ? BUN 10 07/23/2020 0818  ? CREATININE 1.01 09/11/2021 0841  ? CREATININE 1.06 06/27/2015 0837  ?    ?Component Value Date/Time  ? CALCIUM 9.5 09/11/2021 0841  ? ALKPHOS 67 09/11/2021 0841  ?  AST 10 (L) 09/11/2021 0841  ? ALT 9 09/11/2021 0841  ? BILITOT 0.4 09/11/2021 0841  ?  ? ? ?Impression and Plan: ?Mr. Vercher is a very nice 81 year old white male.  He has what I hope to believe localized gastric cancer.  His PET scan certainly was encouraging for not having any obvious advanced or locally advanced disease. ? ?Again, we will go ahead with upfront chemotherapy.  We will use FOLFOX for 4 cycles.  After this, we will then use Xeloda with radiation therapy. ? ?Hopefully, he will have had a very good response to the FOLFOX. ? ?Again, he is in good shape.  He has great support from his family.  He is incredibly motivated.  I want to try to be as aggressive as possible. ? ?We will plan to get him back in another 2 weeks for second cycle of treatment. ? ? ?Volanda Napoleon, MD ?4/11/20231:07 PM  ?

## 2021-09-15 NOTE — Progress Notes (Signed)
Patient in chemotherapy education class with  daughters and his wife.  Discussed side effects of 5FU, Oxaliplatin    which include but are not limited to myelosuppression, decreased appetite, fatigue, fever, allergic or infusional reaction, mucositis, cardiac toxicity, cough, SOB, altered taste, nausea and vomiting, diarrhea, constipation, elevated LFTs myalgia and arthralgias, hair loss or thinning, rash, skin dryness, nail changes, peripheral neuropathy, discolored urine, delayed wound healing, mental changes (Chemo brain), increased risk of infections, weight loss.  Reviewed infusion room and office policy and procedure and phone numbers 24 hours x 7 days a week.  Reviewed ambulatory pump specifics and how to manage safe handling at home.  Reviewed when to call the office with any concerns or problems.  Scientist, clinical (histocompatibility and immunogenetics) given.  Discussed portacath insertion and EMLA cream administration.  Antiemetic protocol and chemotherapy schedule reviewed. Patient verbalized understanding of chemotherapy indications and possible side effects.  Teachback done  ?

## 2021-09-15 NOTE — Progress Notes (Signed)
Patient and family here for patient to begin chemo. He met with Dr Marin Olp to discuss treatment plan and then with the chemo educator. Wife and two daughters are here for the MD visit and to participate in chemo education. Patient feels ready to begin but does appear nervous. Family knows to reach out with any questions or concerns.  ? ?Oncology Nurse Navigator Documentation ? ? ?  09/15/2021  ?  9:00 AM  ?Oncology Nurse Navigator Flowsheets  ?Phase of Treatment Chemo  ?Chemotherapy Actual Start Date: 09/15/2021  ?Navigator Follow Up Date: 09/28/2021  ?Navigator Follow Up Reason: Follow-up Appointment;Chemotherapy  ?Navigator Location [facility]  ?Navigator Encounter Type Treatment;Appt/Treatment Plan Review  ?Treatment Initiated Date 09/15/2021  ?Patient Visit Type MedOnc  ?Treatment Phase First Chemo Tx  ?Barriers/Navigation Needs Coordination of Care;Education  ?Interventions Coordination of Care;Education;Psycho-Social Support  ?Acuity Level 2-Minimal Needs (1-2 Barriers Identified)  ?Education Method Verbal  ?Support Groups/Services Friends and Family  ?Time Spent with Patient 30  ?  ?

## 2021-09-15 NOTE — Patient Instructions (Signed)
[facility]  Discharge Instructions: ?Thank you for choosing [facility] to provide your oncology and hematology care.  ? ?If you have a lab appointment with the [facility], please go directly to the [facility] and check in at the registration area. ? ?Wear comfortable clothing and clothing appropriate for easy access to any Portacath or PICC line.  ? ?We strive to give you quality time with your provider. You may need to reschedule your appointment if you arrive late (15 or more minutes).  Arriving late affects you and other patients whose appointments are after yours.  Also, if you miss three or more appointments without notifying the office, you may be dismissed from the clinic at the provider?s discretion.    ?  ?For prescription refill requests, have your pharmacy contact our office and allow 72 hours for refills to be completed.   ? ?Today you received the following chemotherapy and/or immunotherapy agents Oxaliplatin, Leucovorin and Fluorouracil    ?  ?To help prevent nausea and vomiting after your treatment, we encourage you to take your nausea medication as directed. ? ?BELOW ARE SYMPTOMS THAT SHOULD BE REPORTED IMMEDIATELY: ?*FEVER GREATER THAN 100.4 F (38 ?C) OR HIGHER ?*CHILLS OR SWEATING ?*NAUSEA AND VOMITING THAT IS NOT CONTROLLED WITH YOUR NAUSEA MEDICATION ?*UNUSUAL SHORTNESS OF BREATH ?*UNUSUAL BRUISING OR BLEEDING ?*URINARY PROBLEMS (pain or burning when urinating, or frequent urination) ?*BOWEL PROBLEMS (unusual diarrhea, constipation, pain near the anus) ?TENDERNESS IN MOUTH AND THROAT WITH OR WITHOUT PRESENCE OF ULCERS (sore throat, sores in mouth, or a toothache) ?UNUSUAL RASH, SWELLING OR PAIN  ?UNUSUAL VAGINAL DISCHARGE OR ITCHING  ? ?Items with * indicate a potential emergency and should be followed up as soon as possible or go to the Emergency Department if any problems should occur. ? ?Please show the CHEMOTHERAPY ALERT CARD or IMMUNOTHERAPY  ALERT CARD at check-in to the Emergency Department and triage nurse. ?Should you have questions after your visit or need to cancel or reschedule your appointment, please contact [facility]  [phone] and follow the prompts.  Office hours are 8:00 a.m. to 4:30 p.m. Monday - Friday. Please note that voicemails left after 4:00 p.m. may not be returned until the following business day.  We are closed weekends and major holidays. You have access to a nurse at all times for urgent questions. Please call the main number to the clinic [phone] and follow the prompts. ? ?For any non-urgent questions, you may also contact your provider using MyChart. We now offer e-Visits for anyone 52 and older to request care online for non-urgent symptoms. For details visit [email or web address]. ?  ?Also download the MyChart app! Go to the app store, search "MyChart", open the app, select [facility], and log in with your MyChart username and password. ? ?Due to Covid, a mask is required upon entering the hospital/clinic. If you do not have a mask, one will be given to you upon arrival. For doctor visits, patients may have 1 support person aged 77 or older with them. For treatment visits, patients cannot have anyone with them due to current Covid guidelines and our immunocompromised population.  ?

## 2021-09-16 ENCOUNTER — Encounter: Payer: Self-pay | Admitting: Hematology & Oncology

## 2021-09-17 ENCOUNTER — Other Ambulatory Visit: Payer: Self-pay | Admitting: *Deleted

## 2021-09-17 ENCOUNTER — Inpatient Hospital Stay: Payer: Medicare HMO

## 2021-09-17 ENCOUNTER — Telehealth: Payer: Self-pay

## 2021-09-17 DIAGNOSIS — C162 Malignant neoplasm of body of stomach: Secondary | ICD-10-CM | POA: Diagnosis not present

## 2021-09-17 DIAGNOSIS — Z79899 Other long term (current) drug therapy: Secondary | ICD-10-CM | POA: Diagnosis not present

## 2021-09-17 DIAGNOSIS — Z5111 Encounter for antineoplastic chemotherapy: Secondary | ICD-10-CM | POA: Diagnosis not present

## 2021-09-17 DIAGNOSIS — R350 Frequency of micturition: Secondary | ICD-10-CM

## 2021-09-17 DIAGNOSIS — C160 Malignant neoplasm of cardia: Secondary | ICD-10-CM

## 2021-09-17 LAB — URINALYSIS, COMPLETE (UACMP) WITH MICROSCOPIC
Bilirubin Urine: NEGATIVE
Glucose, UA: NEGATIVE mg/dL
Hgb urine dipstick: NEGATIVE
Ketones, ur: NEGATIVE mg/dL
Leukocytes,Ua: NEGATIVE
Nitrite: NEGATIVE
Protein, ur: NEGATIVE mg/dL
Specific Gravity, Urine: 1.01 (ref 1.005–1.030)
pH: 7 (ref 5.0–8.0)

## 2021-09-17 MED ORDER — SODIUM CHLORIDE 0.9% FLUSH
10.0000 mL | Freq: Once | INTRAVENOUS | Status: AC
  Administered 2021-09-17: 10 mL via INTRAVENOUS

## 2021-09-17 MED ORDER — HEPARIN SOD (PORK) LOCK FLUSH 100 UNIT/ML IV SOLN
500.0000 [IU] | Freq: Once | INTRAVENOUS | Status: AC
  Administered 2021-09-17: 500 [IU] via INTRAVENOUS

## 2021-09-17 NOTE — Patient Instructions (Signed)

## 2021-09-17 NOTE — Telephone Encounter (Signed)
Called and let patient and his wife know that the urinalysis was negative for infection. Wife very appreciative of call and aware of next appointment. Pt and wife aware to call with any questions or concerns. Pt and wife verbalized understanding and had no further questions. ?

## 2021-09-18 ENCOUNTER — Encounter: Payer: Self-pay | Admitting: *Deleted

## 2021-09-28 ENCOUNTER — Ambulatory Visit: Payer: Medicare HMO

## 2021-09-28 ENCOUNTER — Encounter: Payer: Self-pay | Admitting: *Deleted

## 2021-09-28 ENCOUNTER — Inpatient Hospital Stay: Payer: Medicare HMO | Admitting: Hematology & Oncology

## 2021-09-28 ENCOUNTER — Inpatient Hospital Stay: Payer: Medicare HMO

## 2021-09-28 ENCOUNTER — Telehealth: Payer: Self-pay

## 2021-09-28 ENCOUNTER — Encounter: Payer: Self-pay | Admitting: Hematology & Oncology

## 2021-09-28 ENCOUNTER — Other Ambulatory Visit: Payer: Self-pay | Admitting: Family

## 2021-09-28 VITALS — BP 117/60 | HR 85 | Temp 97.4°F | Resp 20 | Wt 155.0 lb

## 2021-09-28 DIAGNOSIS — C160 Malignant neoplasm of cardia: Secondary | ICD-10-CM

## 2021-09-28 DIAGNOSIS — Z79899 Other long term (current) drug therapy: Secondary | ICD-10-CM | POA: Diagnosis not present

## 2021-09-28 DIAGNOSIS — Z5111 Encounter for antineoplastic chemotherapy: Secondary | ICD-10-CM | POA: Diagnosis not present

## 2021-09-28 DIAGNOSIS — C162 Malignant neoplasm of body of stomach: Secondary | ICD-10-CM | POA: Diagnosis not present

## 2021-09-28 LAB — FERRITIN: Ferritin: 36 ng/mL (ref 24–336)

## 2021-09-28 LAB — CMP (CANCER CENTER ONLY)
ALT: 33 U/L (ref 0–44)
AST: 16 U/L (ref 15–41)
Albumin: 3.8 g/dL (ref 3.5–5.0)
Alkaline Phosphatase: 86 U/L (ref 38–126)
Anion gap: 8 (ref 5–15)
BUN: 23 mg/dL (ref 8–23)
CO2: 27 mmol/L (ref 22–32)
Calcium: 9.1 mg/dL (ref 8.9–10.3)
Chloride: 101 mmol/L (ref 98–111)
Creatinine: 1.35 mg/dL — ABNORMAL HIGH (ref 0.61–1.24)
GFR, Estimated: 53 mL/min — ABNORMAL LOW (ref >60)
Glucose, Bld: 123 mg/dL — ABNORMAL HIGH (ref 70–99)
Potassium: 4 mmol/L (ref 3.5–5.1)
Sodium: 136 mmol/L (ref 135–145)
Total Bilirubin: 0.3 mg/dL (ref 0.3–1.2)
Total Protein: 6.6 g/dL (ref 6.5–8.1)

## 2021-09-28 LAB — CBC WITH DIFFERENTIAL (CANCER CENTER ONLY)
Abs Immature Granulocytes: 0.04 10*3/uL (ref 0.00–0.07)
Basophils Absolute: 0.1 10*3/uL (ref 0.0–0.1)
Basophils Relative: 1 %
Eosinophils Absolute: 0.2 10*3/uL (ref 0.0–0.5)
Eosinophils Relative: 3 %
HCT: 37 % — ABNORMAL LOW (ref 39.0–52.0)
Hemoglobin: 11.8 g/dL — ABNORMAL LOW (ref 13.0–17.0)
Immature Granulocytes: 1 %
Lymphocytes Relative: 19 %
Lymphs Abs: 1.7 10*3/uL (ref 0.7–4.0)
MCH: 27.7 pg (ref 26.0–34.0)
MCHC: 31.9 g/dL (ref 30.0–36.0)
MCV: 86.9 fL (ref 80.0–100.0)
Monocytes Absolute: 1 10*3/uL (ref 0.1–1.0)
Monocytes Relative: 12 %
Neutro Abs: 5.7 10*3/uL (ref 1.7–7.7)
Neutrophils Relative %: 64 %
Platelet Count: 336 10*3/uL (ref 150–400)
RBC: 4.26 MIL/uL (ref 4.22–5.81)
RDW: 14 % (ref 11.5–15.5)
WBC Count: 8.7 10*3/uL (ref 4.0–10.5)
nRBC: 0 % (ref 0.0–0.2)

## 2021-09-28 LAB — IRON AND IRON BINDING CAPACITY (CC-WL,HP ONLY)
Iron: 59 ug/dL (ref 45–182)
Saturation Ratios: 15 % — ABNORMAL LOW (ref 17.9–39.5)
TIBC: 386 ug/dL (ref 250–450)
UIBC: 327 ug/dL (ref 117–376)

## 2021-09-28 MED ORDER — DEXTROSE 5 % IV SOLN: Freq: Once | INTRAVENOUS | Status: DC

## 2021-09-28 MED ORDER — HEPARIN SOD (PORK) LOCK FLUSH 100 UNIT/ML IV SOLN: 250.0000 [IU] | Freq: Once | INTRAVENOUS | Status: DC | PRN

## 2021-09-28 MED ORDER — PALONOSETRON HCL INJECTION 0.25 MG/5ML
0.2500 mg | Freq: Once | INTRAVENOUS | Status: AC
  Administered 2021-09-28: 0.25 mg via INTRAVENOUS
  Filled 2021-09-28: qty 5

## 2021-09-28 MED ORDER — OXALIPLATIN CHEMO INJECTION 100 MG/20ML
76.5000 mg/m2 | Freq: Once | INTRAVENOUS | Status: AC
  Administered 2021-09-28: 140 mg via INTRAVENOUS
  Filled 2021-09-28: qty 28

## 2021-09-28 MED ORDER — ALTEPLASE 2 MG IJ SOLR
2.0000 mg | Freq: Once | INTRAMUSCULAR | Status: AC
  Administered 2021-09-28: 2 mg
  Filled 2021-09-28: qty 2

## 2021-09-28 MED ORDER — LEUCOVORIN CALCIUM INJECTION 350 MG
400.0000 mg/m2 | Freq: Once | INTRAVENOUS | Status: AC
  Administered 2021-09-28: 728 mg via INTRAVENOUS
  Filled 2021-09-28: qty 36.4

## 2021-09-28 MED ORDER — SODIUM CHLORIDE 0.9 % IV SOLN
10.0000 mg | Freq: Once | INTRAVENOUS | Status: AC
  Administered 2021-09-28: 10 mg via INTRAVENOUS
  Filled 2021-09-28: qty 10

## 2021-09-28 MED ORDER — SODIUM CHLORIDE 0.9 % IV SOLN
1920.0000 mg/m2 | INTRAVENOUS | Status: DC
  Administered 2021-09-28: 3500 mg via INTRAVENOUS
  Filled 2021-09-28: qty 70

## 2021-09-28 MED ORDER — HEPARIN SOD (PORK) LOCK FLUSH 100 UNIT/ML IV SOLN: 500.0000 [IU] | Freq: Once | INTRAVENOUS | Status: DC | PRN

## 2021-09-28 MED ORDER — FLUOROURACIL CHEMO INJECTION 2.5 GM/50ML
360.0000 mg/m2 | Freq: Once | INTRAVENOUS | Status: AC
  Administered 2021-09-28: 650 mg via INTRAVENOUS
  Filled 2021-09-28: qty 13

## 2021-09-28 MED ORDER — SODIUM CHLORIDE 0.9 % IV SOLN: Freq: Once | INTRAVENOUS | Status: DC

## 2021-09-28 MED ORDER — SODIUM CHLORIDE 0.9 % IV SOLN
510.0000 mg | Freq: Once | INTRAVENOUS | Status: AC
  Administered 2021-09-28: 510 mg via INTRAVENOUS
  Filled 2021-09-28: qty 17

## 2021-09-28 MED ORDER — SODIUM CHLORIDE 0.9% FLUSH: 10.0000 mL | Freq: Once | INTRAVENOUS | Status: DC | PRN

## 2021-09-28 NOTE — Addendum Note (Signed)
Addended by: Burney Gauze R on: 09/28/2021 12:19 PM ? ? Modules accepted: Orders ? ?

## 2021-09-28 NOTE — Telephone Encounter (Signed)
-----   Message from Volanda Napoleon, MD sent at 09/28/2021 12:15 PM EDT ----- ?His iron is low.  Make sure he gets IV iron today. ?

## 2021-09-28 NOTE — Telephone Encounter (Signed)
Pt informed, he will get iron when his pump d/c on 4/26 ?

## 2021-09-28 NOTE — Progress Notes (Signed)
?Hematology and Oncology Follow Up Visit ? ?CHANDLAR GUICE ?[id number] ?1941-01-20 81 y.o. ?09/28/2021 ? ? ?Principle Diagnosis:  ?Stage IIB (T3N0M0) Gastric cancer -- declines surgery ? ?Current Therapy:   ?FOLFOX - s/p cycle #1 --  start on 09/15/2021 ?    ?Interim History:  Mr. Shabazz is back for follow-up.  He seemed to have tolerated first cycle of chemotherapy pretty well.  He does feel little bit fatigued. ? ?He had a little bit of a sore mouth.  I told him to try some water and baking soda.  In addition, Biotene mouth rinse may also work. ? ?His appetite has been okay.  He has had no nausea or vomiting.  He has had no diarrhea.  There has been no bleeding.  He has had no rashes.  There has been no leg swelling. ? ?He does have a PICC line in.  He cannot have a Port-A-Cath in because of a past history of a SVC stricture.  He is doing okay with the PICC line. ? ?Overall, I would have to say that his performance status is probably ECOG 1.   ? ?Medications:  ?Current Outpatient Medications:  ?  acetaminophen (TYLENOL) 650 MG CR tablet, Take 1,300 mg by mouth 2 (two) times daily., Disp: , Rfl:  ?  alum & mag hydroxide-simeth (MAALOX/MYLANTA) 200-200-20 MG/5ML suspension, Take 30 mLs by mouth daily., Disp: , Rfl:  ?  Chlorhexidine Gluconate 2 % PADS, Use to sterilize PICC ports prior to flushing, Disp: 100 each, Rfl: 0 ?  dexamethasone (DECADRON) 4 MG tablet, Take 2 tablets (8 mg total) by mouth daily. Start the day after chemotherapy for 2 days. Take with food., Disp: 30 tablet, Rfl: 1 ?  heparin lock flush 100 UNIT/ML SOLN injection, Dispense 24 syringes with 2.24m dose (discard remainder). Flush each PICC lumen Monday, Wednesday and Friday, Disp: 120 mL, Rfl: 1 ?  lisinopril-hydrochlorothiazide (ZESTORETIC) 20-12.5 MG tablet, Take 1 tablet by mouth daily., Disp: 90 tablet, Rfl: 3 ?  omeprazole (PRILOSEC) 40 MG capsule, Take 1 capsule (40 mg total) by mouth 2 (two) times daily before a meal., Disp: 60  capsule, Rfl: 12 ?  ondansetron (ZOFRAN) 8 MG tablet, Take 1 tablet (8 mg total) by mouth 2 (two) times daily as needed for refractory nausea / vomiting. Start on day 3 after chemotherapy., Disp: 30 tablet, Rfl: 1 ?  polyethylene glycol (MIRALAX / GLYCOLAX) 17 g packet, Take 17 g by mouth daily., Disp: , Rfl:  ?  prochlorperazine (COMPAZINE) 10 MG tablet, Take 1 tablet (10 mg total) by mouth every 6 (six) hours as needed (Nausea or vomiting)., Disp: 30 tablet, Rfl: 1 ?  sodium chloride flush (NS) 0.9 % SOLN, Inject 10 mLs into each port 3 (three) times a week. Flush each port of PICC Monday, Wednesday and Friday., Disp: 240 mL, Rfl: 1 ? ?Allergies:  ?Allergies  ?Allergen Reactions  ? Percocet [Oxycodone-Acetaminophen] Nausea And Vomiting  ? Oxycodone-Aspirin Nausea And Vomiting  ? ? ?Past Medical History, Surgical history, Social history, and Family History were reviewed and updated. ? ?Review of Systems: ?Review of Systems  ?Constitutional:  Positive for fatigue.  ?HENT:  Negative.    ?Eyes: Negative.   ?Respiratory: Negative.    ?Cardiovascular: Negative.   ?Gastrointestinal:  Positive for nausea.  ?Endocrine: Negative.   ?Genitourinary: Negative.    ?Musculoskeletal: Negative.   ?Skin: Negative.   ?Neurological: Negative.   ?Hematological: Negative.   ?Psychiatric/Behavioral: Negative.    ? ?Physical Exam: ?  weight is 155 lb (70.3 kg). His oral temperature is 97.4 ?F (36.3 ?C) (abnormal). His blood pressure is 117/60 and his pulse is 85. His respiration is 20 and oxygen saturation is 100%.  ? ?Wt Readings from Last 3 Encounters:  ?09/28/21 155 lb (70.3 kg)  ?09/15/21 152 lb (68.9 kg)  ?09/15/21 152 lb (68.9 kg)  ? ? ?Physical Exam ?Vitals reviewed.  ?HENT:  ?   Head: Normocephalic and atraumatic.  ?Eyes:  ?   Pupils: Pupils are equal, round, and reactive to light.  ?Cardiovascular:  ?   Rate and Rhythm: Normal rate and regular rhythm.  ?   Heart sounds: Normal heart sounds.  ?Pulmonary:  ?   Effort: Pulmonary  effort is normal.  ?   Breath sounds: Normal breath sounds.  ?Abdominal:  ?   General: Bowel sounds are normal.  ?   Palpations: Abdomen is soft.  ?Musculoskeletal:     ?   General: No tenderness or deformity. Normal range of motion.  ?   Cervical back: Normal range of motion.  ?Lymphadenopathy:  ?   Cervical: No cervical adenopathy.  ?Skin: ?   General: Skin is warm and dry.  ?   Findings: No erythema or rash.  ?Neurological:  ?   Mental Status: He is alert and oriented to person, place, and time.  ?Psychiatric:     ?   Behavior: Behavior normal.     ?   Thought Content: Thought content normal.     ?   Judgment: Judgment normal.  ? ? ? ?Lab Results  ?Component Value Date  ? WBC 8.7 09/28/2021  ? HGB 11.8 (L) 09/28/2021  ? HCT 37.0 (L) 09/28/2021  ? MCV 86.9 09/28/2021  ? PLT 336 09/28/2021  ? ?  Chemistry   ?   ?Component Value Date/Time  ? NA 136 09/28/2021 1003  ? NA 139 07/23/2020 0818  ? K 4.0 09/28/2021 1003  ? CL 101 09/28/2021 1003  ? CO2 27 09/28/2021 1003  ? BUN 23 09/28/2021 1003  ? BUN 10 07/23/2020 0818  ? CREATININE 1.35 (H) 09/28/2021 1003  ? CREATININE 1.06 06/27/2015 0837  ?    ?Component Value Date/Time  ? CALCIUM 9.1 09/28/2021 1003  ? ALKPHOS 86 09/28/2021 1003  ? AST 16 09/28/2021 1003  ? ALT 33 09/28/2021 1003  ? BILITOT 0.3 09/28/2021 1003  ?  ? ? ?Impression and Plan: ?Mr. Mehringer is a very nice 81 year old white male.  He has what I hope to believe localized gastric cancer.  His PET scan certainly was encouraging for not having any obvious advanced or locally advanced disease. ? ?We will go with his second cycle of chemotherapy with FOLFOX.  Again, hopefully he will tolerate this well. ? ?I still plan to give 4 cycles of FOLFOX.  After the fourth cycle we will do a PET scan and see how everything looks.  After that, then we will do Xeloda with radiation therapy.  After that, he will have more systemic therapy, depending on how well he does. ? ?We will plan to get him back in 2 more weeks  for his third cycle of FOLFOX. ? ? ? ?Volanda Napoleon, MD ?4/24/202311:04 AM  ?

## 2021-09-29 ENCOUNTER — Encounter: Payer: Self-pay | Admitting: Hematology & Oncology

## 2021-09-29 NOTE — Progress Notes (Signed)
Oncology Nurse Navigator Documentation ? ? ?  09/28/2021  ? 10:45 AM  ?Oncology Nurse Navigator Flowsheets  ?Navigator Follow Up Date: 10/12/2021  ?Navigator Follow Up Reason: Follow-up Appointment;Chemotherapy  ?Navigator Location [facility]  ?Navigator Encounter Type Treatment;Appt/Treatment Plan Review  ?Patient Visit Type MedOnc  ?Treatment Phase Active Tx  ?Barriers/Navigation Needs Coordination of Care;Education  ?Interventions Psycho-Social Support  ?Acuity Level 2-Minimal Needs (1-2 Barriers Identified)  ?Support Groups/Services Friends and Family  ?Time Spent with Patient 15  ?  ?

## 2021-09-30 ENCOUNTER — Inpatient Hospital Stay: Payer: Medicare HMO

## 2021-09-30 VITALS — BP 113/71 | HR 87 | Temp 98.0°F | Resp 18

## 2021-09-30 DIAGNOSIS — Z5111 Encounter for antineoplastic chemotherapy: Secondary | ICD-10-CM | POA: Diagnosis not present

## 2021-09-30 DIAGNOSIS — C162 Malignant neoplasm of body of stomach: Secondary | ICD-10-CM | POA: Diagnosis not present

## 2021-09-30 DIAGNOSIS — Z79899 Other long term (current) drug therapy: Secondary | ICD-10-CM | POA: Diagnosis not present

## 2021-09-30 DIAGNOSIS — C160 Malignant neoplasm of cardia: Secondary | ICD-10-CM

## 2021-09-30 MED ORDER — SODIUM CHLORIDE 0.9% FLUSH
10.0000 mL | INTRAVENOUS | Status: DC | PRN
  Administered 2021-09-30: 10 mL

## 2021-09-30 MED ORDER — HEPARIN SOD (PORK) LOCK FLUSH 100 UNIT/ML IV SOLN
500.0000 [IU] | Freq: Once | INTRAVENOUS | Status: AC | PRN
  Administered 2021-09-30: 250 [IU]

## 2021-09-30 NOTE — Patient Instructions (Signed)

## 2021-10-07 ENCOUNTER — Inpatient Hospital Stay: Payer: Medicare HMO | Attending: Hematology & Oncology

## 2021-10-07 VITALS — BP 103/61 | HR 85 | Temp 98.3°F | Resp 18

## 2021-10-07 DIAGNOSIS — C162 Malignant neoplasm of body of stomach: Secondary | ICD-10-CM | POA: Insufficient documentation

## 2021-10-07 DIAGNOSIS — Z5111 Encounter for antineoplastic chemotherapy: Secondary | ICD-10-CM | POA: Diagnosis not present

## 2021-10-07 DIAGNOSIS — C160 Malignant neoplasm of cardia: Secondary | ICD-10-CM

## 2021-10-07 DIAGNOSIS — Z95828 Presence of other vascular implants and grafts: Secondary | ICD-10-CM

## 2021-10-07 MED ORDER — HEPARIN SOD (PORK) LOCK FLUSH 100 UNIT/ML IV SOLN
500.0000 [IU] | Freq: Once | INTRAVENOUS | Status: AC
  Administered 2021-10-07: 250 [IU] via INTRAVENOUS

## 2021-10-07 MED ORDER — SODIUM CHLORIDE 0.9% FLUSH
10.0000 mL | Freq: Once | INTRAVENOUS | Status: AC
  Administered 2021-10-07: 10 mL via INTRAVENOUS

## 2021-10-07 NOTE — Patient Instructions (Signed)
PICC Home Care Guide A peripherally inserted central catheter (PICC) is a form of IV access that allows medicines and IV fluids to be quickly put into the blood and spread throughout the body. The PICC is a long, thin, flexible tube (catheter) that is put into a vein in a person's arm or leg. The catheter ends in a large vein just outside the heart called the superior vena cava (SVC). After the PICC is put in, a chest X-ray may be done to make sure that it is in the right place. A PICC may be placed for different reasons, such as: To give medicines and liquid nutrition. To give IV fluids and blood products. To take blood samples often. If there is trouble placing a peripheral intravenous (PIV) catheter. If cared for properly, a PICC can remain in place for many months. Having a PICC can allow you to go home from the hospital sooner and continue treatment at home. Medicines and PICC care can be managed at home by a family member, caregiver, or home health care team. What are the risks? Generally, having a PICC is safe. However, problems may occur, including: A blood clot (thrombus) forming in or at the end of the PICC. A blood clot forming in a vein (deep vein thrombosis) or traveling to the lung (pulmonary embolism). Inflammation of the vein (phlebitis) in which the PICC is placed. Infection at the insertion site or in the blood. Blood infections from central lines, like PICCs, can be serious and often require a hospital stay. PICC malposition, or PICC movement or poor placement. A break or cut in the PICC. Do not use scissors near the PICC. Nerve or tendon irritation or injury during PICC insertion. How to care for your PICC Please follow the specific guidelines provided by your health care provider. Preventing infection You and any caregivers should wash your hands often with soap and water for at least 20 seconds. Wash hands: Before touching the PICC or the infusion device. Before changing a  bandage (dressing). Do not change the dressing unless you have been taught to do so and have shown you are able to change it safely. Flush the PICC as told. Tell your health care provider right away if the PICC is hard to flush or does not flush. Do not use force to flush the PICC. Use clean and germ-free (sterile) supplies only. Keep the supplies in a dry place. Do not reuse needles, syringes, or any other supplies. Reusing supplies can lead to infection. Keep the PICC dressing dry and secure it with tape if the edges stop sticking to your skin. Check your PICC insertion site every day for signs of infection. Check for: Redness, swelling, or pain. Fluid or blood. Warmth. Pus or a bad smell. Preventing other problems Do not use a syringe that is less than 10 mL to flush the PICC. Do not have your blood pressure checked on the arm in which the PICC is placed. Do not ever pull or tug on the PICC. Keep it secured to your arm with tape or a stretch wrap when not in use. Do not take the PICC out yourself. Only a trained health care provider should remove the PICC. Keep pets and children away from your PICC. How to care for your PICC dressing Keep your PICC dressing clean and dry to prevent infection. Do not take baths, swim, or use a hot tub until your health care provider approves. Ask your health care provider if you can take   showers. You may only be allowed to take sponge baths. When you are allowed to shower: Ask your health care provider to teach you how to wrap the PICC. Cover the PICC with clear plastic wrap and tape to keep it dry while showering. Follow instructions from your health care provider about how to take care of your insertion site and dressing. Make sure you: Wash your hands with soap and water for at least 20 seconds before and after you change your dressing. If soap and water are not available, use hand sanitizer. Change your dressing only if taught to do so by your health care  provider. Your PICC dressing needs to be changed if it becomes loose or wet. Leave stitches (sutures), skin glue, or adhesive strips in place. These skin closures may need to stay in place for 2 weeks or longer. If adhesive strip edges start to loosen and curl up, you may trim the loose edges. Do not remove adhesive strips completely unless your health care provider tells you to do that. Follow these instructions at home: Disposal of supplies Throw away any syringes in a disposal container that is meant for sharp items (sharps container). You can buy a sharps container from a pharmacy, or you can make one by using an empty, hard plastic bottle with a lid. Place any used dressings or infusion bags into a plastic bag. Throw that bag in the trash. General instructions  Always carry your PICC identification card or wear a medical alert bracelet. Keep the tube clamped at all times, unless it is being used. Always carry a smooth-edge clamp with you to clamp the PICC if it breaks. Do not use scissors or sharp objects near the tube. You may bend your arm and move it freely. If your PICC is near or at the bend of your elbow, avoid activity with repeated motion at the elbow. Avoid lifting heavy objects as told by your health care provider. Keep all follow-up visits. This is important. You will need to have your PICC dressing changed at least once a week. Contact a health care provider if: You have pain in your arm, ear, face, or teeth. You have a fever or chills. You have redness, swelling, or pain around the insertion site. You have fluid or blood coming from the insertion site. Your insertion site feels warm to the touch. You have pus or a bad smell coming from the insertion site. Your skin feels hard and raised around the insertion site. Your PICC dressing has gotten wet or is coming off and you have not been taught how to change it. Get help right away if: You have problems with your PICC, such as  your PICC: Was tugged or pulled and has partially come out. Do not  push the PICC back in. Cannot be flushed, is hard to flush, or leaks around the insertion site when it is flushed. Makes a flushing sound when it is flushed. Appears to have a hole or tear. Is accidentally pulled all the way out. If this happens, cover the insertion site with a gauze dressing. Do not throw the PICC away. Your health care provider will need to check it to be sure the entire catheter came out. You feel your heart racing or skipping beats, or you have chest pain. You have shortness of breath or trouble breathing. You have swelling, redness, warmth, or pain in the arm in which the PICC is placed. You have a red streak going up your arm that   starts under the PICC dressing. These symptoms may be an emergency. Get help right away. Call 911. Do not wait to see if the symptoms will go away. Do not drive yourself to the hospital. Summary A peripherally inserted central catheter (PICC) is a long, thin, flexible tube (catheter) that is put into a vein in the arm or leg. If cared for properly, a PICC can remain in place for many months. Having a PICC can allow you to go home from the hospital sooner and continue treatment at home. The PICC is inserted using a germ-free (sterile) technique by a specially trained health care provider. Only a trained health care provider should remove it. Do not have your blood pressure checked on the arm in which your PICC is placed. Always keep your PICC identification card with you. This information is not intended to replace advice given to you by your health care provider. Make sure you discuss any questions you have with your health care provider. Document Revised: 12/10/2020 Document Reviewed: 12/10/2020 Elsevier Patient Education  2023 [employer]  

## 2021-10-08 DIAGNOSIS — E039 Hypothyroidism, unspecified: Secondary | ICD-10-CM | POA: Diagnosis not present

## 2021-10-08 DIAGNOSIS — I10 Essential (primary) hypertension: Secondary | ICD-10-CM | POA: Diagnosis not present

## 2021-10-08 DIAGNOSIS — G2581 Restless legs syndrome: Secondary | ICD-10-CM | POA: Insufficient documentation

## 2021-10-08 DIAGNOSIS — H6123 Impacted cerumen, bilateral: Secondary | ICD-10-CM | POA: Diagnosis not present

## 2021-10-08 DIAGNOSIS — K219 Gastro-esophageal reflux disease without esophagitis: Secondary | ICD-10-CM | POA: Diagnosis not present

## 2021-10-08 DIAGNOSIS — C169 Malignant neoplasm of stomach, unspecified: Secondary | ICD-10-CM | POA: Diagnosis not present

## 2021-10-08 DIAGNOSIS — Z0001 Encounter for general adult medical examination with abnormal findings: Secondary | ICD-10-CM | POA: Diagnosis not present

## 2021-10-09 ENCOUNTER — Inpatient Hospital Stay: Payer: Medicare HMO

## 2021-10-09 VITALS — BP 110/63 | HR 87 | Temp 98.3°F | Resp 17

## 2021-10-09 DIAGNOSIS — C162 Malignant neoplasm of body of stomach: Secondary | ICD-10-CM | POA: Diagnosis not present

## 2021-10-09 DIAGNOSIS — Z5111 Encounter for antineoplastic chemotherapy: Secondary | ICD-10-CM | POA: Diagnosis not present

## 2021-10-09 DIAGNOSIS — Z452 Encounter for adjustment and management of vascular access device: Secondary | ICD-10-CM

## 2021-10-09 MED ORDER — HEPARIN SOD (PORK) LOCK FLUSH 100 UNIT/ML IV SOLN
250.0000 [IU] | INTRAVENOUS | Status: AC | PRN
  Administered 2021-10-09: 250 [IU]

## 2021-10-09 MED ORDER — SODIUM CHLORIDE 0.9% FLUSH
10.0000 mL | INTRAVENOUS | Status: AC | PRN
  Administered 2021-10-09: 10 mL

## 2021-10-12 ENCOUNTER — Inpatient Hospital Stay: Payer: Medicare HMO

## 2021-10-12 ENCOUNTER — Encounter: Payer: Self-pay | Admitting: *Deleted

## 2021-10-12 ENCOUNTER — Encounter: Payer: Self-pay | Admitting: Hematology & Oncology

## 2021-10-12 ENCOUNTER — Inpatient Hospital Stay (HOSPITAL_BASED_OUTPATIENT_CLINIC_OR_DEPARTMENT_OTHER): Payer: Medicare HMO | Admitting: Hematology & Oncology

## 2021-10-12 ENCOUNTER — Other Ambulatory Visit: Payer: Self-pay

## 2021-10-12 ENCOUNTER — Other Ambulatory Visit: Payer: Self-pay | Admitting: Family

## 2021-10-12 VITALS — BP 129/78 | HR 88 | Temp 98.0°F | Resp 20 | Wt 156.8 lb

## 2021-10-12 DIAGNOSIS — C160 Malignant neoplasm of cardia: Secondary | ICD-10-CM | POA: Diagnosis not present

## 2021-10-12 DIAGNOSIS — Z5111 Encounter for antineoplastic chemotherapy: Secondary | ICD-10-CM | POA: Diagnosis not present

## 2021-10-12 DIAGNOSIS — C162 Malignant neoplasm of body of stomach: Secondary | ICD-10-CM | POA: Diagnosis not present

## 2021-10-12 LAB — CBC WITH DIFFERENTIAL (CANCER CENTER ONLY)
Abs Immature Granulocytes: 0.05 10*3/uL (ref 0.00–0.07)
Basophils Absolute: 0.1 10*3/uL (ref 0.0–0.1)
Basophils Relative: 1 %
Eosinophils Absolute: 0.2 10*3/uL (ref 0.0–0.5)
Eosinophils Relative: 2 %
HCT: 35.5 % — ABNORMAL LOW (ref 39.0–52.0)
Hemoglobin: 11.5 g/dL — ABNORMAL LOW (ref 13.0–17.0)
Immature Granulocytes: 1 %
Lymphocytes Relative: 20 %
Lymphs Abs: 1.3 10*3/uL (ref 0.7–4.0)
MCH: 28.8 pg (ref 26.0–34.0)
MCHC: 32.4 g/dL (ref 30.0–36.0)
MCV: 89 fL (ref 80.0–100.0)
Monocytes Absolute: 1 10*3/uL (ref 0.1–1.0)
Monocytes Relative: 15 %
Neutro Abs: 3.9 10*3/uL (ref 1.7–7.7)
Neutrophils Relative %: 61 %
Platelet Count: 277 10*3/uL (ref 150–400)
RBC: 3.99 MIL/uL — ABNORMAL LOW (ref 4.22–5.81)
RDW: 15.6 % — ABNORMAL HIGH (ref 11.5–15.5)
WBC Count: 6.5 10*3/uL (ref 4.0–10.5)
nRBC: 0 % (ref 0.0–0.2)

## 2021-10-12 LAB — CMP (CANCER CENTER ONLY)
ALT: 36 U/L (ref 0–44)
AST: 18 U/L (ref 15–41)
Albumin: 3.8 g/dL (ref 3.5–5.0)
Alkaline Phosphatase: 82 U/L (ref 38–126)
Anion gap: 6 (ref 5–15)
BUN: 19 mg/dL (ref 8–23)
CO2: 27 mmol/L (ref 22–32)
Calcium: 9.1 mg/dL (ref 8.9–10.3)
Chloride: 103 mmol/L (ref 98–111)
Creatinine: 1.35 mg/dL — ABNORMAL HIGH (ref 0.61–1.24)
GFR, Estimated: 53 mL/min — ABNORMAL LOW (ref >60)
Glucose, Bld: 110 mg/dL — ABNORMAL HIGH (ref 70–99)
Potassium: 4.3 mmol/L (ref 3.5–5.1)
Sodium: 136 mmol/L (ref 135–145)
Total Bilirubin: 0.3 mg/dL (ref 0.3–1.2)
Total Protein: 6.1 g/dL — ABNORMAL LOW (ref 6.5–8.1)

## 2021-10-12 LAB — LACTATE DEHYDROGENASE: LDH: 109 U/L (ref 98–192)

## 2021-10-12 MED ORDER — FLUOROURACIL CHEMO INJECTION 2.5 GM/50ML
360.0000 mg/m2 | Freq: Once | INTRAVENOUS | Status: AC
  Administered 2021-10-12: 650 mg via INTRAVENOUS
  Filled 2021-10-12: qty 13

## 2021-10-12 MED ORDER — HYDROCODONE-ACETAMINOPHEN 5-325 MG PO TABS
1.0000 | ORAL_TABLET | Freq: Once | ORAL | Status: AC
  Administered 2021-10-12: 1 via ORAL
  Filled 2021-10-12: qty 1

## 2021-10-12 MED ORDER — PALONOSETRON HCL INJECTION 0.25 MG/5ML
0.2500 mg | Freq: Once | INTRAVENOUS | Status: AC
  Administered 2021-10-12: 0.25 mg via INTRAVENOUS
  Filled 2021-10-12: qty 5

## 2021-10-12 MED ORDER — LEUCOVORIN CALCIUM INJECTION 350 MG
400.0000 mg/m2 | Freq: Once | INTRAVENOUS | Status: AC
  Administered 2021-10-12: 728 mg via INTRAVENOUS
  Filled 2021-10-12: qty 36.4

## 2021-10-12 MED ORDER — SODIUM CHLORIDE 0.9 % IV SOLN
10.0000 mg | Freq: Once | INTRAVENOUS | Status: AC
  Administered 2021-10-12: 10 mg via INTRAVENOUS
  Filled 2021-10-12: qty 10

## 2021-10-12 MED ORDER — SODIUM CHLORIDE 0.9 % IV SOLN
1920.0000 mg/m2 | INTRAVENOUS | Status: DC
  Administered 2021-10-12: 3500 mg via INTRAVENOUS
  Filled 2021-10-12: qty 70

## 2021-10-12 MED ORDER — DEXTROSE 5 % IV SOLN: Freq: Once | INTRAVENOUS | Status: DC

## 2021-10-12 MED ORDER — HEPARIN SOD (PORK) LOCK FLUSH 100 UNIT/ML IV SOLN: 500.0000 [IU] | Freq: Once | INTRAVENOUS | Status: DC | PRN

## 2021-10-12 MED ORDER — HYDROCODONE-ACETAMINOPHEN 5-325 MG PO TABS: 1.0000 | ORAL_TABLET | Freq: Once | ORAL | Status: DC

## 2021-10-12 MED ORDER — SODIUM CHLORIDE 0.9% FLUSH: 10.0000 mL | INTRAVENOUS | Status: DC | PRN

## 2021-10-12 MED ORDER — DEXTROSE 5 % IV SOLN: Freq: Once | INTRAVENOUS | Status: AC

## 2021-10-12 MED ORDER — OXALIPLATIN CHEMO INJECTION 100 MG/20ML
76.5000 mg/m2 | Freq: Once | INTRAVENOUS | Status: AC
  Administered 2021-10-12: 140 mg via INTRAVENOUS
  Filled 2021-10-12: qty 28

## 2021-10-12 NOTE — Patient Instructions (Signed)
[facility]  Discharge Instructions: ?Thank you for choosing [facility] to provide your oncology and hematology care.  ? ?If you have a lab appointment with the [facility], please go directly to the [facility] and check in at the registration area. ? ?Wear comfortable clothing and clothing appropriate for easy access to any Portacath or PICC line.  ? ?We strive to give you quality time with your provider. You may need to reschedule your appointment if you arrive late (15 or more minutes).  Arriving late affects you and other patients whose appointments are after yours.  Also, if you miss three or more appointments without notifying the office, you may be dismissed from the clinic at the provider?s discretion.    ?  ?For prescription refill requests, have your pharmacy contact our office and allow 72 hours for refills to be completed.   ? ?Today you received the following chemotherapy and/or immunotherapy agents Oxaliplatin, Leucovorin, 5FU.    ?  ?To help prevent nausea and vomiting after your treatment, we encourage you to take your nausea medication as directed. ? ?BELOW ARE SYMPTOMS THAT SHOULD BE REPORTED IMMEDIATELY: ?*FEVER GREATER THAN 100.4 F (38 ?C) OR HIGHER ?*CHILLS OR SWEATING ?*NAUSEA AND VOMITING THAT IS NOT CONTROLLED WITH YOUR NAUSEA MEDICATION ?*UNUSUAL SHORTNESS OF BREATH ?*UNUSUAL BRUISING OR BLEEDING ?*URINARY PROBLEMS (pain or burning when urinating, or frequent urination) ?*BOWEL PROBLEMS (unusual diarrhea, constipation, pain near the anus) ?TENDERNESS IN MOUTH AND THROAT WITH OR WITHOUT PRESENCE OF ULCERS (sore throat, sores in mouth, or a toothache) ?UNUSUAL RASH, SWELLING OR PAIN  ?UNUSUAL VAGINAL DISCHARGE OR ITCHING  ? ?Items with * indicate a potential emergency and should be followed up as soon as possible or go to the Emergency Department if any problems should occur. ? ?Please show the CHEMOTHERAPY ALERT CARD or IMMUNOTHERAPY ALERT CARD  at check-in to the Emergency Department and triage nurse. ?Should you have questions after your visit or need to cancel or reschedule your appointment, please contact [facility]  [phone] and follow the prompts.  Office hours are 8:00 a.m. to 4:30 p.m. Monday - Friday. Please note that voicemails left after 4:00 p.m. may not be returned until the following business day.  We are closed weekends and major holidays. You have access to a nurse at all times for urgent questions. Please call the main number to the clinic [phone] and follow the prompts. ? ?For any non-urgent questions, you may also contact your provider using MyChart. We now offer e-Visits for anyone 5 and older to request care online for non-urgent symptoms. For details visit [email or web address]. ?  ?Also download the MyChart app! Go to the app store, search "MyChart", open the app, select [facility], and log in with your MyChart username and password. ? ?Due to Covid, a mask is required upon entering the hospital/clinic. If you do not have a mask, one will be given to you upon arrival. For doctor visits, patients may have 1 support person aged 68 or older with them. For treatment visits, patients cannot have anyone with them due to current Covid guidelines and our immunocompromised population.  ?

## 2021-10-12 NOTE — Progress Notes (Signed)
Patient doing very well with treatment. Will need PET after next cycle.  ? ?Oncology Nurse Navigator Documentation ? ? ?  10/12/2021  ? 12:45 PM  ?Oncology Nurse Navigator Flowsheets  ?Navigator Follow Up Date: 10/26/2021  ?Navigator Follow Up Reason: Follow-up Appointment;Chemotherapy  ?Navigator Location [facility]  ?Navigator Encounter Type Appt/Treatment Plan Review  ?Patient Visit Type MedOnc  ?Treatment Phase Active Tx  ?Barriers/Navigation Needs Coordination of Care;Education  ?Interventions None Required  ?Acuity Level 2-Minimal Needs (1-2 Barriers Identified)  ?Support Groups/Services Friends and Family  ?Time Spent with Patient 15  ?  ?

## 2021-10-12 NOTE — Progress Notes (Signed)
Cbc and cmet reviewed by MD, ok to treat despite counts  ?

## 2021-10-12 NOTE — Progress Notes (Signed)
?Hematology and Oncology Follow Up Visit ? ?George Dyer ?[id number] ?Mar 12, 1941 81 y.o. ?10/12/2021 ? ? ?Principle Diagnosis:  ?Stage IIB (T3N0M0) Gastric cancer -- declines surgery ? ?Current Therapy:   ?FOLFOX - s/p cycle #2 --  start on 09/15/2021 ?    ?Interim History:  [name] is back for follow-up.  So far, he is doing okay.  He does get tired.  He has had no problems with the chemotherapy itself.  He did not have any mouth sores the last time he took treatment.  He did not have diarrhea.  His weight is coming up a little bit.  He is eating okay.  There is no nausea or vomiting.  He has had no bleeding.  Is no melena or hematochezia. ? ?He has had no fever. ? ?There has been no leg swelling. ? ?Overall, I would say his performance status is probably ECOG 0.   ? ?Medications:  ?Current Outpatient Medications:  ?  acetaminophen (TYLENOL) 650 MG CR tablet, Take 1,300 mg by mouth 2 (two) times daily., Disp: , Rfl:  ?  alum & mag hydroxide-simeth (MAALOX/MYLANTA) 200-200-20 MG/5ML suspension, Take 30 mLs by mouth daily., Disp: , Rfl:  ?  Chlorhexidine Gluconate 2 % PADS, Use to sterilize PICC ports prior to flushing, Disp: 100 each, Rfl: 0 ?  dexamethasone (DECADRON) 4 MG tablet, Take 2 tablets (8 mg total) by mouth daily. Start the day after chemotherapy for 2 days. Take with food., Disp: 30 tablet, Rfl: 1 ?  heparin lock flush 100 UNIT/ML SOLN injection, Dispense 24 syringes with 2.50m dose (discard remainder). Flush each PICC lumen Monday, Wednesday and Friday, Disp: 120 mL, Rfl: 1 ?  lisinopril-hydrochlorothiazide (ZESTORETIC) 20-12.5 MG tablet, Take 1 tablet by mouth daily., Disp: 90 tablet, Rfl: 3 ?  omeprazole (PRILOSEC) 40 MG capsule, Take 1 capsule (40 mg total) by mouth 2 (two) times daily before a meal., Disp: 60 capsule, Rfl: 12 ?  polyethylene glycol (MIRALAX / GLYCOLAX) 17 g packet, Take 17 g by mouth daily., Disp: , Rfl:  ?  rOPINIRole (REQUIP) 2 MG tablet, Take 2 mg by mouth at bedtime.,  Disp: , Rfl:  ?  sodium chloride flush (NS) 0.9 % SOLN, Inject 10 mLs into each port 3 (three) times a week. Flush each port of PICC Monday, Wednesday and Friday., Disp: 240 mL, Rfl: 1 ?  levothyroxine (SYNTHROID) 50 MCG tablet, Take 50 mcg by mouth daily., Disp: , Rfl:  ?  ondansetron (ZOFRAN) 8 MG tablet, Take 1 tablet (8 mg total) by mouth 2 (two) times daily as needed for refractory nausea / vomiting. Start on day 3 after chemotherapy. (Patient not taking: Reported on 10/12/2021), Disp: 30 tablet, Rfl: 1 ?  prochlorperazine (COMPAZINE) 10 MG tablet, Take 1 tablet (10 mg total) by mouth every 6 (six) hours as needed (Nausea or vomiting). (Patient not taking: Reported on 10/12/2021), Disp: 30 tablet, Rfl: 1 ? ?Allergies:  ?Allergies  ?Allergen Reactions  ? Percocet [Oxycodone-Acetaminophen] Nausea And Vomiting  ? Oxycodone-Aspirin Nausea And Vomiting  ? ? ?Past Medical History, Surgical history, Social history, and Family History were reviewed and updated. ? ?Review of Systems: ?Review of Systems  ?Constitutional:  Positive for fatigue.  ?HENT:  Negative.    ?Eyes: Negative.   ?Respiratory: Negative.    ?Cardiovascular: Negative.   ?Gastrointestinal:  Positive for nausea.  ?Endocrine: Negative.   ?Genitourinary: Negative.    ?Musculoskeletal: Negative.   ?Skin: Negative.   ?Neurological: Negative.   ?Hematological: Negative.   ?  Psychiatric/Behavioral: Negative.    ? ?Physical Exam: ? weight is 156 lb 12.8 oz (71.1 kg). His oral temperature is 98 ?F (36.7 ?C). His blood pressure is 129/78 and his pulse is 88. His respiration is 20 and oxygen saturation is 98%.  ? ?Wt Readings from Last 3 Encounters:  ?10/12/21 156 lb 12.8 oz (71.1 kg)  ?09/28/21 155 lb (70.3 kg)  ?09/15/21 152 lb (68.9 kg)  ? ? ?Physical Exam ?Vitals reviewed.  ?HENT:  ?   Head: Normocephalic and atraumatic.  ?Eyes:  ?   Pupils: Pupils are equal, round, and reactive to light.  ?Cardiovascular:  ?   Rate and Rhythm: Normal rate and regular rhythm.  ?    Heart sounds: Normal heart sounds.  ?Pulmonary:  ?   Effort: Pulmonary effort is normal.  ?   Breath sounds: Normal breath sounds.  ?Abdominal:  ?   General: Bowel sounds are normal.  ?   Palpations: Abdomen is soft.  ?Musculoskeletal:     ?   General: No tenderness or deformity. Normal range of motion.  ?   Cervical back: Normal range of motion.  ?Lymphadenopathy:  ?   Cervical: No cervical adenopathy.  ?Skin: ?   General: Skin is warm and dry.  ?   Findings: No erythema or rash.  ?Neurological:  ?   Mental Status: He is alert and oriented to person, place, and time.  ?Psychiatric:     ?   Behavior: Behavior normal.     ?   Thought Content: Thought content normal.     ?   Judgment: Judgment normal.  ? ? ? ?Lab Results  ?Component Value Date  ? WBC 6.5 10/12/2021  ? HGB 11.5 (L) 10/12/2021  ? HCT 35.5 (L) 10/12/2021  ? MCV 89.0 10/12/2021  ? PLT 277 10/12/2021  ? ?  Chemistry   ?   ?Component Value Date/Time  ? NA 136 10/12/2021 1006  ? NA 139 07/23/2020 0818  ? K 4.3 10/12/2021 1006  ? CL 103 10/12/2021 1006  ? CO2 27 10/12/2021 1006  ? BUN 19 10/12/2021 1006  ? BUN 10 07/23/2020 0818  ? CREATININE 1.35 (H) 10/12/2021 1006  ? CREATININE 1.06 06/27/2015 0837  ?    ?Component Value Date/Time  ? CALCIUM 9.1 10/12/2021 1006  ? ALKPHOS 82 10/12/2021 1006  ? AST 18 10/12/2021 1006  ? ALT 36 10/12/2021 1006  ? BILITOT 0.3 10/12/2021 1006  ?  ? ? ?Impression and Plan: ?[name] is a very nice 81 year old white male.  He has what I hope to believe localized gastric cancer.  His PET scan certainly was encouraging for not having any obvious advanced or locally advanced disease. ? ?He does not want surgery. ? ?We will go ahead with his third cycle of treatment.  After the fourth cycle, we will repeat the PET scan and see how everything looks.  We will then see about chemotherapy and radiation therapy. ? ?I will plan to get him back in 2 more weeks. ? ? ? ?Volanda Napoleon, MD ?5/8/202311:33 AM  ?

## 2021-10-12 NOTE — Patient Instructions (Signed)
PICC Home Care Guide A peripherally inserted central catheter (PICC) is a form of IV access that allows medicines and IV fluids to be quickly put into the blood and spread throughout the body. The PICC is a long, thin, flexible tube (catheter) that is put into a vein in a person's arm or leg. The catheter ends in a large vein just outside the heart called the superior vena cava (SVC). After the PICC is put in, a chest X-ray may be done to make sure that it is in the right place. A PICC may be placed for different reasons, such as: To give medicines and liquid nutrition. To give IV fluids and blood products. To take blood samples often. If there is trouble placing a peripheral intravenous (PIV) catheter. If cared for properly, a PICC can remain in place for many months. Having a PICC can allow you to go home from the hospital sooner and continue treatment at home. Medicines and PICC care can be managed at home by a family member, caregiver, or home health care team. What are the risks? Generally, having a PICC is safe. However, problems may occur, including: A blood clot (thrombus) forming in or at the end of the PICC. A blood clot forming in a vein (deep vein thrombosis) or traveling to the lung (pulmonary embolism). Inflammation of the vein (phlebitis) in which the PICC is placed. Infection at the insertion site or in the blood. Blood infections from central lines, like PICCs, can be serious and often require a hospital stay. PICC malposition, or PICC movement or poor placement. A break or cut in the PICC. Do not use scissors near the PICC. Nerve or tendon irritation or injury during PICC insertion. How to care for your PICC Please follow the specific guidelines provided by your health care provider. Preventing infection You and any caregivers should wash your hands often with soap and water for at least 20 seconds. Wash hands: Before touching the PICC or the infusion device. Before changing a  bandage (dressing). Do not change the dressing unless you have been taught to do so and have shown you are able to change it safely. Flush the PICC as told. Tell your health care provider right away if the PICC is hard to flush or does not flush. Do not use force to flush the PICC. Use clean and germ-free (sterile) supplies only. Keep the supplies in a dry place. Do not reuse needles, syringes, or any other supplies. Reusing supplies can lead to infection. Keep the PICC dressing dry and secure it with tape if the edges stop sticking to your skin. Check your PICC insertion site every day for signs of infection. Check for: Redness, swelling, or pain. Fluid or blood. Warmth. Pus or a bad smell. Preventing other problems Do not use a syringe that is less than 10 mL to flush the PICC. Do not have your blood pressure checked on the arm in which the PICC is placed. Do not ever pull or tug on the PICC. Keep it secured to your arm with tape or a stretch wrap when not in use. Do not take the PICC out yourself. Only a trained health care provider should remove the PICC. Keep pets and children away from your PICC. How to care for your PICC dressing Keep your PICC dressing clean and dry to prevent infection. Do not take baths, swim, or use a hot tub until your health care provider approves. Ask your health care provider if you can take   showers. You may only be allowed to take sponge baths. When you are allowed to shower: Ask your health care provider to teach you how to wrap the PICC. Cover the PICC with clear plastic wrap and tape to keep it dry while showering. Follow instructions from your health care provider about how to take care of your insertion site and dressing. Make sure you: Wash your hands with soap and water for at least 20 seconds before and after you change your dressing. If soap and water are not available, use hand sanitizer. Change your dressing only if taught to do so by your health care  provider. Your PICC dressing needs to be changed if it becomes loose or wet. Leave stitches (sutures), skin glue, or adhesive strips in place. These skin closures may need to stay in place for 2 weeks or longer. If adhesive strip edges start to loosen and curl up, you may trim the loose edges. Do not remove adhesive strips completely unless your health care provider tells you to do that. Follow these instructions at home: Disposal of supplies Throw away any syringes in a disposal container that is meant for sharp items (sharps container). You can buy a sharps container from a pharmacy, or you can make one by using an empty, hard plastic bottle with a lid. Place any used dressings or infusion bags into a plastic bag. Throw that bag in the trash. General instructions  Always carry your PICC identification card or wear a medical alert bracelet. Keep the tube clamped at all times, unless it is being used. Always carry a smooth-edge clamp with you to clamp the PICC if it breaks. Do not use scissors or sharp objects near the tube. You may bend your arm and move it freely. If your PICC is near or at the bend of your elbow, avoid activity with repeated motion at the elbow. Avoid lifting heavy objects as told by your health care provider. Keep all follow-up visits. This is important. You will need to have your PICC dressing changed at least once a week. Contact a health care provider if: You have pain in your arm, ear, face, or teeth. You have a fever or chills. You have redness, swelling, or pain around the insertion site. You have fluid or blood coming from the insertion site. Your insertion site feels warm to the touch. You have pus or a bad smell coming from the insertion site. Your skin feels hard and raised around the insertion site. Your PICC dressing has gotten wet or is coming off and you have not been taught how to change it. Get help right away if: You have problems with your PICC, such as  your PICC: Was tugged or pulled and has partially come out. Do not  push the PICC back in. Cannot be flushed, is hard to flush, or leaks around the insertion site when it is flushed. Makes a flushing sound when it is flushed. Appears to have a hole or tear. Is accidentally pulled all the way out. If this happens, cover the insertion site with a gauze dressing. Do not throw the PICC away. Your health care provider will need to check it to be sure the entire catheter came out. You feel your heart racing or skipping beats, or you have chest pain. You have shortness of breath or trouble breathing. You have swelling, redness, warmth, or pain in the arm in which the PICC is placed. You have a red streak going up your arm that   starts under the PICC dressing. These symptoms may be an emergency. Get help right away. Call 911. Do not wait to see if the symptoms will go away. Do not drive yourself to the hospital. Summary A peripherally inserted central catheter (PICC) is a long, thin, flexible tube (catheter) that is put into a vein in the arm or leg. If cared for properly, a PICC can remain in place for many months. Having a PICC can allow you to go home from the hospital sooner and continue treatment at home. The PICC is inserted using a germ-free (sterile) technique by a specially trained health care provider. Only a trained health care provider should remove it. Do not have your blood pressure checked on the arm in which your PICC is placed. Always keep your PICC identification card with you. This information is not intended to replace advice given to you by your health care provider. Make sure you discuss any questions you have with your health care provider. Document Revised: 12/10/2020 Document Reviewed: 12/10/2020 Elsevier Patient Education  2023 [employer]  

## 2021-10-13 ENCOUNTER — Encounter: Payer: Self-pay | Admitting: Hematology & Oncology

## 2021-10-14 ENCOUNTER — Encounter: Payer: Self-pay | Admitting: Hematology & Oncology

## 2021-10-14 ENCOUNTER — Inpatient Hospital Stay: Payer: Medicare HMO

## 2021-10-14 DIAGNOSIS — Z95828 Presence of other vascular implants and grafts: Secondary | ICD-10-CM

## 2021-10-14 DIAGNOSIS — C160 Malignant neoplasm of cardia: Secondary | ICD-10-CM

## 2021-10-14 DIAGNOSIS — C162 Malignant neoplasm of body of stomach: Secondary | ICD-10-CM | POA: Diagnosis not present

## 2021-10-14 DIAGNOSIS — Z5111 Encounter for antineoplastic chemotherapy: Secondary | ICD-10-CM | POA: Diagnosis not present

## 2021-10-14 MED ORDER — HEPARIN SOD (PORK) LOCK FLUSH 100 UNIT/ML IV SOLN: 500.0000 [IU] | Freq: Once | INTRAVENOUS | Status: DC | PRN

## 2021-10-14 MED ORDER — HEPARIN SOD (PORK) LOCK FLUSH 100 UNIT/ML IV SOLN
500.0000 [IU] | Freq: Once | INTRAVENOUS | Status: AC
  Administered 2021-10-14: 500 [IU] via INTRAVENOUS

## 2021-10-14 MED ORDER — SODIUM CHLORIDE 0.9% FLUSH
10.0000 mL | Freq: Once | INTRAVENOUS | Status: AC
  Administered 2021-10-14: 10 mL via INTRAVENOUS

## 2021-10-14 MED ORDER — SODIUM CHLORIDE 0.9% FLUSH: 10.0000 mL | INTRAVENOUS | Status: DC | PRN

## 2021-10-14 NOTE — Patient Instructions (Signed)

## 2021-10-21 ENCOUNTER — Inpatient Hospital Stay: Payer: Medicare HMO

## 2021-10-21 VITALS — BP 121/69 | HR 76 | Temp 98.1°F

## 2021-10-21 DIAGNOSIS — Z452 Encounter for adjustment and management of vascular access device: Secondary | ICD-10-CM

## 2021-10-21 DIAGNOSIS — C162 Malignant neoplasm of body of stomach: Secondary | ICD-10-CM | POA: Diagnosis not present

## 2021-10-21 DIAGNOSIS — Z5111 Encounter for antineoplastic chemotherapy: Secondary | ICD-10-CM | POA: Diagnosis not present

## 2021-10-21 MED ORDER — SODIUM CHLORIDE 0.9% FLUSH: 10.0000 mL | INTRAVENOUS | Status: DC | PRN

## 2021-10-21 MED ORDER — HEPARIN SOD (PORK) LOCK FLUSH 100 UNIT/ML IV SOLN
500.0000 [IU] | INTRAVENOUS | Status: AC | PRN
  Administered 2021-10-21: 250 [IU]

## 2021-10-21 MED ORDER — HEPARIN SOD (PORK) LOCK FLUSH 100 UNIT/ML IV SOLN
250.0000 [IU] | INTRAVENOUS | Status: AC | PRN
  Administered 2021-10-21: 250 [IU]

## 2021-10-21 MED ORDER — SODIUM CHLORIDE 0.9% FLUSH
10.0000 mL | INTRAVENOUS | Status: AC | PRN
  Administered 2021-10-21: 10 mL

## 2021-10-21 MED ORDER — ANTICOAGULANT SODIUM CITRATE LOCK FLUSH 4% (120 MG/3ML) IV SOLN: 5.0000 mL | PREFILLED_SYRINGE | INTRAVENOUS | Status: DC | PRN

## 2021-10-23 ENCOUNTER — Other Ambulatory Visit (HOSPITAL_BASED_OUTPATIENT_CLINIC_OR_DEPARTMENT_OTHER): Payer: Self-pay

## 2021-10-23 MED ORDER — HEPARIN NA (PORK) LOCK FLSH PF 100 UNIT/ML IV SOLN
INTRAVENOUS | 1 refills | Status: DC
  Filled 2021-10-23 – 2021-10-26 (×2): qty 120, 56d supply, fill #0

## 2021-10-26 ENCOUNTER — Inpatient Hospital Stay: Payer: Medicare HMO

## 2021-10-26 ENCOUNTER — Encounter: Payer: Self-pay | Admitting: Hematology & Oncology

## 2021-10-26 ENCOUNTER — Other Ambulatory Visit (HOSPITAL_BASED_OUTPATIENT_CLINIC_OR_DEPARTMENT_OTHER): Payer: Self-pay

## 2021-10-26 ENCOUNTER — Encounter: Payer: Self-pay | Admitting: *Deleted

## 2021-10-26 ENCOUNTER — Other Ambulatory Visit: Payer: Self-pay

## 2021-10-26 ENCOUNTER — Inpatient Hospital Stay (HOSPITAL_BASED_OUTPATIENT_CLINIC_OR_DEPARTMENT_OTHER): Payer: Medicare HMO | Admitting: Hematology & Oncology

## 2021-10-26 VITALS — BP 137/70 | HR 88 | Temp 97.8°F | Resp 18

## 2021-10-26 VITALS — BP 137/70 | HR 88 | Temp 97.8°F | Resp 18 | Ht 69.0 in | Wt 158.0 lb

## 2021-10-26 DIAGNOSIS — Z452 Encounter for adjustment and management of vascular access device: Secondary | ICD-10-CM

## 2021-10-26 DIAGNOSIS — Z5111 Encounter for antineoplastic chemotherapy: Secondary | ICD-10-CM | POA: Diagnosis not present

## 2021-10-26 DIAGNOSIS — C160 Malignant neoplasm of cardia: Secondary | ICD-10-CM | POA: Diagnosis not present

## 2021-10-26 DIAGNOSIS — C162 Malignant neoplasm of body of stomach: Secondary | ICD-10-CM | POA: Diagnosis not present

## 2021-10-26 LAB — CBC WITH DIFFERENTIAL (CANCER CENTER ONLY)
Abs Immature Granulocytes: 0.03 10*3/uL (ref 0.00–0.07)
Basophils Absolute: 0.1 10*3/uL (ref 0.0–0.1)
Basophils Relative: 1 %
Eosinophils Absolute: 0.2 10*3/uL (ref 0.0–0.5)
Eosinophils Relative: 3 %
HCT: 36.2 % — ABNORMAL LOW (ref 39.0–52.0)
Hemoglobin: 11.6 g/dL — ABNORMAL LOW (ref 13.0–17.0)
Immature Granulocytes: 1 %
Lymphocytes Relative: 25 %
Lymphs Abs: 1.1 10*3/uL (ref 0.7–4.0)
MCH: 29.1 pg (ref 26.0–34.0)
MCHC: 32 g/dL (ref 30.0–36.0)
MCV: 90.7 fL (ref 80.0–100.0)
Monocytes Absolute: 0.7 10*3/uL (ref 0.1–1.0)
Monocytes Relative: 15 %
Neutro Abs: 2.6 10*3/uL (ref 1.7–7.7)
Neutrophils Relative %: 55 %
Platelet Count: 225 10*3/uL (ref 150–400)
RBC: 3.99 MIL/uL — ABNORMAL LOW (ref 4.22–5.81)
RDW: 16.2 % — ABNORMAL HIGH (ref 11.5–15.5)
WBC Count: 4.6 10*3/uL (ref 4.0–10.5)
nRBC: 0 % (ref 0.0–0.2)

## 2021-10-26 LAB — CMP (CANCER CENTER ONLY)
ALT: 19 U/L (ref 0–44)
AST: 15 U/L (ref 15–41)
Albumin: 3.8 g/dL (ref 3.5–5.0)
Alkaline Phosphatase: 83 U/L (ref 38–126)
Anion gap: 6 (ref 5–15)
BUN: 17 mg/dL (ref 8–23)
CO2: 28 mmol/L (ref 22–32)
Calcium: 9.3 mg/dL (ref 8.9–10.3)
Chloride: 104 mmol/L (ref 98–111)
Creatinine: 1.18 mg/dL (ref 0.61–1.24)
GFR, Estimated: >60 mL/min (ref >60)
Glucose, Bld: 104 mg/dL — ABNORMAL HIGH (ref 70–99)
Potassium: 3.8 mmol/L (ref 3.5–5.1)
Sodium: 138 mmol/L (ref 135–145)
Total Bilirubin: 0.4 mg/dL (ref 0.3–1.2)
Total Protein: 6.3 g/dL — ABNORMAL LOW (ref 6.5–8.1)

## 2021-10-26 LAB — IRON AND IRON BINDING CAPACITY (CC-WL,HP ONLY)
Iron: 104 ug/dL (ref 45–182)
Saturation Ratios: 28 % (ref 17.9–39.5)
TIBC: 375 ug/dL (ref 250–450)
UIBC: 271 ug/dL (ref 117–376)

## 2021-10-26 LAB — FERRITIN: Ferritin: 221 ng/mL (ref 24–336)

## 2021-10-26 MED ORDER — KETOROLAC TROMETHAMINE 15 MG/ML IJ SOLN
INTRAMUSCULAR | Status: AC
  Filled 2021-10-26: qty 2

## 2021-10-26 MED ORDER — KETOROLAC TROMETHAMINE 15 MG/ML IJ SOLN
30.0000 mg | Freq: Once | INTRAMUSCULAR | Status: AC
  Administered 2021-10-26: 30 mg via INTRAVENOUS
  Filled 2021-10-26: qty 2

## 2021-10-26 MED ORDER — SODIUM CHLORIDE 0.9 % IV SOLN
1920.0000 mg/m2 | INTRAVENOUS | Status: DC
  Administered 2021-10-26: 3500 mg via INTRAVENOUS
  Filled 2021-10-26: qty 70

## 2021-10-26 MED ORDER — HEPARIN SOD (PORK) LOCK FLUSH 100 UNIT/ML IV SOLN: 500.0000 [IU] | Freq: Once | INTRAVENOUS | Status: AC

## 2021-10-26 MED ORDER — LEUCOVORIN CALCIUM INJECTION 350 MG
400.0000 mg/m2 | Freq: Once | INTRAVENOUS | Status: AC
  Administered 2021-10-26: 728 mg via INTRAVENOUS
  Filled 2021-10-26: qty 36.4

## 2021-10-26 MED ORDER — SODIUM CHLORIDE 0.9% FLUSH: 10.0000 mL | Freq: Once | INTRAVENOUS | Status: AC

## 2021-10-26 MED ORDER — DEXTROSE 5 % IV SOLN: Freq: Once | INTRAVENOUS | Status: AC

## 2021-10-26 MED ORDER — FLUOROURACIL CHEMO INJECTION 2.5 GM/50ML
360.0000 mg/m2 | Freq: Once | INTRAVENOUS | Status: AC
  Administered 2021-10-26: 650 mg via INTRAVENOUS
  Filled 2021-10-26: qty 13

## 2021-10-26 MED ORDER — DEXTROSE 5 % IV SOLN: Freq: Once | INTRAVENOUS | Status: DC

## 2021-10-26 MED ORDER — OXALIPLATIN CHEMO INJECTION 100 MG/20ML
76.5000 mg/m2 | Freq: Once | INTRAVENOUS | Status: AC
  Administered 2021-10-26: 140 mg via INTRAVENOUS
  Filled 2021-10-26: qty 20

## 2021-10-26 MED ORDER — PALONOSETRON HCL INJECTION 0.25 MG/5ML
0.2500 mg | Freq: Once | INTRAVENOUS | Status: AC
  Administered 2021-10-26: 0.25 mg via INTRAVENOUS
  Filled 2021-10-26: qty 5

## 2021-10-26 MED ORDER — SODIUM CHLORIDE 0.9 % IV SOLN
10.0000 mg | Freq: Once | INTRAVENOUS | Status: AC
  Administered 2021-10-26: 10 mg via INTRAVENOUS
  Filled 2021-10-26: qty 10

## 2021-10-26 MED ORDER — GABAPENTIN 300 MG PO CAPS: 300.0000 mg | ORAL_CAPSULE | Freq: Three times a day (TID) | ORAL | 3 refills | Status: DC

## 2021-10-26 NOTE — Progress Notes (Signed)
Patient here for cycle four of treatment. He is tolerating treatment well without significant side effects. He will need a PET prior to his next appointment.   PET scheduled for 11/18/21. Reviewed appointment with patient including date, time, location and prep. All information also supplied with radiology info sheet given to patient.   Oncology Nurse Navigator Documentation     10/26/2021   10:30 AM  Oncology Nurse Navigator Flowsheets  Navigator Follow Up Date: 11/18/2021  Navigator Follow Up Reason: Scan Review  Navigator Location [facility]  Navigator Encounter Type Treatment;Appt/Treatment Plan Review  Patient Visit Type MedOnc  Treatment Phase Active Tx  Barriers/Navigation Needs Coordination of Care;Education  Education Other  Interventions Coordination of Care;Education  Acuity Level 2-Minimal Needs (1-2 Barriers Identified)  Coordination of Care Radiology  Education Method Verbal;Written  Support Groups/Services Friends and Family  Time Spent with Patient 30

## 2021-10-26 NOTE — Progress Notes (Signed)
Hematology and Oncology Follow Up Visit  George Dyer [id number] [birth year] 81 y.o. 10/26/2021   Principle Diagnosis:  Stage IIB (T3N0M0) Gastric cancer -- declines surgery  Current Therapy:   FOLFOX - s/p cycle #3 --  start on 09/15/2021     Interim History:  [name] is back for follow-up.  His main complaint has been leg pain.  There is been no swelling.  He has had this, he said, before he started chemotherapy.  The chemotherapy may have made this a little bit more prominent.  We may want to try some gabapentin on this to see if this may help a little bit.  He is taking tramadol.  As far as a chemotherapy self, he is tolerated this quite well.  He does feel tired.  We will see what his iron studies show today.  He has had no mouth sores.  He has had no diarrhea.  He has had no rashes.  There has been no obvious bleeding.  Overall, I would say his performance status is probably ECOG 1.    Medications:  Current Outpatient Medications:    acetaminophen (TYLENOL) 650 MG CR tablet, Take 1,300 mg by mouth 2 (two) times daily., Disp: , Rfl:    alum & mag hydroxide-simeth (MAALOX/MYLANTA) 200-200-20 MG/5ML suspension, Take 30 mLs by mouth daily., Disp: , Rfl:    Chlorhexidine Gluconate 2 % PADS, Use to sterilize PICC ports prior to flushing, Disp: 100 each, Rfl: 0   dexamethasone (DECADRON) 4 MG tablet, Take 2 tablets (8 mg total) by mouth daily. Start the day after chemotherapy for 2 days. Take with food., Disp: 30 tablet, Rfl: 1   Heparin Na, Pork, Lock Flsh PF 100 UNIT/ML SOLN, Dispense 24 syringes with 2.57m dose (discard remainder). Flush each PICC lumen Monday, Wednesday, and Friday., Disp: 120 mL, Rfl: 1   levothyroxine (SYNTHROID) 50 MCG tablet, Take 50 mcg by mouth daily., Disp: , Rfl:    lisinopril-hydrochlorothiazide (ZESTORETIC) 20-12.5 MG tablet, Take 1 tablet by mouth daily., Disp: 90 tablet, Rfl: 3   omeprazole (PRILOSEC) 40 MG capsule, Take 1 capsule (40 mg total)  by mouth 2 (two) times daily before a meal., Disp: 60 capsule, Rfl: 12   polyethylene glycol (MIRALAX / GLYCOLAX) 17 g packet, Take 17 g by mouth daily., Disp: , Rfl:    rOPINIRole (REQUIP) 2 MG tablet, Take 2 mg by mouth at bedtime., Disp: , Rfl:    sodium chloride flush (NS) 0.9 % SOLN, Inject 10 mLs into each port 3 (three) times a week. Flush each port of PICC Monday, Wednesday and Friday., Disp: 240 mL, Rfl: 1   traMADol (ULTRAM) 50 MG tablet, Take 50 mg by mouth every 6 (six) hours as needed., Disp: , Rfl:    ondansetron (ZOFRAN) 8 MG tablet, Take 1 tablet (8 mg total) by mouth 2 (two) times daily as needed for refractory nausea / vomiting. Start on day 3 after chemotherapy. (Patient not taking: Reported on 10/12/2021), Disp: 30 tablet, Rfl: 1   prochlorperazine (COMPAZINE) 10 MG tablet, Take 1 tablet (10 mg total) by mouth every 6 (six) hours as needed (Nausea or vomiting). (Patient not taking: Reported on 10/12/2021), Disp: 30 tablet, Rfl: 1 No current facility-administered medications for this visit.  Facility-Administered Medications Ordered in Other Visits:    heparin lock flush 100 unit/mL, 500 Units, Intravenous, Once, Noely Kuhnle, PRudell Cobb MD   HYDROcodone-acetaminophen (NORCO/VICODIN) 5-325 MG per tablet 1 tablet, 1 tablet, Oral, Once, CEulas PostSHolli Humbles NP  sodium chloride flush (NS) 0.9 % injection 10 mL, 10 mL, Intravenous, Once, Tempie Gibeault, Rudell Cobb, MD  Allergies:  Allergies  Allergen Reactions   Percocet [Oxycodone-Acetaminophen] Nausea And Vomiting   Oxycodone-Aspirin Nausea And Vomiting    Past Medical History, Surgical history, Social history, and Family History were reviewed and updated.  Review of Systems: Review of Systems  Constitutional:  Positive for fatigue.  HENT:  Negative.    Eyes: Negative.   Respiratory: Negative.    Cardiovascular: Negative.   Gastrointestinal:  Positive for nausea.  Endocrine: Negative.   Genitourinary: Negative.    Musculoskeletal: Negative.    Skin: Negative.   Neurological: Negative.   Hematological: Negative.   Psychiatric/Behavioral: Negative.     Physical Exam:  height is '5\' 9"'$  (1.753 m) and weight is 158 lb (71.7 kg). His oral temperature is 97.8 F (36.6 C). His blood pressure is 137/70 and his pulse is 88. His respiration is 18 and oxygen saturation is 99%.   Wt Readings from Last 3 Encounters:  10/26/21 158 lb (71.7 kg)  10/12/21 156 lb 12.8 oz (71.1 kg)  09/28/21 155 lb (70.3 kg)    Physical Exam Vitals reviewed.  HENT:     Head: Normocephalic and atraumatic.  Eyes:     Pupils: Pupils are equal, round, and reactive to light.  Cardiovascular:     Rate and Rhythm: Normal rate and regular rhythm.     Heart sounds: Normal heart sounds.  Pulmonary:     Effort: Pulmonary effort is normal.     Breath sounds: Normal breath sounds.  Abdominal:     General: Bowel sounds are normal.     Palpations: Abdomen is soft.  Musculoskeletal:        General: No tenderness or deformity. Normal range of motion.     Cervical back: Normal range of motion.  Lymphadenopathy:     Cervical: No cervical adenopathy.  Skin:    General: Skin is warm and dry.     Findings: No erythema or rash.  Neurological:     Mental Status: He is alert and oriented to person, place, and time.  Psychiatric:        Behavior: Behavior normal.        Thought Content: Thought content normal.        Judgment: Judgment normal.     Lab Results  Component Value Date   WBC 4.6 10/26/2021   HGB 11.6 (L) 10/26/2021   HCT 36.2 (L) 10/26/2021   MCV 90.7 10/26/2021   PLT 225 10/26/2021     Chemistry      Component Value Date/Time   NA 138 10/26/2021 0913   NA 139 07/23/2020 0818   K 3.8 10/26/2021 0913   CL 104 10/26/2021 0913   CO2 28 10/26/2021 0913   BUN 17 10/26/2021 0913   BUN 10 07/23/2020 0818   CREATININE 1.18 10/26/2021 0913   CREATININE 1.06 06/27/2015 0837      Component Value Date/Time   CALCIUM 9.3 10/26/2021 0913   ALKPHOS  83 10/26/2021 0913   AST 15 10/26/2021 0913   ALT 19 10/26/2021 0913   BILITOT 0.4 10/26/2021 0913      Impression and Plan: Mr. Prestwood is a very nice 81 year old white male.  He has what I hope to believe localized gastric cancer.  His PET scan certainly was encouraging for not having any obvious advanced or locally advanced disease.  He does not want surgery.  We will go ahead with  his fourth cycle of chemotherapy.  After this cycle, I will repeat his PET scan and we will see how everything looks.  Then we may switch him over to radiation with Xeloda.  This may not be a bad idea as far as his treatment since he does not wish to have surgery.  Hopefully, the gabapentin will help with his leg pain.  I will plan to see him back in 3 weeks.  We will get the PET scan in 2 weeks.   Volanda Napoleon, MD 5/22/202310:26 AM

## 2021-10-26 NOTE — Addendum Note (Signed)
Addended by: Lucile Crater on: 10/26/2021 09:25 AM   Modules accepted: Orders

## 2021-10-26 NOTE — Patient Instructions (Signed)
George Dyer  Discharge Instructions: Thank you for choosing [facility] to provide your oncology and hematology care.   If you have a lab appointment with the [facility], please go directly to the [facility] and check in at the registration area.  Wear comfortable clothing and clothing appropriate for easy access to any Portacath or PICC line.   We strive to give you quality time with your provider. You may need to reschedule your appointment if you arrive late (15 or more minutes).  Arriving late affects you and other patients whose appointments are after yours.  Also, if you miss three or more appointments without notifying the office, you may be dismissed from the clinic at the provider's discretion.      For prescription refill requests, have your pharmacy contact our office and allow 72 hours for refills to be completed.    Today you received the following chemotherapy and/or immunotherapy agents 5-fu, leucovorin, oxaliplatin     To help prevent nausea and vomiting after your treatment, we encourage you to take your nausea medication as directed.  BELOW ARE SYMPTOMS THAT SHOULD BE REPORTED IMMEDIATELY: *FEVER GREATER THAN 100.4 F (38 C) OR HIGHER *CHILLS OR SWEATING *NAUSEA AND VOMITING THAT IS NOT CONTROLLED WITH YOUR NAUSEA MEDICATION *UNUSUAL SHORTNESS OF BREATH *UNUSUAL BRUISING OR BLEEDING *URINARY PROBLEMS (pain or burning when urinating, or frequent urination) *BOWEL PROBLEMS (unusual diarrhea, constipation, pain near the anus) TENDERNESS IN MOUTH AND THROAT WITH OR WITHOUT PRESENCE OF ULCERS (sore throat, sores in mouth, or a toothache) UNUSUAL RASH, SWELLING OR PAIN  UNUSUAL VAGINAL DISCHARGE OR ITCHING   Items with * indicate a potential emergency and should be followed up as soon as possible or go to the Emergency Department if any problems should occur.  Please show the CHEMOTHERAPY ALERT CARD or IMMUNOTHERAPY ALERT CARD  at check-in to the Emergency Department and triage nurse. Should you have questions after your visit or need to cancel or reschedule your appointment, please contact [facility]  [phone] and follow the prompts.  Office hours are 8:00 a.m. to 4:30 p.m. Monday - Friday. Please note that voicemails left after 4:00 p.m. may not be returned until the following business day.  We are closed weekends and major holidays. You have access to a nurse at all times for urgent questions. Please call the main number to the clinic [phone] and follow the prompts.  For any non-urgent questions, you may also contact your provider using MyChart. We now offer e-Visits for anyone 37 and older to request care online for non-urgent symptoms. For details visit [email or web address].   Also download the MyChart app! Go to the app store, search "MyChart", open the app, select [facility], and log in with your MyChart username and password.  Due to Covid, a mask is required upon entering the hospital/clinic. If you do not have a mask, one will be given to you upon arrival. For doctor visits, patients may have 1 support person aged 76 or older with them. For treatment visits, patients cannot have anyone with them due to current Covid guidelines and our immunocompromised population.

## 2021-10-28 ENCOUNTER — Inpatient Hospital Stay: Payer: Medicare HMO

## 2021-10-28 VITALS — BP 115/52 | HR 80 | Temp 97.6°F | Resp 18

## 2021-10-28 DIAGNOSIS — Z95828 Presence of other vascular implants and grafts: Secondary | ICD-10-CM

## 2021-10-28 DIAGNOSIS — C162 Malignant neoplasm of body of stomach: Secondary | ICD-10-CM | POA: Diagnosis not present

## 2021-10-28 DIAGNOSIS — C160 Malignant neoplasm of cardia: Secondary | ICD-10-CM

## 2021-10-28 DIAGNOSIS — Z5111 Encounter for antineoplastic chemotherapy: Secondary | ICD-10-CM | POA: Diagnosis not present

## 2021-10-28 MED ORDER — HEPARIN SOD (PORK) LOCK FLUSH 100 UNIT/ML IV SOLN
500.0000 [IU] | Freq: Once | INTRAVENOUS | Status: AC | PRN
  Administered 2021-10-28: 500 [IU]

## 2021-10-28 MED ORDER — HEPARIN SOD (PORK) LOCK FLUSH 100 UNIT/ML IV SOLN
500.0000 [IU] | Freq: Once | INTRAVENOUS | Status: AC
  Administered 2021-10-28: 500 [IU] via INTRAVENOUS

## 2021-10-28 MED ORDER — SODIUM CHLORIDE 0.9% FLUSH
10.0000 mL | INTRAVENOUS | Status: DC | PRN
  Administered 2021-10-28: 10 mL

## 2021-10-28 MED ORDER — SODIUM CHLORIDE 0.9% FLUSH
10.0000 mL | Freq: Once | INTRAVENOUS | Status: AC
  Administered 2021-10-28: 10 mL via INTRAVENOUS

## 2021-10-28 NOTE — Patient Instructions (Signed)

## 2021-11-04 ENCOUNTER — Inpatient Hospital Stay: Payer: Medicare HMO

## 2021-11-04 VITALS — BP 125/55 | HR 81 | Temp 98.0°F | Resp 18

## 2021-11-04 DIAGNOSIS — Z5111 Encounter for antineoplastic chemotherapy: Secondary | ICD-10-CM | POA: Diagnosis not present

## 2021-11-04 DIAGNOSIS — Z452 Encounter for adjustment and management of vascular access device: Secondary | ICD-10-CM

## 2021-11-04 DIAGNOSIS — C162 Malignant neoplasm of body of stomach: Secondary | ICD-10-CM | POA: Diagnosis not present

## 2021-11-04 MED ORDER — SODIUM CHLORIDE 0.9% FLUSH
10.0000 mL | Freq: Once | INTRAVENOUS | Status: AC
  Administered 2021-11-04: 10 mL via INTRAVENOUS

## 2021-11-04 MED ORDER — HEPARIN SOD (PORK) LOCK FLUSH 100 UNIT/ML IV SOLN
250.0000 [IU] | Freq: Once | INTRAVENOUS | Status: AC
  Administered 2021-11-04: 250 [IU] via INTRAVENOUS

## 2021-11-11 ENCOUNTER — Inpatient Hospital Stay: Payer: Medicare HMO | Attending: Hematology & Oncology

## 2021-11-11 VITALS — BP 125/63 | HR 83 | Temp 98.1°F | Resp 17

## 2021-11-11 DIAGNOSIS — Z79890 Hormone replacement therapy (postmenopausal): Secondary | ICD-10-CM | POA: Insufficient documentation

## 2021-11-11 DIAGNOSIS — Z7952 Long term (current) use of systemic steroids: Secondary | ICD-10-CM | POA: Insufficient documentation

## 2021-11-11 DIAGNOSIS — Z79899 Other long term (current) drug therapy: Secondary | ICD-10-CM | POA: Diagnosis not present

## 2021-11-11 DIAGNOSIS — C162 Malignant neoplasm of body of stomach: Secondary | ICD-10-CM | POA: Insufficient documentation

## 2021-11-11 DIAGNOSIS — Z452 Encounter for adjustment and management of vascular access device: Secondary | ICD-10-CM | POA: Insufficient documentation

## 2021-11-11 MED ORDER — HEPARIN SOD (PORK) LOCK FLUSH 100 UNIT/ML IV SOLN
500.0000 [IU] | Freq: Once | INTRAVENOUS | Status: AC
  Administered 2021-11-11: 500 [IU] via INTRAVENOUS

## 2021-11-11 MED ORDER — SODIUM CHLORIDE 0.9% FLUSH
10.0000 mL | Freq: Once | INTRAVENOUS | Status: AC
  Administered 2021-11-11: 10 mL via INTRAVENOUS

## 2021-11-11 NOTE — Patient Instructions (Signed)
PICC Home Care Guide A peripherally inserted central catheter (PICC) is a form of IV access that allows medicines and IV fluids to be quickly put into the blood and spread throughout the body. The PICC is a long, thin, flexible tube (catheter) that is put into a vein in a person's arm or leg. The catheter ends in a large vein just outside the heart called the superior vena cava (SVC). After the PICC is put in, a chest X-ray may be done to make sure that it is in the right place. A PICC may be placed for different reasons, such as: To give medicines and liquid nutrition. To give IV fluids and blood products. To take blood samples often. If there is trouble placing a peripheral intravenous (PIV) catheter. If cared for properly, a PICC can remain in place for many months. Having a PICC can allow you to go home from the hospital sooner and continue treatment at home. Medicines and PICC care can be managed at home by a family member, caregiver, or home health care team. What are the risks? Generally, having a PICC is safe. However, problems may occur, including: A blood clot (thrombus) forming in or at the end of the PICC. A blood clot forming in a vein (deep vein thrombosis) or traveling to the lung (pulmonary embolism). Inflammation of the vein (phlebitis) in which the PICC is placed. Infection at the insertion site or in the blood. Blood infections from central lines, like PICCs, can be serious and often require a hospital stay. PICC malposition, or PICC movement or poor placement. A break or cut in the PICC. Do not use scissors near the PICC. Nerve or tendon irritation or injury during PICC insertion. How to care for your PICC Please follow the specific guidelines provided by your health care provider. Preventing infection You and any caregivers should wash your hands often with soap and water for at least 20 seconds. Wash hands: Before touching the PICC or the infusion device. Before changing a  bandage (dressing). Do not change the dressing unless you have been taught to do so and have shown you are able to change it safely. Flush the PICC as told. Tell your health care provider right away if the PICC is hard to flush or does not flush. Do not use force to flush the PICC. Use clean and germ-free (sterile) supplies only. Keep the supplies in a dry place. Do not reuse needles, syringes, or any other supplies. Reusing supplies can lead to infection. Keep the PICC dressing dry and secure it with tape if the edges stop sticking to your skin. Check your PICC insertion site every day for signs of infection. Check for: Redness, swelling, or pain. Fluid or blood. Warmth. Pus or a bad smell. Preventing other problems Do not use a syringe that is less than 10 mL to flush the PICC. Do not have your blood pressure checked on the arm in which the PICC is placed. Do not ever pull or tug on the PICC. Keep it secured to your arm with tape or a stretch wrap when not in use. Do not take the PICC out yourself. Only a trained health care provider should remove the PICC. Keep pets and children away from your PICC. How to care for your PICC dressing Keep your PICC dressing clean and dry to prevent infection. Do not take baths, swim, or use a hot tub until your health care provider approves. Ask your health care provider if you can take   showers. You may only be allowed to take sponge baths. When you are allowed to shower: Ask your health care provider to teach you how to wrap the PICC. Cover the PICC with clear plastic wrap and tape to keep it dry while showering. Follow instructions from your health care provider about how to take care of your insertion site and dressing. Make sure you: Wash your hands with soap and water for at least 20 seconds before and after you change your dressing. If soap and water are not available, use hand sanitizer. Change your dressing only if taught to do so by your health care  provider. Your PICC dressing needs to be changed if it becomes loose or wet. Leave stitches (sutures), skin glue, or adhesive strips in place. These skin closures may need to stay in place for 2 weeks or longer. If adhesive strip edges start to loosen and curl up, you may trim the loose edges. Do not remove adhesive strips completely unless your health care provider tells you to do that. Follow these instructions at home: Disposal of supplies Throw away any syringes in a disposal container that is meant for sharp items (sharps container). You can buy a sharps container from a pharmacy, or you can make one by using an empty, hard plastic bottle with a lid. Place any used dressings or infusion bags into a plastic bag. Throw that bag in the trash. General instructions  Always carry your PICC identification card or wear a medical alert bracelet. Keep the tube clamped at all times, unless it is being used. Always carry a smooth-edge clamp with you to clamp the PICC if it breaks. Do not use scissors or sharp objects near the tube. You may bend your arm and move it freely. If your PICC is near or at the bend of your elbow, avoid activity with repeated motion at the elbow. Avoid lifting heavy objects as told by your health care provider. Keep all follow-up visits. This is important. You will need to have your PICC dressing changed at least once a week. Contact a health care provider if: You have pain in your arm, ear, face, or teeth. You have a fever or chills. You have redness, swelling, or pain around the insertion site. You have fluid or blood coming from the insertion site. Your insertion site feels warm to the touch. You have pus or a bad smell coming from the insertion site. Your skin feels hard and raised around the insertion site. Your PICC dressing has gotten wet or is coming off and you have not been taught how to change it. Get help right away if: You have problems with your PICC, such as  your PICC: Was tugged or pulled and has partially come out. Do not  push the PICC back in. Cannot be flushed, is hard to flush, or leaks around the insertion site when it is flushed. Makes a flushing sound when it is flushed. Appears to have a hole or tear. Is accidentally pulled all the way out. If this happens, cover the insertion site with a gauze dressing. Do not throw the PICC away. Your health care provider will need to check it to be sure the entire catheter came out. You feel your heart racing or skipping beats, or you have chest pain. You have shortness of breath or trouble breathing. You have swelling, redness, warmth, or pain in the arm in which the PICC is placed. You have a red streak going up your arm that   starts under the PICC dressing. These symptoms may be an emergency. Get help right away. Call 911. Do not wait to see if the symptoms will go away. Do not drive yourself to the hospital. Summary A peripherally inserted central catheter (PICC) is a long, thin, flexible tube (catheter) that is put into a vein in the arm or leg. If cared for properly, a PICC can remain in place for many months. Having a PICC can allow you to go home from the hospital sooner and continue treatment at home. The PICC is inserted using a germ-free (sterile) technique by a specially trained health care provider. Only a trained health care provider should remove it. Do not have your blood pressure checked on the arm in which your PICC is placed. Always keep your PICC identification card with you. This information is not intended to replace advice given to you by your health care provider. Make sure you discuss any questions you have with your health care provider. Document Revised: 12/10/2020 Document Reviewed: 12/10/2020 Elsevier Patient Education  2023 [employer]  

## 2021-11-18 ENCOUNTER — Inpatient Hospital Stay: Payer: Medicare HMO

## 2021-11-18 ENCOUNTER — Encounter (HOSPITAL_COMMUNITY)
Admission: RE | Admit: 2021-11-18 | Discharge: 2021-11-18 | Disposition: A | Discharge status: Home / Self Care | Payer: Medicare HMO | Source: Ambulatory Visit | Attending: Hematology & Oncology | Admitting: Hematology & Oncology

## 2021-11-18 VITALS — BP 129/71 | HR 79 | Temp 97.9°F | Resp 18

## 2021-11-18 DIAGNOSIS — J439 Emphysema, unspecified: Secondary | ICD-10-CM | POA: Diagnosis not present

## 2021-11-18 DIAGNOSIS — Z452 Encounter for adjustment and management of vascular access device: Secondary | ICD-10-CM

## 2021-11-18 DIAGNOSIS — I2510 Atherosclerotic heart disease of native coronary artery without angina pectoris: Secondary | ICD-10-CM | POA: Diagnosis not present

## 2021-11-18 DIAGNOSIS — C160 Malignant neoplasm of cardia: Secondary | ICD-10-CM | POA: Insufficient documentation

## 2021-11-18 DIAGNOSIS — Z95828 Presence of other vascular implants and grafts: Secondary | ICD-10-CM

## 2021-11-18 DIAGNOSIS — C169 Malignant neoplasm of stomach, unspecified: Secondary | ICD-10-CM | POA: Diagnosis not present

## 2021-11-18 LAB — GLUCOSE, CAPILLARY: Glucose-Capillary: 107 mg/dL — ABNORMAL HIGH (ref 70–99)

## 2021-11-18 MED ORDER — HEPARIN SOD (PORK) LOCK FLUSH 100 UNIT/ML IV SOLN
500.0000 [IU] | Freq: Once | INTRAVENOUS | Status: AC
  Administered 2021-11-18: 500 [IU] via INTRAVENOUS

## 2021-11-18 MED ORDER — SODIUM CHLORIDE 0.9% FLUSH
10.0000 mL | Freq: Once | INTRAVENOUS | Status: AC
  Administered 2021-11-18: 10 mL via INTRAVENOUS

## 2021-11-18 MED ORDER — FLUDEOXYGLUCOSE F - 18 (FDG) INJECTION
7.8800 | Freq: Once | INTRAVENOUS | Status: AC
  Administered 2021-11-18: 7.88 via INTRAVENOUS

## 2021-11-18 NOTE — Addendum Note (Signed)
Addended by: Fabio Neighbors A on: 11/18/2021 11:25 AM   Modules accepted: Orders

## 2021-11-18 NOTE — Patient Instructions (Signed)

## 2021-11-20 ENCOUNTER — Encounter: Payer: Self-pay | Admitting: *Deleted

## 2021-11-20 NOTE — Progress Notes (Signed)
Oncology Nurse Navigator Documentation     11/20/2021    8:00 AM  Oncology Nurse Navigator Flowsheets  Navigator Follow Up Date: 11/23/2021  Navigator Follow Up Reason: Follow-up Appointment  Navigator Location [facility]  Navigator Encounter Type Scan Review  Patient Visit Type MedOnc  Treatment Phase Active Tx  Barriers/Navigation Needs Coordination of Care;Education  Interventions None Required  Acuity Level 2-Minimal Needs (1-2 Barriers Identified)  Support Groups/Services Friends and Family  Time Spent with Patient 15

## 2021-11-23 ENCOUNTER — Inpatient Hospital Stay: Payer: Medicare HMO

## 2021-11-23 ENCOUNTER — Encounter: Payer: Self-pay | Admitting: Hematology & Oncology

## 2021-11-23 ENCOUNTER — Other Ambulatory Visit (HOSPITAL_COMMUNITY): Payer: Self-pay

## 2021-11-23 ENCOUNTER — Other Ambulatory Visit: Payer: Self-pay | Admitting: Oncology

## 2021-11-23 ENCOUNTER — Inpatient Hospital Stay: Payer: Medicare HMO | Admitting: Hematology & Oncology

## 2021-11-23 VITALS — BP 139/75 | HR 76 | Temp 98.2°F | Resp 17 | Wt 158.0 lb

## 2021-11-23 DIAGNOSIS — C162 Malignant neoplasm of body of stomach: Secondary | ICD-10-CM | POA: Diagnosis not present

## 2021-11-23 DIAGNOSIS — Z452 Encounter for adjustment and management of vascular access device: Secondary | ICD-10-CM | POA: Diagnosis not present

## 2021-11-23 DIAGNOSIS — Z79890 Hormone replacement therapy (postmenopausal): Secondary | ICD-10-CM | POA: Diagnosis not present

## 2021-11-23 DIAGNOSIS — C160 Malignant neoplasm of cardia: Secondary | ICD-10-CM

## 2021-11-23 DIAGNOSIS — M79605 Pain in left leg: Secondary | ICD-10-CM

## 2021-11-23 DIAGNOSIS — M79604 Pain in right leg: Secondary | ICD-10-CM | POA: Diagnosis not present

## 2021-11-23 DIAGNOSIS — Z79899 Other long term (current) drug therapy: Secondary | ICD-10-CM | POA: Diagnosis not present

## 2021-11-23 DIAGNOSIS — Z7952 Long term (current) use of systemic steroids: Secondary | ICD-10-CM | POA: Diagnosis not present

## 2021-11-23 LAB — CMP (CANCER CENTER ONLY)
ALT: 16 U/L (ref 0–44)
AST: 15 U/L (ref 15–41)
Albumin: 4.1 g/dL (ref 3.5–5.0)
Alkaline Phosphatase: 114 U/L (ref 38–126)
Anion gap: 9 (ref 5–15)
BUN: 20 mg/dL (ref 8–23)
CO2: 28 mmol/L (ref 22–32)
Calcium: 9.6 mg/dL (ref 8.9–10.3)
Chloride: 101 mmol/L (ref 98–111)
Creatinine: 1.26 mg/dL — ABNORMAL HIGH (ref 0.61–1.24)
GFR, Estimated: 58 mL/min — ABNORMAL LOW (ref >60)
Glucose, Bld: 99 mg/dL (ref 70–99)
Potassium: 4 mmol/L (ref 3.5–5.1)
Sodium: 138 mmol/L (ref 135–145)
Total Bilirubin: 0.3 mg/dL (ref 0.3–1.2)
Total Protein: 7.2 g/dL (ref 6.5–8.1)

## 2021-11-23 LAB — CBC WITH DIFFERENTIAL (CANCER CENTER ONLY)
Abs Immature Granulocytes: 0.03 10*3/uL (ref 0.00–0.07)
Basophils Absolute: 0.1 10*3/uL (ref 0.0–0.1)
Basophils Relative: 2 %
Eosinophils Absolute: 0.1 10*3/uL (ref 0.0–0.5)
Eosinophils Relative: 3 %
HCT: 39 % (ref 39.0–52.0)
Hemoglobin: 12.3 g/dL — ABNORMAL LOW (ref 13.0–17.0)
Immature Granulocytes: 1 %
Lymphocytes Relative: 31 %
Lymphs Abs: 1.6 10*3/uL (ref 0.7–4.0)
MCH: 29.1 pg (ref 26.0–34.0)
MCHC: 31.5 g/dL (ref 30.0–36.0)
MCV: 92.4 fL (ref 80.0–100.0)
Monocytes Absolute: 0.6 10*3/uL (ref 0.1–1.0)
Monocytes Relative: 13 %
Neutro Abs: 2.6 10*3/uL (ref 1.7–7.7)
Neutrophils Relative %: 50 %
Platelet Count: 336 10*3/uL (ref 150–400)
RBC: 4.22 MIL/uL (ref 4.22–5.81)
RDW: 16.2 % — ABNORMAL HIGH (ref 11.5–15.5)
WBC Count: 5.1 10*3/uL (ref 4.0–10.5)
nRBC: 0 % (ref 0.0–0.2)

## 2021-11-23 LAB — LACTATE DEHYDROGENASE: LDH: 130 U/L (ref 98–192)

## 2021-11-23 LAB — FERRITIN: Ferritin: 150 ng/mL (ref 24–336)

## 2021-11-23 MED ORDER — CAPECITABINE 500 MG PO TABS
1500.0000 mg | ORAL_TABLET | Freq: Two times a day (BID) | ORAL | 0 refills | Status: DC
Start: 2021-11-23 — End: 2021-11-25
  Filled 2021-11-23 – 2021-11-24 (×2): qty 100, 17d supply, fill #0

## 2021-11-23 MED ORDER — TEMAZEPAM 7.5 MG PO CAPS: 7.5000 mg | ORAL_CAPSULE | Freq: Every evening | ORAL | 0 refills | Status: DC | PRN

## 2021-11-23 NOTE — Progress Notes (Signed)
Hematology and Oncology Follow Up Visit  LINKEN MCGLOTHEN [id number] [birth year] 81 y.o. 11/23/2021   Principle Diagnosis:  Stage IIB (T3N0M0) Gastric cancer -- declines surgery  Current Therapy:   FOLFOX - s/p cycle #4-  start on 09/15/2021 XRT/Xeloda -- start on 12/07/2021     Interim History:  Mr. Brodrick is back for follow-up.  Unfortunately, I do not think we have gotten as much result out of the FOLFOX as a thought.  We did do a PET scan on him.  This was done on 11/18/2021.  This showed persistent hypermetabolic activity in the gastric fundus.  The SUV is now 13.7.  There is no evidence of disease outside the stomach.  He does have some low-level subcarinal activity which I think has been reactive.  Again, I just hate the fact that he has not responded.  I really thought he would respond to the FOLFOX.  He had good dosing.  At this point, I think we will have to think about radiation therapy.  I would use Xeloda with radiation.  Spoke with Dr. Sondra Come of Radiation Oncology.  He will try to get Mr. Zingaro and try to get started next week.  Mr. Schiavo is eating okay.  He is having no abdominal pain.  He is having no problems with bowels or bladder.  He has had no mouth sores with the FOLFOX.  He is having a lot of leg problems.  I do not know if this is restless legs of this is peripheral vascular disease.  He has not been evaluated for this.  I think we will going to have to get some arterial studies.  The problem is that if we find that he does have arterial disease, he probably would not be a good candidate for anticoagulation or antiplatelet drugs because of his gastric mass.  I will see if may be Restoril might help him sleep at nighttime.  I will try low-dose of Restoril at 7.5 mg p.o. nightly.  He has had no issues with bleeding.  He has had no leg swelling.  He has had no headache.  Overall, I would say his performance status is probably ECOG 1.  .    Medications:   Current Outpatient Medications:    acetaminophen (TYLENOL) 650 MG CR tablet, Take 1,300 mg by mouth 2 (two) times daily., Disp: , Rfl:    alum & mag hydroxide-simeth (MAALOX/MYLANTA) 200-200-20 MG/5ML suspension, Take 30 mLs by mouth daily., Disp: , Rfl:    capecitabine (XELODA) 500 MG tablet, Take 3 tablets (1,500 mg total) by mouth 2 (two) times daily after a meal. Start 2 days BEFORE radiation starts!!!, Disp: 100 tablet, Rfl: 0   Chlorhexidine Gluconate 2 % PADS, Use to sterilize PICC ports prior to flushing, Disp: 100 each, Rfl: 0   dexamethasone (DECADRON) 4 MG tablet, Take 2 tablets (8 mg total) by mouth daily. Start the day after chemotherapy for 2 days. Take with food., Disp: 30 tablet, Rfl: 1   gabapentin (NEURONTIN) 300 MG capsule, Take 1 capsule (300 mg total) by mouth 3 (three) times daily., Disp: 90 capsule, Rfl: 3   Heparin Na, Pork, Lock Flsh PF 100 UNIT/ML SOLN, Dispense 24 syringes with 2.15m dose (discard remainder). Flush each PICC lumen Monday, Wednesday, and Friday., Disp: 120 mL, Rfl: 1   levothyroxine (SYNTHROID) 50 MCG tablet, Take 50 mcg by mouth daily., Disp: , Rfl:    lisinopril-hydrochlorothiazide (ZESTORETIC) 20-12.5 MG tablet, Take 1 tablet by mouth daily., Disp:  90 tablet, Rfl: 3   omeprazole (PRILOSEC) 40 MG capsule, Take 1 capsule (40 mg total) by mouth 2 (two) times daily before a meal., Disp: 60 capsule, Rfl: 12   ondansetron (ZOFRAN) 8 MG tablet, Take 1 tablet (8 mg total) by mouth 2 (two) times daily as needed for refractory nausea / vomiting. Start on day 3 after chemotherapy., Disp: 30 tablet, Rfl: 1   polyethylene glycol (MIRALAX / GLYCOLAX) 17 g packet, Take 17 g by mouth daily., Disp: , Rfl:    prochlorperazine (COMPAZINE) 10 MG tablet, Take 1 tablet (10 mg total) by mouth every 6 (six) hours as needed (Nausea or vomiting)., Disp: 30 tablet, Rfl: 1   rOPINIRole (REQUIP) 2 MG tablet, Take 2 mg by mouth at bedtime., Disp: , Rfl:    sodium chloride flush (NS)  0.9 % SOLN, Inject 10 mLs into each port 3 (three) times a week. Flush each port of PICC Monday, Wednesday and Friday., Disp: 240 mL, Rfl: 1   temazepam (RESTORIL) 7.5 MG capsule, Take 1 capsule (7.5 mg total) by mouth at bedtime as needed for sleep., Disp: 30 capsule, Rfl: 0   traMADol (ULTRAM) 50 MG tablet, Take 50 mg by mouth every 6 (six) hours as needed., Disp: , Rfl:  No current facility-administered medications for this visit.  Facility-Administered Medications Ordered in Other Visits:    heparin lock flush 100 unit/mL, 500 Units, Intravenous, Once, Cece Milhouse, Rudell Cobb, MD   HYDROcodone-acetaminophen (NORCO/VICODIN) 5-325 MG per tablet 1 tablet, 1 tablet, Oral, Once, Celso Amy, NP   sodium chloride flush (NS) 0.9 % injection 10 mL, 10 mL, Intravenous, Once, Ozil Stettler, Rudell Cobb, MD  Allergies:  Allergies  Allergen Reactions   Percocet [Oxycodone-Acetaminophen] Nausea And Vomiting   Oxycodone-Aspirin Nausea And Vomiting    Past Medical History, Surgical history, Social history, and Family History were reviewed and updated.  Review of Systems: Review of Systems  Constitutional:  Positive for fatigue.  HENT:  Negative.    Eyes: Negative.   Respiratory: Negative.    Cardiovascular: Negative.   Gastrointestinal:  Positive for nausea.  Endocrine: Negative.   Genitourinary: Negative.    Musculoskeletal: Negative.   Skin: Negative.   Neurological: Negative.   Hematological: Negative.   Psychiatric/Behavioral: Negative.      Physical Exam:  weight is 158 lb (71.7 kg). His oral temperature is 98.2 F (36.8 C). His blood pressure is 139/75 and his pulse is 76. His respiration is 17 and oxygen saturation is 96%.   Wt Readings from Last 3 Encounters:  11/23/21 158 lb (71.7 kg)  11/23/21 158 lb (71.7 kg)  10/26/21 158 lb (71.7 kg)    Physical Exam Vitals reviewed.  HENT:     Head: Normocephalic and atraumatic.  Eyes:     Pupils: Pupils are equal, round, and reactive to  light.  Cardiovascular:     Rate and Rhythm: Normal rate and regular rhythm.     Heart sounds: Normal heart sounds.  Pulmonary:     Effort: Pulmonary effort is normal.     Breath sounds: Normal breath sounds.  Abdominal:     General: Bowel sounds are normal.     Palpations: Abdomen is soft.  Musculoskeletal:        General: No tenderness or deformity. Normal range of motion.     Cervical back: Normal range of motion.  Lymphadenopathy:     Cervical: No cervical adenopathy.  Skin:    General: Skin is warm and dry.  Findings: No erythema or rash.  Neurological:     Mental Status: He is alert and oriented to person, place, and time.  Psychiatric:        Behavior: Behavior normal.        Thought Content: Thought content normal.        Judgment: Judgment normal.      Lab Results  Component Value Date   WBC 5.1 11/23/2021   HGB 12.3 (L) 11/23/2021   HCT 39.0 11/23/2021   MCV 92.4 11/23/2021   PLT 336 11/23/2021     Chemistry      Component Value Date/Time   NA 138 11/23/2021 1546   NA 139 07/23/2020 0818   K 4.0 11/23/2021 1546   CL 101 11/23/2021 1546   CO2 28 11/23/2021 1546   BUN 20 11/23/2021 1546   BUN 10 07/23/2020 0818   CREATININE 1.26 (H) 11/23/2021 1546   CREATININE 1.06 06/27/2015 0837      Component Value Date/Time   CALCIUM 9.6 11/23/2021 1546   ALKPHOS 114 11/23/2021 1546   AST 15 11/23/2021 1546   ALT 16 11/23/2021 1546   BILITOT 0.3 11/23/2021 1546      Impression and Plan: Mr. Hoar is a very nice 81 year old white male.  He has what I hope to believe localized gastric cancer.   He has had 4 cycles of chemotherapy.  Again, does not look that the chemotherapy has been all that effective.  We will now try for radiation with Xeloda.  I think this will be reasonable.  I will dose his Xeloda 1500 mg daily.  I told him to take the Xeloda Monday to Friday while he is getting the radiation..  I went over side effects of Xeloda.  I told him that  he has to wear sunscreen on his face and exposed skin areas if he is going to be outside.  I mentioned that he may have little bit of diarrhea.  I would think that with low-dose Xeloda, he would not have much in the way of toxicity.  I told him to start the Xeloda  2 days before he starts the radiation.  I also told him that after treatment, we probably have to wait a good 2 months before we would do another PET scan on him.  Before that, we probably would do an upper endoscopy to see how everything looked.  Again he does not wish to have surgery.  I sort of tend to agree with him right now.  I suspect that this is going to be a difficult cancer to cure.  Again may be it will have a good sensitivity to radiation.  He has a PICC line in.  I do not think we need to have the PICC line any longer.  We will see about getting the PICC line taken out.  I would like to see him back myself about 3 to 4 weeks from now.  I want to see him about 2 weeks after he starts his radiation/Xeloda.    Volanda Napoleon, MD 6/19/20235:07 PM

## 2021-11-23 NOTE — Patient Instructions (Signed)
PICC Home Care Guide A peripherally inserted central catheter (PICC) is a form of IV access that allows medicines and IV fluids to be quickly put into the blood and spread throughout the body. The PICC is a long, thin, flexible tube (catheter) that is put into a vein in a person's arm or leg. The catheter ends in a large vein just outside the heart called the superior vena cava (SVC). After the PICC is put in, a chest X-ray may be done to make sure that it is in the right place. A PICC may be placed for different reasons, such as: To give medicines and liquid nutrition. To give IV fluids and blood products. To take blood samples often. If there is trouble placing a peripheral intravenous (PIV) catheter. If cared for properly, a PICC can remain in place for many months. Having a PICC can allow you to go home from the hospital sooner and continue treatment at home. Medicines and PICC care can be managed at home by a family member, caregiver, or home health care team. What are the risks? Generally, having a PICC is safe. However, problems may occur, including: A blood clot (thrombus) forming in or at the end of the PICC. A blood clot forming in a vein (deep vein thrombosis) or traveling to the lung (pulmonary embolism). Inflammation of the vein (phlebitis) in which the PICC is placed. Infection at the insertion site or in the blood. Blood infections from central lines, like PICCs, can be serious and often require a hospital stay. PICC malposition, or PICC movement or poor placement. A break or cut in the PICC. Do not use scissors near the PICC. Nerve or tendon irritation or injury during PICC insertion. How to care for your PICC Please follow the specific guidelines provided by your health care provider. Preventing infection You and any caregivers should wash your hands often with soap and water for at least 20 seconds. Wash hands: Before touching the PICC or the infusion device. Before changing a  bandage (dressing). Do not change the dressing unless you have been taught to do so and have shown you are able to change it safely. Flush the PICC as told. Tell your health care provider right away if the PICC is hard to flush or does not flush. Do not use force to flush the PICC. Use clean and germ-free (sterile) supplies only. Keep the supplies in a dry place. Do not reuse needles, syringes, or any other supplies. Reusing supplies can lead to infection. Keep the PICC dressing dry and secure it with tape if the edges stop sticking to your skin. Check your PICC insertion site every day for signs of infection. Check for: Redness, swelling, or pain. Fluid or blood. Warmth. Pus or a bad smell. Preventing other problems Do not use a syringe that is less than 10 mL to flush the PICC. Do not have your blood pressure checked on the arm in which the PICC is placed. Do not ever pull or tug on the PICC. Keep it secured to your arm with tape or a stretch wrap when not in use. Do not take the PICC out yourself. Only a trained health care provider should remove the PICC. Keep pets and children away from your PICC. How to care for your PICC dressing Keep your PICC dressing clean and dry to prevent infection. Do not take baths, swim, or use a hot tub until your health care provider approves. Ask your health care provider if you can take   showers. You may only be allowed to take sponge baths. When you are allowed to shower: Ask your health care provider to teach you how to wrap the PICC. Cover the PICC with clear plastic wrap and tape to keep it dry while showering. Follow instructions from your health care provider about how to take care of your insertion site and dressing. Make sure you: Wash your hands with soap and water for at least 20 seconds before and after you change your dressing. If soap and water are not available, use hand sanitizer. Change your dressing only if taught to do so by your health care  provider. Your PICC dressing needs to be changed if it becomes loose or wet. Leave stitches (sutures), skin glue, or adhesive strips in place. These skin closures may need to stay in place for 2 weeks or longer. If adhesive strip edges start to loosen and curl up, you may trim the loose edges. Do not remove adhesive strips completely unless your health care provider tells you to do that. Follow these instructions at home: Disposal of supplies Throw away any syringes in a disposal container that is meant for sharp items (sharps container). You can buy a sharps container from a pharmacy, or you can make one by using an empty, hard plastic bottle with a lid. Place any used dressings or infusion bags into a plastic bag. Throw that bag in the trash. General instructions  Always carry your PICC identification card or wear a medical alert bracelet. Keep the tube clamped at all times, unless it is being used. Always carry a smooth-edge clamp with you to clamp the PICC if it breaks. Do not use scissors or sharp objects near the tube. You may bend your arm and move it freely. If your PICC is near or at the bend of your elbow, avoid activity with repeated motion at the elbow. Avoid lifting heavy objects as told by your health care provider. Keep all follow-up visits. This is important. You will need to have your PICC dressing changed at least once a week. Contact a health care provider if: You have pain in your arm, ear, face, or teeth. You have a fever or chills. You have redness, swelling, or pain around the insertion site. You have fluid or blood coming from the insertion site. Your insertion site feels warm to the touch. You have pus or a bad smell coming from the insertion site. Your skin feels hard and raised around the insertion site. Your PICC dressing has gotten wet or is coming off and you have not been taught how to change it. Get help right away if: You have problems with your PICC, such as  your PICC: Was tugged or pulled and has partially come out. Do not  push the PICC back in. Cannot be flushed, is hard to flush, or leaks around the insertion site when it is flushed. Makes a flushing sound when it is flushed. Appears to have a hole or tear. Is accidentally pulled all the way out. If this happens, cover the insertion site with a gauze dressing. Do not throw the PICC away. Your health care provider will need to check it to be sure the entire catheter came out. You feel your heart racing or skipping beats, or you have chest pain. You have shortness of breath or trouble breathing. You have swelling, redness, warmth, or pain in the arm in which the PICC is placed. You have a red streak going up your arm that   starts under the PICC dressing. These symptoms may be an emergency. Get help right away. Call 911. Do not wait to see if the symptoms will go away. Do not drive yourself to the hospital. Summary A peripherally inserted central catheter (PICC) is a long, thin, flexible tube (catheter) that is put into a vein in the arm or leg. If cared for properly, a PICC can remain in place for many months. Having a PICC can allow you to go home from the hospital sooner and continue treatment at home. The PICC is inserted using a germ-free (sterile) technique by a specially trained health care provider. Only a trained health care provider should remove it. Do not have your blood pressure checked on the arm in which your PICC is placed. Always keep your PICC identification card with you. This information is not intended to replace advice given to you by your health care provider. Make sure you discuss any questions you have with your health care provider. Document Revised: 12/10/2020 Document Reviewed: 12/10/2020 Elsevier Patient Education  2023 [employer]  

## 2021-11-24 ENCOUNTER — Telehealth: Payer: Self-pay | Admitting: Pharmacy Technician

## 2021-11-24 ENCOUNTER — Telehealth: Payer: Self-pay | Admitting: Pharmacist

## 2021-11-24 ENCOUNTER — Encounter: Payer: Self-pay | Admitting: *Deleted

## 2021-11-24 ENCOUNTER — Telehealth: Payer: Self-pay | Admitting: Radiation Oncology

## 2021-11-24 ENCOUNTER — Other Ambulatory Visit (HOSPITAL_COMMUNITY): Payer: Self-pay

## 2021-11-24 DIAGNOSIS — C160 Malignant neoplasm of cardia: Secondary | ICD-10-CM

## 2021-11-24 LAB — IRON AND IRON BINDING CAPACITY (CC-WL,HP ONLY)
Iron: 68 ug/dL (ref 45–182)
Saturation Ratios: 20 % (ref 17.9–39.5)
TIBC: 347 ug/dL (ref 250–450)
UIBC: 279 ug/dL (ref 117–376)

## 2021-11-24 NOTE — Telephone Encounter (Signed)
Oral Oncology Patient Advocate Encounter   Received notification from [facility] that prior authorization for Capecitabine is required.   PA submitted on CoverMyMeds Key [id number]  Status is pending   Oral Oncology Clinic will continue to follow.  [place] Patient Donalds Phone [phone] Fax [phone] 11/24/2021 10:39 AM

## 2021-11-24 NOTE — Telephone Encounter (Signed)
Called patient's daughter to schedule patient for a consultation w. Dr. Sondra Come. No answer, LVM for a return call.

## 2021-11-24 NOTE — Telephone Encounter (Signed)
Oral Oncology Pharmacist Encounter  Received new prescription for Xeloda (capecitabine) for the treatment of gastric cancer in conjunction with radiation, planned for duration of radiation.  CBC w/ Diff and CMP from 11/23/21 assessed, noted pt Scr of 1.26 mg/dL (CrCl ~48 mL/min). Will discuss with Dr. Marin Olp if he would like to dose reduce patient's Xeloda dose based on renal impairment with CrCl < 50 mL/min.  Current medication list in Epic reviewed, DDIs with Xeloda identified: DDI between Xeloda and Omeprazole - proton-pump inhibitors can decrease efficacy of Xeloda - will discuss with patient alternatives to omeprazole, such as H2RA's like famotidine while on Xeloda. DDI between Xeloda and Ondansetron due to risk of Qtc prolongation with fluorouracil products. Noted patient only taking PRN and PO route, risk higher with IV administration. No change in therapy warranted at this time.   Evaluated chart and no patient barriers to medication adherence noted.   Prescription has been e-scribed to the [facility] for benefits analysis and approval.  Oral Oncology Clinic will continue to follow for insurance authorization, copayment issues, initial counseling and start date.  Leron Croak, PharmD, BCPS Hematology/Oncology Clinical Pharmacist [facility] [place] [phone] 11/24/2021 8:41 AM

## 2021-11-24 NOTE — Telephone Encounter (Signed)
Oral Oncology Patient Advocate Encounter  Prior Authorization for Capecitabine has been approved under Medicare Part B benefit.    PA# [id number] Effective dates: 11/24/21 through 06/06/22  Patients co-pay is $18.00 for (#120 tabs/28 days).  Oral Oncology Clinic will continue to follow.   [place] Patient George Dyer Phone [phone] Fax [phone] 11/24/2021 10:43 AM

## 2021-11-24 NOTE — Progress Notes (Signed)
Patient's PET shows progression, so treatment will be changed to Xeloda/XRT. Xeloda prescription sent and referral order placed.   Patient has a PICC line that can be dc'd. MD and family would rather have it removed by IR at [facility]. Order placed and message sent to IR scheduler.   Will follow up to ensure appointments with IR and RadOnc.   Oncology Nurse Navigator Documentation     11/24/2021    8:45 AM  Oncology Nurse Navigator Flowsheets  Navigator Follow Up Date: 11/25/2021  Navigator Follow Up Reason: Appointment Review  Navigator Location [facility]  Navigator Encounter Type Appt/Treatment Plan Review  Patient Visit Type MedOnc  Treatment Phase Active Tx  Barriers/Navigation Needs Coordination of Care;Education  Interventions Coordination of Care;Referrals  Acuity Level 2-Minimal Needs (1-2 Barriers Identified)  Referrals Radiation Oncology  Coordination of Care Radiology  Support Groups/Services Friends and Family  Time Spent with Patient 30

## 2021-11-25 ENCOUNTER — Other Ambulatory Visit: Payer: Self-pay | Admitting: Pharmacist

## 2021-11-25 ENCOUNTER — Other Ambulatory Visit (HOSPITAL_COMMUNITY): Payer: Self-pay

## 2021-11-25 ENCOUNTER — Encounter: Payer: Self-pay | Admitting: *Deleted

## 2021-11-25 ENCOUNTER — Encounter: Payer: Self-pay | Admitting: Hematology & Oncology

## 2021-11-25 ENCOUNTER — Other Ambulatory Visit: Payer: Self-pay

## 2021-11-25 ENCOUNTER — Other Ambulatory Visit: Payer: Self-pay | Admitting: Hematology & Oncology

## 2021-11-25 ENCOUNTER — Encounter (HOSPITAL_COMMUNITY): Payer: Medicare HMO

## 2021-11-25 DIAGNOSIS — M79604 Pain in right leg: Secondary | ICD-10-CM

## 2021-11-25 MED ORDER — TRAZODONE HCL 50 MG PO TABS: 50.0000 mg | ORAL_TABLET | Freq: Every day | ORAL | 0 refills | Status: DC

## 2021-11-25 MED ORDER — CAPECITABINE 500 MG PO TABS
1500.0000 mg | ORAL_TABLET | Freq: Every day | ORAL | 0 refills | Status: DC
  Filled 2021-11-25 (×2): qty 100, 33d supply, fill #0
  Filled 2021-11-28: qty 90, 30d supply, fill #0

## 2021-11-25 NOTE — Progress Notes (Signed)
GI Location of Tumor / Histology: Stage IIB Gastric cancer   Biopsies revealed: INVASIVE MODERATELY DIFFERENTIATED ADENOCARCINOMA  Past/Anticipated interventions by surgeon, if any: no  Past/Anticipated interventions by medical oncology, if any: FOLFOX - s/p cycle #4-  start on 09/15/2021. XRT/Xeloda -- start on 12/07/2021  Weight changes, if any: yes -has lost about 10 lbs  Bowel/Bladder complaints, if any: no  Nausea / Vomiting, if any: no  Pain issues, if any:  yes - has pain and off in his stomach.    Any blood per rectum:   no  SAFETY ISSUES: Prior radiation? no Pacemaker/ICD? no Possible current pregnancy? no Is the patient on methotrexate? no  Current Complaints/Details: Patient is here with his daughter.  BP 112/61 (BP Location: Left Arm, Patient Position: Sitting)   Pulse 73   Temp 98.2 F (36.8 C) (Temporal)   Resp 18   Ht '5\' 9"'$  (1.753 m)   Wt 156 lb (70.8 kg)   SpO2 97%   BMI 23.04 kg/m

## 2021-11-25 NOTE — Progress Notes (Signed)
PICC removal scheduled for Thursday. New patient appointment with RadOnc scheduled for 12/02/21.   Oncology Nurse Navigator Documentation     11/25/2021    8:30 AM  Oncology Nurse Navigator Flowsheets  Navigator Follow Up Date: 12/02/2021  Navigator Follow Up Reason: Review Note  Navigator Location [facility]  Navigator Encounter Type Appt/Treatment Plan Review  Patient Visit Type MedOnc  Treatment Phase Active Tx  Barriers/Navigation Needs Coordination of Care;Education  Interventions None Required  Acuity Level 2-Minimal Needs (1-2 Barriers Identified)  Support Groups/Services Friends and Family  Time Spent with Patient 15

## 2021-11-26 ENCOUNTER — Other Ambulatory Visit: Payer: Self-pay | Admitting: Hematology & Oncology

## 2021-11-26 ENCOUNTER — Encounter (HOSPITAL_COMMUNITY): Payer: Self-pay | Admitting: Radiology

## 2021-11-26 ENCOUNTER — Ambulatory Visit (HOSPITAL_COMMUNITY)
Admission: RE | Admit: 2021-11-26 | Discharge: 2021-11-26 | Disposition: A | Discharge status: Home / Self Care | Payer: Medicare HMO | Source: Ambulatory Visit | Attending: Hematology & Oncology | Admitting: Hematology & Oncology

## 2021-11-26 DIAGNOSIS — Z452 Encounter for adjustment and management of vascular access device: Secondary | ICD-10-CM | POA: Insufficient documentation

## 2021-11-26 DIAGNOSIS — C160 Malignant neoplasm of cardia: Secondary | ICD-10-CM

## 2021-11-26 HISTORY — PX: IR PATIENT EVAL TECH 0-60 MINS: IMG5564

## 2021-11-26 NOTE — Procedures (Signed)
Pt seen per Dr Marin Olp for removal of RIGHT PICC. Line was removed easily with no bleeding; fully intact. LDA updated

## 2021-11-27 ENCOUNTER — Ambulatory Visit (HOSPITAL_BASED_OUTPATIENT_CLINIC_OR_DEPARTMENT_OTHER)
Admission: RE | Admit: 2021-11-27 | Discharge: 2021-11-27 | Disposition: A | Discharge status: Home / Self Care | Payer: Medicare HMO | Source: Ambulatory Visit | Attending: Hematology & Oncology | Admitting: Hematology & Oncology

## 2021-11-27 ENCOUNTER — Encounter: Payer: Self-pay | Admitting: *Deleted

## 2021-11-27 DIAGNOSIS — C160 Malignant neoplasm of cardia: Secondary | ICD-10-CM | POA: Diagnosis not present

## 2021-11-27 DIAGNOSIS — M79605 Pain in left leg: Secondary | ICD-10-CM | POA: Diagnosis not present

## 2021-11-27 DIAGNOSIS — Z452 Encounter for adjustment and management of vascular access device: Secondary | ICD-10-CM | POA: Diagnosis not present

## 2021-11-27 DIAGNOSIS — M79604 Pain in right leg: Secondary | ICD-10-CM | POA: Diagnosis not present

## 2021-11-27 NOTE — Telephone Encounter (Signed)
Start date 12/07/21.

## 2021-11-28 ENCOUNTER — Other Ambulatory Visit (HOSPITAL_COMMUNITY): Payer: Self-pay

## 2021-11-30 ENCOUNTER — Other Ambulatory Visit (HOSPITAL_COMMUNITY): Payer: Self-pay

## 2021-12-01 ENCOUNTER — Other Ambulatory Visit (HOSPITAL_COMMUNITY): Payer: Self-pay

## 2021-12-01 NOTE — Progress Notes (Signed)
Radiation Oncology         [phone] ________________________________  Initial Outpatient Consultation  Name: George Dyer MRN: [id number]  Date: 12/02/2021  DOB: 04-28-1941  PX:TGGYI, Gillis Santa, FNP  Volanda Napoleon, MD   REFERRING PHYSICIAN: Volanda Napoleon, MD  DIAGNOSIS: The encounter diagnosis was Primary cancer of cardia of stomach ([place]).  Stage IIB (T3N0M0) Gastric cancer, moderately differentiated adenocarcinoma  HISTORY OF PRESENT ILLNESS::George Dyer is a 81 y.o. male who is accompanied by his daughter. he is seen as a courtesy of Dr. Marin Olp for an opinion concerning radiation therapy as part of management for his recently diagnosed gastric cancer.   The patient presented to Dr. [name], [facility], on 06/18/21, with the cc's of lower abdominal pain, longstanding worsening constipation, 15 pounds weight loss, and abdominal bloating x 2 months. He underwent an x-ray not too long before this visit which showed significant stool in the right colon, but no signs of obstruction. Additionally, the patient endorsed dysphagia with solid food x 2 years, which he defined as food getting stuck in his throat causing nausea, and intermittent yet progressive mid chest pain associated with heart burn. He otherwise denied any SOB, wheezing, or cough.   CT of the chest abdomen and pelvis for further evaluation on 06/23/21 showed: occluded SVC likely from scarring, with substantial venous collateral structures in the chest and abdomen; some air-fluid levels in the rectosigmoid with some mixed stool within the fluid density; and a 3 mm left solid pulmonary nodule within the upper lobe.   Barium swallow study performed on 07/07/21 revealed moderate esophageal dysmotility without evidence of esophageal stricture.  Subsequently, the patient has an upper endoscopy and colonoscopy with biopsies performed on 08/06/21 with Dr. [name]. Upper endoscopy revealed a stomach mass  measuring 3 x 3 cm. Pathology revealed:   -- Stomach mass Bx: invasive moderately differentiated adenocarcinoma -- Ascending colon polyp and transverse polyps: negative for malignancy with tubular adenomas. -- Transverse colon polyps: negative for malignancy with tubular adenomas.  The patient then underwent an EGD with esophageal biopsies on 08/13/21 which revealed reactive squamous mucosa without acute inflammatory cells or eosinophils.   - Endoscopic ultrasound performed at this time showed  a 48 x 15 mm mass in the cardia of the stomach which invaded through the wall of the stomach into the serosa but not through the serosa. No malignant appearing perigastric lymph nodes were identified. However, in the periesophageal mediastinum, 2 slightly enlarged lymph nodes were appreciated, although they were well formed. The 2 mediastinal lymph nodes measured 6.8 x 6.8 mm and 4.3 x 1.5 mm respectively. (These were not biopsied).  Accordingly, the patient was referred to Dr. Marin Olp on 08/18/21 for further evaluation and management. In terms of treatment options, Dr. Marin Olp recommended chemotherapy consisting of folfox given his age, and a PET scan for further evaluation.   PET on 08/20/21 showed: the hypermetabolism identified in the wall of the posterior gastric fundus, compatible with the patient's known primary neoplasm, and a tiny focus of indeterminate hypermetabolism in the peripheral aspect of the superior right hilum, with no underlying pulmonary nodule or lymph node evident on CT imaging. Otherwise, PET showed no definite evidence of hypermetabolic metastatic disease in the chest, abdomen or pelvis.   Dr. [name] also referred to the patient to Dr. Crisoforo Oxford, [facility] on 08/24/21 to discuss surgical options. The patient was advised to complete 3 months of chemo, and then return for restaging imaging for surgical  purposes. Surgical intervention considered and discussed during this visit was a total  gastrectomy. The patient later voiced to Dr. Marin Olp that he did not want to pursue surgical intervention.   The patient accordingly began cycle 1 of FOLFOX on 09/15/21. During a follow up visit with Dr. Marin Olp on 09/28/21, the patient was noted to have tolerated his first cycle of systemic treatment well other than fatigue and a sore mouth. The patient completed his 4th and final cycle of FOLFOX on 10/26/21.  Restaging PET scan on 11/18/21 unfortunately showed persistent as well as a slight increase in hypermetabolic activity posteriorly in the gastric fundus at the site of the patient's known gastric malignancy (SUV max 13.5, previously 6.8). PET also showed persistent indeterminate hypermetabolic activity in the right suprahilar region without clear CT correlate, possibly reflective of a reactive intrapulmonary node. No evidence of disease was appreciated outside of the stomach.  Given lack of treatment response demonstrated on PET, Dr. Marin Olp recommended to the patient to proceed with Xeloda and concurrent radiation therapy.  Of note: the patient endorsed some leg pain during chemotherapy to Dr. Marin Olp prompting an LE doppler study on 11/27/21 which showed normal findings in both lower extremities.   PREVIOUS RADIATION THERAPY: No  PAST MEDICAL HISTORY:  Past Medical History:  Diagnosis Date   Anxiety    Arthritis    Constipation    ED (erectile dysfunction)    GERD (gastroesophageal reflux disease)    Goals of care, counseling/discussion 08/18/2021   Headache    Hypertension    Kidney stone    Lumbar disc disease    Pinched nerve    Pneumonia    Primary cancer of cardia of stomach ([place]) 08/18/2021   SVC syndrome    SECONDARY TO CHRONIC FIBROSING MEDIASTINITIS    PAST SURGICAL HISTORY: Past Surgical History:  Procedure Laterality Date   BIOPSY  08/13/2021   Procedure: BIOPSY;  Surgeon: Irving Copas., MD;  Location: [facility] ENDOSCOPY;  Service: Gastroenterology;;   BREAST  BIOPSY     RIGHT BREAST   CARDIAC SURGERY  1970's   wife wasn't sure what type of heart surgery.   COLONOSCOPY     ESOPHAGOGASTRODUODENOSCOPY (EGD) WITH PROPOFOL N/A 08/13/2021   Procedure: ESOPHAGOGASTRODUODENOSCOPY (EGD) WITH PROPOFOL;  Surgeon: Rush Landmark Telford Nab., MD;  Location: [place];  Service: Gastroenterology;  Laterality: N/A;   EUS N/A 08/13/2021   Procedure: UPPER ENDOSCOPIC ULTRASOUND (EUS) RADIAL;  Surgeon: Irving Copas., MD;  Location: [place];  Service: Gastroenterology;  Laterality: N/A;   INGUINAL HERNIA REPAIR  07/10/2012   Procedure: LAPAROSCOPIC INGUINAL HERNIA;  Surgeon: Ralene Ok, MD;  Location: [place];  Service: General;  Laterality: Right;   INGUINAL HERNIA REPAIR  07/10/12   RIH   INSERTION OF MESH  07/10/2012   Procedure: INSERTION OF MESH;  Surgeon: Ralene Ok, MD;  Location: [place];  Service: General;  Laterality: Right;   IR PATIENT EVAL TECH 0-60 MINS  11/26/2021   LUMBAR DISC SURGERY     ORIF HUMERUS FRACTURE Left 01/13/2016   Procedure: OPEN REDUCTION INTERNAL FIXATION (ORIF) LEFT PROXIMAL HUMERUS FRACTURE;  Surgeon: Roseanne Kaufman, MD;  Location: [place];  Service: Orthopedics;  Laterality: Left;   TONSILLECTOMY     US ECHOCARDIOGRAPHY  04/20/1993   EF 65%    FAMILY HISTORY:  Family History  Problem Relation Age of Onset   Hypertension Mother    Heart attack Father    Heart disease Father    Cancer Maternal  Aunt        breast   Cancer Maternal Uncle    Colon cancer Neg Hx    Stomach cancer Neg Hx    Rectal cancer Neg Hx     SOCIAL HISTORY:  Social History   Tobacco Use   Smoking status: Former    Packs/day: 0.25    Years: 6.00    Total pack years: 1.50    Types: Cigarettes    Quit date: 03/25/1981    Years since quitting: 40.7   Smokeless tobacco: Never  Vaping Use   Vaping Use: Never used  Substance Use Topics   Alcohol use: No   Drug use: No    ALLERGIES:  Allergies  Allergen Reactions   Percocet  [Oxycodone-Acetaminophen] Nausea And Vomiting   Oxycodone-Aspirin Nausea And Vomiting    MEDICATIONS:  Current Outpatient Medications  Medication Sig Dispense Refill   acetaminophen (TYLENOL) 650 MG CR tablet Take 1,300 mg by mouth 2 (two) times daily.     alum & mag hydroxide-simeth (MAALOX/MYLANTA) 200-200-20 MG/5ML suspension Take 30 mLs by mouth daily.     lisinopril-hydrochlorothiazide (ZESTORETIC) 20-12.5 MG tablet Take 1 tablet by mouth daily. 90 tablet 3   omeprazole (PRILOSEC) 40 MG capsule Take 1 capsule (40 mg total) by mouth 2 (two) times daily before a meal. 60 capsule 12   polyethylene glycol (MIRALAX / GLYCOLAX) 17 g packet Take 17 g by mouth daily.     traMADol (ULTRAM) 50 MG tablet Take 50 mg by mouth every 6 (six) hours as needed.     capecitabine (XELODA) 500 MG tablet Take 3 tablets (1,500 mg total) by mouth daily. Take within 30 minutes after a meal. Start 2 days BEFORE radiation starts!!! Take on days of radiation thereafter. 90 tablet 0   Chlorhexidine Gluconate 2 % PADS Use to sterilize PICC ports prior to flushing 100 each 0   gabapentin (NEURONTIN) 300 MG capsule Take 1 capsule (300 mg total) by mouth 3 (three) times daily. (Patient not taking: Reported on 12/02/2021) 90 capsule 3   Heparin Na, Pork, Lock Flsh PF 100 UNIT/ML SOLN Dispense 24 syringes with 2.75m dose (discard remainder). Flush each PICC lumen Monday, Wednesday, and Friday. 120 mL 1   levothyroxine (SYNTHROID) 50 MCG tablet Take 50 mcg by mouth daily. (Patient not taking: Reported on 12/02/2021)     rOPINIRole (REQUIP) 2 MG tablet Take 2 mg by mouth at bedtime. (Patient not taking: Reported on 12/02/2021)     sodium chloride flush (NS) 0.9 % SOLN Inject 10 mLs into each port 3 (three) times a week. Flush each port of PICC Monday, Wednesday and Friday. 240 mL 1   No current facility-administered medications for this encounter.   Facility-Administered Medications Ordered in Other Encounters  Medication Dose  Route Frequency Provider Last Rate Last Admin   heparin lock flush 100 unit/mL  500 Units Intravenous Once Ennever, PRudell Cobb MD       HYDROcodone-acetaminophen (NORCO/VICODIN) 5-325 MG per tablet 1 tablet  1 tablet Oral Once CCelso Amy NP       sodium chloride flush (NS) 0.9 % injection 10 mL  10 mL Intravenous Once Ennever, PRudell Cobb MD        REVIEW OF SYSTEMS:  A 10+ POINT REVIEW OF SYSTEMS WAS OBTAINED including neurology, dermatology, psychiatry, cardiac, respiratory, lymph, extremities, GI, GU, musculoskeletal, constitutional, reproductive, HEENT.  He reports a good appetite.  He denies any early satiety.  He has noticed his endurance has  dropped off quite a bit and is unable to work full-time.  He denies any epigastric pain.  He is prone to constipation.   PHYSICAL EXAM:  height is '5\' 9"'$  (1.753 m) and weight is 156 lb (70.8 kg). His temporal temperature is 98.2 F (36.8 C). His blood pressure is 112/61 and his pulse is 73. His respiration is 18 and oxygen saturation is 97%.   General: Alert and oriented, in no acute distress HEENT: Head is normocephalic. Extraocular movements are intact.  Neck: Neck is supple, no palpable cervical or supraclavicular lymphadenopathy. Heart: Regular in rate and rhythm with no murmurs, rubs, or gallops. Chest: Clear to auscultation bilaterally, with no rhonchi, wheezes, or rales. Abdomen: Soft, nontender, nondistended, with no rigidity or guarding. Extremities: No cyanosis or edema. Lymphatics: see Neck Exam Skin: No concerning lesions. Musculoskeletal: symmetric strength and muscle tone throughout. Neurologic: Cranial nerves II through XII are grossly intact. No obvious focalities. Speech is fluent. Coordination is intact. Psychiatric: Judgment and insight are intact. Affect is appropriate.   ECOG = 1  0 - Asymptomatic (Fully active, able to carry on all predisease activities without restriction)  1 - Symptomatic but completely ambulatory  (Restricted in physically strenuous activity but ambulatory and able to carry out work of a light or sedentary nature. For example, light housework, office work)  2 - Symptomatic, <50% in bed during the day (Ambulatory and capable of all self care but unable to carry out any work activities. Up and about more than 50% of waking hours)  3 - Symptomatic, >50% in bed, but not bedbound (Capable of only limited self-care, confined to bed or chair 50% or more of waking hours)  4 - Bedbound (Completely disabled. Cannot carry on any self-care. Totally confined to bed or chair)  5 - Death   Eustace Pen MM, Creech RH, Tormey DC, et al. [phone]). "Toxicity and response criteria of the [facility]". [place] Oncol. 5 (6): 649-55  LABORATORY DATA:  Lab Results  Component Value Date   WBC 5.1 11/23/2021   HGB 12.3 (L) 11/23/2021   HCT 39.0 11/23/2021   MCV 92.4 11/23/2021   PLT 336 11/23/2021   NEUTROABS 2.6 11/23/2021   Lab Results  Component Value Date   NA 138 11/23/2021   K 4.0 11/23/2021   CL 101 11/23/2021   CO2 28 11/23/2021   GLUCOSE 99 11/23/2021   BUN 20 11/23/2021   CREATININE 1.26 (H) 11/23/2021   CALCIUM 9.6 11/23/2021      RADIOGRAPHY: VAS [place] ABI WITH/WO TBI  Result Date: 11/27/2021  LOWER EXTREMITY DOPPLER STUDY Patient Name:  George Dyer  Date of Exam:   11/27/2021 Medical Rec #: [id number]         Accession #:    [id number] Date of Birth: 06-29-1940        Patient Gender: M Patient Age:   89 years Exam Location:  [facility] Procedure:      VAS [place] ABI WITH/WO TBI Referring Phys: Burney Gauze --------------------------------------------------------------------------------  Indications: Pain. High Risk Factors: Hypertension.  Comparison Study: No prior study Performing Technologist: Maudry Mayhew MHA, RVT, RDCS, RDMS  Examination Guidelines: A complete evaluation includes at minimum, Doppler waveform signals and systolic blood pressure reading  at the level of bilateral brachial, anterior tibial, and posterior tibial arteries, when vessel segments are accessible. Bilateral testing is considered an integral part of a complete examination. Photoelectric Plethysmograph (PPG) waveforms and toe systolic pressure readings are included as  required and additional duplex testing as needed. Limited examinations for reoccurring indications may be performed as noted.  ABI Findings: +---------+------------------+-----+---------+--------+ Right    Rt Pressure (mmHg)IndexWaveform Comment  +---------+------------------+-----+---------+--------+ Brachial 128                    triphasic         +---------+------------------+-----+---------+--------+ PTA      137               1.07 triphasic         +---------+------------------+-----+---------+--------+ DP       132               1.03 biphasic          +---------+------------------+-----+---------+--------+ Great Toe108               0.84                   +---------+------------------+-----+---------+--------+ +---------+------------------+-----+---------+-------+ Left     Lt Pressure (mmHg)IndexWaveform Comment +---------+------------------+-----+---------+-------+ Brachial 127                    triphasic        +---------+------------------+-----+---------+-------+ PTA      148               1.16 triphasic        +---------+------------------+-----+---------+-------+ DP       141               1.10 biphasic         +---------+------------------+-----+---------+-------+ Great Toe118               0.92                  +---------+------------------+-----+---------+-------+ +-------+-----------+-----------+------------+------------+ ABI/TBIToday's ABIToday's TBIPrevious ABIPrevious TBI +-------+-----------+-----------+------------+------------+ Right  1.07       0.84                                 +-------+-----------+-----------+------------+------------+ Left   1.16       0.92                                +-------+-----------+-----------+------------+------------+  Summary: Right: Resting right ankle-brachial index is within normal range. No evidence of significant right lower extremity arterial disease. The right toe-brachial index is normal. Left: Resting left ankle-brachial index is within normal range. No evidence of significant left lower extremity arterial disease. The left toe-brachial index is normal. *See table(s) above for measurements and observations.  Electronically signed by Monica Martinez MD on 11/27/2021 at 11:22:06 AM.    Final    IR PATIENT EVAL TECH 0-60 MINS  Result Date: 11/26/2021 Janine Limbo, RT     11/26/2021 12:18 PM Pt seen per Dr Marin Olp for removal of RIGHT PICC. Line was removed easily with no bleeding; fully intact. LDA updated  NM PET Image Restag (PS) Skull Base To Thigh  Result Date: 11/19/2021 CLINICAL DATA:  Subsequent treatment strategy for localized gastric cancer. EXAM: NUCLEAR MEDICINE PET SKULL BASE TO THIGH TECHNIQUE: 7.88 mCi F-18 FDG was injected intravenously. Full-ring PET imaging was performed from the skull base to thigh after the radiotracer. CT data was obtained and used for attenuation correction and anatomic localization. Fasting blood glucose: 107 mg/dl COMPARISON:  PET-CT 08/20/2021. CTs of the chest, abdomen and pelvis 06/23/2021 FINDINGS: Mediastinal  blood pool activity: SUV max 2.5 Liver activity: SUV max 3.8 NECK: No hypermetabolic cervical lymph nodes are identified.There are no lesions of the pharyngeal mucosal space. Incidental CT findings: none CHEST: There are no hypermetabolic mediastinal, hilar or axillary lymph nodes. Low level subcarinal metabolic activity (SUV max 3.3), likely reactive. Focal hypermetabolic activity is again noted in the right suprahilar region without clear corresponding abnormality on the CT images.  This has an SUV max of 3.7 (previously 3.6). No other pulmonary hypermetabolic activity or suspicious nodularity. Incidental CT findings: Right arm PICC projects to the upper SVC level. The SVC is chronically occluded with multiple paraspinal venous collaterals, as before. Atherosclerosis of the aorta, great vessels and coronary arteries. Mild emphysema with scattered calcified granulomas. ABDOMEN/PELVIS: There is no hypermetabolic activity within the liver, adrenal glands, spleen or pancreas. Persistent intense hypermetabolic activity involving the posterior wall of the gastric fundus (SUV max 13.5, previously 6.8). The distribution of this hypermetabolic activity is similar to the prior study. No adjacent or distant hypermetabolic lymph nodes identified in the abdomen or pelvis. Incidental CT findings: Aortic and branch vessel atherosclerosis. Calcified splenic granulomas. Venous collaterals in the anterior abdominal wall. SKELETON: There is no hypermetabolic activity to suggest osseous metastatic disease. Incidental CT findings: Previous lumbar fusion and proximal left humeral ORIF. IMPRESSION: 1. Persistent hypermetabolic activity posteriorly in the gastric fundus at the site of the patient's known gastric malignancy. This has mildly increased in intensity compared with the prior PET-CT. No hypermetabolic regional adenopathy or definite distant metastases identified. 2. Persistent indeterminate hypermetabolic activity in the right suprahilar region without clear corresponding CT finding. This could reflect a reactive intrapulmonary node. Given underlying emphysema, attention on follow-up recommended to exclude an occult nodule. 3. Sequela of chronic SVC occlusion. 4. Coronary and aortic atherosclerosis (ICD10-I70.0). Emphysema (ICD10-J43.9). Electronically Signed   By: Richardean Sale M.D.   On: 11/19/2021 17:01      IMPRESSION:  Stage IIB (T3N0M0) Gastric cancer   He initially proceeded with full dose  chemotherapy however on recent PET scan there was no appreciable change in the metabolic activity within the gastric region.  Fortunately no evidence of metastatic spread on recent PET CT scan.  Discussed surgery again with the patient and he is not interested in this approach.  He would be a good candidate for definitive course of radiation therapy along with radiosensitizing oral chemotherapy.  Today, I talked to the patient and his daughter about the findings and work-up thus far.  We discussed the natural history of gastric cancer and general treatment, highlighting the role of radiotherapy in the management.  We discussed the available radiation techniques, and focused on the details of logistics and delivery.  We reviewed the anticipated acute and late sequelae associated with radiation in this setting.  The patient was encouraged to ask questions that I answered to the best of my ability.  A patient consent form was discussed and signed.  We retained a copy for our records.  The patient would like to proceed with radiation and will be scheduled for CT simulation.  PLAN: We will proceed with CT simulation later today.  Treatments to begin July 5 concomitant with his oral chemotherapy.  Anticipate 5-1/2 weeks of radiation therapy.   60 minutes of total time was spent for this patient encounter, including preparation, face-to-face counseling with the patient and coordination of care, physical exam, and documentation of the encounter.   ------------------------------------------------  Blair Promise, PhD, MD  This document serves  as a record of services personally performed by Gery Pray, MD. It was created on his behalf by Roney Mans, a trained medical scribe. The creation of this record is based on the scribe's personal observations and the provider's statements to them. This document has been checked and approved by the attending provider.

## 2021-12-02 ENCOUNTER — Other Ambulatory Visit (HOSPITAL_COMMUNITY): Payer: Self-pay

## 2021-12-02 ENCOUNTER — Ambulatory Visit
Admission: RE | Admit: 2021-12-02 | Discharge: 2021-12-02 | Disposition: A | Discharge status: Home / Self Care | Payer: Medicare HMO | Source: Ambulatory Visit | Attending: Radiation Oncology | Admitting: Radiation Oncology

## 2021-12-02 ENCOUNTER — Other Ambulatory Visit: Payer: Self-pay

## 2021-12-02 ENCOUNTER — Ambulatory Visit: Admission: RE | Admit: 2021-12-02 | Payer: Medicare HMO | Source: Ambulatory Visit | Admitting: Radiation Oncology

## 2021-12-02 ENCOUNTER — Encounter: Payer: Self-pay | Admitting: *Deleted

## 2021-12-02 ENCOUNTER — Encounter: Payer: Self-pay | Admitting: Radiation Oncology

## 2021-12-02 VITALS — BP 112/61 | HR 73 | Temp 98.2°F | Resp 18 | Ht 69.0 in | Wt 156.0 lb

## 2021-12-02 DIAGNOSIS — C160 Malignant neoplasm of cardia: Secondary | ICD-10-CM

## 2021-12-02 DIAGNOSIS — C161 Malignant neoplasm of fundus of stomach: Secondary | ICD-10-CM | POA: Diagnosis not present

## 2021-12-02 MED ORDER — CAPECITABINE 500 MG PO TABS
1500.0000 mg | ORAL_TABLET | Freq: Every day | ORAL | 0 refills | Status: DC
  Filled 2021-12-02: qty 90, 30d supply, fill #0
  Filled 2021-12-02: qty 60, 28d supply, fill #0
  Filled 2021-12-14 – 2021-12-22 (×2): qty 60, 28d supply, fill #1

## 2021-12-02 NOTE — Progress Notes (Signed)
Patient seen by Radiation Oncology today. Will be simulated later today and start ChemoRT next Wednesday.   Oncology Nurse Navigator Documentation     12/02/2021   12:30 PM  Oncology Nurse Navigator Flowsheets  Navigator Follow Up Date: 12/09/2021  Navigator Follow Up Reason: Radiation  Navigator Location [facility]  Navigator Encounter Type Appt/Treatment Plan Review  Patient Visit Type MedOnc  Treatment Phase Active Tx  Barriers/Navigation Needs Coordination of Care;Education  Interventions None Required  Acuity Level 2-Minimal Needs (1-2 Barriers Identified)  Support Groups/Services Friends and Family  Time Spent with Patient 15

## 2021-12-02 NOTE — Telephone Encounter (Signed)
Oral Chemotherapy Pharmacist Encounter  I spoke with patient's daughter, Sharyn Lull, for overview of: Xeloda (capecitabine) for the treatment of for the treatment of gastric cancer in conjunction with radiation, planned duration 5 1/2 weeks.  Counseled on administration, dosing, side effects, monitoring, drug-food interactions, safe handling, storage, and disposal.  Patient will take Xeloda '500mg'$  tablets, 3 tablets ('1500mg'$ ) by mouth once daily within 30 minutes of finishing a meal, on days of radiation only. Per Dr. Marin Olp, patient will start Xeloda 2 days prior to radiation start date.  Xeloda start date: 12/07/21 Radiation start date: 12/09/21  Adverse effects of Xeloda include but are not limited to: fatigue, decreased blood counts, GI upset, diarrhea, mouth sores, and hand-foot syndrome. Patient will obtain anti diarrheal and alert the office of 4 or more loose stools above baseline.   Reviewed importance of keeping a medication schedule and plan for any missed doses. No barriers to medication adherence identified.  Medication reconciliation performed and medication/allergy list updated. Patient's daughter willing to help father trial Pepcid (famotidine) twice daily while patient is on Xeloda due to drug drug interaction with omeprazole.   Insurance authorization for Xeloda has been obtained. This will ship from the [place] on 12/02/21 to deliver to patient's home on 12/03/21.  All questions answered.  Patient's daughter Sharyn Lull voiced understanding and appreciation.   Medication education handout and medication calendar placed in mail for patient and patient's family. Patient's daughter knows to call the office with questions or concerns. Oral Chemotherapy Clinic phone number provided.   Leron Croak, PharmD, BCPS Hematology/Oncology Clinical Pharmacist [facility] [place] [phone] 12/02/2021 12:49 PM

## 2021-12-03 NOTE — Telephone Encounter (Signed)
Oral Oncology Patient Advocate Encounter  I spoke with Sharyn Lull, patients daughter, on 12/02/21 to set up delivery of Capecitabine.  Address verified for shipment and will be filled through [facility] and mailed 6/29 for delivery 6/30.    [facility] will call 7-10 days before next refill is due to complete adherence call and set up delivery of medication.     [place] [facility] [place] Phone [phone] Fax [phone] 12/03/2021 10:29 AM

## 2021-12-07 DIAGNOSIS — R1310 Dysphagia, unspecified: Secondary | ICD-10-CM | POA: Insufficient documentation

## 2021-12-07 DIAGNOSIS — Z79899 Other long term (current) drug therapy: Secondary | ICD-10-CM | POA: Insufficient documentation

## 2021-12-07 DIAGNOSIS — Z510 Encounter for antineoplastic radiation therapy: Secondary | ICD-10-CM | POA: Diagnosis not present

## 2021-12-07 DIAGNOSIS — R52 Pain, unspecified: Secondary | ICD-10-CM | POA: Diagnosis not present

## 2021-12-07 DIAGNOSIS — C160 Malignant neoplasm of cardia: Secondary | ICD-10-CM | POA: Insufficient documentation

## 2021-12-07 DIAGNOSIS — C161 Malignant neoplasm of fundus of stomach: Secondary | ICD-10-CM | POA: Diagnosis not present

## 2021-12-09 ENCOUNTER — Other Ambulatory Visit: Payer: Self-pay

## 2021-12-09 ENCOUNTER — Ambulatory Visit
Admission: RE | Admit: 2021-12-09 | Discharge: 2021-12-09 | Disposition: A | Discharge status: Home / Self Care | Payer: Medicare HMO | Source: Ambulatory Visit | Attending: Radiation Oncology | Admitting: Radiation Oncology

## 2021-12-09 DIAGNOSIS — Z79899 Other long term (current) drug therapy: Secondary | ICD-10-CM | POA: Diagnosis not present

## 2021-12-09 DIAGNOSIS — C160 Malignant neoplasm of cardia: Secondary | ICD-10-CM | POA: Diagnosis not present

## 2021-12-09 DIAGNOSIS — R1310 Dysphagia, unspecified: Secondary | ICD-10-CM | POA: Diagnosis not present

## 2021-12-09 DIAGNOSIS — C161 Malignant neoplasm of fundus of stomach: Secondary | ICD-10-CM | POA: Diagnosis not present

## 2021-12-09 DIAGNOSIS — R52 Pain, unspecified: Secondary | ICD-10-CM | POA: Diagnosis not present

## 2021-12-09 DIAGNOSIS — Z510 Encounter for antineoplastic radiation therapy: Secondary | ICD-10-CM | POA: Diagnosis not present

## 2021-12-09 LAB — RAD ONC ARIA SESSION SUMMARY
Course Elapsed Days: 0
Plan Fractions Treated to Date: 1
Plan Prescribed Dose Per Fraction: 1.8 Gy
Plan Total Fractions Prescribed: 25
Plan Total Prescribed Dose: 45 Gy
Reference Point Dosage Given to Date: 1.8 Gy
Reference Point Session Dosage Given: 1.8 Gy
Session Number: 1

## 2021-12-10 ENCOUNTER — Other Ambulatory Visit: Payer: Self-pay

## 2021-12-10 ENCOUNTER — Ambulatory Visit
Admission: RE | Admit: 2021-12-10 | Discharge: 2021-12-10 | Disposition: A | Discharge status: Home / Self Care | Payer: Medicare HMO | Source: Ambulatory Visit | Attending: Radiation Oncology | Admitting: Radiation Oncology

## 2021-12-10 ENCOUNTER — Encounter: Payer: Self-pay | Admitting: *Deleted

## 2021-12-10 DIAGNOSIS — R52 Pain, unspecified: Secondary | ICD-10-CM | POA: Diagnosis not present

## 2021-12-10 DIAGNOSIS — C160 Malignant neoplasm of cardia: Secondary | ICD-10-CM | POA: Diagnosis not present

## 2021-12-10 DIAGNOSIS — Z510 Encounter for antineoplastic radiation therapy: Secondary | ICD-10-CM | POA: Diagnosis not present

## 2021-12-10 DIAGNOSIS — Z79899 Other long term (current) drug therapy: Secondary | ICD-10-CM | POA: Diagnosis not present

## 2021-12-10 DIAGNOSIS — R1310 Dysphagia, unspecified: Secondary | ICD-10-CM | POA: Diagnosis not present

## 2021-12-10 DIAGNOSIS — C161 Malignant neoplasm of fundus of stomach: Secondary | ICD-10-CM | POA: Diagnosis not present

## 2021-12-10 LAB — RAD ONC ARIA SESSION SUMMARY
Course Elapsed Days: 1
Plan Fractions Treated to Date: 2
Plan Prescribed Dose Per Fraction: 1.8 Gy
Plan Total Fractions Prescribed: 25
Plan Total Prescribed Dose: 45 Gy
Reference Point Dosage Given to Date: 3.6 Gy
Reference Point Session Dosage Given: 1.8 Gy
Session Number: 2

## 2021-12-10 NOTE — Progress Notes (Signed)
Oncology Nurse Navigator Documentation     12/10/2021    9:30 AM  Oncology Nurse Navigator Flowsheets  Planned Course of Treatment Chemo/Radiation Concurrent  Phase of Treatment Chemo/Radiation Concurrent  Chemo/Radiation Concurrent Actual Start Date: 12/09/2021  Chemo/Radiation Concurrent Expected End Date: 01/15/2022  Navigator Follow Up Date: 12/25/2021  Navigator Follow Up Reason: Follow-up Appointment  Navigator Location [facility]  Navigator Encounter Type Appt/Treatment Plan Review  Patient Visit Type MedOnc  Treatment Phase Active Tx  Barriers/Navigation Needs No Barriers At This Time  Interventions None Required  Acuity Level 1-No Barriers  Support Groups/Services Friends and Family  Time Spent with Patient 15

## 2021-12-11 ENCOUNTER — Other Ambulatory Visit: Payer: Self-pay

## 2021-12-11 ENCOUNTER — Ambulatory Visit
Admission: RE | Admit: 2021-12-11 | Discharge: 2021-12-11 | Disposition: A | Discharge status: Home / Self Care | Payer: Medicare HMO | Source: Ambulatory Visit | Attending: Radiation Oncology | Admitting: Radiation Oncology

## 2021-12-11 DIAGNOSIS — R52 Pain, unspecified: Secondary | ICD-10-CM | POA: Diagnosis not present

## 2021-12-11 DIAGNOSIS — C161 Malignant neoplasm of fundus of stomach: Secondary | ICD-10-CM | POA: Diagnosis not present

## 2021-12-11 DIAGNOSIS — Z79899 Other long term (current) drug therapy: Secondary | ICD-10-CM | POA: Diagnosis not present

## 2021-12-11 DIAGNOSIS — R1310 Dysphagia, unspecified: Secondary | ICD-10-CM | POA: Diagnosis not present

## 2021-12-11 DIAGNOSIS — Z510 Encounter for antineoplastic radiation therapy: Secondary | ICD-10-CM | POA: Diagnosis not present

## 2021-12-11 DIAGNOSIS — C160 Malignant neoplasm of cardia: Secondary | ICD-10-CM | POA: Diagnosis not present

## 2021-12-11 LAB — RAD ONC ARIA SESSION SUMMARY
Course Elapsed Days: 2
Plan Fractions Treated to Date: 3
Plan Prescribed Dose Per Fraction: 1.8 Gy
Plan Total Fractions Prescribed: 25
Plan Total Prescribed Dose: 45 Gy
Reference Point Dosage Given to Date: 5.4 Gy
Reference Point Session Dosage Given: 1.8 Gy
Session Number: 3

## 2021-12-14 ENCOUNTER — Other Ambulatory Visit (HOSPITAL_COMMUNITY): Payer: Self-pay

## 2021-12-14 ENCOUNTER — Other Ambulatory Visit: Payer: Self-pay

## 2021-12-14 ENCOUNTER — Ambulatory Visit
Admission: RE | Admit: 2021-12-14 | Discharge: 2021-12-14 | Disposition: A | Discharge status: Home / Self Care | Payer: Medicare HMO | Source: Ambulatory Visit | Attending: Radiation Oncology | Admitting: Radiation Oncology

## 2021-12-14 DIAGNOSIS — Z510 Encounter for antineoplastic radiation therapy: Secondary | ICD-10-CM | POA: Diagnosis not present

## 2021-12-14 DIAGNOSIS — Z79899 Other long term (current) drug therapy: Secondary | ICD-10-CM | POA: Diagnosis not present

## 2021-12-14 DIAGNOSIS — C161 Malignant neoplasm of fundus of stomach: Secondary | ICD-10-CM | POA: Diagnosis not present

## 2021-12-14 DIAGNOSIS — R52 Pain, unspecified: Secondary | ICD-10-CM | POA: Diagnosis not present

## 2021-12-14 DIAGNOSIS — R1310 Dysphagia, unspecified: Secondary | ICD-10-CM | POA: Diagnosis not present

## 2021-12-14 DIAGNOSIS — C160 Malignant neoplasm of cardia: Secondary | ICD-10-CM | POA: Diagnosis not present

## 2021-12-14 LAB — RAD ONC ARIA SESSION SUMMARY
Course Elapsed Days: 5
Plan Fractions Treated to Date: 4
Plan Prescribed Dose Per Fraction: 1.8 Gy
Plan Total Fractions Prescribed: 25
Plan Total Prescribed Dose: 45 Gy
Reference Point Dosage Given to Date: 7.2 Gy
Reference Point Session Dosage Given: 1.8 Gy
Session Number: 4

## 2021-12-15 ENCOUNTER — Ambulatory Visit: Payer: Medicare HMO

## 2021-12-15 ENCOUNTER — Ambulatory Visit
Admission: RE | Admit: 2021-12-15 | Discharge: 2021-12-15 | Disposition: A | Discharge status: Home / Self Care | Payer: Medicare HMO | Source: Ambulatory Visit | Attending: Radiation Oncology | Admitting: Radiation Oncology

## 2021-12-15 ENCOUNTER — Other Ambulatory Visit: Payer: Self-pay

## 2021-12-15 DIAGNOSIS — R1310 Dysphagia, unspecified: Secondary | ICD-10-CM | POA: Diagnosis not present

## 2021-12-15 DIAGNOSIS — C161 Malignant neoplasm of fundus of stomach: Secondary | ICD-10-CM | POA: Diagnosis not present

## 2021-12-15 DIAGNOSIS — Z79899 Other long term (current) drug therapy: Secondary | ICD-10-CM | POA: Diagnosis not present

## 2021-12-15 DIAGNOSIS — R52 Pain, unspecified: Secondary | ICD-10-CM | POA: Diagnosis not present

## 2021-12-15 DIAGNOSIS — C160 Malignant neoplasm of cardia: Secondary | ICD-10-CM | POA: Diagnosis not present

## 2021-12-15 DIAGNOSIS — Z510 Encounter for antineoplastic radiation therapy: Secondary | ICD-10-CM | POA: Diagnosis not present

## 2021-12-15 LAB — RAD ONC ARIA SESSION SUMMARY
Course Elapsed Days: 6
Plan Fractions Treated to Date: 5
Plan Prescribed Dose Per Fraction: 1.8 Gy
Plan Total Fractions Prescribed: 25
Plan Total Prescribed Dose: 45 Gy
Reference Point Dosage Given to Date: 9 Gy
Reference Point Session Dosage Given: 1.8 Gy
Session Number: 5

## 2021-12-15 MED ORDER — SONAFINE EX EMUL
1.0000 | Freq: Once | CUTANEOUS | Status: AC
  Administered 2021-12-15: 1 via TOPICAL

## 2021-12-15 NOTE — Progress Notes (Signed)
Pt here for patient teaching.  Pt given Radiation and You booklet, skin care instructions, and Sonafine.  Reviewed areas of pertinence such as diarrhea, fatigue, hair loss, nausea and vomiting, and skin changes . Pt able to give teach back of to pat skin, use unscented/gentle soap, and have Imodium on hand,apply Sonafine bid and avoid applying anything to skin within 4 hours of treatment. Pt demonstrated understanding and verbalizes understanding of information given and will contact nursing with any questions or concerns.

## 2021-12-16 ENCOUNTER — Other Ambulatory Visit: Payer: Self-pay

## 2021-12-16 ENCOUNTER — Ambulatory Visit
Admission: RE | Admit: 2021-12-16 | Discharge: 2021-12-16 | Disposition: A | Discharge status: Home / Self Care | Payer: Medicare HMO | Source: Ambulatory Visit | Attending: Radiation Oncology | Admitting: Radiation Oncology

## 2021-12-16 DIAGNOSIS — R52 Pain, unspecified: Secondary | ICD-10-CM | POA: Diagnosis not present

## 2021-12-16 DIAGNOSIS — R1310 Dysphagia, unspecified: Secondary | ICD-10-CM | POA: Diagnosis not present

## 2021-12-16 DIAGNOSIS — C160 Malignant neoplasm of cardia: Secondary | ICD-10-CM | POA: Diagnosis not present

## 2021-12-16 DIAGNOSIS — Z79899 Other long term (current) drug therapy: Secondary | ICD-10-CM | POA: Diagnosis not present

## 2021-12-16 DIAGNOSIS — Z510 Encounter for antineoplastic radiation therapy: Secondary | ICD-10-CM | POA: Diagnosis not present

## 2021-12-16 DIAGNOSIS — C161 Malignant neoplasm of fundus of stomach: Secondary | ICD-10-CM | POA: Diagnosis not present

## 2021-12-16 LAB — RAD ONC ARIA SESSION SUMMARY
Course Elapsed Days: 7
Plan Fractions Treated to Date: 6
Plan Prescribed Dose Per Fraction: 1.8 Gy
Plan Total Fractions Prescribed: 25
Plan Total Prescribed Dose: 45 Gy
Reference Point Dosage Given to Date: 10.8 Gy
Reference Point Session Dosage Given: 1.8 Gy
Session Number: 6

## 2021-12-17 ENCOUNTER — Ambulatory Visit
Admission: RE | Admit: 2021-12-17 | Discharge: 2021-12-17 | Disposition: A | Discharge status: Home / Self Care | Payer: Medicare HMO | Source: Ambulatory Visit | Attending: Radiation Oncology | Admitting: Radiation Oncology

## 2021-12-17 ENCOUNTER — Other Ambulatory Visit: Payer: Self-pay

## 2021-12-17 DIAGNOSIS — R1310 Dysphagia, unspecified: Secondary | ICD-10-CM | POA: Diagnosis not present

## 2021-12-17 DIAGNOSIS — R52 Pain, unspecified: Secondary | ICD-10-CM | POA: Diagnosis not present

## 2021-12-17 DIAGNOSIS — C161 Malignant neoplasm of fundus of stomach: Secondary | ICD-10-CM | POA: Diagnosis not present

## 2021-12-17 DIAGNOSIS — C160 Malignant neoplasm of cardia: Secondary | ICD-10-CM | POA: Diagnosis not present

## 2021-12-17 DIAGNOSIS — Z79899 Other long term (current) drug therapy: Secondary | ICD-10-CM | POA: Diagnosis not present

## 2021-12-17 DIAGNOSIS — Z510 Encounter for antineoplastic radiation therapy: Secondary | ICD-10-CM | POA: Diagnosis not present

## 2021-12-17 LAB — RAD ONC ARIA SESSION SUMMARY
Course Elapsed Days: 8
Plan Fractions Treated to Date: 7
Plan Prescribed Dose Per Fraction: 1.8 Gy
Plan Total Fractions Prescribed: 25
Plan Total Prescribed Dose: 45 Gy
Reference Point Dosage Given to Date: 12.6 Gy
Reference Point Session Dosage Given: 1.8 Gy
Session Number: 7

## 2021-12-18 ENCOUNTER — Ambulatory Visit
Admission: RE | Admit: 2021-12-18 | Discharge: 2021-12-18 | Disposition: A | Discharge status: Home / Self Care | Payer: Medicare HMO | Source: Ambulatory Visit | Attending: Radiation Oncology | Admitting: Radiation Oncology

## 2021-12-18 ENCOUNTER — Other Ambulatory Visit: Payer: Self-pay

## 2021-12-18 DIAGNOSIS — R52 Pain, unspecified: Secondary | ICD-10-CM | POA: Diagnosis not present

## 2021-12-18 DIAGNOSIS — R1310 Dysphagia, unspecified: Secondary | ICD-10-CM | POA: Diagnosis not present

## 2021-12-18 DIAGNOSIS — Z510 Encounter for antineoplastic radiation therapy: Secondary | ICD-10-CM | POA: Diagnosis not present

## 2021-12-18 DIAGNOSIS — C160 Malignant neoplasm of cardia: Secondary | ICD-10-CM | POA: Diagnosis not present

## 2021-12-18 DIAGNOSIS — Z79899 Other long term (current) drug therapy: Secondary | ICD-10-CM | POA: Diagnosis not present

## 2021-12-18 DIAGNOSIS — C161 Malignant neoplasm of fundus of stomach: Secondary | ICD-10-CM | POA: Diagnosis not present

## 2021-12-18 LAB — RAD ONC ARIA SESSION SUMMARY
Course Elapsed Days: 9
Plan Fractions Treated to Date: 8
Plan Prescribed Dose Per Fraction: 1.8 Gy
Plan Total Fractions Prescribed: 25
Plan Total Prescribed Dose: 45 Gy
Reference Point Dosage Given to Date: 14.4 Gy
Reference Point Session Dosage Given: 1.8 Gy
Session Number: 8

## 2021-12-21 ENCOUNTER — Other Ambulatory Visit: Payer: Self-pay

## 2021-12-21 ENCOUNTER — Ambulatory Visit
Admission: RE | Admit: 2021-12-21 | Discharge: 2021-12-21 | Disposition: A | Discharge status: Home / Self Care | Payer: Medicare HMO | Source: Ambulatory Visit | Attending: Radiation Oncology | Admitting: Radiation Oncology

## 2021-12-21 DIAGNOSIS — C160 Malignant neoplasm of cardia: Secondary | ICD-10-CM | POA: Diagnosis not present

## 2021-12-21 DIAGNOSIS — C161 Malignant neoplasm of fundus of stomach: Secondary | ICD-10-CM | POA: Diagnosis not present

## 2021-12-21 DIAGNOSIS — R52 Pain, unspecified: Secondary | ICD-10-CM | POA: Diagnosis not present

## 2021-12-21 DIAGNOSIS — R1310 Dysphagia, unspecified: Secondary | ICD-10-CM | POA: Diagnosis not present

## 2021-12-21 DIAGNOSIS — Z510 Encounter for antineoplastic radiation therapy: Secondary | ICD-10-CM | POA: Diagnosis not present

## 2021-12-21 DIAGNOSIS — Z79899 Other long term (current) drug therapy: Secondary | ICD-10-CM | POA: Diagnosis not present

## 2021-12-21 LAB — RAD ONC ARIA SESSION SUMMARY
Course Elapsed Days: 12
Plan Fractions Treated to Date: 9
Plan Prescribed Dose Per Fraction: 1.8 Gy
Plan Total Fractions Prescribed: 25
Plan Total Prescribed Dose: 45 Gy
Reference Point Dosage Given to Date: 16.2 Gy
Reference Point Session Dosage Given: 1.8 Gy
Session Number: 9

## 2021-12-22 ENCOUNTER — Other Ambulatory Visit (HOSPITAL_COMMUNITY): Payer: Self-pay

## 2021-12-22 ENCOUNTER — Other Ambulatory Visit: Payer: Self-pay | Admitting: Hematology & Oncology

## 2021-12-22 ENCOUNTER — Other Ambulatory Visit: Payer: Self-pay | Admitting: *Deleted

## 2021-12-22 ENCOUNTER — Encounter: Payer: Self-pay | Admitting: Hematology & Oncology

## 2021-12-22 ENCOUNTER — Ambulatory Visit
Admission: RE | Admit: 2021-12-22 | Discharge: 2021-12-22 | Disposition: A | Discharge status: Home / Self Care | Payer: Medicare HMO | Source: Ambulatory Visit | Attending: Radiation Oncology | Admitting: Radiation Oncology

## 2021-12-22 ENCOUNTER — Other Ambulatory Visit: Payer: Self-pay | Admitting: Radiation Oncology

## 2021-12-22 ENCOUNTER — Encounter: Payer: Self-pay | Admitting: *Deleted

## 2021-12-22 ENCOUNTER — Ambulatory Visit: Payer: Medicare HMO

## 2021-12-22 ENCOUNTER — Other Ambulatory Visit: Payer: Self-pay

## 2021-12-22 DIAGNOSIS — C161 Malignant neoplasm of fundus of stomach: Secondary | ICD-10-CM | POA: Diagnosis not present

## 2021-12-22 DIAGNOSIS — C160 Malignant neoplasm of cardia: Secondary | ICD-10-CM | POA: Diagnosis not present

## 2021-12-22 DIAGNOSIS — Z79899 Other long term (current) drug therapy: Secondary | ICD-10-CM | POA: Diagnosis not present

## 2021-12-22 DIAGNOSIS — R1310 Dysphagia, unspecified: Secondary | ICD-10-CM | POA: Diagnosis not present

## 2021-12-22 DIAGNOSIS — Z510 Encounter for antineoplastic radiation therapy: Secondary | ICD-10-CM | POA: Diagnosis not present

## 2021-12-22 DIAGNOSIS — R52 Pain, unspecified: Secondary | ICD-10-CM | POA: Diagnosis not present

## 2021-12-22 LAB — RAD ONC ARIA SESSION SUMMARY
Course Elapsed Days: 13
Plan Fractions Treated to Date: 10
Plan Prescribed Dose Per Fraction: 1.8 Gy
Plan Total Fractions Prescribed: 25
Plan Total Prescribed Dose: 45 Gy
Reference Point Dosage Given to Date: 18 Gy
Reference Point Session Dosage Given: 1.8 Gy
Session Number: 10

## 2021-12-22 MED ORDER — PROCHLORPERAZINE MALEATE 10 MG PO TABS: 10.0000 mg | ORAL_TABLET | Freq: Four times a day (QID) | ORAL | 1 refills | Status: DC | PRN

## 2021-12-22 MED ORDER — ONDANSETRON HCL 4 MG PO TABS
4.0000 mg | ORAL_TABLET | Freq: Once | ORAL | Status: DC
  Filled 2021-12-22: qty 1

## 2021-12-22 MED ORDER — CAPECITABINE 500 MG PO TABS
1500.0000 mg | ORAL_TABLET | Freq: Every day | ORAL | 0 refills | Status: DC
  Filled 2021-12-22: qty 90, 30d supply, fill #0

## 2021-12-22 MED ORDER — LORAZEPAM 0.5 MG PO TABS
0.5000 mg | ORAL_TABLET | Freq: Three times a day (TID) | ORAL | 0 refills | Status: DC
  Filled 2021-12-22: qty 30, 10d supply, fill #0

## 2021-12-22 MED ORDER — LORAZEPAM 0.5 MG PO TABS: 0.5000 mg | ORAL_TABLET | Freq: Three times a day (TID) | ORAL | 0 refills | Status: DC

## 2021-12-22 MED ORDER — SUCRALFATE 1 G PO TABS: 1.0000 g | ORAL_TABLET | Freq: Three times a day (TID) | ORAL | 1 refills | Status: DC

## 2021-12-22 MED ORDER — ONDANSETRON HCL 8 MG PO TABS: 8.0000 mg | ORAL_TABLET | Freq: Three times a day (TID) | ORAL | 0 refills | Status: DC | PRN

## 2021-12-23 ENCOUNTER — Other Ambulatory Visit: Payer: Self-pay

## 2021-12-23 ENCOUNTER — Other Ambulatory Visit (HOSPITAL_COMMUNITY): Payer: Self-pay

## 2021-12-23 ENCOUNTER — Ambulatory Visit
Admission: RE | Admit: 2021-12-23 | Discharge: 2021-12-23 | Disposition: A | Discharge status: Home / Self Care | Payer: Medicare HMO | Source: Ambulatory Visit | Attending: Radiation Oncology | Admitting: Radiation Oncology

## 2021-12-23 DIAGNOSIS — C161 Malignant neoplasm of fundus of stomach: Secondary | ICD-10-CM | POA: Diagnosis not present

## 2021-12-23 DIAGNOSIS — R1310 Dysphagia, unspecified: Secondary | ICD-10-CM | POA: Diagnosis not present

## 2021-12-23 DIAGNOSIS — C160 Malignant neoplasm of cardia: Secondary | ICD-10-CM | POA: Diagnosis not present

## 2021-12-23 DIAGNOSIS — Z510 Encounter for antineoplastic radiation therapy: Secondary | ICD-10-CM | POA: Diagnosis not present

## 2021-12-23 DIAGNOSIS — R52 Pain, unspecified: Secondary | ICD-10-CM | POA: Diagnosis not present

## 2021-12-23 DIAGNOSIS — Z79899 Other long term (current) drug therapy: Secondary | ICD-10-CM | POA: Diagnosis not present

## 2021-12-23 LAB — RAD ONC ARIA SESSION SUMMARY
Course Elapsed Days: 14
Plan Fractions Treated to Date: 11
Plan Prescribed Dose Per Fraction: 1.8 Gy
Plan Total Fractions Prescribed: 25
Plan Total Prescribed Dose: 45 Gy
Reference Point Dosage Given to Date: 19.8 Gy
Reference Point Session Dosage Given: 1.8 Gy
Session Number: 11

## 2021-12-24 ENCOUNTER — Other Ambulatory Visit (HOSPITAL_COMMUNITY): Payer: Self-pay

## 2021-12-24 ENCOUNTER — Other Ambulatory Visit: Payer: Self-pay

## 2021-12-24 ENCOUNTER — Ambulatory Visit
Admission: RE | Admit: 2021-12-24 | Discharge: 2021-12-24 | Disposition: A | Discharge status: Home / Self Care | Payer: Medicare HMO | Source: Ambulatory Visit | Attending: Radiation Oncology | Admitting: Radiation Oncology

## 2021-12-24 DIAGNOSIS — Z510 Encounter for antineoplastic radiation therapy: Secondary | ICD-10-CM | POA: Diagnosis not present

## 2021-12-24 DIAGNOSIS — Z79899 Other long term (current) drug therapy: Secondary | ICD-10-CM | POA: Diagnosis not present

## 2021-12-24 DIAGNOSIS — R52 Pain, unspecified: Secondary | ICD-10-CM | POA: Diagnosis not present

## 2021-12-24 DIAGNOSIS — R1310 Dysphagia, unspecified: Secondary | ICD-10-CM | POA: Diagnosis not present

## 2021-12-24 DIAGNOSIS — C161 Malignant neoplasm of fundus of stomach: Secondary | ICD-10-CM | POA: Diagnosis not present

## 2021-12-24 DIAGNOSIS — C160 Malignant neoplasm of cardia: Secondary | ICD-10-CM | POA: Diagnosis not present

## 2021-12-24 LAB — RAD ONC ARIA SESSION SUMMARY
Course Elapsed Days: 15
Plan Fractions Treated to Date: 12
Plan Prescribed Dose Per Fraction: 1.8 Gy
Plan Total Fractions Prescribed: 25
Plan Total Prescribed Dose: 45 Gy
Reference Point Dosage Given to Date: 21.6 Gy
Reference Point Session Dosage Given: 1.8 Gy
Session Number: 12

## 2021-12-25 ENCOUNTER — Inpatient Hospital Stay (HOSPITAL_BASED_OUTPATIENT_CLINIC_OR_DEPARTMENT_OTHER): Payer: Medicare HMO | Admitting: Hematology & Oncology

## 2021-12-25 ENCOUNTER — Other Ambulatory Visit: Payer: Self-pay

## 2021-12-25 ENCOUNTER — Inpatient Hospital Stay: Payer: Medicare HMO

## 2021-12-25 ENCOUNTER — Ambulatory Visit
Admission: RE | Admit: 2021-12-25 | Discharge: 2021-12-25 | Disposition: A | Discharge status: Home / Self Care | Payer: Medicare HMO | Source: Ambulatory Visit | Attending: Radiation Oncology | Admitting: Radiation Oncology

## 2021-12-25 ENCOUNTER — Encounter: Payer: Self-pay | Admitting: Hematology & Oncology

## 2021-12-25 VITALS — BP 103/58 | HR 65 | Resp 17

## 2021-12-25 VITALS — BP 118/55 | HR 75 | Temp 98.1°F | Resp 19 | Ht 69.0 in | Wt 153.8 lb

## 2021-12-25 DIAGNOSIS — Z79899 Other long term (current) drug therapy: Secondary | ICD-10-CM | POA: Insufficient documentation

## 2021-12-25 DIAGNOSIS — E860 Dehydration: Secondary | ICD-10-CM | POA: Insufficient documentation

## 2021-12-25 DIAGNOSIS — R1310 Dysphagia, unspecified: Secondary | ICD-10-CM | POA: Insufficient documentation

## 2021-12-25 DIAGNOSIS — C160 Malignant neoplasm of cardia: Secondary | ICD-10-CM

## 2021-12-25 DIAGNOSIS — C161 Malignant neoplasm of fundus of stomach: Secondary | ICD-10-CM | POA: Diagnosis not present

## 2021-12-25 DIAGNOSIS — C169 Malignant neoplasm of stomach, unspecified: Secondary | ICD-10-CM | POA: Insufficient documentation

## 2021-12-25 DIAGNOSIS — R52 Pain, unspecified: Secondary | ICD-10-CM | POA: Insufficient documentation

## 2021-12-25 DIAGNOSIS — M79604 Pain in right leg: Secondary | ICD-10-CM

## 2021-12-25 DIAGNOSIS — Z510 Encounter for antineoplastic radiation therapy: Secondary | ICD-10-CM | POA: Diagnosis not present

## 2021-12-25 LAB — CMP (CANCER CENTER ONLY)
ALT: 7 U/L (ref 0–44)
AST: 10 U/L — ABNORMAL LOW (ref 15–41)
Albumin: 4.2 g/dL (ref 3.5–5.0)
Alkaline Phosphatase: 67 U/L (ref 38–126)
Anion gap: 6 (ref 5–15)
BUN: 16 mg/dL (ref 8–23)
CO2: 30 mmol/L (ref 22–32)
Calcium: 9.5 mg/dL (ref 8.9–10.3)
Chloride: 102 mmol/L (ref 98–111)
Creatinine: 1.24 mg/dL (ref 0.61–1.24)
GFR, Estimated: 59 mL/min — ABNORMAL LOW (ref >60)
Glucose, Bld: 86 mg/dL (ref 70–99)
Potassium: 3.8 mmol/L (ref 3.5–5.1)
Sodium: 138 mmol/L (ref 135–145)
Total Bilirubin: 0.5 mg/dL (ref 0.3–1.2)
Total Protein: 6.4 g/dL — ABNORMAL LOW (ref 6.5–8.1)

## 2021-12-25 LAB — RAD ONC ARIA SESSION SUMMARY
Course Elapsed Days: 16
Plan Fractions Treated to Date: 13
Plan Prescribed Dose Per Fraction: 1.8 Gy
Plan Total Fractions Prescribed: 25
Plan Total Prescribed Dose: 45 Gy
Reference Point Dosage Given to Date: 23.4 Gy
Reference Point Session Dosage Given: 1.8 Gy
Session Number: 13

## 2021-12-25 LAB — CBC WITH DIFFERENTIAL (CANCER CENTER ONLY)
Abs Immature Granulocytes: 0.01 10*3/uL (ref 0.00–0.07)
Basophils Absolute: 0 10*3/uL (ref 0.0–0.1)
Basophils Relative: 1 %
Eosinophils Absolute: 0.2 10*3/uL (ref 0.0–0.5)
Eosinophils Relative: 5 %
HCT: 38.3 % — ABNORMAL LOW (ref 39.0–52.0)
Hemoglobin: 12.3 g/dL — ABNORMAL LOW (ref 13.0–17.0)
Immature Granulocytes: 0 %
Lymphocytes Relative: 9 %
Lymphs Abs: 0.4 10*3/uL — ABNORMAL LOW (ref 0.7–4.0)
MCH: 30.2 pg (ref 26.0–34.0)
MCHC: 32.1 g/dL (ref 30.0–36.0)
MCV: 94.1 fL (ref 80.0–100.0)
Monocytes Absolute: 0.8 10*3/uL (ref 0.1–1.0)
Monocytes Relative: 18 %
Neutro Abs: 3 10*3/uL (ref 1.7–7.7)
Neutrophils Relative %: 67 %
Platelet Count: 260 10*3/uL (ref 150–400)
RBC: 4.07 MIL/uL — ABNORMAL LOW (ref 4.22–5.81)
RDW: 15 % (ref 11.5–15.5)
WBC Count: 4.4 10*3/uL (ref 4.0–10.5)
nRBC: 0 % (ref 0.0–0.2)

## 2021-12-25 LAB — FERRITIN: Ferritin: 107 ng/mL (ref 24–336)

## 2021-12-25 LAB — IRON AND IRON BINDING CAPACITY (CC-WL,HP ONLY)
Iron: 116 ug/dL (ref 45–182)
Saturation Ratios: 39 % (ref 17.9–39.5)
TIBC: 300 ug/dL (ref 250–450)
UIBC: 184 ug/dL (ref 117–376)

## 2021-12-25 LAB — LACTATE DEHYDROGENASE: LDH: 94 U/L — ABNORMAL LOW (ref 98–192)

## 2021-12-25 MED ORDER — LORAZEPAM 2 MG/ML IJ SOLN
1.0000 mg | INTRAMUSCULAR | Status: AC
  Administered 2021-12-25: 1 mg via INTRAVENOUS
  Filled 2021-12-25: qty 1

## 2021-12-25 MED ORDER — SODIUM CHLORIDE 0.9 % IV SOLN
40.0000 mg | Freq: Once | INTRAVENOUS | Status: DC
  Administered 2021-12-25: 40 mg via INTRAVENOUS
  Filled 2021-12-25: qty 4

## 2021-12-25 MED ORDER — HYDROMORPHONE HCL 1 MG/ML IJ SOLN
1.0000 mg | INTRAMUSCULAR | Status: AC
  Administered 2021-12-25: 1 mg via INTRAVENOUS
  Filled 2021-12-25: qty 1

## 2021-12-25 MED ORDER — LORAZEPAM 2 MG/ML IJ SOLN: 1.0000 mg | Freq: Once | INTRAMUSCULAR | Status: DC

## 2021-12-25 MED ORDER — SODIUM CHLORIDE 0.9 % IV SOLN: INTRAVENOUS | Status: DC

## 2021-12-25 MED ORDER — HYDROCODONE-ACETAMINOPHEN 7.5-325 MG/15ML PO SOLN: 10.0000 mL | Freq: Four times a day (QID) | ORAL | 0 refills | Status: DC | PRN

## 2021-12-25 NOTE — Progress Notes (Signed)
Hematology and Oncology Follow Up Visit  George Dyer [id number] [birth year] 81 y.o. 12/25/2021   Principle Diagnosis:  Stage IIB (T3N0M0) Gastric cancer -- declines surgery  Current Therapy:   FOLFOX - s/p cycle #4-  start on 09/15/2021 XRT/Xeloda -- start on 12/07/2021     Interim History:  George Dyer is back for follow-up.  George Dyer is really having a hard time with radiation therapy.  George Dyer is having a hard time swallowing.  George Dyer has had nausea and vomiting.  George Dyer has had some pain.  His blood pressure is on the lower side.  I told him to stop the Zestoretic for right now.  I am not surprised by all this.  George Dyer is about halfway through his radiation treatments.  I went to stop the Xeloda for 1 week.  I do think this would be a bad idea and I do not think it would be negative with respect to his treatment.  George Dyer is dehydrated.  George Dyer will get some IV fluids in the office today.  George Dyer is not having any diarrhea.  There is no bleeding.  George Dyer has had no cough or shortness of breath.  Have I will try him on some hydrocodone liquid to see if this may help with some of the discomfort.  George Dyer has had no fever.  There is no leg swelling.  George Dyer has had no rashes.  George Dyer stopped his Requip and the Synthroid.  I told him that George Dyer has to take the Synthroid.  George Dyer understands this.  Overall, I was his performance status is probably ECOG 2   Medications:  Current Outpatient Medications:    capecitabine (XELODA) 500 MG tablet, Take 3 tablets (1,500 mg total) by mouth daily. Take within 30 minutes after a meal. Start 2 days BEFORE radiation starts!!! Take on days of radiation thereafter., Disp: 90 tablet, Rfl: 0   lisinopril-hydrochlorothiazide (ZESTORETIC) 20-12.5 MG tablet, Take 1 tablet by mouth daily., Disp: 90 tablet, Rfl: 3   omeprazole (PRILOSEC) 40 MG capsule, Take 1 capsule (40 mg total) by mouth 2 (two) times daily before a meal., Disp: 60 capsule, Rfl: 12   polyethylene glycol (MIRALAX / GLYCOLAX) 17 g  packet, Take 17 g by mouth daily., Disp: , Rfl:    prochlorperazine (COMPAZINE) 10 MG tablet, Take 1 tablet (10 mg total) by mouth every 6 (six) hours as needed (Nausea or vomiting)., Disp: 30 tablet, Rfl: 1   traMADol (ULTRAM) 50 MG tablet, Take 50 mg by mouth every 6 (six) hours as needed., Disp: , Rfl:    acetaminophen (TYLENOL) 650 MG CR tablet, Take 1,300 mg by mouth 2 (two) times daily. (Patient not taking: Reported on 12/25/2021), Disp: , Rfl:    alum & mag hydroxide-simeth (MAALOX/MYLANTA) [phone] MG/5ML suspension, Take 30 mLs by mouth daily. (Patient not taking: Reported on 12/25/2021), Disp: , Rfl:    gabapentin (NEURONTIN) 300 MG capsule, Take 1 capsule (300 mg total) by mouth 3 (three) times daily. (Patient not taking: Reported on 12/02/2021), Disp: 90 capsule, Rfl: 3   Heparin Na, Pork, Lock Flsh PF 100 UNIT/ML SOLN, Dispense 24 syringes with 2.51m dose (discard remainder). Flush each PICC lumen Monday, Wednesday, and Friday., Disp: 120 mL, Rfl: 1   levothyroxine (SYNTHROID) 50 MCG tablet, Take 50 mcg by mouth daily. (Patient not taking: Reported on 12/02/2021), Disp: , Rfl:    LORazepam (ATIVAN) 0.5 MG tablet, Take 1 tablet (0.5 mg total) by mouth every 8 (eight) hours. (Patient not taking: Reported on  12/25/2021), Disp: 30 tablet, Rfl: 0   ondansetron (ZOFRAN) 8 MG tablet, Take 1 tablet (8 mg total) by mouth every 8 (eight) hours as needed for nausea or vomiting. (Patient not taking: Reported on 12/25/2021), Disp: 20 tablet, Rfl: 0   rOPINIRole (REQUIP) 2 MG tablet, Take 2 mg by mouth at bedtime. (Patient not taking: Reported on 12/02/2021), Disp: , Rfl:    sodium chloride flush (NS) 0.9 % SOLN, Inject 10 mLs into each port 3 (three) times a week. Flush each port of PICC Monday, Wednesday and Friday., Disp: 240 mL, Rfl: 1   sucralfate (CARAFATE) 1 g tablet, Take 1 tablet (1 g total) by mouth 4 (four) times daily -  with meals and at bedtime. Crush and dissolve and 10 mL of warm water prior to  swallowing, take 20 minutes prior to meals (Patient not taking: Reported on 12/25/2021), Disp: 120 tablet, Rfl: 1 No current facility-administered medications for this visit.  Facility-Administered Medications Ordered in Other Visits:    heparin lock flush 100 unit/mL, 500 Units, Intravenous, Once, [name], Rudell Cobb, MD   HYDROcodone-acetaminophen (NORCO/VICODIN) 5-325 MG per tablet 1 tablet, 1 tablet, Oral, Once, Celso Amy, NP   sodium chloride flush (NS) 0.9 % injection 10 mL, 10 mL, Intravenous, Once, [name], Rudell Cobb, MD  Allergies:  Allergies  Allergen Reactions   Percocet [Oxycodone-Acetaminophen] Nausea And Vomiting   Oxycodone-Aspirin Nausea And Vomiting    Past Medical History, Surgical history, Social history, and Family History were reviewed and updated.  Review of Systems: Review of Systems  Constitutional:  Positive for fatigue.  HENT:  Negative.    Eyes: Negative.   Respiratory: Negative.    Cardiovascular: Negative.   Gastrointestinal:  Positive for nausea.  Endocrine: Negative.   Genitourinary: Negative.    Musculoskeletal: Negative.   Skin: Negative.   Neurological: Negative.   Hematological: Negative.   Psychiatric/Behavioral: Negative.      Physical Exam:  height is '5\' 9"'$  (1.753 m) and weight is 153 lb 12.8 oz (69.8 kg). His oral temperature is 98.1 F (36.7 C). His blood pressure is 118/55 (abnormal) and his pulse is 75. His respiration is 19 and oxygen saturation is 98%.   Wt Readings from Last 3 Encounters:  12/25/21 153 lb 12.8 oz (69.8 kg)  12/02/21 156 lb (70.8 kg)  11/23/21 158 lb (71.7 kg)    Physical Exam Vitals reviewed.  HENT:     Head: Normocephalic and atraumatic.  Eyes:     Pupils: Pupils are equal, round, and reactive to light.  Cardiovascular:     Rate and Rhythm: Normal rate and regular rhythm.     Heart sounds: Normal heart sounds.  Pulmonary:     Effort: Pulmonary effort is normal.     Breath sounds: Normal breath  sounds.  Abdominal:     General: Bowel sounds are normal.     Palpations: Abdomen is soft.  Musculoskeletal:        General: No tenderness or deformity. Normal range of motion.     Cervical back: Normal range of motion.  Lymphadenopathy:     Cervical: No cervical adenopathy.  Skin:    General: Skin is warm and dry.     Findings: No erythema or rash.  Neurological:     Mental Status: George Dyer is alert and oriented to person, place, and time.  Psychiatric:        Behavior: Behavior normal.        Thought Content: Thought content normal.  Judgment: Judgment normal.    Lab Results  Component Value Date   WBC 4.4 12/25/2021   HGB 12.3 (L) 12/25/2021   HCT 38.3 (L) 12/25/2021   MCV 94.1 12/25/2021   PLT 260 12/25/2021     Chemistry      Component Value Date/Time   NA 138 12/25/2021 0903   NA 139 07/23/2020 0818   K 3.8 12/25/2021 0903   CL 102 12/25/2021 0903   CO2 30 12/25/2021 0903   BUN 16 12/25/2021 0903   BUN 10 07/23/2020 0818   CREATININE 1.24 12/25/2021 0903   CREATININE 1.06 06/27/2015 0837      Component Value Date/Time   CALCIUM 9.5 12/25/2021 0903   ALKPHOS 67 12/25/2021 0903   AST 10 (L) 12/25/2021 0903   ALT 7 12/25/2021 0903   BILITOT 0.5 12/25/2021 0903      Impression and Plan: George Dyer is a very nice 81 year old white male.  George Dyer has what I hope to believe localized gastric cancer.   George Dyer has had 4 cycles of chemotherapy.  Again, does not look that the chemotherapy has been all that effective.  George Dyer now is on radiation therapy with Xeloda.  George Dyer is about halfway through.  Again George Dyer is having the problems which are not surprising.  I am sure George Dyer has some element of gastritis/esophagitis from the radiation.  George Dyer will get IV fluids in the office.  I will give him some IV Pepcid and maybe a little bit of IV Ativan.  Again we will get a stop the Xeloda for 1 week.  I told him that when George Dyer restarts the Xeloda, to start taking 2 pills a day.  I just feel  bad that George Dyer is having the side effects.  Again I really should not be all that surprised.  Hopefully, what we are doing with him is going to help with the cancer.  I would like to see him back probably in about a week or so.  We will  have to follow him closely.   Volanda Napoleon, MD 7/21/202310:29 AM

## 2021-12-25 NOTE — Patient Instructions (Addendum)
Hydromorphone Injection What is this medication? HYDROMORPHONE (hye droe MOR fone) treats severe pain. It is prescribed when other pain medications have not worked or cannot be tolerated. It works by blocking pain signals in the brain. It belongs to a group of medications called opioids. This medicine may be used for other purposes; ask your health care provider or pharmacist if you have questions. COMMON BRAND NAME(S): Dilaudid, Dilaudid-HP, Simplist Dilaudid What should I tell my care team before I take this medication? They need to know if you have any of these conditions: Brain tumor Drug abuse or addiction Head injury Heart disease If you often drink alcohol Kidney disease Liver disease Lung disease, asthma, or breathing problems Seizures Stomach or intestinal problems Taken an MAOI like Marplan, Nardil, or Parnate in the last 14 days An unusual or allergic reaction to hydromorphone, other medicines, foods, dyes, or preservatives Pregnant or trying to get pregnant Breast-feeding How should I use this medication? This medication is for injection into a vein, into a muscle, or under the skin. This medication is given in a hospital or clinic. In rare cases, you might get this medication at home. You will be taught how to give this medication. Use exactly as directed. Take your medication at regular intervals. Do not take your medication more often than directed. It is important that you put your used needles and syringes in a special sharps container. Do not put them in a trash can. If you do not have a sharps container, call your pharmacist or care team to get one. Talk to your care team about the use of this medication in children. Special care may be needed. Overdosage: If you think you have taken too much of this medicine contact a poison control center or emergency room at once. NOTE: This medicine is only for you. Do not share this medicine with others. What if I miss a dose? If  you miss a dose, use it as soon as you can. If it is almost time for your next dose, use only that dose. Do not use double or extra doses. What may interact with this medication? This medication may interact with the following: Alcohol Antihistamines for allergy, cough and cold Certain medications for anxiety or sleep Certain medications for depression like amitriptyline, fluoxetine, sertraline Certain medications for seizures like phenobarbital, primidone General anesthetics like halothane, isoflurane, methoxyflurane, propofol Local anesthetics like lidocaine, pramoxine, tetracaine MAOIs like Carbex, Eldepryl, Marplan, Nardil, and Parnate Medications that relax muscles for surgery Other narcotic medications for pain or cough Phenothiazines like chlorpromazine, mesoridazine, prochlorperazine, thioridazine This list may not describe all possible interactions. Give your health care provider a list of all the medicines, herbs, non-prescription drugs, or dietary supplements you use. Also tell them if you smoke, drink alcohol, or use illegal drugs. Some items may interact with your medicine. What should I watch for while using this medication? Tell your care team if your pain does not go away, if it gets worse, or if you have new or a different type of pain. You may develop tolerance to this medication. Tolerance means that you will need a higher dose of the medication for pain relief. Tolerance is normal and is expected if you take this medication for a long time. There are different types of narcotic medications (opioids) for pain. If you take more than one type at the same time, you may have more side effects. Give your care team a list of all medications you use. He or she  will tell you how much medication to take. Do not take more medication than directed. Get emergency help right away if you have problems breathing. Do not suddenly stop taking your medication because you may develop a severe  reaction. Your body becomes used to the medication. This does NOT mean you are addicted. Addiction is a behavior related to getting and using a medication for a nonmedical reason. If you have pain, you have a medical reason to take pain medication. Your care team will tell you how much medication to take. If your care team wants you to stop the medication, the dose will be slowly lowered over time to avoid any side effects. Talk to your care team about naloxone and how to get it. Naloxone is an emergency medication used for an opioid overdose. An overdose can happen if you take too much opioid. It can also happen if an opioid is taken with some other medications or substances, like alcohol. Know the symptoms of an overdose, like trouble breathing, unusually tired or sleepy, or not being able to respond or wake up. Make sure to tell caregivers and close contacts where it is stored. Make sure they know how to use it. After naloxone is given, you must get emergency help right away. Naloxone is a temporary treatment. Repeat doses may be needed. You may get drowsy or dizzy. Do not drive, use machinery, or do anything that needs mental alertness until you know how this medication affects you. Do not stand up or sit up quickly, especially if you are an older patient. This reduces the risk of dizzy or fainting spells. Alcohol may interfere with the effect of this medication. Avoid alcoholic drinks. This medication will cause constipation. If you do not have a bowel movement for 3 days, call your care team. Your mouth may get dry. Chewing sugarless gum or sucking hard candy and drinking plenty of water may help. Contact your care team if the problem does not go away or is severe. What side effects may I notice from receiving this medication? Side effects that you should report to your care team as soon as possible: Allergic reactions--skin rash, itching, hives, swelling of the face, lips, tongue, or throat CNS  depression--slow or shallow breathing, shortness of breath, feeling faint, dizziness, confusion, trouble staying awake Low adrenal gland function--nausea, vomiting, loss of appetite, unusual weakness or fatigue, dizziness Low blood pressure--dizziness, feeling faint or lightheaded, blurry vision Side effects that usually do not require medical attention (report to your care team if they continue or are bothersome): Constipation Dizziness Drowsiness Dry mouth Headache Nausea Vomiting This list may not describe all possible side effects. Call your doctor for medical advice about side effects. You may report side effects to FDA at 1-800-FDA-1088. Where should I keep my medication? Keep out of the reach of children and pets. This medication can be abused. Keep your medication in a safe place to protect it from theft. Do not share this medication with anyone. Selling or giving away this medication is dangerous and against the law. If you are using this medication at home, you will be instructed on how to store this medication. This medication may cause accidental overdose and death if it is taken by other adults, children, or pets. Flush any unused medication down the toilet to reduce the chance of harm. Do not use the medication after the expiration date. NOTE: This sheet is a summary. It may not cover all possible information. If you have questions about  this medicine, talk to your doctor, pharmacist, or health care provider.  2023 Elsevier/Gold Standard (2020-12-19 00:00:00)   Famotidine Solution for Injection What is this medication? FAMOTIDINE (fa MOE ti deen) treats stomach ulcers, reflux disease, or other conditions that cause too much stomach acid. It works by reducing the amount of acid in the stomach. This medicine may be used for other purposes; ask your health care provider or pharmacist if you have questions. COMMON BRAND NAME(S): Pepcid What should I tell my care team before I take  this medication? They need to know if you have any of these conditions: Kidney or liver disease An unusual or allergic reaction to famotidine, other medications, foods, dyes, or preservatives Pregnant or trying to get pregnant Breast-feeding How should I use this medication? This medication is for infusion into a vein. It is given in a hospital or clinic setting. Talk to your care team regarding the use of this medication in children. Special care may be needed. Overdosage: If you think you have taken too much of this medicine contact a poison control center or emergency room at once. NOTE: This medicine is only for you. Do not share this medicine with others. What if I miss a dose? This does not apply. What may interact with this medication? Delavirdine Itraconazole Ketoconazole This list may not describe all possible interactions. Give your health care provider a list of all the medicines, herbs, non-prescription drugs, or dietary supplements you use. Also tell them if you smoke, drink alcohol, or use illegal drugs. Some items may interact with your medicine. What should I watch for while using this medication? Tell your doctor or health care professional if your condition does not start to get better or gets worse. Do not take with aspirin, ibuprofen, or other antiinflammatory medications. These can aggravate your condition. Do not smoke cigarettes or drink alcohol. These increase irritation in your stomach and can increase the time it will take for ulcers to heal. Cigarettes and alcohol can also worsen acid reflux or heartburn. If you get black, tarry stools or vomit up what looks like coffee grounds, call your doctor or health care professional at once. You may have a bleeding ulcer. This medication may cause a decrease in vitamin B12. Make sure that you get enough vitamin B12 while you are taking this medication. Discuss the foods you eat and the vitamins you take with your care  team. What side effects may I notice from receiving this medication? Side effects that you should report to your care team as soon as possible: Allergic reactions--skin rash, itching, hives, swelling of the face, lips, tongue, or throat Confusion Hallucinations Side effects that usually do not require medical attention (report to your care team if they continue or are bothersome): Constipation Diarrhea Dizziness Headache This list may not describe all possible side effects. Call your doctor for medical advice about side effects. You may report side effects to FDA at 1-800-FDA-1088. Where should I keep my medication? This medication is given in a hospital or clinic. You will not be given this medication to store at home. NOTE: This sheet is a summary. It may not cover all possible information. If you have questions about this medicine, talk to your doctor, pharmacist, or health care provider.  2023 Elsevier/Gold Standard (2020-05-27 00:00:00)  Lorazepam Injection What is this medication? LORAZEPAM (lor A ze pam) treats seizures. It is also used to treat anxiety, including before a procedure. It works by Child psychotherapist system slow down.  It belongs to a group of medications called benzodiazepines. This medicine may be used for other purposes; ask your health care provider or pharmacist if you have questions. COMMON BRAND NAME(S): Ativan What should I tell my care team before I take this medication? They need to know if you have any of these conditions: Glaucoma History of drug or alcohol abuse problem Kidney disease Liver disease Lung or breathing disease, like asthma Mental illness Myasthenia gravis Parkinson disease Suicidal thoughts, plans, or attempt; a previous suicide attempt by you or a family member An unusual or allergic reaction to lorazepam, other medications, foods, dyes, or preservatives Pregnant or trying to get pregnant Breast-feeding How should I use this  medication? This medication is for injection into a muscle or into a vein. It is given in a hospital or clinic. Talk to your care team regarding the use of this medication in children. Special care may be needed. Overdosage: If you think you have taken too much of this medicine contact a poison control center or emergency room at once. NOTE: This medicine is only for you. Do not share this medicine with others. What if I miss a dose? This does not apply. What may interact with this medication? Do not take this medication with any of the following: Narcotic medications for cough Sodium oxybate This medication may also interact with the following: Alcohol Antihistamines for allergy, cough and cold Certain medications for anxiety or sleep Certain medications for depression, like amitriptyline, fluoxetine, sertraline Certain medications for seizures like carbamazepine, phenobarbital, phenytoin, primidone General anesthetics like lidocaine, pramoxine, tetracaine MAOIs like Carbex, Eldepryl, Marplan, Nardil, and Parnate Medications that relax muscles for surgery Narcotic medications for pain Phenothiazines like chlorpromazine, mesoridazine, prochlorperazine, thioridazine This list may not describe all possible interactions. Give your health care provider a list of all the medicines, herbs, non-prescription drugs, or dietary supplements you use. Also tell them if you smoke, drink alcohol, or use illegal drugs. Some items may interact with your medicine. What should I watch for while using this medication? Tell your care team if your symptoms do not start to get better or if they get worse. You may get drowsy or dizzy. Do not drive, use machinery, or do anything that needs mental alertness until you know how this medication affects you. Do not stand or sit up quickly, especially if you are an older patient. This reduces the risk of dizzy or fainting spells. Alcohol may interfere with the effect of  this medication. Avoid alcoholic drinks. If you are taking another medication that also causes drowsiness, you may have more side effects. Give your care team a list of all medications you use. Your care team will tell you how much medication to take. Do not take more medication than directed. Call emergency services for help if you have problems breathing or unusual sleepiness. What side effects may I notice from receiving this medication? Side effects that you should report to your care team as soon as possible: Allergic reactions--skin rash, itching, hives, swelling of the face, lips, tongue, or throat CNS depression--slow or shallow breathing, shortness of breath, feeling faint, dizziness, confusion, difficulty staying awake Thoughts of suicide or self-harm, worsening mood, feelings of depression Side effects that usually do not require medical attention (report to your care team if they continue or are bothersome): Dizziness Drowsiness Headache Nausea Vomiting This list may not describe all possible side effects. Call your doctor for medical advice about side effects. You may report side effects to FDA  at 1-800-FDA-1088. Where should I keep my medication? This medication will be given to you in a hospital or clinic. You will not be given this medication to take home. NOTE: This sheet is a summary. It may not cover all possible information. If you have questions about this medicine, talk to your doctor, pharmacist, or health care provider.  2023 Elsevier/Gold Standard (2020-05-19 00:00:00) Dehydration, Adult Dehydration is a condition in which there is not enough water or other fluids in the body. This happens when a person loses more fluids than he or she takes in. Important organs, such as the kidneys, brain, and heart, cannot function without a proper amount of fluids. Any loss of fluids from the body can lead to dehydration. Dehydration can be mild, moderate, or severe. It should be  treated right away to prevent it from becoming severe. What are the causes? Dehydration may be caused by: Conditions that cause loss of water or other fluids, such as diarrhea, vomiting, or sweating or urinating a lot. Not drinking enough fluids, especially when you are ill or doing activities that require a lot of energy. Other illnesses and conditions, such as fever or infection. Certain medicines, such as medicines that remove excess fluid from the body (diuretics). Lack of safe drinking water. Not being able to get enough water and food. What increases the risk? The following factors may make you more likely to develop this condition: Having a long-term (chronic) illness that has not been treated properly, such as diabetes, heart disease, or kidney disease. Being 39 years of age or older. Having a disability. Living in a place that is high in altitude, where thinner, drier air causes more fluid loss. Doing exercises that put stress on your body for a long time (endurance sports). What are the signs or symptoms? Symptoms of dehydration depend on how severe it is. Mild or moderate dehydration Thirst. Dry lips or dry mouth. Dizziness or light-headedness, especially when standing up from a seated position. Muscle cramps. Dark urine. Urine may be the color of tea. Less urine or tears produced than usual. Headache. Severe dehydration Changes in skin. Your skin may be cold and clammy, blotchy, or pale. Your skin also may not return to normal after being lightly pinched and released. Little or no tears, urine, or sweat. Changes in vital signs, such as rapid breathing and low blood pressure. Your pulse may be weak or may be faster than 100 beats a minute when you are sitting still. Other changes, such as: Feeling very thirsty. Sunken eyes. Cold hands and feet. Confusion. Being very tired (lethargic) or having trouble waking from sleep. Short-term weight loss. Loss of  consciousness. How is this diagnosed? This condition is diagnosed based on your symptoms and a physical exam. You may have blood and urine tests to help confirm the diagnosis. How is this treated? Treatment for this condition depends on how severe it is. Treatment should be started right away. Do not wait until dehydration becomes severe. Severe dehydration is an emergency and needs to be treated in a hospital. Mild or moderate dehydration can be treated at home. You may be asked to: Drink more fluids. Drink an oral rehydration solution (ORS). This drink helps restore proper amounts of fluids and salts and minerals in the blood (electrolytes). Severe dehydration can be treated: With IV fluids. By correcting abnormal levels of electrolytes. This is often done by giving electrolytes through a tube that is passed through your nose and into your stomach (nasogastric tube,  or NG tube). By treating the underlying cause of dehydration. Follow these instructions at home: Oral rehydration solution If told by your health care provider, drink an ORS: Make an ORS by following instructions on the package. Start by drinking small amounts, about  cup (120 mL) every 5-10 minutes. Slowly increase how much you drink until you have taken the amount recommended by your health care provider. Eating and drinking        Drink enough clear fluid to keep your urine pale yellow. If you were told to drink an ORS, finish the ORS first and then start slowly drinking other clear fluids. Drink fluids such as: Water. Do not drink only water. Doing that can lead to hyponatremia, which is having too little salt (sodium) in the body. Water from ice chips you suck on. Fruit juice that you have added water to (diluted fruit juice). Low-calorie sports drinks. Eat foods that contain a healthy balance of electrolytes, such as bananas, oranges, potatoes, tomatoes, and spinach. Do not drink alcohol. Avoid the  following: Drinks that contain a lot of sugar. These include high-calorie sports drinks, fruit juice that is not diluted, and soda. Caffeine. Foods that are greasy or contain a lot of fat or sugar. General instructions Take over-the-counter and prescription medicines only as told by your health care provider. Do not take sodium tablets. Doing that can lead to having too much sodium in the body (hypernatremia). Return to your normal activities as told by your health care provider. Ask your health care provider what activities are safe for you. Keep all follow-up visits as told by your health care provider. This is important. Contact a health care provider if: You have muscle cramps, pain, or discomfort, such as: Pain in your abdomen and the pain gets worse or stays in one area (localizes). Stiff neck. You have a rash. You are more irritable than usual. You are sleepier or have a harder time waking than usual. You feel weak or dizzy. You feel very thirsty. Get help right away if you have: Any symptoms of severe dehydration. Symptoms of vomiting, such as: You cannot eat or drink without vomiting. Vomiting gets worse or does not go away. Vomit includes blood or green matter (bile). Symptoms that get worse with treatment. A fever. A severe headache. Problems with urination or bowel movements, such as: Diarrhea that gets worse or does not go away. Blood in your stool (feces). This may cause stool to look black and tarry. Not urinating, or urinating only a small amount of very dark urine, within 6-8 hours. Trouble breathing. These symptoms may represent a serious problem that is an emergency. Do not wait to see if the symptoms will go away. Get medical help right away. Call your local emergency services (911 in the [place] Do not drive yourself to the hospital. Summary Dehydration is a condition in which there is not enough water or other fluids in the body. This happens when a person loses  more fluids than he or she takes in. Treatment for this condition depends on how severe it is. Treatment should be started right away. Do not wait until dehydration becomes severe. Drink enough clear fluid to keep your urine pale yellow. If you were told to drink an oral rehydration solution (ORS), finish the ORS first and then start slowly drinking other clear fluids. Take over-the-counter and prescription medicines only as told by your health care provider. Get help right away if you have any symptoms of severe dehydration.  This information is not intended to replace advice given to you by your health care provider. Make sure you discuss any questions you have with your health care provider. Document Revised: 01/04/2019 Document Reviewed: 01/04/2019 Elsevier Patient Education  [employer].

## 2021-12-28 ENCOUNTER — Other Ambulatory Visit: Payer: Self-pay

## 2021-12-28 ENCOUNTER — Ambulatory Visit
Admission: RE | Admit: 2021-12-28 | Discharge: 2021-12-28 | Disposition: A | Discharge status: Home / Self Care | Payer: Medicare HMO | Source: Ambulatory Visit | Attending: Radiation Oncology | Admitting: Radiation Oncology

## 2021-12-28 DIAGNOSIS — C160 Malignant neoplasm of cardia: Secondary | ICD-10-CM | POA: Diagnosis not present

## 2021-12-28 DIAGNOSIS — R1310 Dysphagia, unspecified: Secondary | ICD-10-CM | POA: Diagnosis not present

## 2021-12-28 DIAGNOSIS — C161 Malignant neoplasm of fundus of stomach: Secondary | ICD-10-CM | POA: Diagnosis not present

## 2021-12-28 DIAGNOSIS — R52 Pain, unspecified: Secondary | ICD-10-CM | POA: Diagnosis not present

## 2021-12-28 DIAGNOSIS — Z79899 Other long term (current) drug therapy: Secondary | ICD-10-CM | POA: Diagnosis not present

## 2021-12-28 DIAGNOSIS — Z510 Encounter for antineoplastic radiation therapy: Secondary | ICD-10-CM | POA: Diagnosis not present

## 2021-12-28 LAB — RAD ONC ARIA SESSION SUMMARY
Course Elapsed Days: 19
Plan Fractions Treated to Date: 14
Plan Prescribed Dose Per Fraction: 1.8 Gy
Plan Total Fractions Prescribed: 25
Plan Total Prescribed Dose: 45 Gy
Reference Point Dosage Given to Date: 25.2 Gy
Reference Point Session Dosage Given: 1.8 Gy
Session Number: 14

## 2021-12-29 ENCOUNTER — Other Ambulatory Visit: Payer: Self-pay

## 2021-12-29 ENCOUNTER — Ambulatory Visit
Admission: RE | Admit: 2021-12-29 | Discharge: 2021-12-29 | Disposition: A | Discharge status: Home / Self Care | Payer: Medicare HMO | Source: Ambulatory Visit | Attending: Radiation Oncology | Admitting: Radiation Oncology

## 2021-12-29 ENCOUNTER — Encounter: Payer: Self-pay | Admitting: *Deleted

## 2021-12-29 DIAGNOSIS — Z510 Encounter for antineoplastic radiation therapy: Secondary | ICD-10-CM | POA: Diagnosis not present

## 2021-12-29 DIAGNOSIS — Z79899 Other long term (current) drug therapy: Secondary | ICD-10-CM | POA: Diagnosis not present

## 2021-12-29 DIAGNOSIS — R1310 Dysphagia, unspecified: Secondary | ICD-10-CM | POA: Diagnosis not present

## 2021-12-29 DIAGNOSIS — R52 Pain, unspecified: Secondary | ICD-10-CM | POA: Diagnosis not present

## 2021-12-29 DIAGNOSIS — C160 Malignant neoplasm of cardia: Secondary | ICD-10-CM | POA: Diagnosis not present

## 2021-12-29 DIAGNOSIS — C161 Malignant neoplasm of fundus of stomach: Secondary | ICD-10-CM | POA: Diagnosis not present

## 2021-12-29 LAB — RAD ONC ARIA SESSION SUMMARY
Course Elapsed Days: 20
Plan Fractions Treated to Date: 15
Plan Prescribed Dose Per Fraction: 1.8 Gy
Plan Total Fractions Prescribed: 25
Plan Total Prescribed Dose: 45 Gy
Reference Point Dosage Given to Date: 27 Gy
Reference Point Session Dosage Given: 1.8 Gy
Session Number: 15

## 2021-12-29 NOTE — Progress Notes (Signed)
Patient currently receiving radiation with Xeloda. He is having difficulty with side effects. He will hold the Xeloda, and return to the office on 01/01/22 for continued symptom management.  Oncology Nurse Navigator Documentation     12/29/2021    9:00 AM  Oncology Nurse Navigator Flowsheets  Navigator Follow Up Date: 01/01/2022  Navigator Follow Up Reason: Follow-up Appointment  Navigator Location [facility]  Navigator Encounter Type Appt/Treatment Plan Review  Patient Visit Type MedOnc  Treatment Phase Active Tx  Barriers/Navigation Needs No Barriers At This Time  Interventions None Required  Acuity Level 1-No Barriers  Support Groups/Services Friends and Family  Time Spent with Patient 15

## 2021-12-30 ENCOUNTER — Ambulatory Visit
Admission: RE | Admit: 2021-12-30 | Discharge: 2021-12-30 | Disposition: A | Discharge status: Home / Self Care | Payer: Medicare HMO | Source: Ambulatory Visit | Attending: Radiation Oncology | Admitting: Radiation Oncology

## 2021-12-30 ENCOUNTER — Other Ambulatory Visit: Payer: Self-pay

## 2021-12-30 DIAGNOSIS — R1310 Dysphagia, unspecified: Secondary | ICD-10-CM | POA: Diagnosis not present

## 2021-12-30 DIAGNOSIS — R52 Pain, unspecified: Secondary | ICD-10-CM | POA: Diagnosis not present

## 2021-12-30 DIAGNOSIS — Z510 Encounter for antineoplastic radiation therapy: Secondary | ICD-10-CM | POA: Diagnosis not present

## 2021-12-30 DIAGNOSIS — Z79899 Other long term (current) drug therapy: Secondary | ICD-10-CM | POA: Diagnosis not present

## 2021-12-30 DIAGNOSIS — C161 Malignant neoplasm of fundus of stomach: Secondary | ICD-10-CM | POA: Diagnosis not present

## 2021-12-30 DIAGNOSIS — C160 Malignant neoplasm of cardia: Secondary | ICD-10-CM | POA: Diagnosis not present

## 2021-12-30 LAB — RAD ONC ARIA SESSION SUMMARY
Course Elapsed Days: 21
Plan Fractions Treated to Date: 16
Plan Prescribed Dose Per Fraction: 1.8 Gy
Plan Total Fractions Prescribed: 25
Plan Total Prescribed Dose: 45 Gy
Reference Point Dosage Given to Date: 28.8 Gy
Reference Point Session Dosage Given: 1.8 Gy
Session Number: 16

## 2021-12-31 ENCOUNTER — Other Ambulatory Visit: Payer: Self-pay

## 2021-12-31 ENCOUNTER — Ambulatory Visit
Admission: RE | Admit: 2021-12-31 | Discharge: 2021-12-31 | Disposition: A | Discharge status: Home / Self Care | Payer: Medicare HMO | Source: Ambulatory Visit | Attending: Radiation Oncology | Admitting: Radiation Oncology

## 2021-12-31 DIAGNOSIS — Z79899 Other long term (current) drug therapy: Secondary | ICD-10-CM | POA: Diagnosis not present

## 2021-12-31 DIAGNOSIS — R52 Pain, unspecified: Secondary | ICD-10-CM | POA: Diagnosis not present

## 2021-12-31 DIAGNOSIS — C160 Malignant neoplasm of cardia: Secondary | ICD-10-CM | POA: Diagnosis not present

## 2021-12-31 DIAGNOSIS — Z510 Encounter for antineoplastic radiation therapy: Secondary | ICD-10-CM | POA: Diagnosis not present

## 2021-12-31 DIAGNOSIS — R1310 Dysphagia, unspecified: Secondary | ICD-10-CM | POA: Diagnosis not present

## 2021-12-31 DIAGNOSIS — C161 Malignant neoplasm of fundus of stomach: Secondary | ICD-10-CM | POA: Diagnosis not present

## 2021-12-31 LAB — RAD ONC ARIA SESSION SUMMARY
Course Elapsed Days: 22
Plan Fractions Treated to Date: 17
Plan Prescribed Dose Per Fraction: 1.8 Gy
Plan Total Fractions Prescribed: 25
Plan Total Prescribed Dose: 45 Gy
Reference Point Dosage Given to Date: 30.6 Gy
Reference Point Session Dosage Given: 1.8 Gy
Session Number: 17

## 2022-01-01 ENCOUNTER — Inpatient Hospital Stay: Payer: Medicare HMO | Admitting: Hematology & Oncology

## 2022-01-01 ENCOUNTER — Inpatient Hospital Stay: Payer: Medicare HMO

## 2022-01-01 ENCOUNTER — Encounter: Payer: Self-pay | Admitting: *Deleted

## 2022-01-01 ENCOUNTER — Other Ambulatory Visit: Payer: Self-pay

## 2022-01-01 ENCOUNTER — Ambulatory Visit
Admission: RE | Admit: 2022-01-01 | Discharge: 2022-01-01 | Disposition: A | Discharge status: Home / Self Care | Payer: Medicare HMO | Source: Ambulatory Visit | Attending: Radiation Oncology | Admitting: Radiation Oncology

## 2022-01-01 ENCOUNTER — Encounter: Payer: Self-pay | Admitting: Hematology & Oncology

## 2022-01-01 VITALS — BP 134/60 | HR 74 | Temp 97.9°F | Resp 18 | Ht 69.0 in | Wt 155.0 lb

## 2022-01-01 DIAGNOSIS — R1310 Dysphagia, unspecified: Secondary | ICD-10-CM | POA: Diagnosis not present

## 2022-01-01 DIAGNOSIS — Z79899 Other long term (current) drug therapy: Secondary | ICD-10-CM | POA: Diagnosis not present

## 2022-01-01 DIAGNOSIS — C161 Malignant neoplasm of fundus of stomach: Secondary | ICD-10-CM | POA: Diagnosis not present

## 2022-01-01 DIAGNOSIS — R52 Pain, unspecified: Secondary | ICD-10-CM | POA: Diagnosis not present

## 2022-01-01 DIAGNOSIS — C160 Malignant neoplasm of cardia: Secondary | ICD-10-CM

## 2022-01-01 DIAGNOSIS — Z510 Encounter for antineoplastic radiation therapy: Secondary | ICD-10-CM | POA: Diagnosis not present

## 2022-01-01 LAB — CMP (CANCER CENTER ONLY)
ALT: 12 U/L (ref 0–44)
AST: 12 U/L — ABNORMAL LOW (ref 15–41)
Albumin: 4.2 g/dL (ref 3.5–5.0)
Alkaline Phosphatase: 77 U/L (ref 38–126)
Anion gap: 7 (ref 5–15)
BUN: 15 mg/dL (ref 8–23)
CO2: 27 mmol/L (ref 22–32)
Calcium: 9.4 mg/dL (ref 8.9–10.3)
Chloride: 101 mmol/L (ref 98–111)
Creatinine: 1.15 mg/dL (ref 0.61–1.24)
GFR, Estimated: >60 mL/min (ref >60)
Glucose, Bld: 117 mg/dL — ABNORMAL HIGH (ref 70–99)
Potassium: 4.2 mmol/L (ref 3.5–5.1)
Sodium: 135 mmol/L (ref 135–145)
Total Bilirubin: 0.4 mg/dL (ref 0.3–1.2)
Total Protein: 6.5 g/dL (ref 6.5–8.1)

## 2022-01-01 LAB — CBC WITH DIFFERENTIAL (CANCER CENTER ONLY)
Abs Immature Granulocytes: 0.02 10*3/uL (ref 0.00–0.07)
Basophils Absolute: 0.1 10*3/uL (ref 0.0–0.1)
Basophils Relative: 1 %
Eosinophils Absolute: 0.2 10*3/uL (ref 0.0–0.5)
Eosinophils Relative: 3 %
HCT: 39.4 % (ref 39.0–52.0)
Hemoglobin: 12.6 g/dL — ABNORMAL LOW (ref 13.0–17.0)
Immature Granulocytes: 0 %
Lymphocytes Relative: 9 %
Lymphs Abs: 0.5 10*3/uL — ABNORMAL LOW (ref 0.7–4.0)
MCH: 30.1 pg (ref 26.0–34.0)
MCHC: 32 g/dL (ref 30.0–36.0)
MCV: 94.3 fL (ref 80.0–100.0)
Monocytes Absolute: 0.9 10*3/uL (ref 0.1–1.0)
Monocytes Relative: 17 %
Neutro Abs: 3.7 10*3/uL (ref 1.7–7.7)
Neutrophils Relative %: 70 %
Platelet Count: 253 10*3/uL (ref 150–400)
RBC: 4.18 MIL/uL — ABNORMAL LOW (ref 4.22–5.81)
RDW: 15 % (ref 11.5–15.5)
WBC Count: 5.3 10*3/uL (ref 4.0–10.5)
nRBC: 0 % (ref 0.0–0.2)

## 2022-01-01 LAB — RAD ONC ARIA SESSION SUMMARY
Course Elapsed Days: 23
Plan Fractions Treated to Date: 18
Plan Prescribed Dose Per Fraction: 1.8 Gy
Plan Total Fractions Prescribed: 25
Plan Total Prescribed Dose: 45 Gy
Reference Point Dosage Given to Date: 32.4 Gy
Reference Point Session Dosage Given: 1.8 Gy
Session Number: 18

## 2022-01-01 LAB — TSH: TSH: 3.061 u[IU]/mL (ref 0.350–4.500)

## 2022-01-01 NOTE — Progress Notes (Signed)
Patient condition improved this visit. Will follow up in about 3 weeks once radiation is complete.  Oncology Nurse Navigator Documentation     01/01/2022   12:30 PM  Oncology Nurse Navigator Flowsheets  Navigator Follow Up Date: 01/22/2022  Navigator Follow Up Reason: Follow-up Appointment  Navigator Location [facility]  Navigator Encounter Type Appt/Treatment Plan Review  Patient Visit Type MedOnc  Treatment Phase Active Tx  Barriers/Navigation Needs No Barriers At This Time  Interventions None Required  Acuity Level 1-No Barriers  Support Groups/Services Friends and Family  Time Spent with Patient 15

## 2022-01-01 NOTE — Progress Notes (Signed)
Hematology and Oncology Follow Up Visit  George Dyer [id number] [birth year] 81 y.o. 01/01/2022   Principle Diagnosis:  Stage IIB (T3N0M0) Gastric cancer -- declines surgery  Current Therapy:   FOLFOX - s/p cycle #4-  start on 09/15/2021 XRT/Xeloda -- start on 12/07/2021     Interim History:  Mr. Frisina is back for follow-up.  He looks a whole lot better.  I am so happy for him.  He has 1 more week of radiation to go.  Having the week off from Xeloda certainly has helped quite a bit.  He is swallowing a little bit better.  There is still some odynophagia.  He has had no problems blood pressure.  We stopped his Zestoretic.  His blood pressure still okay today.  He has had no bleeding.  He has had no diarrhea.  He has had no mouth sores.  He has had no swelling in his legs.  Overall, I would say his performance status is probably ECOG 1.     Medications:  Current Outpatient Medications:    HYDROcodone-acetaminophen (HYCET) 7.5-325 mg/15 ml solution, Take 10 mLs by mouth every 6 (six) hours as needed for moderate pain., Disp: 250 mL, Rfl: 0   lisinopril-hydrochlorothiazide (ZESTORETIC) 20-12.5 MG tablet, Take 1 tablet by mouth daily., Disp: 90 tablet, Rfl: 3   omeprazole (PRILOSEC) 40 MG capsule, Take 1 capsule (40 mg total) by mouth 2 (two) times daily before a meal., Disp: 60 capsule, Rfl: 12   polyethylene glycol (MIRALAX / GLYCOLAX) 17 g packet, Take 17 g by mouth daily., Disp: , Rfl:    prochlorperazine (COMPAZINE) 10 MG tablet, Take 1 tablet (10 mg total) by mouth every 6 (six) hours as needed (Nausea or vomiting)., Disp: 30 tablet, Rfl: 1   traMADol (ULTRAM) 50 MG tablet, Take 50 mg by mouth every 6 (six) hours as needed., Disp: , Rfl:    acetaminophen (TYLENOL) 650 MG CR tablet, Take 1,300 mg by mouth 2 (two) times daily. (Patient not taking: Reported on 12/25/2021), Disp: , Rfl:    alum & mag hydroxide-simeth (MAALOX/MYLANTA) 163-845-36 MG/5ML suspension, Take 30 mLs by  mouth daily. (Patient not taking: Reported on 12/25/2021), Disp: , Rfl:    capecitabine (XELODA) 500 MG tablet, Take 3 tablets (1,500 mg total) by mouth daily. Take within 30 minutes after a meal. Start 2 days BEFORE radiation starts!!! Take on days of radiation thereafter. (Patient not taking: Reported on 01/01/2022), Disp: 90 tablet, Rfl: 0   gabapentin (NEURONTIN) 300 MG capsule, Take 1 capsule (300 mg total) by mouth 3 (three) times daily. (Patient not taking: Reported on 12/02/2021), Disp: 90 capsule, Rfl: 3   Heparin Na, Pork, Lock Flsh PF 100 UNIT/ML SOLN, Dispense 24 syringes with 2.49m dose (discard remainder). Flush each PICC lumen Monday, Wednesday, and Friday., Disp: 120 mL, Rfl: 1   levothyroxine (SYNTHROID) 50 MCG tablet, Take 50 mcg by mouth daily. (Patient not taking: Reported on 12/02/2021), Disp: , Rfl:    LORazepam (ATIVAN) 0.5 MG tablet, Take 1 tablet (0.5 mg total) by mouth every 8 (eight) hours. (Patient not taking: Reported on 12/25/2021), Disp: 30 tablet, Rfl: 0   ondansetron (ZOFRAN) 8 MG tablet, Take 1 tablet (8 mg total) by mouth every 8 (eight) hours as needed for nausea or vomiting. (Patient not taking: Reported on 12/25/2021), Disp: 20 tablet, Rfl: 0   rOPINIRole (REQUIP) 2 MG tablet, Take 2 mg by mouth at bedtime. (Patient not taking: Reported on 12/02/2021), Disp: , Rfl:  sodium chloride flush (NS) 0.9 % SOLN, Inject 10 mLs into each port 3 (three) times a week. Flush each port of PICC Monday, Wednesday and Friday., Disp: 240 mL, Rfl: 1   sucralfate (CARAFATE) 1 g tablet, Take 1 tablet (1 g total) by mouth 4 (four) times daily -  with meals and at bedtime. Crush and dissolve and 10 mL of warm water prior to swallowing, take 20 minutes prior to meals (Patient not taking: Reported on 12/25/2021), Disp: 120 tablet, Rfl: 1 No current facility-administered medications for this visit.  Facility-Administered Medications Ordered in Other Visits:    heparin lock flush 100 unit/mL, 500  Units, Intravenous, Once, Tavita Eastham, Rudell Cobb, MD   HYDROcodone-acetaminophen (NORCO/VICODIN) 5-325 MG per tablet 1 tablet, 1 tablet, Oral, Once, Celso Amy, NP   sodium chloride flush (NS) 0.9 % injection 10 mL, 10 mL, Intravenous, Once, Gael Londo, Rudell Cobb, MD  Allergies:  Allergies  Allergen Reactions   Percocet [Oxycodone-Acetaminophen] Nausea And Vomiting   Oxycodone-Aspirin Nausea And Vomiting    Past Medical History, Surgical history, Social history, and Family History were reviewed and updated.  Review of Systems: Review of Systems  Constitutional:  Positive for fatigue.  HENT:  Negative.    Eyes: Negative.   Respiratory: Negative.    Cardiovascular: Negative.   Gastrointestinal:  Positive for nausea.  Endocrine: Negative.   Genitourinary: Negative.    Musculoskeletal: Negative.   Skin: Negative.   Neurological: Negative.   Hematological: Negative.   Psychiatric/Behavioral: Negative.      Physical Exam:  height is '5\' 9"'$  (1.753 m) and weight is 155 lb (70.3 kg). His oral temperature is 97.9 F (36.6 C). His blood pressure is 134/60 and his pulse is 74. His respiration is 18 and oxygen saturation is 99%.   Wt Readings from Last 3 Encounters:  01/01/22 155 lb (70.3 kg)  12/25/21 153 lb 12.8 oz (69.8 kg)  12/02/21 156 lb (70.8 kg)    Physical Exam Vitals reviewed.  HENT:     Head: Normocephalic and atraumatic.  Eyes:     Pupils: Pupils are equal, round, and reactive to light.  Cardiovascular:     Rate and Rhythm: Normal rate and regular rhythm.     Heart sounds: Normal heart sounds.  Pulmonary:     Effort: Pulmonary effort is normal.     Breath sounds: Normal breath sounds.  Abdominal:     General: Bowel sounds are normal.     Palpations: Abdomen is soft.  Musculoskeletal:        General: No tenderness or deformity. Normal range of motion.     Cervical back: Normal range of motion.  Lymphadenopathy:     Cervical: No cervical adenopathy.  Skin:     General: Skin is warm and dry.     Findings: No erythema or rash.  Neurological:     Mental Status: He is alert and oriented to person, place, and time.  Psychiatric:        Behavior: Behavior normal.        Thought Content: Thought content normal.        Judgment: Judgment normal.    Lab Results  Component Value Date   WBC 5.3 01/01/2022   HGB 12.6 (L) 01/01/2022   HCT 39.4 01/01/2022   MCV 94.3 01/01/2022   PLT 253 01/01/2022     Chemistry      Component Value Date/Time   NA 135 01/01/2022 1108   NA 139 07/23/2020 0818  K 4.2 01/01/2022 1108   CL 101 01/01/2022 1108   CO2 27 01/01/2022 1108   BUN 15 01/01/2022 1108   BUN 10 07/23/2020 0818   CREATININE 1.15 01/01/2022 1108   CREATININE 1.06 06/27/2015 0837      Component Value Date/Time   CALCIUM 9.4 01/01/2022 1108   ALKPHOS 77 01/01/2022 1108   AST 12 (L) 01/01/2022 1108   ALT 12 01/01/2022 1108   BILITOT 0.4 01/01/2022 1108      Impression and Plan: Mr. Denzer is a very nice 81 year old white male.  He has what I hope to believe localized gastric cancer.   He has had 4 cycles of chemotherapy.  Again, it does not look that the chemotherapy has been all that effective.  He now is on radiation therapy with Xeloda.  He has 1 more week left.  I told him to restart the Xeloda at 1000 mg a day.  I think this would be reasonable that he would be able to tolerate.  I would like to see him back in about 3 weeks.  By then, he would have finished his radiation.  I would not expect that we would do any scans on him probably for good 2 months.    Volanda Napoleon, MD 7/28/202311:51 AM

## 2022-01-04 ENCOUNTER — Other Ambulatory Visit: Payer: Self-pay

## 2022-01-04 ENCOUNTER — Ambulatory Visit
Admission: RE | Admit: 2022-01-04 | Discharge: 2022-01-04 | Disposition: A | Discharge status: Home / Self Care | Payer: Medicare HMO | Source: Ambulatory Visit | Attending: Radiation Oncology | Admitting: Radiation Oncology

## 2022-01-04 DIAGNOSIS — R1310 Dysphagia, unspecified: Secondary | ICD-10-CM | POA: Diagnosis not present

## 2022-01-04 DIAGNOSIS — Z79899 Other long term (current) drug therapy: Secondary | ICD-10-CM | POA: Diagnosis not present

## 2022-01-04 DIAGNOSIS — C160 Malignant neoplasm of cardia: Secondary | ICD-10-CM | POA: Diagnosis not present

## 2022-01-04 DIAGNOSIS — Z510 Encounter for antineoplastic radiation therapy: Secondary | ICD-10-CM | POA: Diagnosis not present

## 2022-01-04 DIAGNOSIS — C161 Malignant neoplasm of fundus of stomach: Secondary | ICD-10-CM | POA: Diagnosis not present

## 2022-01-04 DIAGNOSIS — R52 Pain, unspecified: Secondary | ICD-10-CM | POA: Diagnosis not present

## 2022-01-04 LAB — RAD ONC ARIA SESSION SUMMARY
Course Elapsed Days: 26
Plan Fractions Treated to Date: 19
Plan Prescribed Dose Per Fraction: 1.8 Gy
Plan Total Fractions Prescribed: 25
Plan Total Prescribed Dose: 45 Gy
Reference Point Dosage Given to Date: 34.2 Gy
Reference Point Session Dosage Given: 1.8 Gy
Session Number: 19

## 2022-01-05 ENCOUNTER — Ambulatory Visit
Admission: RE | Admit: 2022-01-05 | Discharge: 2022-01-05 | Disposition: A | Discharge status: Home / Self Care | Payer: Medicare HMO | Source: Ambulatory Visit | Attending: Radiation Oncology | Admitting: Radiation Oncology

## 2022-01-05 ENCOUNTER — Other Ambulatory Visit: Payer: Self-pay

## 2022-01-05 ENCOUNTER — Ambulatory Visit: Payer: Medicare HMO

## 2022-01-05 DIAGNOSIS — R1310 Dysphagia, unspecified: Secondary | ICD-10-CM | POA: Insufficient documentation

## 2022-01-05 DIAGNOSIS — Z79899 Other long term (current) drug therapy: Secondary | ICD-10-CM | POA: Insufficient documentation

## 2022-01-05 DIAGNOSIS — C161 Malignant neoplasm of fundus of stomach: Secondary | ICD-10-CM | POA: Diagnosis not present

## 2022-01-05 DIAGNOSIS — Z510 Encounter for antineoplastic radiation therapy: Secondary | ICD-10-CM | POA: Diagnosis not present

## 2022-01-05 DIAGNOSIS — R52 Pain, unspecified: Secondary | ICD-10-CM | POA: Diagnosis not present

## 2022-01-05 DIAGNOSIS — C160 Malignant neoplasm of cardia: Secondary | ICD-10-CM | POA: Diagnosis not present

## 2022-01-05 LAB — RAD ONC ARIA SESSION SUMMARY
Course Elapsed Days: 27
Plan Fractions Treated to Date: 20
Plan Prescribed Dose Per Fraction: 1.8 Gy
Plan Total Fractions Prescribed: 25
Plan Total Prescribed Dose: 45 Gy
Reference Point Dosage Given to Date: 36 Gy
Reference Point Session Dosage Given: 1.8 Gy
Session Number: 20

## 2022-01-06 ENCOUNTER — Other Ambulatory Visit: Payer: Self-pay

## 2022-01-06 ENCOUNTER — Ambulatory Visit
Admission: RE | Admit: 2022-01-06 | Discharge: 2022-01-06 | Disposition: A | Discharge status: Home / Self Care | Payer: Medicare HMO | Source: Ambulatory Visit | Attending: Radiation Oncology | Admitting: Radiation Oncology

## 2022-01-06 DIAGNOSIS — R52 Pain, unspecified: Secondary | ICD-10-CM | POA: Diagnosis not present

## 2022-01-06 DIAGNOSIS — R1310 Dysphagia, unspecified: Secondary | ICD-10-CM | POA: Diagnosis not present

## 2022-01-06 DIAGNOSIS — C160 Malignant neoplasm of cardia: Secondary | ICD-10-CM | POA: Diagnosis not present

## 2022-01-06 DIAGNOSIS — Z510 Encounter for antineoplastic radiation therapy: Secondary | ICD-10-CM | POA: Diagnosis not present

## 2022-01-06 DIAGNOSIS — Z79899 Other long term (current) drug therapy: Secondary | ICD-10-CM | POA: Diagnosis not present

## 2022-01-06 DIAGNOSIS — C161 Malignant neoplasm of fundus of stomach: Secondary | ICD-10-CM | POA: Diagnosis not present

## 2022-01-06 LAB — RAD ONC ARIA SESSION SUMMARY
Course Elapsed Days: 28
Plan Fractions Treated to Date: 21
Plan Prescribed Dose Per Fraction: 1.8 Gy
Plan Total Fractions Prescribed: 25
Plan Total Prescribed Dose: 45 Gy
Reference Point Dosage Given to Date: 37.8 Gy
Reference Point Session Dosage Given: 1.8 Gy
Session Number: 21

## 2022-01-07 ENCOUNTER — Other Ambulatory Visit: Payer: Self-pay

## 2022-01-07 ENCOUNTER — Ambulatory Visit
Admission: RE | Admit: 2022-01-07 | Discharge: 2022-01-07 | Disposition: A | Discharge status: Home / Self Care | Payer: Medicare HMO | Source: Ambulatory Visit | Attending: Radiation Oncology | Admitting: Radiation Oncology

## 2022-01-07 DIAGNOSIS — C161 Malignant neoplasm of fundus of stomach: Secondary | ICD-10-CM | POA: Diagnosis not present

## 2022-01-07 DIAGNOSIS — R1310 Dysphagia, unspecified: Secondary | ICD-10-CM | POA: Diagnosis not present

## 2022-01-07 DIAGNOSIS — R52 Pain, unspecified: Secondary | ICD-10-CM | POA: Diagnosis not present

## 2022-01-07 DIAGNOSIS — C160 Malignant neoplasm of cardia: Secondary | ICD-10-CM | POA: Diagnosis not present

## 2022-01-07 DIAGNOSIS — Z79899 Other long term (current) drug therapy: Secondary | ICD-10-CM | POA: Diagnosis not present

## 2022-01-07 DIAGNOSIS — Z510 Encounter for antineoplastic radiation therapy: Secondary | ICD-10-CM | POA: Diagnosis not present

## 2022-01-07 LAB — RAD ONC ARIA SESSION SUMMARY
Course Elapsed Days: 29
Plan Fractions Treated to Date: 22
Plan Prescribed Dose Per Fraction: 1.8 Gy
Plan Total Fractions Prescribed: 25
Plan Total Prescribed Dose: 45 Gy
Reference Point Dosage Given to Date: 39.6 Gy
Reference Point Session Dosage Given: 1.8 Gy
Session Number: 22

## 2022-01-08 ENCOUNTER — Other Ambulatory Visit: Payer: Self-pay

## 2022-01-08 ENCOUNTER — Ambulatory Visit
Admission: RE | Admit: 2022-01-08 | Discharge: 2022-01-08 | Disposition: A | Discharge status: Home / Self Care | Payer: Medicare HMO | Source: Ambulatory Visit | Attending: Radiation Oncology | Admitting: Radiation Oncology

## 2022-01-08 DIAGNOSIS — R1310 Dysphagia, unspecified: Secondary | ICD-10-CM | POA: Diagnosis not present

## 2022-01-08 DIAGNOSIS — Z79899 Other long term (current) drug therapy: Secondary | ICD-10-CM | POA: Diagnosis not present

## 2022-01-08 DIAGNOSIS — C160 Malignant neoplasm of cardia: Secondary | ICD-10-CM | POA: Diagnosis not present

## 2022-01-08 DIAGNOSIS — C161 Malignant neoplasm of fundus of stomach: Secondary | ICD-10-CM | POA: Diagnosis not present

## 2022-01-08 DIAGNOSIS — R52 Pain, unspecified: Secondary | ICD-10-CM | POA: Diagnosis not present

## 2022-01-08 DIAGNOSIS — Z510 Encounter for antineoplastic radiation therapy: Secondary | ICD-10-CM | POA: Diagnosis not present

## 2022-01-08 LAB — RAD ONC ARIA SESSION SUMMARY
Course Elapsed Days: 30
Plan Fractions Treated to Date: 23
Plan Prescribed Dose Per Fraction: 1.8 Gy
Plan Total Fractions Prescribed: 25
Plan Total Prescribed Dose: 45 Gy
Reference Point Dosage Given to Date: 41.4 Gy
Reference Point Session Dosage Given: 1.8 Gy
Session Number: 23

## 2022-01-11 ENCOUNTER — Other Ambulatory Visit: Payer: Self-pay

## 2022-01-11 ENCOUNTER — Ambulatory Visit
Admission: RE | Admit: 2022-01-11 | Discharge: 2022-01-11 | Disposition: A | Discharge status: Home / Self Care | Payer: Medicare HMO | Source: Ambulatory Visit | Attending: Radiation Oncology | Admitting: Radiation Oncology

## 2022-01-11 DIAGNOSIS — C160 Malignant neoplasm of cardia: Secondary | ICD-10-CM | POA: Diagnosis not present

## 2022-01-11 DIAGNOSIS — Z510 Encounter for antineoplastic radiation therapy: Secondary | ICD-10-CM | POA: Diagnosis not present

## 2022-01-11 DIAGNOSIS — R1310 Dysphagia, unspecified: Secondary | ICD-10-CM | POA: Diagnosis not present

## 2022-01-11 DIAGNOSIS — Z79899 Other long term (current) drug therapy: Secondary | ICD-10-CM | POA: Diagnosis not present

## 2022-01-11 DIAGNOSIS — C161 Malignant neoplasm of fundus of stomach: Secondary | ICD-10-CM | POA: Diagnosis not present

## 2022-01-11 DIAGNOSIS — R52 Pain, unspecified: Secondary | ICD-10-CM | POA: Diagnosis not present

## 2022-01-11 LAB — RAD ONC ARIA SESSION SUMMARY
Course Elapsed Days: 33
Plan Fractions Treated to Date: 24
Plan Prescribed Dose Per Fraction: 1.8 Gy
Plan Total Fractions Prescribed: 25
Plan Total Prescribed Dose: 45 Gy
Reference Point Dosage Given to Date: 43.2 Gy
Reference Point Session Dosage Given: 1.8 Gy
Session Number: 24

## 2022-01-12 ENCOUNTER — Ambulatory Visit
Admission: RE | Admit: 2022-01-12 | Discharge: 2022-01-12 | Disposition: A | Discharge status: Home / Self Care | Payer: Medicare HMO | Source: Ambulatory Visit | Attending: Radiation Oncology | Admitting: Radiation Oncology

## 2022-01-12 ENCOUNTER — Other Ambulatory Visit: Payer: Self-pay

## 2022-01-12 ENCOUNTER — Encounter: Payer: Self-pay | Admitting: Radiation Oncology

## 2022-01-12 DIAGNOSIS — C160 Malignant neoplasm of cardia: Secondary | ICD-10-CM | POA: Diagnosis not present

## 2022-01-12 DIAGNOSIS — R1310 Dysphagia, unspecified: Secondary | ICD-10-CM | POA: Diagnosis not present

## 2022-01-12 DIAGNOSIS — Z79899 Other long term (current) drug therapy: Secondary | ICD-10-CM | POA: Diagnosis not present

## 2022-01-12 DIAGNOSIS — Z510 Encounter for antineoplastic radiation therapy: Secondary | ICD-10-CM | POA: Diagnosis not present

## 2022-01-12 DIAGNOSIS — R52 Pain, unspecified: Secondary | ICD-10-CM | POA: Diagnosis not present

## 2022-01-12 DIAGNOSIS — C161 Malignant neoplasm of fundus of stomach: Secondary | ICD-10-CM | POA: Diagnosis not present

## 2022-01-12 LAB — RAD ONC ARIA SESSION SUMMARY
Course Elapsed Days: 34
Plan Fractions Treated to Date: 25
Plan Prescribed Dose Per Fraction: 1.8 Gy
Plan Total Fractions Prescribed: 25
Plan Total Prescribed Dose: 45 Gy
Reference Point Dosage Given to Date: 45 Gy
Reference Point Session Dosage Given: 1.8 Gy
Session Number: 25

## 2022-01-13 ENCOUNTER — Ambulatory Visit: Payer: Medicare HMO

## 2022-01-14 ENCOUNTER — Ambulatory Visit: Payer: Medicare HMO

## 2022-01-15 ENCOUNTER — Ambulatory Visit: Payer: Medicare HMO

## 2022-01-15 ENCOUNTER — Other Ambulatory Visit (HOSPITAL_COMMUNITY): Payer: Self-pay

## 2022-01-18 ENCOUNTER — Telehealth: Payer: Self-pay

## 2022-01-18 ENCOUNTER — Ambulatory Visit: Payer: Medicare HMO

## 2022-01-18 NOTE — Telephone Encounter (Signed)
Called patient to check  on status. Patient has not been feeling well and wanted to hold his radiation treatments last week. Per wife patient does not wish to complete treatments at this time.

## 2022-01-19 ENCOUNTER — Ambulatory Visit: Payer: Medicare HMO

## 2022-01-19 ENCOUNTER — Telehealth: Payer: Self-pay | Admitting: *Deleted

## 2022-01-19 NOTE — Telephone Encounter (Signed)
CALLED PATIENT TO INFORM OF FU APPT. ON 02-15-22 @ 10 AM, SPOKE WITH PATIENT'S DAUGHTER MICHELLE AND SHE IS AWARE OF THIS APPT.

## 2022-01-20 ENCOUNTER — Ambulatory Visit: Payer: Medicare HMO

## 2022-01-20 ENCOUNTER — Other Ambulatory Visit (HOSPITAL_COMMUNITY): Payer: Self-pay

## 2022-01-21 ENCOUNTER — Ambulatory Visit: Payer: Medicare HMO

## 2022-01-22 ENCOUNTER — Other Ambulatory Visit: Payer: Self-pay

## 2022-01-22 ENCOUNTER — Inpatient Hospital Stay (HOSPITAL_BASED_OUTPATIENT_CLINIC_OR_DEPARTMENT_OTHER): Payer: Medicare HMO | Admitting: Hematology & Oncology

## 2022-01-22 ENCOUNTER — Encounter: Payer: Self-pay | Admitting: *Deleted

## 2022-01-22 ENCOUNTER — Inpatient Hospital Stay: Payer: Medicare HMO

## 2022-01-22 ENCOUNTER — Inpatient Hospital Stay: Payer: Medicare HMO | Attending: Hematology & Oncology

## 2022-01-22 ENCOUNTER — Encounter: Payer: Self-pay | Admitting: Hematology & Oncology

## 2022-01-22 VITALS — BP 141/76 | HR 73 | Temp 97.9°F | Resp 19 | Wt 159.0 lb

## 2022-01-22 DIAGNOSIS — C162 Malignant neoplasm of body of stomach: Secondary | ICD-10-CM | POA: Diagnosis not present

## 2022-01-22 DIAGNOSIS — Z79899 Other long term (current) drug therapy: Secondary | ICD-10-CM | POA: Diagnosis not present

## 2022-01-22 DIAGNOSIS — C160 Malignant neoplasm of cardia: Secondary | ICD-10-CM | POA: Diagnosis not present

## 2022-01-22 LAB — CMP (CANCER CENTER ONLY)
ALT: 12 U/L (ref 0–44)
AST: 13 U/L — ABNORMAL LOW (ref 15–41)
Albumin: 3.9 g/dL (ref 3.5–5.0)
Alkaline Phosphatase: 86 U/L (ref 38–126)
Anion gap: 6 (ref 5–15)
BUN: 14 mg/dL (ref 8–23)
CO2: 27 mmol/L (ref 22–32)
Calcium: 9.4 mg/dL (ref 8.9–10.3)
Chloride: 103 mmol/L (ref 98–111)
Creatinine: 1.12 mg/dL (ref 0.61–1.24)
GFR, Estimated: >60 mL/min (ref >60)
Glucose, Bld: 110 mg/dL — ABNORMAL HIGH (ref 70–99)
Potassium: 4 mmol/L (ref 3.5–5.1)
Sodium: 136 mmol/L (ref 135–145)
Total Bilirubin: 0.3 mg/dL (ref 0.3–1.2)
Total Protein: 6.6 g/dL (ref 6.5–8.1)

## 2022-01-22 LAB — CBC WITH DIFFERENTIAL (CANCER CENTER ONLY)
Abs Immature Granulocytes: 0.02 10*3/uL (ref 0.00–0.07)
Basophils Absolute: 0.1 10*3/uL (ref 0.0–0.1)
Basophils Relative: 1 %
Eosinophils Absolute: 0.3 10*3/uL (ref 0.0–0.5)
Eosinophils Relative: 5 %
HCT: 38.5 % — ABNORMAL LOW (ref 39.0–52.0)
Hemoglobin: 12.6 g/dL — ABNORMAL LOW (ref 13.0–17.0)
Immature Granulocytes: 0 %
Lymphocytes Relative: 9 %
Lymphs Abs: 0.4 10*3/uL — ABNORMAL LOW (ref 0.7–4.0)
MCH: 30.6 pg (ref 26.0–34.0)
MCHC: 32.7 g/dL (ref 30.0–36.0)
MCV: 93.4 fL (ref 80.0–100.0)
Monocytes Absolute: 0.9 10*3/uL (ref 0.1–1.0)
Monocytes Relative: 18 %
Neutro Abs: 3.3 10*3/uL (ref 1.7–7.7)
Neutrophils Relative %: 67 %
Platelet Count: 251 10*3/uL (ref 150–400)
RBC: 4.12 MIL/uL — ABNORMAL LOW (ref 4.22–5.81)
RDW: 13.9 % (ref 11.5–15.5)
WBC Count: 5 10*3/uL (ref 4.0–10.5)
nRBC: 0 % (ref 0.0–0.2)

## 2022-01-22 LAB — LACTATE DEHYDROGENASE: LDH: 98 U/L (ref 98–192)

## 2022-01-22 NOTE — Progress Notes (Signed)
Patient will need a PET scan prior to his next appointment, but must be after 03/11/2022. Will schedule closer to that time and after PA obtained.   He is also going to need a post treatment endoscopy near the end of September. Message sent to Dr Steve Rattler nurse.   Oncology Nurse Navigator Documentation     01/22/2022   10:00 AM  Oncology Nurse Navigator Flowsheets  Navigator Follow Up Date: 02/19/2022  Navigator Follow Up Reason: Radiology  Navigator Location [facility]  Navigator Encounter Type Appt/Treatment Plan Review  Patient Visit Type MedOnc  Treatment Phase Active Tx  Barriers/Navigation Needs No Barriers At This Time  Interventions Coordination of Care  Acuity Level 1-No Barriers  Coordination of Care Other  Support Groups/Services Friends and Family  Time Spent with Patient 15

## 2022-01-22 NOTE — Progress Notes (Signed)
Hematology and Oncology Follow Up Visit  George Dyer [id number] May 14, 1941 81 y.o. 01/22/2022   Principle Diagnosis:  Stage IIB (T3N0M0) Gastric cancer -- declines surgery  Current Therapy:   FOLFOX - s/p cycle #4-  start on 09/15/2021 XRT/Xeloda -- start on 12/07/2021 --completed on 01/12/2022     Interim History:  George Dyer is back for follow-up.  He had a very tough time with the last week of radiation and Xeloda.  He can only complete 2 of the last 5 days.  He was so tired.  He could barely get out of bed.  He had a lot of odynophagia.  He still has some odynophagia.  I did give him some liquid Norco to try to help with his pain.  He says things are getting a little bit better.  He is able to swallow a little bit more right now.  He is still taking mostly liquids.  I told him he wanted to have solids, but seem to have would be fish because this is easy to digest.  He has had no diarrhea.  He has had no bleeding.  There has been no cough or shortness of breath.  He has had no headache.  There is been no obvious mucositis.  Overall, I would say his performance status is probably ECOG 2.       Medications:  Current Outpatient Medications:    acetaminophen (TYLENOL) 650 MG CR tablet, Take 1,300 mg by mouth 2 (two) times daily. (Patient not taking: Reported on 12/25/2021), Disp: , Rfl:    alum & mag hydroxide-simeth (MAALOX/MYLANTA) 528-413-24 MG/5ML suspension, Take 30 mLs by mouth daily. (Patient not taking: Reported on 12/25/2021), Disp: , Rfl:    capecitabine (XELODA) 500 MG tablet, Take 3 tablets (1,500 mg total) by mouth daily. Take within 30 minutes after a meal. Start 2 days BEFORE radiation starts!!! Take on days of radiation thereafter. (Patient not taking: Reported on 01/01/2022), Disp: 90 tablet, Rfl: 0   gabapentin (NEURONTIN) 300 MG capsule, Take 1 capsule (300 mg total) by mouth 3 (three) times daily. (Patient not taking: Reported on 12/02/2021), Disp: 90 capsule,  Rfl: 3   HYDROcodone-acetaminophen (HYCET) 7.5-325 mg/15 ml solution, Take 10 mLs by mouth every 6 (six) hours as needed for moderate pain., Disp: 250 mL, Rfl: 0   levothyroxine (SYNTHROID) 50 MCG tablet, Take 50 mcg by mouth daily. (Patient not taking: Reported on 12/02/2021), Disp: , Rfl:    lisinopril-hydrochlorothiazide (ZESTORETIC) 20-12.5 MG tablet, Take 1 tablet by mouth daily., Disp: 90 tablet, Rfl: 3   LORazepam (ATIVAN) 0.5 MG tablet, Take 1 tablet (0.5 mg total) by mouth every 8 (eight) hours. (Patient not taking: Reported on 12/25/2021), Disp: 30 tablet, Rfl: 0   omeprazole (PRILOSEC) 40 MG capsule, Take 1 capsule (40 mg total) by mouth 2 (two) times daily before a meal., Disp: 60 capsule, Rfl: 12   ondansetron (ZOFRAN) 8 MG tablet, Take 1 tablet (8 mg total) by mouth every 8 (eight) hours as needed for nausea or vomiting. (Patient not taking: Reported on 12/25/2021), Disp: 20 tablet, Rfl: 0   polyethylene glycol (MIRALAX / GLYCOLAX) 17 g packet, Take 17 g by mouth daily., Disp: , Rfl:    prochlorperazine (COMPAZINE) 10 MG tablet, Take 1 tablet (10 mg total) by mouth every 6 (six) hours as needed (Nausea or vomiting)., Disp: 30 tablet, Rfl: 1   rOPINIRole (REQUIP) 2 MG tablet, Take 2 mg by mouth at bedtime. (Patient not taking: Reported on 12/02/2021),  Disp: , Rfl:    sucralfate (CARAFATE) 1 g tablet, Take 1 tablet (1 g total) by mouth 4 (four) times daily -  with meals and at bedtime. Crush and dissolve and 10 mL of warm water prior to swallowing, take 20 minutes prior to meals (Patient not taking: Reported on 12/25/2021), Disp: 120 tablet, Rfl: 1   traMADol (ULTRAM) 50 MG tablet, Take 50 mg by mouth every 6 (six) hours as needed., Disp: , Rfl:  No current facility-administered medications for this visit.  Facility-Administered Medications Ordered in Other Visits:    heparin lock flush 100 unit/mL, 500 Units, Intravenous, Once, George Dyer, George Cobb, MD   HYDROcodone-acetaminophen (NORCO/VICODIN)  5-325 MG per tablet 1 tablet, 1 tablet, Oral, Once, George Amy, NP   sodium chloride flush (NS) 0.9 % injection 10 mL, 10 mL, Intravenous, Once, George Dyer, George Cobb, MD  Allergies:  Allergies  Allergen Reactions   Percocet [Oxycodone-Acetaminophen] Nausea And Vomiting   Oxycodone-Aspirin Nausea And Vomiting    Past Medical History, Surgical history, Social history, and Family History were reviewed and updated.  Review of Systems: Review of Systems  Constitutional:  Positive for fatigue.  HENT:  Negative.    Eyes: Negative.   Respiratory: Negative.    Cardiovascular: Negative.   Gastrointestinal:  Positive for nausea.  Endocrine: Negative.   Genitourinary: Negative.    Musculoskeletal: Negative.   Skin: Negative.   Neurological: Negative.   Hematological: Negative.   Psychiatric/Behavioral: Negative.      Physical Exam:  weight is 159 lb (72.1 kg). His oral temperature is 97.9 F (36.6 C). His blood pressure is 141/76 (abnormal) and his pulse is 73. His respiration is 19 and oxygen saturation is 100%.   Wt Readings from Last 3 Encounters:  01/22/22 159 lb (72.1 kg)  01/01/22 155 lb (70.3 kg)  12/25/21 153 lb 12.8 oz (69.8 kg)    Physical Exam Vitals reviewed.  HENT:     Head: Normocephalic and atraumatic.  Eyes:     Pupils: Pupils are equal, round, and reactive to light.  Cardiovascular:     Rate and Rhythm: Normal rate and regular rhythm.     Heart sounds: Normal heart sounds.  Pulmonary:     Effort: Pulmonary effort is normal.     Breath sounds: Normal breath sounds.  Abdominal:     General: Bowel sounds are normal.     Palpations: Abdomen is soft.  Musculoskeletal:        General: No tenderness or deformity. Normal range of motion.     Cervical back: Normal range of motion.  Lymphadenopathy:     Cervical: No cervical adenopathy.  Skin:    General: Skin is warm and dry.     Findings: No erythema or rash.  Neurological:     Mental Status: He is alert  and oriented to person, place, and time.  Psychiatric:        Behavior: Behavior normal.        Thought Content: Thought content normal.        Judgment: Judgment normal.   Lab Results  Component Value Date   WBC 5.0 01/22/2022   HGB 12.6 (L) 01/22/2022   HCT 38.5 (L) 01/22/2022   MCV 93.4 01/22/2022   PLT 251 01/22/2022     Chemistry      Component Value Date/Time   NA 136 01/22/2022 0924   NA 139 07/23/2020 0818   K 4.0 01/22/2022 0924   CL 103 01/22/2022 9767  CO2 27 01/22/2022 0924   BUN 14 01/22/2022 0924   BUN 10 07/23/2020 0818   CREATININE 1.12 01/22/2022 0924   CREATININE 1.06 06/27/2015 0837      Component Value Date/Time   CALCIUM 9.4 01/22/2022 0924   ALKPHOS 86 01/22/2022 0924   AST 13 (L) 01/22/2022 0924   ALT 12 01/22/2022 0924   BILITOT 0.3 01/22/2022 0924      Impression and Plan: Mr. Senna is a very nice 81 year old white male.  He has what I hope to believe localized gastric cancer.   He has had 4 cycles of chemotherapy.  By the follow-up scans and endoscopy, he really did not have much of a response.  We subsequently gave him radiation with chemotherapy.  He had low-dose Xeloda.  Hopefully, we will find that he is in remission.  It is possible that we may need to think about some kind of maintenance therapy on him.  We will have to set him up with an upper endoscopy probably in late September.  He will need another PET scan probably in early October.  I think he will recover.  I think we will start to eat much better in a week or 2.  I gave him some encouragement.  I do not think he needs to have any IV fluids today.  His labs do not look all that bad.  I would like to see him back in about 7 weeks or so.  At that point, we would have had the upper endoscopy and the PET scan and we can see what is left of his malignancy.    Volanda Napoleon, MD 8/18/202310:18 AM

## 2022-01-29 ENCOUNTER — Telehealth: Payer: Self-pay

## 2022-01-29 NOTE — Telephone Encounter (Signed)
-----   Message from Cordelia Poche, RN sent at 01/22/2022 11:39 AM EDT ----- Regarding: Post Treatment Endoscopy Good morning,  Just want to make you aware that this patient has finished his treatment and will need his post treatment endoscopy, with Dr [name], near the end of September. I'm sure Dr Marin Olp will notify Dr [name] but I like to also make sure his nurse is aware. Thank you.  [facility] Nurse Navigator

## 2022-01-29 NOTE — Telephone Encounter (Signed)
Please see note below and advise  

## 2022-02-05 NOTE — Telephone Encounter (Signed)
Pt wife Janan Halter notified of Dr. [name] recommendations:  Pt was scheduled for an EGD on 03/16/2022 at 2:30 with Dr. [name] in the [place]: Pt wife Janan Halter made aware: Pt was scheduled for a previsit appointment on 02/25/2022 at 10:00 AM:  Pt wife Janan Halter made aware:  Janan Halter  verbalized understanding with all questions answered.

## 2022-02-05 NOTE — Telephone Encounter (Signed)
Please set him up for EGD towards the end of September @ [facility]

## 2022-02-10 ENCOUNTER — Encounter: Payer: Self-pay | Admitting: Radiation Oncology

## 2022-02-12 NOTE — Progress Notes (Incomplete)
   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 On the date of his final treatment, the patient reported fatigue, constant nausea (managed with zofran and ginger chews), and trouble eating due to burning pain when swallowing associated with Xeloda. Physical exam performed on that same date revealed abdominal distention due to constipation. Otherwise, the abdomen was soft and nontender with normal bowel sounds. For his dysphagia, the patient was encouraged to resume carafate use.   Plan: The patient has completed radiation treatment. The patient will return to radiation oncology clinic for routine followup in one month. I advised them to call or return sooner if they have any questions or concerns related to their recovery or treatment.  -----------------------------------  Blair Promise, PhD, MD  This document serves as a record of services personally performed by Gery Pray, MD. It was created on his behalf by Roney Mans, a trained medical scribe. The creation of this record is based on the scribe's personal observations and the provider's statements to them. This document has been checked and approved by the attending  provider.

## 2022-02-14 NOTE — Progress Notes (Signed)
Radiation Oncology         [phone] ________________________________  Name: George Dyer MRN: [id number]  Date: 02/15/2022  DOB: 20-Apr-1941  Follow-Up Visit Note  CC: Gweneth Fritter, FNP  Volanda Napoleon, MD  No diagnosis found.  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 The patient tolerated radiation treatment relatively well. On the date of his final treatment, the patient reported fatigue, constant nausea (managed with zofran and ginger chews), and trouble eating due to burning pain when swallowing associated with Xeloda. Physical exam performed on that same date revealed abdominal distention due to constipation. Otherwise, the abdomen was soft and nontender with normal bowel sounds. For his dysphagia, the patient was encouraged to resume carafate use.     Since completing radiation, the patient followed up with Dr. Marin Olp on 01/22/22. During which time, the patient was noted to have had a tough time with the last week of radiation and Xeloda. Given so, he was only able to complete 2 of the last 5 days of xeloda.  His symptoms included profound fatigue and a lot of odynophagia. For his pain, Dr. Marin Olp tried him on liquid Norco with some improvement. Moving forward, Dr. Marin Olp would like to set the patient up with an upper endoscopy likely in  late September and a PET scan in early October.    ***                   Allergies:  is allergic to percocet [oxycodone-acetaminophen] and oxycodone-aspirin.  Meds: Current Outpatient Medications  Medication Sig Dispense Refill   acetaminophen (TYLENOL) 650 MG CR tablet Take 1,300 mg by mouth 2 (two) times daily. (Patient not taking: Reported on 12/25/2021)     alum & mag hydroxide-simeth (MAALOX/MYLANTA) 200-200-20 MG/5ML suspension Take 30 mLs by mouth daily. (Patient not taking: Reported on 12/25/2021)     capecitabine (XELODA) 500 MG tablet Take 3 tablets (1,500 mg total) by mouth daily. Take within 30 minutes after a meal. Start 2 days BEFORE radiation starts!!! Take on days of radiation thereafter. (Patient not taking: Reported on 01/01/2022) 90 tablet 0   gabapentin (NEURONTIN) 300 MG capsule Take 1 capsule (300 mg total) by mouth 3 (three) times daily. (Patient not taking: Reported on 12/02/2021) 90 capsule 3   HYDROcodone-acetaminophen (HYCET) 7.5-325 mg/15 ml solution Take 10 mLs by mouth every 6 (six) hours as needed for moderate pain. 250 mL 0   levothyroxine (SYNTHROID) 50 MCG tablet Take 50 mcg by mouth daily. (Patient not taking: Reported on 12/02/2021)     lisinopril-hydrochlorothiazide (ZESTORETIC) 20-12.5 MG tablet Take 1 tablet by mouth daily. 90 tablet 3   LORazepam (ATIVAN) 0.5 MG tablet Take 1 tablet (0.5 mg total) by mouth every 8 (eight) hours. (Patient not taking: Reported on 12/25/2021) 30 tablet 0  omeprazole (PRILOSEC) 40 MG capsule Take 1 capsule (40 mg total) by mouth 2 (two) times daily before a meal. 60 capsule 12   ondansetron (ZOFRAN) 8 MG tablet Take 1 tablet (8 mg total) by mouth every 8 (eight) hours as needed for nausea or vomiting. (Patient not taking: Reported on 12/25/2021) 20 tablet 0   polyethylene glycol (MIRALAX / GLYCOLAX) 17 g packet Take 17 g by mouth daily.     prochlorperazine (COMPAZINE) 10 MG tablet Take 1 tablet (10 mg total) by mouth every 6 (six)  hours as needed (Nausea or vomiting). 30 tablet 1   rOPINIRole (REQUIP) 2 MG tablet Take 2 mg by mouth at bedtime. (Patient not taking: Reported on 12/02/2021)     sucralfate (CARAFATE) 1 g tablet Take 1 tablet (1 g total) by mouth 4 (four) times daily -  with meals and at bedtime. Crush and dissolve and 10 mL of warm water prior to swallowing, take 20 minutes prior to meals (Patient not taking: Reported on 12/25/2021) 120 tablet 1   traMADol (ULTRAM) 50 MG tablet Take 50 mg by mouth every 6 (six) hours as needed.     No current facility-administered medications for this encounter.   Facility-Administered Medications Ordered in Other Encounters  Medication Dose Route Frequency Provider Last Rate Last Admin   heparin lock flush 100 unit/mL  500 Units Intravenous Once Ennever, Rudell Cobb, MD       HYDROcodone-acetaminophen (NORCO/VICODIN) 5-325 MG per tablet 1 tablet  1 tablet Oral Once Celso Amy, NP       sodium chloride flush (NS) 0.9 % injection 10 mL  10 mL Intravenous Once Volanda Napoleon, MD        Physical Findings: The patient is in no acute distress. Patient is alert and oriented.  vitals were not taken for this visit. .  No significant changes. Lungs are clear to auscultation bilaterally. Heart has regular rate and rhythm. No palpable cervical, supraclavicular, or axillary adenopathy. Abdomen soft, non-tender, normal bowel sounds.   Lab Findings: Lab Results  Component Value Date   WBC 5.0 01/22/2022   HGB 12.6 (L) 01/22/2022   HCT 38.5 (L) 01/22/2022   MCV 93.4 01/22/2022   PLT 251 01/22/2022    Radiographic Findings: No results found.  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   Stage IIB (T3N0M0) Gastric cancer, moderately differentiated adenocarcinoma  The patient is recovering from the effects of radiation.  ***  Plan:  ***   *** minutes of total time was spent for this patient encounter, including preparation,  face-to-face counseling with the patient and coordination of care, physical exam, and documentation of the encounter. ____________________________________  Blair Promise, PhD, MD  This document serves as a record of services personally performed by Gery Pray, MD. It was created on his behalf by Roney Mans, a trained medical scribe. The creation of this record is based on the scribe's personal observations and the provider's statements to them. This document has been checked and approved by the attending provider.

## 2022-02-15 ENCOUNTER — Encounter: Payer: Self-pay | Admitting: Hematology & Oncology

## 2022-02-15 ENCOUNTER — Other Ambulatory Visit: Payer: Self-pay

## 2022-02-15 ENCOUNTER — Ambulatory Visit
Admission: RE | Admit: 2022-02-15 | Discharge: 2022-02-15 | Disposition: A | Discharge status: Home / Self Care | Payer: Medicare HMO | Source: Ambulatory Visit | Attending: Radiation Oncology | Admitting: Radiation Oncology

## 2022-02-15 ENCOUNTER — Encounter: Payer: Self-pay | Admitting: Radiation Oncology

## 2022-02-15 DIAGNOSIS — C160 Malignant neoplasm of cardia: Secondary | ICD-10-CM | POA: Diagnosis not present

## 2022-02-15 HISTORY — DX: Personal history of irradiation: Z92.3

## 2022-02-15 NOTE — Progress Notes (Signed)
George Dyer is here today for follow up post radiation to the stomach.  They completed their radiation on: 01/12/22  Does the patient complain of any of the following:  Pain: Patient denies pain. Patient states," I just doesn't feel good." Abdominal bloating: No Diarrhea/Constipation: Constipation, patient miralax as needed.  Nausea/Vomiting: No Urinary Issues:No Post radiation skin changes: No Appetite:Good Weight: Wt Readings from Last 3 Encounters:  02/15/22 153 lb 9.6 oz (69.7 kg)  01/22/22 159 lb (72.1 kg)  01/01/22 155 lb (70.3 kg)    Additional comments if applicable:   BP 871/95 (BP Location: Left Arm, Patient Position: Sitting, Cuff Size: Normal)   Pulse 76   Temp 98.1 F (36.7 C)   Resp 20   Ht '5\' 9"'$  (1.753 m)   Wt 153 lb 9.6 oz (69.7 kg)   SpO2 100%   BMI 22.68 kg/m

## 2022-02-16 ENCOUNTER — Other Ambulatory Visit: Payer: Self-pay | Admitting: *Deleted

## 2022-02-17 ENCOUNTER — Encounter: Payer: Self-pay | Admitting: Hematology & Oncology

## 2022-02-17 MED ORDER — TEMAZEPAM 7.5 MG PO CAPS: 7.5000 mg | ORAL_CAPSULE | Freq: Every evening | ORAL | 0 refills | Status: DC | PRN

## 2022-02-19 ENCOUNTER — Encounter: Payer: Self-pay | Admitting: *Deleted

## 2022-02-19 NOTE — Progress Notes (Signed)
Patient needs and Endo and PET prior to his next office follow up. Endo has been scheduled for 02/25/22. PET scheduled for 03/11/2022.  Patient is aware of PET appointment including date, time, and location. The following prep is reviewed with patient and confirmed with teachback: - arrive 30 minutes before appointment time - NPO except water for 6h before scan. No candy, no gum - hold any diabetic medication the morning of the scan - have a low carb dinner the night prior Radiology Information sheet also mailed to patient's home for reinforcement of education.   Oncology Nurse Navigator Documentation     02/19/2022    8:45 AM  Oncology Nurse Navigator Flowsheets  Navigator Follow Up Date: 02/25/2022  Navigator Follow Up Reason: Other:  Navigator Location [facility]  Navigator Encounter Type Appt/Treatment Plan Review;Telephone  Telephone Outgoing Call;Appt Confirmation/Clarification;Education  Patient Visit Type MedOnc  Treatment Phase Active Tx  Barriers/Navigation Needs Coordination of Care;Education  Education Other  Interventions Coordination of Care;Education  Acuity Level 1-No Barriers  Coordination of Care Radiology  Education Method Verbal;Written  Support Groups/Services Friends and Family  Time Spent with Patient 30

## 2022-02-22 ENCOUNTER — Ambulatory Visit: Payer: Self-pay | Admitting: Radiation Oncology

## 2022-02-25 ENCOUNTER — Ambulatory Visit (AMBULATORY_SURGERY_CENTER): Payer: Medicare HMO

## 2022-02-25 VITALS — Ht 69.0 in | Wt 154.0 lb

## 2022-02-25 DIAGNOSIS — C169 Malignant neoplasm of stomach, unspecified: Secondary | ICD-10-CM

## 2022-02-25 NOTE — Progress Notes (Signed)
No egg or soy allergy known to patient  No issues known to pt with past sedation with any surgeries or procedures Patient denies ever being told they had issues or difficulty with intubation  No FH of Malignant Hyperthermia Pt is not on diet pills Pt is not on  home 02  Pt is not on blood thinners  Pt denies issues with constipation  No A fib or A flutter Have any cardiac testing pending--no Pt states he is no longer using Heparin Flush

## 2022-02-26 ENCOUNTER — Encounter: Payer: Self-pay | Admitting: *Deleted

## 2022-02-26 NOTE — Progress Notes (Signed)
Endoscopy now scheduled for 03/16/22.  Oncology Nurse Navigator Documentation     02/26/2022    8:15 AM  Oncology Nurse Navigator Flowsheets  Navigator Follow Up Date: 03/16/2022  Navigator Follow Up Reason: Other:  Navigator Location [facility]  Navigator Encounter Type Appt/Treatment Plan Review  Patient Visit Type MedOnc  Treatment Phase Active Tx  Barriers/Navigation Needs No Barriers At This Time  Interventions None Required  Acuity Level 1-No Barriers  Support Groups/Services Friends and Family  Time Spent with Patient 15

## 2022-03-02 ENCOUNTER — Encounter: Payer: Self-pay | Admitting: Gastroenterology

## 2022-03-11 ENCOUNTER — Encounter (HOSPITAL_COMMUNITY)
Admission: RE | Admit: 2022-03-11 | Discharge: 2022-03-11 | Disposition: A | Discharge status: Home / Self Care | Payer: Medicare HMO | Source: Ambulatory Visit | Attending: Hematology & Oncology | Admitting: Hematology & Oncology

## 2022-03-11 DIAGNOSIS — C160 Malignant neoplasm of cardia: Secondary | ICD-10-CM | POA: Diagnosis not present

## 2022-03-11 LAB — GLUCOSE, CAPILLARY: Glucose-Capillary: 90 mg/dL (ref 70–99)

## 2022-03-11 MED ORDER — FLUDEOXYGLUCOSE F - 18 (FDG) INJECTION
7.6000 | Freq: Once | INTRAVENOUS | Status: AC
  Administered 2022-03-11: 7.6 via INTRAVENOUS

## 2022-03-15 ENCOUNTER — Telehealth: Payer: Self-pay

## 2022-03-15 NOTE — Telephone Encounter (Signed)
-----   Message from Volanda Napoleon, MD sent at 03/12/2022  5:06 PM EDT ----- Call and let him know that the PET scan looks better.  He has had a response in the stomach.  There is no evidence of disease outside of the stomach.  This is a fantastic result.  Thanks.  Laurey Arrow

## 2022-03-16 ENCOUNTER — Encounter: Payer: Self-pay | Admitting: Gastroenterology

## 2022-03-16 ENCOUNTER — Ambulatory Visit (AMBULATORY_SURGERY_CENTER): Payer: Medicare HMO | Admitting: Gastroenterology

## 2022-03-16 ENCOUNTER — Encounter: Payer: Self-pay | Admitting: *Deleted

## 2022-03-16 VITALS — BP 124/80 | HR 79 | Temp 98.2°F | Resp 16 | Ht 69.0 in | Wt 154.0 lb

## 2022-03-16 DIAGNOSIS — C169 Malignant neoplasm of stomach, unspecified: Secondary | ICD-10-CM | POA: Diagnosis not present

## 2022-03-16 DIAGNOSIS — I10 Essential (primary) hypertension: Secondary | ICD-10-CM | POA: Diagnosis not present

## 2022-03-16 DIAGNOSIS — I8500 Esophageal varices without bleeding: Secondary | ICD-10-CM

## 2022-03-16 DIAGNOSIS — K259 Gastric ulcer, unspecified as acute or chronic, without hemorrhage or perforation: Secondary | ICD-10-CM

## 2022-03-16 MED ORDER — SODIUM CHLORIDE 0.9 % IV SOLN: 500.0000 mL | Freq: Once | INTRAVENOUS | Status: DC

## 2022-03-16 NOTE — Progress Notes (Signed)
Called to room to assist during endoscopic procedure.  Patient ID and intended procedure confirmed with present staff. Received instructions for my participation in the procedure from the performing physician.  

## 2022-03-16 NOTE — Progress Notes (Signed)
Report to PACU, RN, vss, BBS= Clear.  

## 2022-03-16 NOTE — Progress Notes (Signed)
Vitals-DT  Pt's states no medical or surgical changes since previsit or office visit.  

## 2022-03-16 NOTE — Progress Notes (Signed)
PET shows response without disease progression.   Oncology Nurse Navigator Documentation     03/16/2022    7:45 AM  Oncology Nurse Navigator Flowsheets  Navigator Follow Up Date: 03/18/2022  Navigator Follow Up Reason: Follow-up Appointment  Navigator Location [facility]  Navigator Encounter Type Scan Review  Patient Visit Type MedOnc  Treatment Phase Active Tx  Barriers/Navigation Needs No Barriers At This Time  Interventions None Required  Acuity Level 1-No Barriers  Support Groups/Services Friends and Family  Time Spent with Patient 15

## 2022-03-16 NOTE — Patient Instructions (Addendum)
-   Patient has a contact number available for emergencies. The signs and symptoms of potential delayed complications were discussed with the patient. Return to normal activities tomorrow. Written discharge instructions were provided to the patient. - Resume previous diet. - Continue present medications. - Await pathology results. - The findings and recommendations were discussed with the patient's family.  YOU HAD AN ENDOSCOPIC PROCEDURE TODAY AT [facility]:   Refer to the procedure report that was given to you for any specific questions about what was found during the examination.  If the procedure report does not answer your questions, please call your gastroenterologist to clarify.  If you requested that your care partner not be given the details of your procedure findings, then the procedure report has been included in a sealed envelope for you to review at your convenience later.  YOU SHOULD EXPECT: Some feelings of bloating in the abdomen. Passage of more gas than usual.  Walking can help get rid of the air that was put into your GI tract during the procedure and reduce the bloating. If you had a lower endoscopy (such as a colonoscopy or flexible sigmoidoscopy) you may notice spotting of blood in your stool or on the toilet paper. If you underwent a bowel prep for your procedure, you may not have a normal bowel movement for a few days.  Please Note:  You might notice some irritation and congestion in your nose or some drainage.  This is from the oxygen used during your procedure.  There is no need for concern and it should clear up in a day or so.  SYMPTOMS TO REPORT IMMEDIATELY:  Following upper endoscopy (EGD)  Vomiting of blood or coffee ground material  New chest pain or pain under the shoulder blades  Painful or persistently difficult swallowing  New shortness of breath  Fever of 100F or higher  Black, tarry-looking stools  For urgent or emergent issues, a  gastroenterologist can be reached at any hour by calling [phone]. Do not use MyChart messaging for urgent concerns.    DIET:  We do recommend a small meal at first, but then you may proceed to your regular diet.  Drink plenty of fluids but you should avoid alcoholic beverages for 24 hours.  ACTIVITY:  You should plan to take it easy for the rest of today and you should NOT DRIVE or use heavy machinery until tomorrow (because of the sedation medicines used during the test).    FOLLOW UP: Our staff will call the number listed on your records the next business day following your procedure.  We will call around 7:15- 8:00 am to check on you and address any questions or concerns that you may have regarding the information given to you following your procedure. If we do not reach you, we will leave a message.     If any biopsies were taken you will be contacted by phone or by letter within the next 1-3 weeks.  Please call us at [phone] if you have not heard about the biopsies in 3 weeks.    SIGNATURES/CONFIDENTIALITY: You and/or your care partner have signed paperwork which will be entered into your electronic medical record.  These signatures attest to the fact that that the information above on your After Visit Summary has been reviewed and is understood.  Full responsibility of the confidentiality of this discharge information lies with you and/or your care-partner.

## 2022-03-16 NOTE — Op Note (Signed)
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                            Refused Sx. PET scan with stable tumor. Being                            closely followed by Dr. Marin Olp Medicines:                Monitored Anesthesia Care Procedure:                Pre-Anesthesia Assessment:                           - Prior to the procedure, a History and Physical                            was performed, and patient medications and                            allergies were reviewed. The patient's tolerance of                            previous anesthesia was also reviewed. The risks                            and benefits of the procedure and the sedation                            options and risks were discussed with the patient.                            All questions were answered, and informed consent                            was obtained. Prior Anticoagulants: The patient has                            taken no previous anticoagulant or antiplatelet                            agents. ASA Grade Assessment: III - A patient with                            severe systemic disease. After reviewing the risks                            and benefits, the patient was deemed in  satisfactory condition to undergo the procedure.                           After obtaining informed consent, the endoscope was                            passed under direct vision. Throughout the                            procedure, the patient's blood pressure, pulse, and                            oxygen saturations were monitored continuously. The                            Endoscope was introduced  through the mouth, and                            advanced to the second part of duodenum. The upper                            GI endoscopy was accomplished without difficulty.                            The patient tolerated the procedure well. Scope In: Scope Out: Findings:                 Grade I varices with no bleeding and no stigmata of                            recent bleeding were found in the entire                            esophagus,. They were 4 mm in largest diameter.                           A large, polypoid indurated area involving one                            third of the lumen, measuring appox 5 cm x 4 cm                            with superficial ulcers ulcerated, with no bleeding                            and no stigmata of recent bleeding was found in the                            cardia. There was surrounding telangiectasia                            consistent with radiation changes. This mass was  friable and would bleed easily. Biopsies were taken                            with a cold forceps for histology.                           The exam was otherwise without abnormality. Complications:            No immediate complications. Estimated Blood Loss:     Estimated blood loss: none. Impression:               - Grade I esophageal varices with no bleeding and                            no stigmata of recent bleeding.                           - Likely malignant residual gastric tumor in the                            cardia with surrounding radiation changes (better                            as compared to previous EGD). Biopsied.                           - The examination was otherwise normal. Recommendation:           - Patient has a contact number available for                            emergencies. The signs and symptoms of potential                            delayed complications were discussed with the                             patient. Return to normal activities tomorrow.                            Written discharge instructions were provided to the                            patient.                           - Resume previous diet.                           - Continue present medications.                           - Await pathology results.                           - The findings and recommendations were discussed  with the patient's family. Jackquline Denmark, MD 03/16/2022 2:35:52 PM This report has been signed electronically.

## 2022-03-16 NOTE — Progress Notes (Signed)
[facility] History and Physical   Primary Care Physician:  Gweneth Fritter, FNP   Reason for Procedure:   FU gastric adenoca (Dx 08/2021) s/p chemo-XRT. Refused Sx. PET scan with stable tumor. Being closely followed by Dr. Marin Olp  Plan:    EGD     HPI: George Dyer is a 81 y.o. male    Past Medical History:  Diagnosis Date   Anxiety    Arthritis    Constipation    ED (erectile dysfunction)    GERD (gastroesophageal reflux disease)    Goals of care, counseling/discussion 08/18/2021   Headache    History of radiation therapy    Stomach- 12/09/21-01/12/22-Dr. 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   COLONOSCOPY  08/06/2021   [name]   ESOPHAGOGASTRODUODENOSCOPY (EGD) WITH PROPOFOL N/A 08/13/2021   Procedure: ESOPHAGOGASTRODUODENOSCOPY (EGD) WITH PROPOFOL;  Surgeon: Irving Copas., MD;  Location: [place];  Service: Gastroenterology;  Laterality: N/A;   EUS N/A 08/13/2021   Procedure: UPPER ENDOSCOPIC ULTRASOUND (EUS) RADIAL;  Surgeon: Irving Copas., MD;  Location: [place];  Service: Gastroenterology;  Laterality: N/A;   INGUINAL HERNIA REPAIR  07/10/2012   Procedure: LAPAROSCOPIC INGUINAL HERNIA;  Surgeon: Ralene Ok, MD;  Location: [place];  Service: General;  Laterality: Right;   INGUINAL HERNIA REPAIR  07/10/2012   [facility]   INSERTION OF MESH  07/10/2012   Procedure: INSERTION OF MESH;  Surgeon: Ralene Ok, MD;  Location: [place];   Service: General;  Laterality: Right;   IR PATIENT EVAL TECH 0-60 MINS  11/26/2021   LUMBAR DISC SURGERY     ORIF HUMERUS FRACTURE Left 01/13/2016   Procedure: OPEN REDUCTION INTERNAL FIXATION (ORIF) LEFT PROXIMAL HUMERUS FRACTURE;  Surgeon: Roseanne Kaufman, MD;  Location: [place];  Service: Orthopedics;  Laterality: Left;   TONSILLECTOMY     US ECHOCARDIOGRAPHY  04/20/1993   EF 65%    Prior to Admission medications   Medication Sig Start Date End Date Taking? Authorizing Provider  acetaminophen (TYLENOL) 650 MG CR tablet Take 1,300 mg by mouth 2 (two) times daily.   Yes [provider]  alum & mag hydroxide-simeth (MAALOX/MYLANTA) 200-200-20 MG/5ML suspension Take 30 mLs by mouth daily.   Yes [provider]  levothyroxine (SYNTHROID) 50 MCG tablet Take 50 mcg by mouth daily. 10/09/21  Yes [provider]  ondansetron (ZOFRAN) 8 MG tablet Take 1 tablet (8 mg total) by mouth every 8 (eight) hours as needed for nausea or vomiting. 12/22/21  Yes Ennever, Rudell Cobb, MD  polyethylene glycol (MIRALAX / GLYCOLAX) 17 g packet Take 17 g by mouth daily.   Yes [provider]  temazepam (RESTORIL) 7.5 MG capsule Take 1 capsule (7.5 mg total) by mouth at bedtime as needed for sleep. 02/17/22  Yes Ennever, Rudell Cobb, MD  traMADol (ULTRAM) 50 MG tablet Take 50 mg by mouth every 6 (  six) hours as needed. 08/24/21  Yes [provider]  capecitabine (XELODA) 500 MG tablet Take 3 tablets (1,500 mg total) by mouth daily. Take within 30 minutes after a meal. Start 2 days BEFORE radiation starts!!! Take on days of radiation thereafter. Patient not taking: Reported on 01/01/2022 12/22/21   Volanda Napoleon, MD  lisinopril-hydrochlorothiazide (ZESTORETIC) 20-12.5 MG tablet Take 1 tablet by mouth daily. Patient not taking: Reported on 02/15/2022 07/15/21   Lenna Sciara, NP  omeprazole (PRILOSEC) 40 MG capsule Take 1 capsule (40 mg total) by mouth 2 (two) times daily before a meal. 08/13/21    Mansouraty, Telford Nab., MD  prochlorperazine (COMPAZINE) 10 MG tablet Take 1 tablet (10 mg total) by mouth every 6 (six) hours as needed (Nausea or vomiting). Patient not taking: Reported on 03/16/2022 12/22/21   Volanda Napoleon, MD    Current Outpatient Medications  Medication Sig Dispense Refill   acetaminophen (TYLENOL) 650 MG CR tablet Take 1,300 mg by mouth 2 (two) times daily.     alum & mag hydroxide-simeth (MAALOX/MYLANTA) 200-200-20 MG/5ML suspension Take 30 mLs by mouth daily.     levothyroxine (SYNTHROID) 50 MCG tablet Take 50 mcg by mouth daily.     ondansetron (ZOFRAN) 8 MG tablet Take 1 tablet (8 mg total) by mouth every 8 (eight) hours as needed for nausea or vomiting. 20 tablet 0   polyethylene glycol (MIRALAX / GLYCOLAX) 17 g packet Take 17 g by mouth daily.     temazepam (RESTORIL) 7.5 MG capsule Take 1 capsule (7.5 mg total) by mouth at bedtime as needed for sleep. 30 capsule 0   traMADol (ULTRAM) 50 MG tablet Take 50 mg by mouth every 6 (six) hours as needed.     capecitabine (XELODA) 500 MG tablet Take 3 tablets (1,500 mg total) by mouth daily. Take within 30 minutes after a meal. Start 2 days BEFORE radiation starts!!! Take on days of radiation thereafter. (Patient not taking: Reported on 01/01/2022) 90 tablet 0   lisinopril-hydrochlorothiazide (ZESTORETIC) 20-12.5 MG tablet Take 1 tablet by mouth daily. (Patient not taking: Reported on 02/15/2022) 90 tablet 3   omeprazole (PRILOSEC) 40 MG capsule Take 1 capsule (40 mg total) by mouth 2 (two) times daily before a meal. 60 capsule 12   prochlorperazine (COMPAZINE) 10 MG tablet Take 1 tablet (10 mg total) by mouth every 6 (six) hours as needed (Nausea or vomiting). (Patient not taking: Reported on 03/16/2022) 30 tablet 1   No current facility-administered medications for this visit.   Facility-Administered Medications Ordered in Other Visits  Medication Dose Route Frequency Provider Last Rate Last Admin   heparin lock  flush 100 unit/mL  500 Units Intravenous Once Ennever, Rudell Cobb, MD       HYDROcodone-acetaminophen (NORCO/VICODIN) 5-325 MG per tablet 1 tablet  1 tablet Oral Once Celso Amy, NP       sodium chloride flush (NS) 0.9 % injection 10 mL  10 mL Intravenous Once Volanda Napoleon, MD        Allergies as of 03/16/2022 - Review Complete 03/16/2022  Allergen Reaction Noted   Percocet [oxycodone-acetaminophen] Nausea And Vomiting 01/12/2016   Oxycodone-aspirin Nausea And Vomiting 01/04/2016    Family History  Problem Relation Age of Onset   Hypertension Mother    Heart attack Father    Heart disease Father    Cancer Maternal Aunt        breast   Esophageal cancer Maternal Uncle    Cancer  Maternal Uncle    Colon cancer Neg Hx    Stomach cancer Neg Hx    Rectal cancer Neg Hx     Social History   Socioeconomic History   Marital status: Married    Spouse name: Not on file   Number of children: 3   Years of education: Not on file   Highest education level: Not on file  Occupational History   Occupation: self employed  Tobacco Use   Smoking status: Former    Packs/day: 0.25    Years: 6.00    Total pack years: 1.50    Types: Cigarettes    Quit date: 03/25/1981    Years since quitting: 41.0   Smokeless tobacco: Never  Vaping Use   Vaping Use: Never used  Substance and Sexual Activity   Alcohol use: No   Drug use: No   Sexual activity: Not on file  Other Topics Concern   Not on file  Social History Narrative   Not on file   Social Determinants of Health   Financial Resource Strain: Not on file  Food Insecurity: Not on file  Transportation Needs: Not on file  Physical Activity: Not on file  Stress: Not on file  Social Connections: Not on file  Intimate Partner Violence: Not on file    Review of Systems: Positive for none All other review of systems negative except as mentioned in the HPI.  Physical Exam: Vital signs in last 24 hours: '@VSRANGES'$ @   General:    Alert,  Well-developed, well-nourished, pleasant and cooperative in NAD Lungs:  Clear throughout to auscultation.   Heart:  Regular rate and rhythm; no murmurs, clicks, rubs,  or gallops. Abdomen:  Soft, nontender and nondistended. Normal bowel sounds.   Neuro/Psych:  Alert and cooperative. Normal mood and affect. A and O x 3    No significant changes were identified.  The patient continues to be an appropriate candidate for the planned procedure and anesthesia.   Carmell Austria, MD. [facility] 03/16/2022 2:09 PM@

## 2022-03-17 ENCOUNTER — Telehealth: Payer: Self-pay | Admitting: *Deleted

## 2022-03-17 NOTE — Telephone Encounter (Signed)
  Follow up Call-     03/16/2022    1:49 PM 08/06/2021   12:43 PM  Call back number  Post procedure Call Back phone  # [phone]  Permission to leave phone message Yes Yes     Patient questions:  Do you have a fever, pain , or abdominal swelling? No. Pain Score  0 *  Have you tolerated food without any problems? Yes.    Have you been able to return to your normal activities? Yes.    Do you have any questions about your discharge instructions: Diet   No. Medications  No. Follow up visit  No.  Do you have questions or concerns about your Care? No.  Actions: * If pain score is 4 or above: No action needed, pain <4.

## 2022-03-18 ENCOUNTER — Inpatient Hospital Stay: Payer: Medicare HMO | Attending: Hematology & Oncology

## 2022-03-18 ENCOUNTER — Other Ambulatory Visit: Payer: Self-pay

## 2022-03-18 ENCOUNTER — Inpatient Hospital Stay: Payer: Medicare HMO | Admitting: Hematology & Oncology

## 2022-03-18 ENCOUNTER — Encounter: Payer: Self-pay | Admitting: *Deleted

## 2022-03-18 ENCOUNTER — Encounter: Payer: Self-pay | Admitting: Hematology & Oncology

## 2022-03-18 VITALS — BP 127/60 | HR 71 | Temp 97.6°F | Resp 19 | Ht 69.0 in | Wt 151.4 lb

## 2022-03-18 DIAGNOSIS — C162 Malignant neoplasm of body of stomach: Secondary | ICD-10-CM | POA: Diagnosis not present

## 2022-03-18 DIAGNOSIS — C160 Malignant neoplasm of cardia: Secondary | ICD-10-CM

## 2022-03-18 DIAGNOSIS — G63 Polyneuropathy in diseases classified elsewhere: Secondary | ICD-10-CM

## 2022-03-18 DIAGNOSIS — G629 Polyneuropathy, unspecified: Secondary | ICD-10-CM | POA: Diagnosis not present

## 2022-03-18 DIAGNOSIS — I739 Peripheral vascular disease, unspecified: Secondary | ICD-10-CM | POA: Diagnosis not present

## 2022-03-18 LAB — IRON AND IRON BINDING CAPACITY (CC-WL,HP ONLY)
Iron: 81 ug/dL (ref 45–182)
Saturation Ratios: 25 % (ref 17.9–39.5)
TIBC: 323 ug/dL (ref 250–450)
UIBC: 242 ug/dL (ref 117–376)

## 2022-03-18 LAB — CMP (CANCER CENTER ONLY)
ALT: 15 U/L (ref 0–44)
AST: 13 U/L — ABNORMAL LOW (ref 15–41)
Albumin: 4.2 g/dL (ref 3.5–5.0)
Alkaline Phosphatase: 96 U/L (ref 38–126)
Anion gap: 8 (ref 5–15)
BUN: 20 mg/dL (ref 8–23)
CO2: 28 mmol/L (ref 22–32)
Calcium: 9.9 mg/dL (ref 8.9–10.3)
Chloride: 102 mmol/L (ref 98–111)
Creatinine: 1.13 mg/dL (ref 0.61–1.24)
GFR, Estimated: >60 mL/min (ref >60)
Glucose, Bld: 99 mg/dL (ref 70–99)
Potassium: 4.2 mmol/L (ref 3.5–5.1)
Sodium: 138 mmol/L (ref 135–145)
Total Bilirubin: 0.5 mg/dL (ref 0.3–1.2)
Total Protein: 7 g/dL (ref 6.5–8.1)

## 2022-03-18 LAB — CBC WITH DIFFERENTIAL (CANCER CENTER ONLY)
Abs Immature Granulocytes: 0.02 10*3/uL (ref 0.00–0.07)
Basophils Absolute: 0.1 10*3/uL (ref 0.0–0.1)
Basophils Relative: 1 %
Eosinophils Absolute: 0.2 10*3/uL (ref 0.0–0.5)
Eosinophils Relative: 4 %
HCT: 41.1 % (ref 39.0–52.0)
Hemoglobin: 13.4 g/dL (ref 13.0–17.0)
Immature Granulocytes: 0 %
Lymphocytes Relative: 23 %
Lymphs Abs: 1.4 10*3/uL (ref 0.7–4.0)
MCH: 29.5 pg (ref 26.0–34.0)
MCHC: 32.6 g/dL (ref 30.0–36.0)
MCV: 90.3 fL (ref 80.0–100.0)
Monocytes Absolute: 0.7 10*3/uL (ref 0.1–1.0)
Monocytes Relative: 11 %
Neutro Abs: 3.6 10*3/uL (ref 1.7–7.7)
Neutrophils Relative %: 61 %
Platelet Count: 287 10*3/uL (ref 150–400)
RBC: 4.55 MIL/uL (ref 4.22–5.81)
RDW: 12.9 % (ref 11.5–15.5)
WBC Count: 5.9 10*3/uL (ref 4.0–10.5)
nRBC: 0 % (ref 0.0–0.2)

## 2022-03-18 LAB — FERRITIN: Ferritin: 67 ng/mL (ref 24–336)

## 2022-03-18 LAB — LACTATE DEHYDROGENASE: LDH: 95 U/L — ABNORMAL LOW (ref 98–192)

## 2022-03-18 NOTE — Progress Notes (Signed)
Patient has completed PET and endoscopy. Endo showed likely residual tumor in the gastric cardia with pathology still pending. If pathology shows residual disease, [facility] One will be sent.   Oncology Nurse Navigator Documentation     03/18/2022    9:00 AM  Oncology Nurse Navigator Flowsheets  Navigator Follow Up Date: 03/23/2022  Navigator Follow Up Reason: Pathology  Navigator Location [facility]  Navigator Encounter Type Follow-up Appt;Appt/Treatment Plan Review  Patient Visit Type MedOnc  Treatment Phase Active Tx  Barriers/Navigation Needs No Barriers At This Time  Interventions None Required  Acuity Level 1-No Barriers  Support Groups/Services Friends and Family  Time Spent with Patient 15

## 2022-03-18 NOTE — Progress Notes (Signed)
Hematology and Oncology Follow Up Visit  George Dyer [id number] 08/02/1940 81 y.o. 03/18/2022   Principle Diagnosis:  Stage IIB (T3N0M0) Gastric cancer -- declines surgery  Current Therapy:   FOLFOX - s/p cycle #4-  start on 09/15/2021 XRT/Xeloda -- start on 12/07/2021 --completed on 01/12/2022     Interim History:  George Dyer is back for follow-up.  George Dyer does look better.  George Dyer had a upper endoscopy a couple days ago.  There was still some abnormality in the gastric antrum.  This was biopsied.  Include does look better than previous.  As such, I believe that George Dyer had a good response to the radiation and Xeloda.  We will be interesting to see what the biopsy shows.  If this is active malignancy, I will send it off for molecular analysis to see if there is any molecular mutations that we can target.  George Dyer also had a PET scan done.  The PET scan was done on 03/11/2022.  The PET scan only showed some activity in the proximal stomach.  This was decreased as to what George Dyer had previously.  The SUV is now down to 6.9.  Again, George Dyer does not wish to have any surgery.  George Dyer would really prefer to continue on some form of therapy.  I told his family that George Dyer has already had his radiation therapy.  I cannot imagine that George Dyer could have any additional radiation therapy.  George Dyer has had no problems with pain although George Dyer is having issues with his legs.  George Dyer is having a lot of problems with neuropathy in the legs.  I am unsure if this is from chemotherapy of this is some that George Dyer has had chronically.  George Dyer probably needs to be referred to neurology.  George Dyer has had no bleeding.  George Dyer has had no problems with bowels or bladder.  Again George Dyer is eating better.  His wife doing a great job in giving him nutrition.  Currently, I would say that his performance status is probably ECOG 1.    Medications:  Current Outpatient Medications:    acetaminophen (TYLENOL) 650 MG CR tablet, Take 1,300 mg by mouth 2 (two) times daily., Disp: ,  Rfl:    alum & mag hydroxide-simeth (MAALOX/MYLANTA) 200-200-20 MG/5ML suspension, Take 30 mLs by mouth daily., Disp: , Rfl:    levothyroxine (SYNTHROID) 50 MCG tablet, Take 50 mcg by mouth daily., Disp: , Rfl:    omeprazole (PRILOSEC) 40 MG capsule, Take 1 capsule (40 mg total) by mouth 2 (two) times daily before a meal., Disp: 60 capsule, Rfl: 12   ondansetron (ZOFRAN) 8 MG tablet, Take 1 tablet (8 mg total) by mouth every 8 (eight) hours as needed for nausea or vomiting., Disp: 20 tablet, Rfl: 0   polyethylene glycol (MIRALAX / GLYCOLAX) 17 g packet, Take 17 g by mouth daily., Disp: , Rfl:    temazepam (RESTORIL) 7.5 MG capsule, Take 1 capsule (7.5 mg total) by mouth at bedtime as needed for sleep., Disp: 30 capsule, Rfl: 0   traMADol (ULTRAM) 50 MG tablet, Take 50 mg by mouth every 6 (six) hours as needed., Disp: , Rfl:    capecitabine (XELODA) 500 MG tablet, Take 3 tablets (1,500 mg total) by mouth daily. Take within 30 minutes after a meal. Start 2 days BEFORE radiation starts!!! Take on days of radiation thereafter. (Patient not taking: Reported on 01/01/2022), Disp: 90 tablet, Rfl: 0   lisinopril-hydrochlorothiazide (ZESTORETIC) 20-12.5 MG tablet, Take 1 tablet by mouth daily. (Patient  not taking: Reported on 02/15/2022), Disp: 90 tablet, Rfl: 3   prochlorperazine (COMPAZINE) 10 MG tablet, Take 1 tablet (10 mg total) by mouth every 6 (six) hours as needed (Nausea or vomiting). (Patient not taking: Reported on 03/16/2022), Disp: 30 tablet, Rfl: 1 No current facility-administered medications for this visit.  Facility-Administered Medications Ordered in Other Visits:    heparin lock flush 100 unit/mL, 500 Units, Intravenous, Once, [place], Rudell Cobb, MD   HYDROcodone-acetaminophen (NORCO/VICODIN) 5-325 MG per tablet 1 tablet, 1 tablet, Oral, Once, Celso Amy, NP   sodium chloride flush (NS) 0.9 % injection 10 mL, 10 mL, Intravenous, Once, Kajsa Butrum, Rudell Cobb, MD  Allergies:  Allergies   Allergen Reactions   Percocet [Oxycodone-Acetaminophen] Nausea And Vomiting   Oxycodone-Aspirin Nausea And Vomiting    Past Medical History, Surgical history, Social history, and Family History were reviewed and updated.  Review of Systems: Review of Systems  Constitutional:  Positive for fatigue.  HENT:  Negative.    Eyes: Negative.   Respiratory: Negative.    Cardiovascular: Negative.   Gastrointestinal:  Positive for nausea.  Endocrine: Negative.   Genitourinary: Negative.    Musculoskeletal: Negative.   Skin: Negative.   Neurological: Negative.   Hematological: Negative.   Psychiatric/Behavioral: Negative.      Physical Exam:  height is '5\' 9"'$  (1.753 m) and weight is 151 lb 6.4 oz (68.7 kg). His oral temperature is 97.6 F (36.4 C). His blood pressure is 127/60 and his pulse is 71. His respiration is 19 and oxygen saturation is 99%.   Wt Readings from Last 3 Encounters:  03/18/22 151 lb 6.4 oz (68.7 kg)  03/16/22 154 lb (69.9 kg)  02/25/22 154 lb (69.9 kg)    Physical Exam Vitals reviewed.  HENT:     Head: Normocephalic and atraumatic.  Eyes:     Pupils: Pupils are equal, round, and reactive to light.  Cardiovascular:     Rate and Rhythm: Normal rate and regular rhythm.     Heart sounds: Normal heart sounds.  Pulmonary:     Effort: Pulmonary effort is normal.     Breath sounds: Normal breath sounds.  Abdominal:     General: Bowel sounds are normal.     Palpations: Abdomen is soft.  Musculoskeletal:        General: No tenderness or deformity. Normal range of motion.     Cervical back: Normal range of motion.  Lymphadenopathy:     Cervical: No cervical adenopathy.  Skin:    General: Skin is warm and dry.     Findings: No erythema or rash.  Neurological:     Mental Status: George Dyer is alert and oriented to person, place, and time.  Psychiatric:        Behavior: Behavior normal.        Thought Content: Thought content normal.        Judgment: Judgment normal.    Lab Results  Component Value Date   WBC 5.9 03/18/2022   HGB 13.4 03/18/2022   HCT 41.1 03/18/2022   MCV 90.3 03/18/2022   PLT 287 03/18/2022     Chemistry      Component Value Date/Time   NA 138 03/18/2022 0823   NA 139 07/23/2020 0818   K 4.2 03/18/2022 0823   CL 102 03/18/2022 0823   CO2 28 03/18/2022 0823   BUN 20 03/18/2022 0823   BUN 10 07/23/2020 0818   CREATININE 1.13 03/18/2022 0823   CREATININE 1.06 06/27/2015 6195  Component Value Date/Time   CALCIUM 9.9 03/18/2022 0823   ALKPHOS 96 03/18/2022 0823   AST 13 (L) 03/18/2022 0823   ALT 15 03/18/2022 0823   BILITOT 0.5 03/18/2022 0823      Impression and Plan: George Dyer is a very nice 81 year old white male.  George Dyer has what I hope to believe localized gastric cancer.   George Dyer has had 4 cycles of chemotherapy.  By the follow-up scans and endoscopy, George Dyer really did not have much of a response.  We subsequently gave him radiation with chemotherapy.  George Dyer had low-dose Xeloda.  I saw to worry that George Dyer has residual disease.  If George Dyer does, we will still have to treat this.  Again, a lot will depend on what the biopsy shows.  I am just glad that George Dyer is doing better.  I know that George Dyer had a really tough time with the radiation therapy and Xeloda.  Again his problem right now is the legs.  George Dyer has this chronic neuropathy.  We will see if neurology can help him out.  Once we have the pathology results back, and the molecular analysis results, if the tumor is active, then we will plan to get him back and figure out how we can continue to treat him.  His family is doing a wonderful job helping him.  George Dyer has such a great support system with his family.    Volanda Napoleon, MD 10/12/20239:15 AM

## 2022-03-19 ENCOUNTER — Encounter: Payer: Self-pay | Admitting: Hematology & Oncology

## 2022-03-19 ENCOUNTER — Encounter: Payer: Self-pay | Admitting: *Deleted

## 2022-03-19 NOTE — Progress Notes (Signed)
Per Dr Marin Olp, request for [facility] testing sent on specimen [id number] DOS 03/16/2022.  Oncology Nurse Navigator Documentation     03/19/2022   12:00 PM  Oncology Nurse Navigator Flowsheets  Navigator Location [facility]  Navigator Encounter Type Molecular Studies  Patient Visit Type MedOnc  Treatment Phase Active Tx  Barriers/Navigation Needs Coordination of Care  Interventions Coordination of Care  Acuity Level 1-No Barriers  Coordination of Care Pathology  Support Groups/Services Friends and Family  Time Spent with Patient 30

## 2022-03-21 ENCOUNTER — Encounter: Payer: Self-pay | Admitting: Gastroenterology

## 2022-03-22 ENCOUNTER — Telehealth: Payer: Self-pay | Admitting: *Deleted

## 2022-03-22 ENCOUNTER — Encounter: Payer: Self-pay | Admitting: Neurology

## 2022-03-22 NOTE — Telephone Encounter (Signed)
Received confirmation that Order for [place] has been received.

## 2022-03-24 ENCOUNTER — Other Ambulatory Visit: Payer: Self-pay | Admitting: Hematology & Oncology

## 2022-03-24 MED ORDER — TEMAZEPAM 7.5 MG PO CAPS: 7.5000 mg | ORAL_CAPSULE | Freq: Every evening | ORAL | 0 refills | Status: DC | PRN

## 2022-03-30 ENCOUNTER — Encounter: Payer: Self-pay | Admitting: *Deleted

## 2022-03-31 DIAGNOSIS — C160 Malignant neoplasm of cardia: Secondary | ICD-10-CM | POA: Diagnosis not present

## 2022-04-01 ENCOUNTER — Encounter: Payer: Self-pay | Admitting: *Deleted

## 2022-04-01 NOTE — Progress Notes (Signed)
Patient's [facility] One has resulted and Dr Marin Olp needs patient to come in for discussion of results and plan for treatment.   Called and spoke to patient's wife, Janan Halter, and she is aware of new appointment including date, time and location.   Oncology Nurse Navigator Documentation     04/01/2022   10:30 AM  Oncology Nurse Navigator Flowsheets  Navigator Follow Up Date: 04/05/2022  Navigator Follow Up Reason: Follow-up After Biopsy  Navigator Location [facility]  Navigator Encounter Type Appt/Treatment Plan Review;Telephone  Telephone Diagnostic Results;Appt Confirmation/Clarification;Outgoing Call  Patient Visit Type MedOnc  Treatment Phase Active Tx  Barriers/Navigation Needs Coordination of Care  Education Other  Interventions Coordination of Care;Education  Acuity Level 1-No Barriers  Coordination of Care Appts  Education Method Verbal  Support Groups/Services Friends and Family  Time Spent with Patient 57

## 2022-04-02 ENCOUNTER — Encounter: Payer: Self-pay | Admitting: Gastroenterology

## 2022-04-02 ENCOUNTER — Other Ambulatory Visit: Payer: Self-pay | Admitting: *Deleted

## 2022-04-02 DIAGNOSIS — G63 Polyneuropathy in diseases classified elsewhere: Secondary | ICD-10-CM

## 2022-04-02 DIAGNOSIS — Z95828 Presence of other vascular implants and grafts: Secondary | ICD-10-CM

## 2022-04-02 DIAGNOSIS — C160 Malignant neoplasm of cardia: Secondary | ICD-10-CM

## 2022-04-02 DIAGNOSIS — C162 Malignant neoplasm of body of stomach: Secondary | ICD-10-CM

## 2022-04-05 ENCOUNTER — Encounter: Payer: Self-pay | Admitting: Hematology & Oncology

## 2022-04-05 ENCOUNTER — Inpatient Hospital Stay (HOSPITAL_BASED_OUTPATIENT_CLINIC_OR_DEPARTMENT_OTHER): Payer: Medicare HMO | Admitting: Hematology & Oncology

## 2022-04-05 ENCOUNTER — Inpatient Hospital Stay: Payer: Medicare HMO

## 2022-04-05 VITALS — BP 133/61 | HR 73 | Temp 98.1°F | Resp 20 | Ht 69.0 in | Wt 150.0 lb

## 2022-04-05 DIAGNOSIS — C162 Malignant neoplasm of body of stomach: Secondary | ICD-10-CM

## 2022-04-05 DIAGNOSIS — G63 Polyneuropathy in diseases classified elsewhere: Secondary | ICD-10-CM

## 2022-04-05 DIAGNOSIS — C160 Malignant neoplasm of cardia: Secondary | ICD-10-CM

## 2022-04-05 DIAGNOSIS — G629 Polyneuropathy, unspecified: Secondary | ICD-10-CM | POA: Diagnosis not present

## 2022-04-05 DIAGNOSIS — Z95828 Presence of other vascular implants and grafts: Secondary | ICD-10-CM

## 2022-04-05 LAB — CMP (CANCER CENTER ONLY)
ALT: 14 U/L (ref 0–44)
AST: 15 U/L (ref 15–41)
Albumin: 4.2 g/dL (ref 3.5–5.0)
Alkaline Phosphatase: 87 U/L (ref 38–126)
Anion gap: 8 (ref 5–15)
BUN: 20 mg/dL (ref 8–23)
CO2: 27 mmol/L (ref 22–32)
Calcium: 9.7 mg/dL (ref 8.9–10.3)
Chloride: 103 mmol/L (ref 98–111)
Creatinine: 1.16 mg/dL (ref 0.61–1.24)
GFR, Estimated: >60 mL/min (ref >60)
Glucose, Bld: 101 mg/dL — ABNORMAL HIGH (ref 70–99)
Potassium: 4.3 mmol/L (ref 3.5–5.1)
Sodium: 138 mmol/L (ref 135–145)
Total Bilirubin: 0.5 mg/dL (ref 0.3–1.2)
Total Protein: 6.5 g/dL (ref 6.5–8.1)

## 2022-04-05 LAB — CBC WITH DIFFERENTIAL (CANCER CENTER ONLY)
Abs Immature Granulocytes: 0.01 10*3/uL (ref 0.00–0.07)
Basophils Absolute: 0.1 10*3/uL (ref 0.0–0.1)
Basophils Relative: 2 %
Eosinophils Absolute: 0.1 10*3/uL (ref 0.0–0.5)
Eosinophils Relative: 3 %
HCT: 40.8 % (ref 39.0–52.0)
Hemoglobin: 13.1 g/dL (ref 13.0–17.0)
Immature Granulocytes: 0 %
Lymphocytes Relative: 23 %
Lymphs Abs: 1.1 10*3/uL (ref 0.7–4.0)
MCH: 29 pg (ref 26.0–34.0)
MCHC: 32.1 g/dL (ref 30.0–36.0)
MCV: 90.3 fL (ref 80.0–100.0)
Monocytes Absolute: 0.6 10*3/uL (ref 0.1–1.0)
Monocytes Relative: 14 %
Neutro Abs: 2.7 10*3/uL (ref 1.7–7.7)
Neutrophils Relative %: 58 %
Platelet Count: 302 10*3/uL (ref 150–400)
RBC: 4.52 MIL/uL (ref 4.22–5.81)
RDW: 12.9 % (ref 11.5–15.5)
WBC Count: 4.6 10*3/uL (ref 4.0–10.5)
nRBC: 0 % (ref 0.0–0.2)

## 2022-04-05 NOTE — Progress Notes (Signed)
Hematology and Oncology Follow Up Visit  George Dyer [id number] [birth year] 81 y.o. 04/05/2022   Principle Diagnosis:  Stage IIB (T3N0M0) Gastric cancer -- declines surgery  Current Therapy:   FOLFOX - s/p cycle #4-  start on 09/15/2021 XRT/Xeloda -- start on 12/07/2021 --completed on 01/12/2022     Interim History:  George Dyer is back for follow-up.  We did do molecular studies on his cancer.  There is no targeted abnormality for gastric cancer.  He did have a BRCA1 mutation.  I am unsure exactly what this signifies.  He also had a variant of BRAF.  Again, I am unsure if targeted therapy would be useful for either of these mutations.  I will reach out to one of my gastrointestinal colleagues and see what they have to say.  Otherwise, I think the treatment probably would be Taxotere along with immunotherapy.  I do think that he would benefit from the combination of Taxotere and immunotherapy.  He has not yet had immunotherapy for his malignancy.  He is eating well.  He is not having any problems with nausea or vomiting.  He is still bothered by neuropathy.  He is yet to see neurology but I think he does have an appointment with him.  He has had no change in bowel or bladder habits.  He has had no bleeding.  He has had no rashes.  There is been no leg swelling.  Currently, I would say his performance status is probably ECOG 1.      Medications:  Current Outpatient Medications:    acetaminophen (TYLENOL) 650 MG CR tablet, Take 1,300 mg by mouth 2 (two) times daily., Disp: , Rfl:    alum & mag hydroxide-simeth (MAALOX/MYLANTA) 200-200-20 MG/5ML suspension, Take 30 mLs by mouth daily., Disp: , Rfl:    levothyroxine (SYNTHROID) 50 MCG tablet, Take 50 mcg by mouth daily., Disp: , Rfl:    omeprazole (PRILOSEC) 40 MG capsule, Take 1 capsule (40 mg total) by mouth 2 (two) times daily before a meal. (Patient taking differently: Take 40 mg by mouth 2 (two) times daily as needed.), Disp: 60  capsule, Rfl: 12   polyethylene glycol (MIRALAX / GLYCOLAX) 17 g packet, Take 17 g by mouth daily., Disp: , Rfl:    temazepam (RESTORIL) 7.5 MG capsule, Take 1 capsule (7.5 mg total) by mouth at bedtime as needed for sleep., Disp: 30 capsule, Rfl: 0   traMADol (ULTRAM) 50 MG tablet, Take 50 mg by mouth every 6 (six) hours as needed., Disp: , Rfl:    capecitabine (XELODA) 500 MG tablet, Take 3 tablets (1,500 mg total) by mouth daily. Take within 30 minutes after a meal. Start 2 days BEFORE radiation starts!!! Take on days of radiation thereafter. (Patient not taking: Reported on 01/01/2022), Disp: 90 tablet, Rfl: 0   lisinopril-hydrochlorothiazide (ZESTORETIC) 20-12.5 MG tablet, Take 1 tablet by mouth daily. (Patient not taking: Reported on 02/15/2022), Disp: 90 tablet, Rfl: 3   ondansetron (ZOFRAN) 8 MG tablet, Take 1 tablet (8 mg total) by mouth every 8 (eight) hours as needed for nausea or vomiting. (Patient not taking: Reported on 04/05/2022), Disp: 20 tablet, Rfl: 0   prochlorperazine (COMPAZINE) 10 MG tablet, Take 1 tablet (10 mg total) by mouth every 6 (six) hours as needed (Nausea or vomiting). (Patient not taking: Reported on 03/16/2022), Disp: 30 tablet, Rfl: 1 No current facility-administered medications for this visit.  Facility-Administered Medications Ordered in Other Visits:    heparin lock flush 100 unit/mL,  500 Units, Intravenous, Once, Vedanth Sirico, Rudell Cobb, MD   HYDROcodone-acetaminophen (NORCO/VICODIN) 5-325 MG per tablet 1 tablet, 1 tablet, Oral, Once, Celso Amy, NP   sodium chloride flush (NS) 0.9 % injection 10 mL, 10 mL, Intravenous, Once, Emari Hreha, Rudell Cobb, MD  Allergies:  Allergies  Allergen Reactions   Percocet [Oxycodone-Acetaminophen] Nausea And Vomiting   Oxycodone-Aspirin Nausea And Vomiting    Past Medical History, Surgical history, Social history, and Family History were reviewed and updated.  Review of Systems: Review of Systems  Constitutional:  Positive for  fatigue.  HENT:  Negative.    Eyes: Negative.   Respiratory: Negative.    Cardiovascular: Negative.   Gastrointestinal:  Positive for nausea.  Endocrine: Negative.   Genitourinary: Negative.    Musculoskeletal: Negative.   Skin: Negative.   Neurological: Negative.   Hematological: Negative.   Psychiatric/Behavioral: Negative.      Physical Exam:  height is _0  (1.753 m) and weight is 150 lb (68 kg). His oral temperature is 98.1 F (36.7 C). His blood pressure is 133/61 and his pulse is 73. His respiration is 20 and oxygen saturation is 98%.   Wt Readings from Last 3 Encounters:  04/05/22 150 lb (68 kg)  03/18/22 151 lb 6.4 oz (68.7 kg)  03/16/22 154 lb (69.9 kg)    Physical Exam Vitals reviewed.  HENT:     Head: Normocephalic and atraumatic.  Eyes:     Pupils: Pupils are equal, round, and reactive to light.  Cardiovascular:     Rate and Rhythm: Normal rate and regular rhythm.     Heart sounds: Normal heart sounds.  Pulmonary:     Effort: Pulmonary effort is normal.     Breath sounds: Normal breath sounds.  Abdominal:     General: Bowel sounds are normal.     Palpations: Abdomen is soft.  Musculoskeletal:        General: No tenderness or deformity. Normal range of motion.     Cervical back: Normal range of motion.  Lymphadenopathy:     Cervical: No cervical adenopathy.  Skin:    General: Skin is warm and dry.     Findings: No erythema or rash.  Neurological:     Mental Status: He is alert and oriented to person, place, and time.  Psychiatric:        Behavior: Behavior normal.        Thought Content: Thought content normal.        Judgment: Judgment normal.    Lab Results  Component Value Date   WBC 4.6 04/05/2022   HGB 13.1 04/05/2022   HCT 40.8 04/05/2022   MCV 90.3 04/05/2022   PLT 302 04/05/2022     Chemistry      Component Value Date/Time   NA 138 04/05/2022 1227   NA 139 07/23/2020 0818   K 4.3 04/05/2022 1227   CL 103 04/05/2022 1227   CO2  27 04/05/2022 1227   BUN 20 04/05/2022 1227   BUN 10 07/23/2020 0818   CREATININE 1.16 04/05/2022 1227   CREATININE 1.06 06/27/2015 0837      Component Value Date/Time   CALCIUM 9.7 04/05/2022 1227   ALKPHOS 87 04/05/2022 1227   AST 15 04/05/2022 1227   ALT 14 04/05/2022 1227   BILITOT 0.5 04/05/2022 1227      Impression and Plan: Mr. Haff is a very nice 81 year old white male.  He has what I hope to believe localized gastric cancer.   He  has had 4 cycles of chemotherapy.  By the follow-up scans and endoscopy, he really did not have much of a response.  We subsequently gave him radiation with chemotherapy.  He had low-dose Xeloda.  He has residual disease.  This was biopsy-proven.  I think it be reasonable at this point to follow him along.  Again, he really does not wish to have chemotherapy.  I can understand this.  He really had a tough time with past treatment.  I would like to repeat a PET scan on him in about a month or so.  I think this would be very helpful.  The last PET scan showed that he did have a partial response.  I think if the PET scan does not show anything that is active or progressive, we can try to follow him along and try to get him through the [place] season.  I know that Mr. Payson is doing his best.  Has great support from his family.  His wife is clearly doing all that she can do to try to help him out.  I know he has neuropathy and maybe neurology can help this part.  I know this would help his quality of life.  I would like to see him back probably in about 6 weeks.  We will get the PET scan after Thanksgiving.    Volanda Napoleon, MD 10/30/20232:46 PM

## 2022-04-06 ENCOUNTER — Encounter: Payer: Self-pay | Admitting: *Deleted

## 2022-04-06 DIAGNOSIS — C160 Malignant neoplasm of cardia: Secondary | ICD-10-CM

## 2022-04-06 DIAGNOSIS — C162 Malignant neoplasm of body of stomach: Secondary | ICD-10-CM

## 2022-04-06 NOTE — Progress Notes (Signed)
Patient will need a PET after Thanksgiving. Order placed after confirming with Dr Marin Olp. Scheduled for 05/03/22.  Patient is aware of PET appointment including date, time, and location. The following prep is reviewed with patient and confirmed with teachback: - arrive 30 minutes before appointment time - NPO except water for 6h before scan. No candy, no gum - hold any diabetic medication the morning of the scan - have a low carb dinner the night prior Radiology Information sheet also mailed to patient's home for reinforcement of education.  Oncology Nurse Navigator Documentation     04/06/2022    8:45 AM  Oncology Nurse Navigator Flowsheets  Navigator Follow Up Date: 05/03/2022  Navigator Follow Up Reason: Scan Review  Navigator Location [facility]  Navigator Encounter Type Appt/Treatment Plan Review;MyChart  Patient Visit Type MedOnc  Treatment Phase Active Tx  Barriers/Navigation Needs Coordination of Care  Education Other  Interventions Coordination of Care;Education  Acuity Level 1-No Barriers  Coordination of Care Radiology  Education Method Written  Support Groups/Services Friends and Family  Time Spent with Patient 30

## 2022-04-22 ENCOUNTER — Other Ambulatory Visit: Payer: Self-pay | Admitting: Hematology & Oncology

## 2022-04-22 ENCOUNTER — Ambulatory Visit: Payer: Medicare HMO | Admitting: Neurology

## 2022-04-22 MED ORDER — TEMAZEPAM 7.5 MG PO CAPS: 7.5000 mg | ORAL_CAPSULE | Freq: Every evening | ORAL | 0 refills | Status: DC | PRN

## 2022-05-03 ENCOUNTER — Encounter (HOSPITAL_COMMUNITY)
Admission: RE | Admit: 2022-05-03 | Discharge: 2022-05-03 | Disposition: A | Discharge status: Home / Self Care | Payer: Medicare HMO | Source: Ambulatory Visit | Attending: Hematology & Oncology | Admitting: Hematology & Oncology

## 2022-05-03 DIAGNOSIS — C160 Malignant neoplasm of cardia: Secondary | ICD-10-CM | POA: Diagnosis not present

## 2022-05-03 DIAGNOSIS — C162 Malignant neoplasm of body of stomach: Secondary | ICD-10-CM | POA: Insufficient documentation

## 2022-05-03 DIAGNOSIS — C169 Malignant neoplasm of stomach, unspecified: Secondary | ICD-10-CM | POA: Diagnosis not present

## 2022-05-03 LAB — GLUCOSE, CAPILLARY: Glucose-Capillary: 95 mg/dL (ref 70–99)

## 2022-05-03 MED ORDER — FLUDEOXYGLUCOSE F - 18 (FDG) INJECTION
7.5000 | Freq: Once | INTRAVENOUS | Status: AC | PRN
  Administered 2022-05-03: 7.48 via INTRAVENOUS

## 2022-05-04 ENCOUNTER — Telehealth: Payer: Self-pay

## 2022-05-04 ENCOUNTER — Encounter: Payer: Self-pay | Admitting: *Deleted

## 2022-05-04 NOTE — Telephone Encounter (Signed)
-----   Message from Volanda Napoleon, MD sent at 05/04/2022 11:02 AM EST ----- Call and let him know that the PET scan does not show any increase in the cancer activity.  Everything looks very stable and below.  This is a good sign.  We can hold off on doing any treatment right now.  Thanks.  Laurey Arrow

## 2022-05-04 NOTE — Progress Notes (Signed)
Reviewed PET. No new findings.   Oncology Nurse Navigator Documentation     05/04/2022    9:45 AM  Oncology Nurse Navigator Flowsheets  Navigator Follow Up Date: 05/12/2022  Navigator Follow Up Reason: Follow-up Appointment  Navigator Location [facility]  Navigator Encounter Type Scan Review  Patient Visit Type MedOnc  Treatment Phase Active Tx  Barriers/Navigation Needs Coordination of Care  Interventions None Required  Acuity Level 1-No Barriers  Support Groups/Services Friends and Family  Time Spent with Patient 15

## 2022-05-04 NOTE — Telephone Encounter (Signed)
Called and informed patients daughterSharyn Lull of results. She was very appreciative of results and will go over some questions for dr.ennever at pts follow up no 12/6.

## 2022-05-04 NOTE — Progress Notes (Signed)
Initial neurology clinic note  SERVICE DATE: 05/05/22  Reason for Evaluation: Consultation requested by Volanda Napoleon, MD for an opinion regarding pain in legs and feet. My final recommendations will be communicated back to the requesting physician by way of shared medical record or letter to requesting physician via [place] mail.  HPI: This is Mr. George Dyer, a 81 y.o. right-handed male with a medical history of gastric cancer s/p chemotherapy and radiation, pre-diabetes, hypothyroidism, HTN, lumbar spine disease s/p surgery (?L4-5 fusion) who presents to neurology clinic with the chief complaint of pain in legs and feet. The patient is accompanied by wife.  Patient has had pain in his legs for many years, but it has been very bad over the last year. He describes the pain as shooting pain into his feet. The right side is worse than the left, though he does have similar symptoms in the left leg. He denies tingling or obvious numbness, but his feet are very sensitive to not wanting to be touched. He thinks the symptoms are worse at night. It is also worse with wearing shoes. He feels like his legs get weak when walking. If he sits and rubs his feet, he will feel like things might improve. He denies falls.  Patient denies significant symptoms in his arms. He does have a history of broken left arm (with pins) and arthritis in his hands.  At night he does get the urge to move his legs. He will walk around and his symptoms may improve.   Patient has a history of low back pain and had lumbar fusion (?L4-5) about 10 years ago. Patient does not have clear radiating pain from back, but patient wonders if a nerve is getting pinched in his back.  Patient found out he had gastric cancer in 08/2021. He started chemo in 09/2021. Patient had symptoms prior to chemo, but does think things have gotten much worse since chemo started.  Patient denies problems with chewing or swallowing. He has difficulty  keeping food down sometimes though (?2/2 gastric cancer). He also mentions blurry vision in left eye, but no clear diplopia or ptosis.  Patient was given gabapentin for pain during chemotherapy, but he did not like how he felt, so he stopped it after one dose. They are unsure of any other treatments tried.  Cancer history per Dr. Antonieta Pert clinic note from 03/18/22: Principle Diagnosis:  Stage IIB (T3N0M0) Gastric cancer -- declines surgery   Current Therapy:        FOLFOX - s/p cycle #4-  start on 09/15/2021 XRT/Xeloda -- start on 12/07/2021 --completed on 01/12/2022  EtOH use: None currently, previously would have a cocktail at night  Restrictive diet? No, but does not eat much anymore since gastric cancer Family history of neuropathy/myopathy/NM disease? No  Patient has never had an EMG.   MEDICATIONS:  Outpatient Encounter Medications as of 05/05/2022  Medication Sig   acetaminophen (TYLENOL) 650 MG CR tablet Take 1,300 mg by mouth 2 (two) times daily.   alum & mag hydroxide-simeth (MAALOX/MYLANTA) 200-200-20 MG/5ML suspension Take 30 mLs by mouth daily.   capecitabine (XELODA) 500 MG tablet Take 3 tablets (1,500 mg total) by mouth daily. Take within 30 minutes after a meal. Start 2 days BEFORE radiation starts!!! Take on days of radiation thereafter. (Patient not taking: Reported on 01/01/2022)   levothyroxine (SYNTHROID) 50 MCG tablet Take 50 mcg by mouth daily.   lisinopril-hydrochlorothiazide (ZESTORETIC) 20-12.5 MG tablet Take 1 tablet by mouth daily. (  Patient not taking: Reported on 02/15/2022)   omeprazole (PRILOSEC) 40 MG capsule Take 1 capsule (40 mg total) by mouth 2 (two) times daily before a meal. (Patient taking differently: Take 40 mg by mouth 2 (two) times daily as needed.)   ondansetron (ZOFRAN) 8 MG tablet Take 1 tablet (8 mg total) by mouth every 8 (eight) hours as needed for nausea or vomiting. (Patient not taking: Reported on 04/05/2022)   polyethylene glycol (MIRALAX  / GLYCOLAX) 17 g packet Take 17 g by mouth daily.   prochlorperazine (COMPAZINE) 10 MG tablet Take 1 tablet (10 mg total) by mouth every 6 (six) hours as needed (Nausea or vomiting). (Patient not taking: Reported on 03/16/2022)   temazepam (RESTORIL) 7.5 MG capsule Take 1 capsule (7.5 mg total) by mouth at bedtime as needed for sleep.   traMADol (ULTRAM) 50 MG tablet Take 50 mg by mouth every 6 (six) hours as needed.   Facility-Administered Encounter Medications as of 05/05/2022  Medication   heparin lock flush 100 unit/mL   HYDROcodone-acetaminophen (NORCO/VICODIN) 5-325 MG per tablet 1 tablet   sodium chloride flush (NS) 0.9 % injection 10 mL    PAST MEDICAL HISTORY: Past Medical History:  Diagnosis Date   Anxiety    Arthritis    Constipation    ED (erectile dysfunction)    GERD (gastroesophageal reflux disease)    Goals of care, counseling/discussion 08/18/2021   Headache    History of radiation therapy    Stomach- 12/09/21-01/12/22-Dr. Gery Pray   Hypertension    Kidney stone    Lumbar disc disease    Pinched nerve    Pneumonia    Primary cancer of cardia of stomach ([place]) 08/18/2021   SVC syndrome    SECONDARY TO CHRONIC FIBROSING MEDIASTINITIS   Thyroid disease     PAST SURGICAL HISTORY: Past Surgical History:  Procedure Laterality Date   BIOPSY  08/13/2021   Procedure: BIOPSY;  Surgeon: Irving Copas., MD;  Location: [facility] ENDOSCOPY;  Service: Gastroenterology;;   BREAST BIOPSY     RIGHT BREAST   CARDIAC SURGERY  1970's   wife wasn't sure what type of heart surgery.   COLONOSCOPY  08/06/2021   [name]   ESOPHAGOGASTRODUODENOSCOPY (EGD) WITH PROPOFOL N/A 08/13/2021   Procedure: ESOPHAGOGASTRODUODENOSCOPY (EGD) WITH PROPOFOL;  Surgeon: Irving Copas., MD;  Location: [place];  Service: Gastroenterology;  Laterality: N/A;   EUS N/A 08/13/2021   Procedure: UPPER ENDOSCOPIC ULTRASOUND (EUS) RADIAL;  Surgeon: Irving Copas., MD;  Location:  [place];  Service: Gastroenterology;  Laterality: N/A;   INGUINAL HERNIA REPAIR  07/10/2012   Procedure: LAPAROSCOPIC INGUINAL HERNIA;  Surgeon: Ralene Ok, MD;  Location: [place];  Service: General;  Laterality: Right;   INGUINAL HERNIA REPAIR  07/10/2012   [facility]   INSERTION OF MESH  07/10/2012   Procedure: INSERTION OF MESH;  Surgeon: Ralene Ok, MD;  Location: [place];  Service: General;  Laterality: Right;   IR PATIENT EVAL TECH 0-60 MINS  11/26/2021   LUMBAR DISC SURGERY     ORIF HUMERUS FRACTURE Left 01/13/2016   Procedure: OPEN REDUCTION INTERNAL FIXATION (ORIF) LEFT PROXIMAL HUMERUS FRACTURE;  Surgeon: Roseanne Kaufman, MD;  Location: [place];  Service: Orthopedics;  Laterality: Left;   TONSILLECTOMY     US ECHOCARDIOGRAPHY  04/20/1993   EF 65%    ALLERGIES: Allergies  Allergen Reactions   Percocet [Oxycodone-Acetaminophen] Nausea And Vomiting   Oxycodone-Aspirin Nausea And Vomiting    FAMILY HISTORY: Family History  Problem Relation Age of Onset   Hypertension Mother    Heart attack Father    Heart disease Father    Cancer Maternal Aunt        breast   Esophageal cancer Maternal Uncle    Cancer Maternal Uncle    Colon cancer Neg Hx    Stomach cancer Neg Hx    Rectal cancer Neg Hx     SOCIAL HISTORY: Social History   Tobacco Use   Smoking status: Former    Packs/day: 0.25    Years: 6.00    Total pack years: 1.50    Types: Cigarettes    Quit date: 03/25/1981    Years since quitting: 41.1   Smokeless tobacco: Never  Vaping Use   Vaping Use: Never used  Substance Use Topics   Alcohol use: No   Drug use: No   Social History   Social History Narrative   Are you right handed or left handed? right   Are you currently employed ? retired   What is your current occupation?   Do you live at home alone? With wife   Caffeine 1-2 cups daily   What type of home do you live in: 1 story or 2 story? one         OBJECTIVE: PHYSICAL EXAM: BP (!) 140/69    Pulse 76   Ht '5\' 9"'$  (1.753 m)   Wt 151 lb 6.4 oz (68.7 kg)   SpO2 97%   BMI 22.36 kg/m   General: General appearance: Awake and alert. No distress. Cooperative with exam.  Skin: No obvious rash or jaundice. HEENT: Atraumatic. Anicteric. Lungs: Non-labored breathing on room air  Extremities: No edema. No obvious deformity.  Musculoskeletal: No obvious joint swelling. Psych: Affect appropriate.  Neurological: Mental Status: Alert. Speech fluent. No pseudobulbar affect Cranial Nerves: CNII: No RAPD. Visual fields grossly intact. CNIII, IV, VI: PERRL. No nystagmus. Restricted up gaze, otherwise, EOMI CN V: Facial sensation intact bilaterally to fine touch. [place] clench strong. CN VII: Facial muscles symmetric and strong. No ptosis at rest. CN VIII: Hearing grossly intact bilaterally. CN IX: No hypophonia. CN X: Palate elevates symmetrically. CN XI: Full strength shoulder shrug bilaterally. CN XII: Tongue protrusion full and midline. No atrophy or fasciculations. No significant dysarthria Motor: Tone is normal.  Individual muscle group testing (MRC grade out of 5):  Movement     Neck flexion 5    Neck extension 5     Right Left   Shoulder abduction 5 5   Shoulder adduction 5 5   Shoulder ext rotation 5 5   Shoulder int rotation 5 5   Elbow flexion 5 5   Elbow extension 5 5   Wrist extension 5 5   Wrist flexion 5 5   Finger abduction - FDI 5 5   Finger abduction - ADM 5 5   Finger extension 5 5   Finger distal flexion - 2/'3 5 5   '$ Finger distal flexion - 4/'5 5 5   '$ Thumb flexion - FPL 5 5   Thumb abduction - APB 5 5    Hip flexion 5 5   Hip extension 5 5   Hip adduction 5 5   Hip abduction 5 5   Knee extension 5 5   Knee flexion 5 5   Dorsiflexion 5 5   Plantarflexion 5 5   Inversion 5 5   Eversion 5 5   Great toe extension 5 5   Great  toe flexion 5 5     Reflexes:  Right Left   Bicep 2+ 2+   Tricep 2+ 2+   BrRad 2+ 2+   Knee 2+ 2+   Ankle 0 0     Pathological Reflexes: Babinski: flexor response bilaterally Hoffman: absent bilaterally Troemner: absent bilaterally Sensation: Pinprick: Intact in all extremities Vibration: Intact in bilateral upper extremities. 1-2 seconds in bilateral great toes, stronger at ankles, full at knees Proprioception: Absent in right great toe, intact in left great toe Coordination: Intact finger-to- nose-finger bilaterally. Romberg negative. Gait: Able to rise from chair with arms crossed unassisted. Normal, narrow-based gait. Able to tandem walk. Able to walk on toes and heels.  Lab and Test Review: Internal labs: Normal or unremarkable: CBC, iron, TIBC, sat ratio, TSH CMP (04/05/22) significant for mildly elevated glucose Ferritin (03/18/22): 67  External labs: B12 (10/08/21): 710 HbA1c (08/24/21): 6.0  CT lumbar spine w contrast (10/15/2005): IMPRESSION:  Status post fusion L4-5 without abnormal motion at this level between flexion and extension.  CT LUMBAR SPINE WITH CONTRAST (POST-MYELOGRAM):  Technique:  Multidetector CT imaging of the lumbar spine was performed after intrathecal injection of contrast.  Multiplanar CT image reconstructions were also generated.  Findings:  Images were obtained from the T11-12 through the mid to lower sacral region.   Conus just below the L1-2 disc space.  No surrounding abnormal vasculature.  Of note, is a nodular filling defect within the right aspect of the thecal sac intimately related to right sided nerve roots measuring 5.2 x 4.2 mm just below the L2-3 disc space (series 7 image 62).  This may represent a nerve sheath tumor although other causes for such, such as drop metastases or focal arachnoiditis cannot be excluded.  Patient would benefit from MR of the lumbar spine with and without contrast with attention to this region.   T11-12:  Slightly narrowed spinal canal secondary to short pedicles.   T12-L1:  Negative.  L1-2:  Negative.  L2-3:  Mild bulge.    L3-4:  Mild facet joint degenerative changes.  Mild bulge.  Mild spinal stenosis.   L4-5:  Status post fusion.  Lower pedicle screws traverse through the anterior margin of the L5 cortex approaching the posterior aspect or the common iliac vessels.  Interbody fusion device in place with anterior erosion and osteophyte formation.  This is probably degenerative in origin and infection would only need to be considered if high clinical concern.  Mild bilateral foraminal narrowing without nerve root compression.  No significant spinal stenosis.   L5-S1:  Mild to moderate bulge with mild bilateral facet degenerative changes.  IMPRESSION:  Postop changes L4-5 level.  No significant disc herniation or nerve root compression.  Filling defect within the right aspect of the thecal sac just below the L2-3 level.  Etiology indeterminate.  MR imaging with contrast and attention to this region recommended.  Please see above discussion.   CT cervical spine wo contrast (01/24/2007): Impression:  1. Negative for fracture or other acute bone injury.  2. Multilevel   cervical spondylitic changes.   ASSESSMENT: George Dyer is a 81 y.o. male who presents for evaluation of leg and foot pain. He has a relevant medical history of gastric cancer s/p chemotherapy and radiation, pre-diabetes, hypothyroidism, HTN, lumbar spine disease s/p surgery (?L4-5 fusion). His neurological examination is pertinent for diminished sensation in bilateral lower extremities with some asymmetry to proprioception (right absent, left intact). Available diagnostic data is significant for normal B12  and HbA1c in pre-diabetic range (6.0). His iron studies were normal, but with a ferritin of 67.  Patient's symptoms are likely multifactorial with contributions from lumbar spine disease (radiculopathy), neuropathy (risk factors include pre-diabetes and chemotherapy with oxaliplatin and capecitabine), and perhaps restless leg syndrome (could be  secondary to neuropathy). Patient likely had baseline symptoms that worsened after chemotherapy. The asymmetry seen may be due to spine disease.  PLAN: -Blood work: IFE, B1 -Discussed EMG, but deferred at this time as he has complicated history that would make results difficult to interpret and likely not change management -MRI lumbar spine wo contrast -Cymbalta: 30 mg daily for 1 week, then increase to 60 mg daily -Lidocaine cream PRN over the counter -Pt has low ferritin (<75) that may be causing RLS symptoms.   -we will add ferrous sulfate 325 mg daily x 3 months.  Discussed not to take with milk.  Discussed that this can cause constipation and to drink plenty of water.   -take vitamin C, 100-200 mg with each ferrous sulfate dose, or take the ferrous sulfate with small glass of orange juice (if not diabetic)  -will recheck ferritin in the future      -Return to clinic in 3 months  The impression above as well as the plan as outlined below were extensively discussed with the patient (in the company of wife) who voiced understanding. All questions were answered to their satisfaction.  The patient was counseled on pertinent fall precautions per the printed material provided today, and as noted under the "Patient Instructions" section below.  When available, results of the above investigations and possible further recommendations will be communicated to the patient via telephone/MyChart. Patient to call office if not contacted after expected testing turnaround time.   Total time spent reviewing records, interview, history/exam, documentation, and coordination of care on day of encounter:  80 min   Thank you for allowing me to participate in patient's care.  If I can answer any additional questions, I would be pleased to do so.  Kai Levins, MD   CC: Gweneth Fritter, [place] [id number]  CC: [place]: [place] [address] [place],  [place] [address]

## 2022-05-05 ENCOUNTER — Encounter: Payer: Self-pay | Admitting: Neurology

## 2022-05-05 ENCOUNTER — Other Ambulatory Visit (INDEPENDENT_AMBULATORY_CARE_PROVIDER_SITE_OTHER): Payer: Medicare HMO

## 2022-05-05 ENCOUNTER — Ambulatory Visit (INDEPENDENT_AMBULATORY_CARE_PROVIDER_SITE_OTHER): Payer: Medicare HMO | Admitting: Neurology

## 2022-05-05 VITALS — BP 140/69 | HR 76 | Ht 69.0 in | Wt 151.4 lb

## 2022-05-05 DIAGNOSIS — M79604 Pain in right leg: Secondary | ICD-10-CM

## 2022-05-05 DIAGNOSIS — G620 Drug-induced polyneuropathy: Secondary | ICD-10-CM

## 2022-05-05 DIAGNOSIS — M545 Low back pain, unspecified: Secondary | ICD-10-CM

## 2022-05-05 DIAGNOSIS — T451X5A Adverse effect of antineoplastic and immunosuppressive drugs, initial encounter: Secondary | ICD-10-CM

## 2022-05-05 DIAGNOSIS — G8929 Other chronic pain: Secondary | ICD-10-CM

## 2022-05-05 DIAGNOSIS — M5417 Radiculopathy, lumbosacral region: Secondary | ICD-10-CM

## 2022-05-05 DIAGNOSIS — M79605 Pain in left leg: Secondary | ICD-10-CM

## 2022-05-05 MED ORDER — DULOXETINE HCL 30 MG PO CPEP: 60.0000 mg | ORAL_CAPSULE | Freq: Every day | ORAL | 3 refills | Status: DC

## 2022-05-05 NOTE — Patient Instructions (Signed)
I saw you today for leg and foot pain. Your symptoms are likely a combination of pinched nerves in back, neuropathy (with contributions from your chemotherapy), and perhaps restless leg syndrome.   I would like to get further information to look for other causes.  We will get blood work today.  I am ordering an MRI of your lumbar spine to evaluate for pinched nerves in your back.  We discussed a muscle and nerve test called EMG, but given you have known back problems and neuropathy, it would likely not be too helpful at this time. We will consider it in the future if needed.  To treat your symptoms, I am prescribing Cymbalta for the pain. You will take 1 capsule (30 mg) for the 1st week, then increase to 2 capsules (60 mg) after that. Please let me know if you have problems with this medication.  I also recommend over the counter lidocaine cream. You rub it in where it hurts as needed. Wear gloves so your hands won't be numb.  Your iron studies indicated that you may need some iron supplements to prevent restless leg syndrome.   -we will add ferrous sulfate 325 mg daily x 3 months.   -Discussed not to take with milk.   -Discussed that this can cause constipation and to drink plenty of water.   -take vitamin C, 100-200 mg with each ferrous sulfate dose, or take the ferrous sulfate with small glass of orange juice (if not diabetic)  I will be in touch when I have the results of your blood work and MRI. Please let me know if you have any questions or concerns in the meantime.   The physicians and staff at [facility] are committed to providing excellent care. You may receive a survey requesting feedback about your experience at our office. We strive to receive "very good" responses to the survey questions. If you feel that your experience would prevent you from giving the office a "very good " response, please contact our office to try to remedy the situation. We may be reached at  [phone]. Thank you for taking the time out of your busy day to complete the survey.  Kai Levins, MD [facility]  Preventing Falls at [facility] are common, often dreaded events in the lives of older people. Aside from the obvious injuries and even death that may result, fall can cause wide-ranging consequences including loss of independence, mental decline, decreased activity and mobility. Younger people are also at risk of falling, especially those with chronic illnesses and fatigue.  Ways to reduce risk for falling Examine diet and medications. Warm foods and alcohol dilate blood vessels, which can lead to dizziness when standing. Sleep aids, antidepressants and pain medications can also increase the likelihood of a fall.  Get a vision exam. Poor vision, cataracts and glaucoma increase the chances of falling.  Check foot gear. Shoes should fit snugly and have a sturdy, nonskid sole and a broad, low heel  Participate in a physician-approved exercise program to build and maintain muscle strength and improve balance and coordination. Programs that use ankle weights or stretch bands are excellent for muscle-strengthening. Water aerobics programs and low-impact Tai Chi programs have also been shown to improve balance and coordination.  Increase vitamin D intake. Vitamin D improves muscle strength and increases the amount of calcium the body is able to absorb and deposit in bones.  How to prevent falls from common hazards Floors - Remove all loose wires,  cords, and throw rugs. Minimize clutter. Make sure rugs are anchored and smooth. Keep furniture in its usual place.  Chairs -- Use chairs with straight backs, armrests and firm seats. Add firm cushions to existing pieces to add height.  Bathroom - Install grab bars and non-skid tape in the tub or shower. Use a bathtub transfer bench or a shower chair with a back support Use an elevated toilet seat and/or safety rails to assist  standing from a low surface. Do not use towel racks or bathroom tissue holders to help you stand.  Lighting - Make sure halls, stairways, and entrances are well-lit. Install a night light in your bathroom or hallway. Make sure there is a light switch at the top and bottom of the staircase. Turn lights on if you get up in the middle of the night. Make sure lamps or light switches are within reach of the bed if you have to get up during the night.  Kitchen - Install non-skid rubber mats near the sink and stove. Clean spills immediately. Store frequently used utensils, pots, pans between waist and eye level. This helps prevent reaching and bending. Sit when getting things out of lower cupboards.  Living room/ Bedrooms - Place furniture with wide spaces in between, giving enough room to move around. Establish a route through the living room that gives you something to hold onto as you walk.  Stairs - Make sure treads, rails, and rugs are secure. Install a rail on both sides of the stairs. If stairs are a threat, it might be helpful to arrange most of your activities on the lower level to reduce the number of times you must climb the stairs.  Entrances and doorways - Install metal handles on the walls adjacent to the doorknobs of all doors to make it more secure as you travel through the doorway.  Tips for maintaining balance Keep at least one hand free at all times. Try using a backpack or fanny pack to hold things rather than carrying them in your hands. Never carry objects in both hands when walking as this interferes with keeping your balance.  Attempt to swing both arms from front to back while walking. This might require a conscious effort if Parkinson's disease has diminished your movement. It will, however, help you to maintain balance and posture, and reduce fatigue.  Consciously lift your feet off of the ground when walking. Shuffling and dragging of the feet is a common culprit in losing your  balance.  When trying to navigate turns, use a "U" technique of facing forward and making a wide turn, rather than pivoting sharply.  Try to stand with your feet shoulder-length apart. When your feet are close together for any length of time, you increase your risk of losing your balance and falling.  Do one thing at a time. Don't try to walk and accomplish another task, such as reading or looking around. The decrease in your automatic reflexes complicates motor function, so the less distraction, the better.  Do not wear rubber or gripping soled shoes, they might "catch" on the floor and cause tripping.  Move slowly when changing positions. Use deliberate, concentrated movements and, if needed, use a grab bar or walking aid. Count 15 seconds between each movement. For example, when rising from a seated position, wait 15 seconds after standing to begin walking.  If balance is a continuous problem, you might want to consider a walking aid such as a cane, walking stick, or walker.  Once you've mastered walking with help, you might be ready to try it on your own again.

## 2022-05-11 LAB — VITAMIN B1: Vitamin B1 (Thiamine): 21 nmol/L (ref 8–30)

## 2022-05-11 LAB — IMMUNOFIXATION ELECTROPHORESIS
IgG (Immunoglobin G), Serum: 931 mg/dL (ref 600–1540)
IgM, Serum: 59 mg/dL (ref 50–300)
Immunoglobulin A: 278 mg/dL (ref 70–320)

## 2022-05-12 ENCOUNTER — Inpatient Hospital Stay: Payer: Medicare HMO | Attending: Hematology & Oncology

## 2022-05-12 ENCOUNTER — Inpatient Hospital Stay (HOSPITAL_BASED_OUTPATIENT_CLINIC_OR_DEPARTMENT_OTHER): Payer: Medicare HMO | Admitting: Hematology & Oncology

## 2022-05-12 ENCOUNTER — Encounter: Payer: Self-pay | Admitting: Hematology & Oncology

## 2022-05-12 ENCOUNTER — Telehealth: Payer: Self-pay | Admitting: Neurology

## 2022-05-12 ENCOUNTER — Encounter: Payer: Self-pay | Admitting: *Deleted

## 2022-05-12 VITALS — BP 129/70 | HR 82 | Temp 98.1°F | Resp 17 | Wt 151.0 lb

## 2022-05-12 DIAGNOSIS — G629 Polyneuropathy, unspecified: Secondary | ICD-10-CM | POA: Diagnosis not present

## 2022-05-12 DIAGNOSIS — C160 Malignant neoplasm of cardia: Secondary | ICD-10-CM

## 2022-05-12 DIAGNOSIS — C162 Malignant neoplasm of body of stomach: Secondary | ICD-10-CM | POA: Insufficient documentation

## 2022-05-12 DIAGNOSIS — K5900 Constipation, unspecified: Secondary | ICD-10-CM | POA: Insufficient documentation

## 2022-05-12 DIAGNOSIS — R1310 Dysphagia, unspecified: Secondary | ICD-10-CM | POA: Diagnosis not present

## 2022-05-12 DIAGNOSIS — Z79899 Other long term (current) drug therapy: Secondary | ICD-10-CM | POA: Diagnosis not present

## 2022-05-12 LAB — CBC WITH DIFFERENTIAL (CANCER CENTER ONLY)
Abs Immature Granulocytes: 0.03 10*3/uL (ref 0.00–0.07)
Basophils Absolute: 0.1 10*3/uL (ref 0.0–0.1)
Basophils Relative: 1 %
Eosinophils Absolute: 0.2 10*3/uL (ref 0.0–0.5)
Eosinophils Relative: 2 %
HCT: 42 % (ref 39.0–52.0)
Hemoglobin: 13.3 g/dL (ref 13.0–17.0)
Immature Granulocytes: 0 %
Lymphocytes Relative: 14 %
Lymphs Abs: 1.1 10*3/uL (ref 0.7–4.0)
MCH: 28.5 pg (ref 26.0–34.0)
MCHC: 31.7 g/dL (ref 30.0–36.0)
MCV: 90.1 fL (ref 80.0–100.0)
Monocytes Absolute: 0.9 10*3/uL (ref 0.1–1.0)
Monocytes Relative: 12 %
Neutro Abs: 5.3 10*3/uL (ref 1.7–7.7)
Neutrophils Relative %: 71 %
Platelet Count: 294 10*3/uL (ref 150–400)
RBC: 4.66 MIL/uL (ref 4.22–5.81)
RDW: 13.5 % (ref 11.5–15.5)
WBC Count: 7.5 10*3/uL (ref 4.0–10.5)
nRBC: 0 % (ref 0.0–0.2)

## 2022-05-12 LAB — CMP (CANCER CENTER ONLY)
ALT: 12 U/L (ref 0–44)
AST: 12 U/L — ABNORMAL LOW (ref 15–41)
Albumin: 4.2 g/dL (ref 3.5–5.0)
Alkaline Phosphatase: 93 U/L (ref 38–126)
Anion gap: 7 (ref 5–15)
BUN: 19 mg/dL (ref 8–23)
CO2: 30 mmol/L (ref 22–32)
Calcium: 9.5 mg/dL (ref 8.9–10.3)
Chloride: 101 mmol/L (ref 98–111)
Creatinine: 1.04 mg/dL (ref 0.61–1.24)
GFR, Estimated: >60 mL/min (ref >60)
Glucose, Bld: 121 mg/dL — ABNORMAL HIGH (ref 70–99)
Potassium: 4.4 mmol/L (ref 3.5–5.1)
Sodium: 138 mmol/L (ref 135–145)
Total Bilirubin: 0.6 mg/dL (ref 0.3–1.2)
Total Protein: 7 g/dL (ref 6.5–8.1)

## 2022-05-12 LAB — LACTATE DEHYDROGENASE: LDH: 90 U/L — ABNORMAL LOW (ref 98–192)

## 2022-05-12 NOTE — Progress Notes (Signed)
Hematology and Oncology Follow Up Visit  George Dyer [id number] Nov 12, 1940 81 y.o. 05/12/2022   Principle Diagnosis:  Stage IIB (T3N0M0) Gastric cancer -- declines surgery  Current Therapy:   FOLFOX - s/p cycle #4-  start on 09/15/2021 XRT/Xeloda -- start on 12/07/2021 --completed on 01/12/2022     Interim History:  Mr. Needs is back for follow-up.  He continues to look quite good.  He feels good.  Had a nice Thanksgiving with his family.  We did go ahead and do a PET scan on him.  The PET scan was done on 05/03/2022.  Thankfully, the PET scan did not show any new areas of disease.  He still had some mild uptake in the chest which she has always had.  As such, we will continue to watch him and hold off on any type of therapy.  On occasion, he does have little bit of dysphagia.  However, nothing gets stuck.  He does not have any nausea or vomiting.  He is constipated..  Did see neurology for his legs.  He has a bad neuropathy.  I think they put him on Cymbalta.  However, after 1 pill, he stopped taking it because of side effects.  He has had no bleeding.  There is been no fever.  He has had no cough or shortness of breath.  He is still trying to stay active around his house.  Overall, I would have to say that his performance status is ECOG 1.    Medications:  Current Outpatient Medications:    acetaminophen (TYLENOL) 650 MG CR tablet, Take 1,300 mg by mouth 2 (two) times daily. prn, Disp: , Rfl:    alum & mag hydroxide-simeth (MAALOX/MYLANTA) 165-537-48 MG/5ML suspension, Take 30 mLs by mouth daily., Disp: , Rfl:    DULoxetine (CYMBALTA) 30 MG capsule, Take 2 capsules (60 mg total) by mouth at bedtime. Take 1 capsule (30 mg) for the 1st week, then increase to 2 capsules thereafter., Disp: 60 capsule, Rfl: 3   levothyroxine (SYNTHROID) 50 MCG tablet, Take 50 mcg by mouth daily., Disp: , Rfl:    lisinopril-hydrochlorothiazide (ZESTORETIC) 20-12.5 MG tablet, Take 1 tablet by  mouth daily., Disp: 90 tablet, Rfl: 3   omeprazole (PRILOSEC) 40 MG capsule, Take 1 capsule (40 mg total) by mouth 2 (two) times daily before a meal. (Patient taking differently: Take 40 mg by mouth 2 (two) times daily as needed.), Disp: 60 capsule, Rfl: 12   ondansetron (ZOFRAN) 8 MG tablet, Take 1 tablet (8 mg total) by mouth every 8 (eight) hours as needed for nausea or vomiting., Disp: 20 tablet, Rfl: 0   polyethylene glycol (MIRALAX / GLYCOLAX) 17 g packet, Take 17 g by mouth daily., Disp: , Rfl:    prochlorperazine (COMPAZINE) 10 MG tablet, Take 1 tablet (10 mg total) by mouth every 6 (six) hours as needed (Nausea or vomiting)., Disp: 30 tablet, Rfl: 1   temazepam (RESTORIL) 7.5 MG capsule, Take 1 capsule (7.5 mg total) by mouth at bedtime as needed for sleep., Disp: 30 capsule, Rfl: 0   traMADol (ULTRAM) 50 MG tablet, Take 50 mg by mouth every 6 (six) hours as needed., Disp: , Rfl:    capecitabine (XELODA) 500 MG tablet, Take 3 tablets (1,500 mg total) by mouth daily. Take within 30 minutes after a meal. Start 2 days BEFORE radiation starts!!! Take on days of radiation thereafter. (Patient not taking: Reported on 01/01/2022), Disp: 90 tablet, Rfl: 0 No current facility-administered medications for this  visit.  Facility-Administered Medications Ordered in Other Visits:    heparin lock flush 100 unit/mL, 500 Units, Intravenous, Once, Charese Abundis, Rudell Cobb, MD   HYDROcodone-acetaminophen (NORCO/VICODIN) 5-325 MG per tablet 1 tablet, 1 tablet, Oral, Once, Celso Amy, NP   sodium chloride flush (NS) 0.9 % injection 10 mL, 10 mL, Intravenous, Once, Everlee Quakenbush, Rudell Cobb, MD  Allergies:  Allergies  Allergen Reactions   Percocet [Oxycodone-Acetaminophen] Nausea And Vomiting   Oxycodone-Aspirin Nausea And Vomiting    Past Medical History, Surgical history, Social history, and Family History were reviewed and updated.  Review of Systems: Review of Systems  Constitutional:  Positive for fatigue.   HENT:  Negative.    Eyes: Negative.   Respiratory: Negative.    Cardiovascular: Negative.   Gastrointestinal:  Positive for nausea.  Endocrine: Negative.   Genitourinary: Negative.    Musculoskeletal: Negative.   Skin: Negative.   Neurological: Negative.   Hematological: Negative.   Psychiatric/Behavioral: Negative.      Physical Exam:  weight is 151 lb (68.5 kg). His oral temperature is 98.1 F (36.7 C). His blood pressure is 129/70 and his pulse is 82. His respiration is 17 and oxygen saturation is 98%.   Wt Readings from Last 3 Encounters:  05/12/22 151 lb (68.5 kg)  05/05/22 151 lb 6.4 oz (68.7 kg)  04/05/22 150 lb (68 kg)    Physical Exam Vitals reviewed.  HENT:     Head: Normocephalic and atraumatic.  Eyes:     Pupils: Pupils are equal, round, and reactive to light.  Cardiovascular:     Rate and Rhythm: Normal rate and regular rhythm.     Heart sounds: Normal heart sounds.  Pulmonary:     Effort: Pulmonary effort is normal.     Breath sounds: Normal breath sounds.  Abdominal:     General: Bowel sounds are normal.     Palpations: Abdomen is soft.  Musculoskeletal:        General: No tenderness or deformity. Normal range of motion.     Cervical back: Normal range of motion.  Lymphadenopathy:     Cervical: No cervical adenopathy.  Skin:    General: Skin is warm and dry.     Findings: No erythema or rash.  Neurological:     Mental Status: He is alert and oriented to person, place, and time.  Psychiatric:        Behavior: Behavior normal.        Thought Content: Thought content normal.        Judgment: Judgment normal.    Lab Results  Component Value Date   WBC 7.5 05/12/2022   HGB 13.3 05/12/2022   HCT 42.0 05/12/2022   MCV 90.1 05/12/2022   PLT 294 05/12/2022     Chemistry      Component Value Date/Time   NA 138 05/12/2022 0930   NA 139 07/23/2020 0818   K 4.4 05/12/2022 0930   CL 101 05/12/2022 0930   CO2 30 05/12/2022 0930   BUN 19  05/12/2022 0930   BUN 10 07/23/2020 0818   CREATININE 1.04 05/12/2022 0930   CREATININE 1.06 06/27/2015 0837      Component Value Date/Time   CALCIUM 9.5 05/12/2022 0930   ALKPHOS 93 05/12/2022 0930   AST 12 (L) 05/12/2022 0930   ALT 12 05/12/2022 0930   BILITOT 0.6 05/12/2022 0930      Impression and Plan: Mr. Groene is a very nice 81 year old white male.  He has what  I hope to believe localized gastric cancer.   He has had 4 cycles of chemotherapy.  By the follow-up scans and endoscopy, he really did not have much of a response.  We subsequently gave him radiation with chemotherapy.  He had low-dose Xeloda.  He has residual disease.  This was biopsy-proven.  I think it be reasonable at this point to follow him along.  Again, he really does not wish to have chemotherapy.  I can understand this.  He really had a tough time with past treatment.  I will plan for another PET scan on him probably in February.  Will try to get him through the holidays and get him through some of Winter.  If he has any problems, he was knows he can see [place].  I just want his quality of life to be good.  He really wants to have a good quality of life.  As long as the PET scan does not show any evidence of disease activity, we can watch.   Volanda Napoleon, MD 12/6/202310:46 AM

## 2022-05-12 NOTE — Telephone Encounter (Signed)
Pt wife states that the medication  Duloxetine made patient loopy and sick and he Into able to take it   Also she states that they have not heard from the MRI place please call

## 2022-05-12 NOTE — Progress Notes (Signed)
Patient has residual cancer after completing chemotherapy, followed by chemoRT. At this time patient doesn't wish for any further treatment and would like to just be followed by observation at this time.   At this time will discontinue active navigation, but be available to the patient as needed in the future.  Oncology Nurse Navigator Documentation     05/12/2022   10:00 AM  Oncology Nurse Navigator Flowsheets  Navigation Complete Date: 05/12/2022  Post Navigation: Continue to Follow Patient? No  Reason Not Navigating Patient: No Treatment, Observation Only  Navigator Location [facility]  Navigator Encounter Type Follow-up Appt;Appt/Treatment Plan Review  Patient Visit Type MedOnc  Treatment Phase Post-Tx Follow-up  Barriers/Navigation Needs No Barriers At This Time  Interventions None Required  Acuity Level 1-No Barriers  Support Groups/Services Friends and Family  Time Spent with Patient 15

## 2022-05-13 NOTE — Telephone Encounter (Signed)
[facility] and they are calling patient right now for scheduling

## 2022-05-19 ENCOUNTER — Other Ambulatory Visit: Payer: Self-pay | Admitting: Hematology & Oncology

## 2022-05-19 DIAGNOSIS — C160 Malignant neoplasm of cardia: Secondary | ICD-10-CM

## 2022-05-20 ENCOUNTER — Other Ambulatory Visit: Payer: Self-pay

## 2022-05-20 ENCOUNTER — Encounter: Payer: Self-pay | Admitting: Neurology

## 2022-05-20 ENCOUNTER — Encounter: Payer: Self-pay | Admitting: *Deleted

## 2022-05-20 MED ORDER — TEMAZEPAM 7.5 MG PO CAPS: 7.5000 mg | ORAL_CAPSULE | Freq: Every evening | ORAL | 0 refills | Status: DC | PRN

## 2022-05-20 NOTE — Telephone Encounter (Signed)
Last refilled 04/22/22 #30. Please advise thank you.

## 2022-05-21 ENCOUNTER — Telehealth: Payer: Self-pay

## 2022-05-21 DIAGNOSIS — G620 Drug-induced polyneuropathy: Secondary | ICD-10-CM

## 2022-05-21 DIAGNOSIS — G8929 Other chronic pain: Secondary | ICD-10-CM

## 2022-05-21 DIAGNOSIS — M5417 Radiculopathy, lumbosacral region: Secondary | ICD-10-CM

## 2022-05-21 DIAGNOSIS — M79605 Pain in left leg: Secondary | ICD-10-CM

## 2022-05-21 DIAGNOSIS — M79604 Pain in right leg: Secondary | ICD-10-CM

## 2022-05-21 DIAGNOSIS — T451X5A Adverse effect of antineoplastic and immunosuppressive drugs, initial encounter: Secondary | ICD-10-CM

## 2022-05-21 NOTE — Telephone Encounter (Signed)
-----   Message from Shellia Carwin, MD sent at 05/21/2022 10:32 AM EST ----- Regarding: Pain management referral Can you put in a pain management referral for patient? It is for pain in bilateral legs.  Thank you,  Kai Levins, MD

## 2022-05-21 NOTE — Telephone Encounter (Signed)
Referral for Pain Management sent.

## 2022-05-28 ENCOUNTER — Ambulatory Visit: Payer: Medicare HMO | Admitting: Neurology

## 2022-06-09 ENCOUNTER — Encounter: Payer: Self-pay | Admitting: Physical Medicine and Rehabilitation

## 2022-06-11 ENCOUNTER — Ambulatory Visit
Admission: RE | Admit: 2022-06-11 | Discharge: 2022-06-11 | Disposition: A | Discharge status: Home / Self Care | Payer: Medicare HMO | Source: Ambulatory Visit | Attending: Neurology | Admitting: Neurology

## 2022-06-11 DIAGNOSIS — M47816 Spondylosis without myelopathy or radiculopathy, lumbar region: Secondary | ICD-10-CM | POA: Diagnosis not present

## 2022-06-11 DIAGNOSIS — M79604 Pain in right leg: Secondary | ICD-10-CM

## 2022-06-11 DIAGNOSIS — T451X5A Adverse effect of antineoplastic and immunosuppressive drugs, initial encounter: Secondary | ICD-10-CM

## 2022-06-11 DIAGNOSIS — M545 Low back pain, unspecified: Secondary | ICD-10-CM

## 2022-06-11 DIAGNOSIS — M5417 Radiculopathy, lumbosacral region: Secondary | ICD-10-CM

## 2022-06-18 ENCOUNTER — Other Ambulatory Visit: Payer: Self-pay | Admitting: Hematology & Oncology

## 2022-06-18 ENCOUNTER — Encounter: Payer: Self-pay | Admitting: *Deleted

## 2022-06-18 DIAGNOSIS — C160 Malignant neoplasm of cardia: Secondary | ICD-10-CM

## 2022-06-21 ENCOUNTER — Other Ambulatory Visit: Payer: Self-pay

## 2022-06-21 DIAGNOSIS — C160 Malignant neoplasm of cardia: Secondary | ICD-10-CM

## 2022-06-21 MED ORDER — TEMAZEPAM 7.5 MG PO CAPS: 7.5000 mg | ORAL_CAPSULE | Freq: Every evening | ORAL | 0 refills | Status: DC | PRN

## 2022-07-06 ENCOUNTER — Encounter: Payer: Self-pay | Admitting: Hematology & Oncology

## 2022-07-06 ENCOUNTER — Other Ambulatory Visit: Payer: Self-pay | Admitting: Hematology & Oncology

## 2022-07-06 DIAGNOSIS — G620 Drug-induced polyneuropathy: Secondary | ICD-10-CM | POA: Insufficient documentation

## 2022-07-06 DIAGNOSIS — C169 Malignant neoplasm of stomach, unspecified: Secondary | ICD-10-CM | POA: Diagnosis not present

## 2022-07-06 DIAGNOSIS — R5383 Other fatigue: Secondary | ICD-10-CM | POA: Insufficient documentation

## 2022-07-06 DIAGNOSIS — C160 Malignant neoplasm of cardia: Secondary | ICD-10-CM

## 2022-07-06 DIAGNOSIS — R5381 Other malaise: Secondary | ICD-10-CM | POA: Diagnosis not present

## 2022-07-06 DIAGNOSIS — I10 Essential (primary) hypertension: Secondary | ICD-10-CM | POA: Diagnosis not present

## 2022-07-06 DIAGNOSIS — G2581 Restless legs syndrome: Secondary | ICD-10-CM | POA: Diagnosis not present

## 2022-07-06 DIAGNOSIS — K219 Gastro-esophageal reflux disease without esophagitis: Secondary | ICD-10-CM | POA: Diagnosis not present

## 2022-07-06 DIAGNOSIS — T451X5A Adverse effect of antineoplastic and immunosuppressive drugs, initial encounter: Secondary | ICD-10-CM | POA: Diagnosis not present

## 2022-07-06 DIAGNOSIS — E039 Hypothyroidism, unspecified: Secondary | ICD-10-CM | POA: Diagnosis not present

## 2022-07-06 MED ORDER — PROCHLORPERAZINE MALEATE 10 MG PO TABS: 10.0000 mg | ORAL_TABLET | Freq: Four times a day (QID) | ORAL | 1 refills | Status: DC | PRN

## 2022-07-06 MED ORDER — ONDANSETRON HCL 8 MG PO TABS: 8.0000 mg | ORAL_TABLET | Freq: Three times a day (TID) | ORAL | 0 refills | Status: DC | PRN

## 2022-07-15 ENCOUNTER — Encounter (HOSPITAL_COMMUNITY)
Admission: RE | Admit: 2022-07-15 | Discharge: 2022-07-15 | Disposition: A | Discharge status: Home / Self Care | Payer: Medicare HMO | Source: Ambulatory Visit | Attending: Hematology & Oncology | Admitting: Hematology & Oncology

## 2022-07-15 DIAGNOSIS — C160 Malignant neoplasm of cardia: Secondary | ICD-10-CM | POA: Insufficient documentation

## 2022-07-15 DIAGNOSIS — D739 Disease of spleen, unspecified: Secondary | ICD-10-CM | POA: Diagnosis not present

## 2022-07-15 DIAGNOSIS — S2242XA Multiple fractures of ribs, left side, initial encounter for closed fracture: Secondary | ICD-10-CM | POA: Diagnosis not present

## 2022-07-15 DIAGNOSIS — C169 Malignant neoplasm of stomach, unspecified: Secondary | ICD-10-CM | POA: Diagnosis not present

## 2022-07-15 DIAGNOSIS — J329 Chronic sinusitis, unspecified: Secondary | ICD-10-CM | POA: Diagnosis not present

## 2022-07-15 LAB — GLUCOSE, CAPILLARY: Glucose-Capillary: 95 mg/dL (ref 70–99)

## 2022-07-15 MED ORDER — FLUDEOXYGLUCOSE F - 18 (FDG) INJECTION
7.5400 | Freq: Once | INTRAVENOUS | Status: AC | PRN
  Administered 2022-07-15: 7.54 via INTRAVENOUS

## 2022-07-16 ENCOUNTER — Other Ambulatory Visit: Payer: Self-pay | Admitting: *Deleted

## 2022-07-16 ENCOUNTER — Encounter: Payer: Self-pay | Admitting: *Deleted

## 2022-07-16 ENCOUNTER — Inpatient Hospital Stay: Payer: Medicare HMO | Attending: Hematology & Oncology

## 2022-07-16 ENCOUNTER — Encounter: Payer: Self-pay | Admitting: Hematology & Oncology

## 2022-07-16 ENCOUNTER — Inpatient Hospital Stay (HOSPITAL_BASED_OUTPATIENT_CLINIC_OR_DEPARTMENT_OTHER): Payer: Medicare HMO | Admitting: Hematology & Oncology

## 2022-07-16 VITALS — BP 153/75 | HR 82 | Temp 97.8°F | Resp 20 | Ht 69.0 in | Wt 150.1 lb

## 2022-07-16 DIAGNOSIS — C160 Malignant neoplasm of cardia: Secondary | ICD-10-CM

## 2022-07-16 DIAGNOSIS — R5383 Other fatigue: Secondary | ICD-10-CM | POA: Diagnosis not present

## 2022-07-16 DIAGNOSIS — C162 Malignant neoplasm of body of stomach: Secondary | ICD-10-CM | POA: Diagnosis not present

## 2022-07-16 LAB — CMP (CANCER CENTER ONLY)
ALT: 14 U/L (ref 0–44)
AST: 13 U/L — ABNORMAL LOW (ref 15–41)
Albumin: 4.2 g/dL (ref 3.5–5.0)
Alkaline Phosphatase: 91 U/L (ref 38–126)
Anion gap: 9 (ref 5–15)
BUN: 16 mg/dL (ref 8–23)
CO2: 28 mmol/L (ref 22–32)
Calcium: 9.7 mg/dL (ref 8.9–10.3)
Chloride: 102 mmol/L (ref 98–111)
Creatinine: 1.06 mg/dL (ref 0.61–1.24)
GFR, Estimated: >60 mL/min (ref >60)
Glucose, Bld: 134 mg/dL — ABNORMAL HIGH (ref 70–99)
Potassium: 4.4 mmol/L (ref 3.5–5.1)
Sodium: 139 mmol/L (ref 135–145)
Total Bilirubin: 0.6 mg/dL (ref 0.3–1.2)
Total Protein: 7.2 g/dL (ref 6.5–8.1)

## 2022-07-16 LAB — CBC WITH DIFFERENTIAL (CANCER CENTER ONLY)
Abs Immature Granulocytes: 0.03 10*3/uL (ref 0.00–0.07)
Basophils Absolute: 0.1 10*3/uL (ref 0.0–0.1)
Basophils Relative: 1 %
Eosinophils Absolute: 0.3 10*3/uL (ref 0.0–0.5)
Eosinophils Relative: 4 %
HCT: 42.1 % (ref 39.0–52.0)
Hemoglobin: 13.3 g/dL (ref 13.0–17.0)
Immature Granulocytes: 1 %
Lymphocytes Relative: 16 %
Lymphs Abs: 1.1 10*3/uL (ref 0.7–4.0)
MCH: 28.2 pg (ref 26.0–34.0)
MCHC: 31.6 g/dL (ref 30.0–36.0)
MCV: 89.2 fL (ref 80.0–100.0)
Monocytes Absolute: 0.6 10*3/uL (ref 0.1–1.0)
Monocytes Relative: 9 %
Neutro Abs: 4.6 10*3/uL (ref 1.7–7.7)
Neutrophils Relative %: 69 %
Platelet Count: 334 10*3/uL (ref 150–400)
RBC: 4.72 MIL/uL (ref 4.22–5.81)
RDW: 13.7 % (ref 11.5–15.5)
WBC Count: 6.6 10*3/uL (ref 4.0–10.5)
nRBC: 0 % (ref 0.0–0.2)

## 2022-07-16 LAB — LACTATE DEHYDROGENASE: LDH: 96 U/L — ABNORMAL LOW (ref 98–192)

## 2022-07-16 MED ORDER — LEVOTHYROXINE SODIUM 75 MCG PO TABS: 75.0000 ug | ORAL_TABLET | Freq: Every day | ORAL | 4 refills | Status: DC

## 2022-07-16 MED ORDER — DRONABINOL 2.5 MG PO CAPS: 2.5000 mg | ORAL_CAPSULE | Freq: Two times a day (BID) | ORAL | 0 refills | Status: DC

## 2022-07-16 NOTE — Progress Notes (Signed)
Hematology and Oncology Follow Up Visit  George Dyer [id number] [birth year] 82 y.o. 07/16/2022   Principle Diagnosis:  Stage IIB (T3N0M0) Gastric cancer -- declines surgery  Current Therapy:   FOLFOX - s/p cycle #4-  start on 09/15/2021 XRT/Xeloda -- start on 12/07/2021 --completed on 01/12/2022     Interim History:  George Dyer is back for follow-up.  He has feel little more fatigued.  He also is not eating much.  His weight is holding steady.  He had a PET scan that was done a couple days ago.  The PET scan did show a little bit of activity along one of the borders of the stomach.  However, there is no stomach thickening.  The activity was with an SUV of 6.4.  There is no obvious metastatic disease.  I will try him on some Marinol.  Will try 2.5 mg p.o. twice daily and let see if this may help with his appetite.  He had a TSH done a week or so ago.  This was on the high side.  We will increase his Synthroid to 75 mcg a day.  He has had no change in bowel or bladder habits.  He has had no diarrhea.  He sees a pain specialist next week because of his legs.  He is on Cymbalta.  He has had no fever.  He has had no rashes.  He has had no cough or shortness of breath.  Overall, I would say that his performance status is probably ECOG 2.  This    Medications:  Current Outpatient Medications:    acetaminophen (TYLENOL) 325 MG tablet, Take 650 mg by mouth 2 (two) times daily as needed., Disp: , Rfl:    levothyroxine (SYNTHROID) 50 MCG tablet, Take 50 mcg by mouth daily., Disp: , Rfl:    omeprazole (PRILOSEC) 40 MG capsule, Take 1 capsule (40 mg total) by mouth 2 (two) times daily before a meal. (Patient taking differently: Take 40 mg by mouth 2 (two) times daily as needed.), Disp: 60 capsule, Rfl: 12   ondansetron (ZOFRAN) 8 MG tablet, Take 1 tablet (8 mg total) by mouth every 8 (eight) hours as needed for nausea or vomiting., Disp: 20 tablet, Rfl: 0   polyethylene glycol (MIRALAX /  GLYCOLAX) 17 g packet, Take 17 g by mouth daily., Disp: , Rfl:    prochlorperazine (COMPAZINE) 10 MG tablet, Take 1 tablet (10 mg total) by mouth every 6 (six) hours as needed (Nausea or vomiting)., Disp: 30 tablet, Rfl: 1   temazepam (RESTORIL) 7.5 MG capsule, Take 1 capsule (7.5 mg total) by mouth at bedtime as needed for sleep., Disp: 30 capsule, Rfl: 0   traMADol (ULTRAM) 50 MG tablet, Take 50 mg by mouth every 6 (six) hours as needed., Disp: , Rfl:    alum & mag hydroxide-simeth (MAALOX/MYLANTA) 200-200-20 MG/5ML suspension, Take 30 mLs by mouth daily. (Patient not taking: Reported on 07/16/2022), Disp: , Rfl:    DULoxetine (CYMBALTA) 30 MG capsule, Take 2 capsules (60 mg total) by mouth at bedtime. Take 1 capsule (30 mg) for the 1st week, then increase to 2 capsules thereafter., Disp: 60 capsule, Rfl: 3   lisinopril-hydrochlorothiazide (ZESTORETIC) 20-12.5 MG tablet, Take 1 tablet by mouth daily. (Patient not taking: Reported on 07/16/2022), Disp: 90 tablet, Rfl: 3 No current facility-administered medications for this visit.  Facility-Administered Medications Ordered in Other Visits:    heparin lock flush 100 unit/mL, 500 Units, Intravenous, Once, Ecko Dyer, Rudell Cobb, MD  HYDROcodone-acetaminophen (NORCO/VICODIN) 5-325 MG per tablet 1 tablet, 1 tablet, Oral, Once, Celso Amy, NP   sodium chloride flush (NS) 0.9 % injection 10 mL, 10 mL, Intravenous, Once, Waldemar Siegel, Rudell Cobb, MD  Allergies:  Allergies  Allergen Reactions   Percocet [Oxycodone-Acetaminophen] Nausea And Vomiting   Oxycodone-Aspirin Nausea And Vomiting    Past Medical History, Surgical history, Social history, and Family History were reviewed and updated.  Review of Systems: Review of Systems  Constitutional:  Positive for fatigue.  HENT:  Negative.    Eyes: Negative.   Respiratory: Negative.    Cardiovascular: Negative.   Gastrointestinal:  Positive for nausea.  Endocrine: Negative.   Genitourinary: Negative.     Musculoskeletal: Negative.   Skin: Negative.   Neurological: Negative.   Hematological: Negative.   Psychiatric/Behavioral: Negative.      Physical Exam:  height is 5' 9"$  (1.753 m) and weight is 150 lb 1.3 oz (68.1 kg). His oral temperature is 97.8 F (36.6 C). His blood pressure is 153/75 (abnormal) and his pulse is 82. His respiration is 20 and oxygen saturation is 98%.   Wt Readings from Last 3 Encounters:  07/16/22 150 lb 1.3 oz (68.1 kg)  05/12/22 151 lb (68.5 kg)  05/05/22 151 lb 6.4 oz (68.7 kg)    Physical Exam Vitals reviewed.  HENT:     Head: Normocephalic and atraumatic.  Eyes:     Pupils: Pupils are equal, round, and reactive to light.  Cardiovascular:     Rate and Rhythm: Normal rate and regular rhythm.     Heart sounds: Normal heart sounds.  Pulmonary:     Effort: Pulmonary effort is normal.     Breath sounds: Normal breath sounds.  Abdominal:     General: Bowel sounds are normal.     Palpations: Abdomen is soft.  Musculoskeletal:        General: No tenderness or deformity. Normal range of motion.     Cervical back: Normal range of motion.  Lymphadenopathy:     Cervical: No cervical adenopathy.  Skin:    General: Skin is warm and dry.     Findings: No erythema or rash.  Neurological:     Mental Status: He is alert and oriented to person, place, and time.  Psychiatric:        Behavior: Behavior normal.        Thought Content: Thought content normal.        Judgment: Judgment normal.   Lab Results  Component Value Date   WBC 6.6 07/16/2022   HGB 13.3 07/16/2022   HCT 42.1 07/16/2022   MCV 89.2 07/16/2022   PLT 334 07/16/2022     Chemistry      Component Value Date/Time   NA 139 07/16/2022 0903   NA 139 07/23/2020 0818   K 4.4 07/16/2022 0903   CL 102 07/16/2022 0903   CO2 28 07/16/2022 0903   BUN 16 07/16/2022 0903   BUN 10 07/23/2020 0818   CREATININE 1.06 07/16/2022 0903   CREATININE 1.06 06/27/2015 0837      Component Value  Date/Time   CALCIUM 9.7 07/16/2022 0903   ALKPHOS 91 07/16/2022 0903   AST 13 (L) 07/16/2022 0903   ALT 14 07/16/2022 0903   BILITOT 0.6 07/16/2022 0903      Impression and Plan: George Dyer is a very nice 82 year old white male.  He has what I hope to believe is localized gastric cancer.   He has had 4 cycles  of chemotherapy.  By the follow-up scans and endoscopy, he really did not have much of a response.  We subsequently gave him radiation with chemotherapy.  He had low-dose Xeloda.  He has residual disease.  This was biopsy-proven.  He has not wanted any treatment for this.  Again the PET scan might be a little bit troublesome.  The fact that is not eating as much is also a little bit troublesome.  We are going to have to watch this closely.  I would like to have him come back in a month.  Maybe, we will see that he is feeling a little better.  We probably have to do a PET scan in April or May.   Volanda Napoleon, MD 2/9/202410:07 AM

## 2022-07-19 ENCOUNTER — Other Ambulatory Visit: Payer: Self-pay | Admitting: Hematology & Oncology

## 2022-07-19 ENCOUNTER — Encounter: Payer: Medicare HMO | Attending: Physical Medicine and Rehabilitation | Admitting: Physical Medicine and Rehabilitation

## 2022-07-19 ENCOUNTER — Encounter: Payer: Self-pay | Admitting: Physical Medicine and Rehabilitation

## 2022-07-19 VITALS — BP 130/75 | HR 83 | Ht 69.0 in | Wt 150.0 lb

## 2022-07-19 DIAGNOSIS — G4701 Insomnia due to medical condition: Secondary | ICD-10-CM | POA: Insufficient documentation

## 2022-07-19 DIAGNOSIS — R7303 Prediabetes: Secondary | ICD-10-CM | POA: Insufficient documentation

## 2022-07-19 DIAGNOSIS — R7301 Impaired fasting glucose: Secondary | ICD-10-CM | POA: Diagnosis not present

## 2022-07-19 DIAGNOSIS — C160 Malignant neoplasm of cardia: Secondary | ICD-10-CM

## 2022-07-19 DIAGNOSIS — G629 Polyneuropathy, unspecified: Secondary | ICD-10-CM | POA: Insufficient documentation

## 2022-07-19 NOTE — Patient Instructions (Addendum)
Foods that cause pain: 1) Ginger (especially studied for arthritis)- reduce leukotriene production to decrease inflammation 2) Blueberries- high in phytonutrients that decrease inflammation 3) Salmon- marine omega-3s reduce joint swelling and pain 4) Pumpkin seeds- reduce inflammation 5) dark chocolate- reduces inflammation 6) turmeric- reduces inflammation 7) tart cherries - reduce pain and stiffness 8) extra virgin olive oil - its compound olecanthal helps to block prostaglandins  9) chili peppers- can be eaten or applied topically via capsaicin 10) mint- helpful for headache, muscle aches, joint pain, and itching 11) garlic- reduces inflammation  Link to further information on diet for chronic pain: [email or web address]   [email or web address]

## 2022-07-19 NOTE — Progress Notes (Signed)
Subjective:    Patient ID: George Dyer, male    DOB: [birth year], 82 y.o.   MRN: [id number]  HPIMr. Sharpley is an 82 year old man who presents to establish care for 25 years.  1) Pain in bilateral feet -started 25 years ago -worsened in the past year -he takes tramadol and it gives him 3 hours of relief- sometimes it stops the pain -pain makes him worse at night -tylenol does not help.   2) Cancer -saw Dr. Janice Coffin on Friday.   3) Insomnia     Pain Inventory Average Pain 10 Pain Right Now 10 My pain is constant, sharp, and burning  What TIME of day is your pain at its worst? morning , daytime, evening, and night Sleep (in general) Poor  Pain is worse with: walking Pain improves with: medication Relief from Meds:  tramadol helps some  Walks with out assistance Climbs steps Drives    Family History  Problem Relation Age of Onset   Hypertension Mother    Heart attack Father    Heart disease Father    Cancer Maternal Aunt        breast   Esophageal cancer Maternal Uncle    Cancer Maternal Uncle    Colon cancer Neg Hx    Stomach cancer Neg Hx    Rectal cancer Neg Hx    Social History   Socioeconomic History   Marital status: Married    Spouse name: Not on file   Number of children: 3   Years of education: Not on file   Highest education level: Not on file  Occupational History   Occupation: self employed  Tobacco Use   Smoking status: Former    Packs/day: 0.25    Years: 6.00    Total pack years: 1.50    Types: Cigarettes    Quit date: 03/25/1981    Years since quitting: 41.3   Smokeless tobacco: Never  Vaping Use   Vaping Use: Never used  Substance and Sexual Activity   Alcohol use: No   Drug use: No   Sexual activity: Not Currently  Other Topics Concern   Not on file  Social History Narrative   Are you right handed or left handed? right   Are you currently employed ? retired   What is your current occupation?   Do you live at home  alone? With wife   Caffeine 1-2 cups daily   What type of home do you live in: 1 story or 2 story? one       Social Determinants of Health   Financial Resource Strain: Not on file  Food Insecurity: Not on file  Transportation Needs: Not on file  Physical Activity: Not on file  Stress: Not on file  Social Connections: Not on file   Past Surgical History:  Procedure Laterality Date   BIOPSY  08/13/2021   Procedure: BIOPSY;  Surgeon: Rush Landmark, Telford Nab., MD;  Location: [facility];  Service: Gastroenterology;;   BREAST BIOPSY     RIGHT BREAST   CARDIAC SURGERY  1970's   wife wasn't sure what type of heart surgery.   COLONOSCOPY  08/06/2021   [name]   ESOPHAGOGASTRODUODENOSCOPY (EGD) WITH PROPOFOL N/A 08/13/2021   Procedure: ESOPHAGOGASTRODUODENOSCOPY (EGD) WITH PROPOFOL;  Surgeon: Irving Copas., MD;  Location: [place];  Service: Gastroenterology;  Laterality: N/A;   EUS N/A 08/13/2021   Procedure: UPPER ENDOSCOPIC ULTRASOUND (EUS) RADIAL;  Surgeon: Rush Landmark Telford Nab., MD;  Location: [place];  Service: Gastroenterology;  Laterality: N/A;   INGUINAL HERNIA REPAIR  07/10/2012   Procedure: LAPAROSCOPIC INGUINAL HERNIA;  Surgeon: Ralene Ok, MD;  Location: [place];  Service: General;  Laterality: Right;   INGUINAL HERNIA REPAIR  07/10/2012   [facility]   INSERTION OF MESH  07/10/2012   Procedure: INSERTION OF MESH;  Surgeon: Ralene Ok, MD;  Location: [place];  Service: General;  Laterality: Right;   IR PATIENT EVAL TECH 0-60 MINS  11/26/2021   LUMBAR DISC SURGERY     ORIF HUMERUS FRACTURE Left 01/13/2016   Procedure: OPEN REDUCTION INTERNAL FIXATION (ORIF) LEFT PROXIMAL HUMERUS FRACTURE;  Surgeon: Roseanne Kaufman, MD;  Location: [place];  Service: Orthopedics;  Laterality: Left;   TONSILLECTOMY     US ECHOCARDIOGRAPHY  04/20/1993   EF 65%   Past Surgical History:  Procedure Laterality Date   BIOPSY  08/13/2021   Procedure: BIOPSY;  Surgeon: Rush Landmark  Telford Nab., MD;  Location: [place];  Service: Gastroenterology;;   BREAST BIOPSY     RIGHT BREAST   CARDIAC SURGERY  1970's   wife wasn't sure what type of heart surgery.   COLONOSCOPY  08/06/2021   [name]   ESOPHAGOGASTRODUODENOSCOPY (EGD) WITH PROPOFOL N/A 08/13/2021   Procedure: ESOPHAGOGASTRODUODENOSCOPY (EGD) WITH PROPOFOL;  Surgeon: Irving Copas., MD;  Location: [place];  Service: Gastroenterology;  Laterality: N/A;   EUS N/A 08/13/2021   Procedure: UPPER ENDOSCOPIC ULTRASOUND (EUS) RADIAL;  Surgeon: Irving Copas., MD;  Location: [place];  Service: Gastroenterology;  Laterality: N/A;   INGUINAL HERNIA REPAIR  07/10/2012   Procedure: LAPAROSCOPIC INGUINAL HERNIA;  Surgeon: Ralene Ok, MD;  Location: [place];  Service: General;  Laterality: Right;   INGUINAL HERNIA REPAIR  07/10/2012   [facility]   INSERTION OF MESH  07/10/2012   Procedure: INSERTION OF MESH;  Surgeon: Ralene Ok, MD;  Location: [place];  Service: General;  Laterality: Right;   IR PATIENT EVAL TECH 0-60 MINS  11/26/2021   LUMBAR DISC SURGERY     ORIF HUMERUS FRACTURE Left 01/13/2016   Procedure: OPEN REDUCTION INTERNAL FIXATION (ORIF) LEFT PROXIMAL HUMERUS FRACTURE;  Surgeon: Roseanne Kaufman, MD;  Location: [place];  Service: Orthopedics;  Laterality: Left;   TONSILLECTOMY     US ECHOCARDIOGRAPHY  04/20/1993   EF 65%   Past Medical History:  Diagnosis Date   Anxiety    Arthritis    Constipation    ED (erectile dysfunction)    GERD (gastroesophageal reflux disease)    Goals of care, counseling/discussion 08/18/2021   Headache    History of radiation therapy    Stomach- 12/09/21-01/12/22-Dr. 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   SVC syndrome    SECONDARY TO CHRONIC FIBROSING MEDIASTINITIS   Thyroid disease    BP 130/75   Pulse 83   Ht 5' 9"$  (1.753 m)    Wt 150 lb (68 kg)   SpO2 95%   BMI 22.15 kg/m   Opioid Risk Score:   Fall Risk Score:  `1  Depression screen [facility] 2/9     07/19/2022    9:28 AM  Depression screen PHQ 2/9  Decreased Interest 3  Down, Depressed, Hopeless 3  PHQ - 2 Score 6  Altered sleeping 3  Tired, decreased energy 3  Change in appetite 3  Feeling bad or  failure about yourself  0  Trouble concentrating 0  Moving slowly or fidgety/restless 0  Suicidal thoughts 0  PHQ-9 Score 15    Review of Systems  Constitutional:        Weight loss  Gastrointestinal:  Positive for constipation.  Musculoskeletal:        Pain in both legs from the the knees down  All other systems reviewed and are negative.      Objective:   Physical Exam  Gen: no distress, normal appearing, weight 150 lbs, BMI 22.15, BP 130/75 HEENT: oral mucosa pink and moist, NCAT Cardio: Reg rate Chest: normal effort, normal rate of breathing Abd: soft, non-distended Ext: no edema Psych: pleasant, normal affect Skin: intact, no open lesions on feet, sensation intact Neuro: Alert and oriented x3      Assessment & Plan:   1) Chronic Pain Syndrome secondary to neuropathy -Discussed current symptoms of pain and history of pain.  -Discussed benefits of exercise in reducing pain. -Discussed following foods that may reduce pain: 1) Ginger (especially studied for arthritis)- reduce leukotriene production to decrease inflammation 2) Blueberries- high in phytonutrients that decrease inflammation 3) Salmon- marine omega-3s reduce joint swelling and pain 4) Pumpkin seeds- reduce inflammation 5) dark chocolate- reduces inflammation 6) turmeric- reduces inflammation 7) tart cherries - reduce pain and stiffness 8) extra virgin olive oil - its compound olecanthal helps to block prostaglandins  9) chili peppers- can be eaten or applied topically via capsaicin 10) mint- helpful for headache, muscle aches, joint pain, and itching 11) garlic- reduces  inflammation  Link to further information on diet for chronic pain: http://www.randall.com/   -Discussed Qutenza as an option for neuropathic pain control. Discussed that this is a capsaicin patch, stronger than capsaicin cream. Discussed that it is currently approved for diabetic peripheral neuropathy and post-herpetic neuralgia, but that it has also shown benefit in treating other forms of neuropathy. Provided patient with link to site to learn more about the patch: CinemaBonus.fr. Discussed that the patch would be placed in office and benefits usually last 3 months. Discussed that unintended exposure to capsaicin can cause severe irritation of eyes, mucous membranes, respiratory tract, and skin, but that Qutenza is a local treatment and does not have the systemic side effects of other nerve medications. Discussed that there may be pain, itching, erythema, and decreased sensory function associated with the application of Qutenza. Side effects usually subside within 1 week. A cold pack of analgesic medications can help with these side effects. Blood pressure can also be increased due to pain associated with administration of the patch.   2) Impaired fasting blood sugar -check HgbA1c

## 2022-07-20 ENCOUNTER — Other Ambulatory Visit: Payer: Self-pay

## 2022-07-20 ENCOUNTER — Encounter: Payer: Self-pay | Admitting: *Deleted

## 2022-07-20 DIAGNOSIS — C160 Malignant neoplasm of cardia: Secondary | ICD-10-CM

## 2022-07-20 LAB — HEMOGLOBIN A1C
Est. average glucose Bld gHb Est-mCnc: 117 mg/dL
Hgb A1c MFr Bld: 5.7 % — ABNORMAL HIGH (ref 4.8–5.6)

## 2022-07-20 MED ORDER — TEMAZEPAM 7.5 MG PO CAPS: 7.5000 mg | ORAL_CAPSULE | Freq: Every evening | ORAL | 0 refills | Status: DC | PRN

## 2022-07-22 ENCOUNTER — Encounter (HOSPITAL_BASED_OUTPATIENT_CLINIC_OR_DEPARTMENT_OTHER): Payer: Medicare HMO | Admitting: Physical Medicine and Rehabilitation

## 2022-07-22 DIAGNOSIS — G629 Polyneuropathy, unspecified: Secondary | ICD-10-CM

## 2022-07-22 DIAGNOSIS — R7303 Prediabetes: Secondary | ICD-10-CM | POA: Diagnosis not present

## 2022-07-22 MED ORDER — METFORMIN HCL 500 MG PO TABS: 500.0000 mg | ORAL_TABLET | Freq: Every day | ORAL | 3 refills | Status: DC

## 2022-07-22 NOTE — Progress Notes (Signed)
Subjective:    Patient ID: George Dyer, male    DOB: 1940-12-01, 82 y.o.   MRN: [id number]  HPIMr. Schatzel is an 82 year old man who presents to establish care for 25 years of neuropathy  1) Pain in bilateral feet -started 25 years ago -he was not aware he had prediabetes but HgbA1c returned as 5.7 -he is willing to try metformin -did not tolerate cymbalta or gabapentin well in the past -worsened in the past year -he takes tramadol and it gives him 3 hours of relief- sometimes it stops the pain -pain makes him worse at night -tylenol does not help.   2) Cancer -saw Dr. Janice Coffin on Friday.   3) Insomnia     Pain Inventory Average Pain 10 Pain Right Now 10 My pain is constant, sharp, and burning  What TIME of day is your pain at its worst? morning , daytime, evening, and night Sleep (in general) Poor  Pain is worse with: walking Pain improves with: medication Relief from Meds:  tramadol helps some  Walks with out assistance Climbs steps Drives    Family History  Problem Relation Age of Onset   Hypertension Mother    Heart attack Father    Heart disease Father    Cancer Maternal Aunt        breast   Esophageal cancer Maternal Uncle    Cancer Maternal Uncle    Colon cancer Neg Hx    Stomach cancer Neg Hx    Rectal cancer Neg Hx    Social History   Socioeconomic History   Marital status: Married    Spouse name: Not on file   Number of children: 3   Years of education: Not on file   Highest education level: Not on file  Occupational History   Occupation: self employed  Tobacco Use   Smoking status: Former    Packs/day: 0.25    Years: 6.00    Total pack years: 1.50    Types: Cigarettes    Quit date: 03/25/1981    Years since quitting: 41.3   Smokeless tobacco: Never  Vaping Use   Vaping Use: Never used  Substance and Sexual Activity   Alcohol use: No   Drug use: No   Sexual activity: Not Currently  Other Topics Concern   Not on file   Social History Narrative   Are you right handed or left handed? right   Are you currently employed ? retired   What is your current occupation?   Do you live at home alone? With wife   Caffeine 1-2 cups daily   What type of home do you live in: 1 story or 2 story? one       Social Determinants of Health   Financial Resource Strain: Not on file  Food Insecurity: Not on file  Transportation Needs: Not on file  Physical Activity: Not on file  Stress: Not on file  Social Connections: Not on file   Past Surgical History:  Procedure Laterality Date   BIOPSY  08/13/2021   Procedure: BIOPSY;  Surgeon: Rush Landmark, Telford Nab., MD;  Location: [facility] ENDOSCOPY;  Service: Gastroenterology;;   BREAST BIOPSY     RIGHT BREAST   CARDIAC SURGERY  1970's   wife wasn't sure what type of heart surgery.   COLONOSCOPY  08/06/2021   [name]   ESOPHAGOGASTRODUODENOSCOPY (EGD) WITH PROPOFOL N/A 08/13/2021   Procedure: ESOPHAGOGASTRODUODENOSCOPY (EGD) WITH PROPOFOL;  Surgeon: Irving Copas., MD;  Location: [facility]  ENDOSCOPY;  Service: Gastroenterology;  Laterality: N/A;   EUS N/A 08/13/2021   Procedure: UPPER ENDOSCOPIC ULTRASOUND (EUS) RADIAL;  Surgeon: Irving Copas., MD;  Location: [place];  Service: Gastroenterology;  Laterality: N/A;   INGUINAL HERNIA REPAIR  07/10/2012   Procedure: LAPAROSCOPIC INGUINAL HERNIA;  Surgeon: Ralene Ok, MD;  Location: [place];  Service: General;  Laterality: Right;   INGUINAL HERNIA REPAIR  07/10/2012   [facility]   INSERTION OF MESH  07/10/2012   Procedure: INSERTION OF MESH;  Surgeon: Ralene Ok, MD;  Location: [place];  Service: General;  Laterality: Right;   IR PATIENT EVAL TECH 0-60 MINS  11/26/2021   LUMBAR DISC SURGERY     ORIF HUMERUS FRACTURE Left 01/13/2016   Procedure: OPEN REDUCTION INTERNAL FIXATION (ORIF) LEFT PROXIMAL HUMERUS FRACTURE;  Surgeon: Roseanne Kaufman, MD;  Location: [place];  Service: Orthopedics;  Laterality: Left;    TONSILLECTOMY     US ECHOCARDIOGRAPHY  04/20/1993   EF 65%   Past Surgical History:  Procedure Laterality Date   BIOPSY  08/13/2021   Procedure: BIOPSY;  Surgeon: Rush Landmark Telford Nab., MD;  Location: [place];  Service: Gastroenterology;;   BREAST BIOPSY     RIGHT BREAST   CARDIAC SURGERY  1970's   wife wasn't sure what type of heart surgery.   COLONOSCOPY  08/06/2021   [name]   ESOPHAGOGASTRODUODENOSCOPY (EGD) WITH PROPOFOL N/A 08/13/2021   Procedure: ESOPHAGOGASTRODUODENOSCOPY (EGD) WITH PROPOFOL;  Surgeon: Irving Copas., MD;  Location: [place];  Service: Gastroenterology;  Laterality: N/A;   EUS N/A 08/13/2021   Procedure: UPPER ENDOSCOPIC ULTRASOUND (EUS) RADIAL;  Surgeon: Irving Copas., MD;  Location: [place];  Service: Gastroenterology;  Laterality: N/A;   INGUINAL HERNIA REPAIR  07/10/2012   Procedure: LAPAROSCOPIC INGUINAL HERNIA;  Surgeon: Ralene Ok, MD;  Location: [place];  Service: General;  Laterality: Right;   INGUINAL HERNIA REPAIR  07/10/2012   [facility]   INSERTION OF MESH  07/10/2012   Procedure: INSERTION OF MESH;  Surgeon: Ralene Ok, MD;  Location: [place];  Service: General;  Laterality: Right;   IR PATIENT EVAL TECH 0-60 MINS  11/26/2021   LUMBAR DISC SURGERY     ORIF HUMERUS FRACTURE Left 01/13/2016   Procedure: OPEN REDUCTION INTERNAL FIXATION (ORIF) LEFT PROXIMAL HUMERUS FRACTURE;  Surgeon: Roseanne Kaufman, MD;  Location: [place];  Service: Orthopedics;  Laterality: Left;   TONSILLECTOMY     US ECHOCARDIOGRAPHY  04/20/1993   EF 65%   Past Medical History:  Diagnosis Date   Anxiety    Arthritis    Constipation    ED (erectile dysfunction)    GERD (gastroesophageal reflux disease)    Goals of care, counseling/discussion 08/18/2021   Headache    History of radiation therapy    Stomach- 12/09/21-01/12/22-Dr. Gery Pray   Hypertension    Kidney stone    Lumbar disc disease    Pinched nerve    Pneumonia    Primary  cancer of cardia of stomach ([place]) 08/18/2021   SVC syndrome    SECONDARY TO CHRONIC FIBROSING MEDIASTINITIS   Thyroid disease    There were no vitals taken for this visit.  Opioid Risk Score:   Fall Risk Score:  `1  Depression screen [facility] 2/9     07/19/2022    9:28 AM  Depression screen PHQ 2/9  Decreased Interest 3  Down, Depressed, Hopeless 3  PHQ - 2 Score 6  Altered sleeping 3  Tired, decreased energy 3  Change in appetite 3  Feeling bad or failure about yourself  0  Trouble concentrating 0  Moving slowly or fidgety/restless 0  Suicidal thoughts 0  PHQ-9 Score 15    Review of Systems  Constitutional:        Weight loss  Gastrointestinal:  Positive for constipation.  Musculoskeletal:        Pain in both legs from the the knees down  All other systems reviewed and are negative.      Objective:   Physical Exam  Gen: no distress, normal appearing, weight 150 lbs, BMI 22.15, BP 130/75 HEENT: oral mucosa pink and moist, NCAT Cardio: Reg rate Chest: normal effort, normal rate of breathing Abd: soft, non-distended Ext: no edema Psych: pleasant, normal affect Skin: intact, no open lesions on feet, sensation intact Neuro: Alert and oriented x3      Assessment & Plan:   1) Chronic Pain Syndrome secondary to neuropathy -Discussed current symptoms of pain and history of pain.  -discussed that prediabetes could be a contributory factor -start metformin 542m daily -Discussed benefits of exercise in reducing pain. -Discussed following foods that may reduce pain: 1) Ginger (especially studied for arthritis)- reduce leukotriene production to decrease inflammation 2) Blueberries- high in phytonutrients that decrease inflammation 3) Salmon- marine omega-3s reduce joint swelling and pain 4) Pumpkin seeds- reduce inflammation 5) dark chocolate- reduces inflammation 6) turmeric- reduces inflammation 7) tart cherries - reduce pain and stiffness 8) extra virgin olive  oil - its compound olecanthal helps to block prostaglandins  9) chili peppers- can be eaten or applied topically via capsaicin 10) mint- helpful for headache, muscle aches, joint pain, and itching 11) garlic- reduces inflammation  Link to further information on diet for chronic pain: hhttp://www.randall.com/  -Discussed Qutenza as an option for neuropathic pain control. Discussed that this is a capsaicin patch, stronger than capsaicin cream. Discussed that it is currently approved for diabetic peripheral neuropathy and post-herpetic neuralgia, but that it has also shown benefit in treating other forms of neuropathy. Provided patient with link to site to learn more about the patch: hCinemaBonus.fr Discussed that the patch would be placed in office and benefits usually last 3 months. Discussed that unintended exposure to capsaicin can cause severe irritation of eyes, mucous membranes, respiratory tract, and skin, but that Qutenza is a local treatment and does not have the systemic side effects of other nerve medications. Discussed that there may be pain, itching, erythema, and decreased sensory function associated with the application of Qutenza. Side effects usually subside within 1 week. A cold pack of analgesic medications can help with these side effects. Blood pressure can also be increased due to pain associated with administration of the patch.   2) Prediabetes -hgbA1c reviewed and is 5.7  7 minutes spent in disucssion of his hemoglobin A1c returning at 5.7 which is consistent with prediabetes, discussed starting metformin as this can help with both his prediabetes and neuropathy

## 2022-08-02 ENCOUNTER — Ambulatory Visit (HOSPITAL_COMMUNITY)
Admission: RE | Admit: 2022-08-02 | Discharge: 2022-08-02 | Disposition: A | Discharge status: Home / Self Care | Payer: Medicare HMO | Source: Ambulatory Visit | Attending: Cardiology | Admitting: Cardiology

## 2022-08-02 DIAGNOSIS — I712 Thoracic aortic aneurysm, without rupture, unspecified: Secondary | ICD-10-CM | POA: Diagnosis not present

## 2022-08-02 DIAGNOSIS — I7121 Aneurysm of the ascending aorta, without rupture: Secondary | ICD-10-CM | POA: Diagnosis not present

## 2022-08-02 MED ORDER — IOHEXOL 350 MG/ML SOLN
75.0000 mL | Freq: Once | INTRAVENOUS | Status: AC | PRN
  Administered 2022-08-02: 75 mL via INTRAVENOUS

## 2022-08-02 NOTE — Progress Notes (Deleted)
NEUROLOGY FOLLOW UP OFFICE NOTE  George Dyer [id number]  Subjective:  George Dyer is a 82 y.o. 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  To briefly review: Patient has had pain in his legs for many years, but it has been very bad over the last year. He describes the pain as shooting pain into his feet. The right side is worse than the left, though he does have similar symptoms in the left leg. He denies tingling or obvious numbness, but his feet are very sensitive to not wanting to be touched. He thinks the symptoms are worse at night. It is also worse with wearing shoes. He feels like his legs get weak when walking. If he sits and rubs his feet, he will feel like things might improve. He denies falls.   Patient denies significant symptoms in his arms. He does have a history of broken left arm (with pins) and arthritis in his hands.   At night he does get the urge to move his legs. He will walk around and his symptoms may improve.    Patient has a history of low back pain and had lumbar fusion (?L4-5) about 10 years ago. Patient does not have clear radiating pain from back, but patient wonders if a nerve is getting pinched in his back.   Patient found out he had gastric cancer in 08/2021. He started chemo in 09/2021. Patient had symptoms prior to chemo, but does think things have gotten much worse since chemo started.   Patient denies problems with chewing or swallowing. He has difficulty keeping food down sometimes though (?2/2 gastric cancer). He also mentions blurry vision in left eye, but no clear diplopia or ptosis.   Patient was given gabapentin for pain during chemotherapy, but he did not like how he felt, so he stopped it after one dose. They are unsure of any other treatments tried.   Cancer history per  Dr. Antonieta Pert clinic note from 03/18/22: Principle Diagnosis:  Stage IIB (T3N0M0) Gastric cancer -- declines surgery   Current Therapy:        FOLFOX - s/p cycle #4-  start on 09/15/2021 XRT/Xeloda -- start on 12/07/2021 --completed on 01/12/2022   EtOH use: None currently, previously would have a cocktail at night  Restrictive diet? No, but does not eat much anymore since gastric cancer Family history of neuropathy/myopathy/NM disease? No   Patient has never had an EMG.  Most recent Assessment and Plan (05/05/22): Patient's symptoms are likely multifactorial with contributions from lumbar spine disease (radiculopathy), neuropathy (risk factors include pre-diabetes and chemotherapy with oxaliplatin and capecitabine), and perhaps restless leg syndrome (could be secondary to neuropathy). Patient likely had baseline symptoms that worsened after chemotherapy. The asymmetry seen may be due to spine disease.   PLAN: -Blood work: IFE, B1 -Discussed EMG, but deferred at this time as he has complicated history that would make results difficult to interpret and likely not change management -MRI lumbar spine wo contrast -Cymbalta: 30 mg daily for 1 week, then increase to 60 mg daily -Lidocaine cream PRN over the counter -Pt has low ferritin (<75) that may be causing RLS symptoms.   -we will add ferrous sulfate 325 mg daily x 3 months.  Discussed not to take with milk.  Discussed that this can cause constipation and to drink plenty of water.   -  take vitamin C, 100-200 mg with each ferrous sulfate dose, or take the ferrous sulfate with small glass of orange juice (if not diabetic)             -will recheck ferritin in the future  Since their last visit: Patient was "loopy and sick" on Cymbalta, so had to stop it. He inquired about methadone that had helped in the past with pain. I referred him to pain management for consideration of this. ***  MEDICATIONS:  Outpatient Encounter Medications as of  08/11/2022  Medication Sig   acetaminophen (TYLENOL) 325 MG tablet Take 650 mg by mouth 2 (two) times daily as needed.   alum & mag hydroxide-simeth (MAALOX/MYLANTA) 200-200-20 MG/5ML suspension Take 30 mLs by mouth daily.   dronabinol (MARINOL) 2.5 MG capsule Take 1 capsule (2.5 mg total) by mouth 2 (two) times daily before a meal.   DULoxetine (CYMBALTA) 30 MG capsule Take 2 capsules (60 mg total) by mouth at bedtime. Take 1 capsule (30 mg) for the 1st week, then increase to 2 capsules thereafter.   levothyroxine (SYNTHROID) 75 MCG tablet Take 1 tablet (75 mcg total) by mouth daily before breakfast.   lisinopril-hydrochlorothiazide (ZESTORETIC) 20-12.5 MG tablet Take 1 tablet by mouth daily.   metFORMIN (GLUCOPHAGE) 500 MG tablet Take 1 tablet (500 mg total) by mouth daily with breakfast.   omeprazole (PRILOSEC) 40 MG capsule Take 1 capsule (40 mg total) by mouth 2 (two) times daily before a meal. (Patient taking differently: Take 40 mg by mouth 2 (two) times daily as needed.)   ondansetron (ZOFRAN) 8 MG tablet Take 1 tablet (8 mg total) by mouth every 8 (eight) hours as needed for nausea or vomiting.   polyethylene glycol (MIRALAX / GLYCOLAX) 17 g packet Take 17 g by mouth daily.   prochlorperazine (COMPAZINE) 10 MG tablet Take 1 tablet (10 mg total) by mouth every 6 (six) hours as needed (Nausea or vomiting).   temazepam (RESTORIL) 7.5 MG capsule Take 1 capsule (7.5 mg total) by mouth at bedtime as needed for sleep.   traMADol (ULTRAM) 50 MG tablet Take 50 mg by mouth every 6 (six) hours as needed.   Facility-Administered Encounter Medications as of 08/11/2022  Medication   heparin lock flush 100 unit/mL   HYDROcodone-acetaminophen (NORCO/VICODIN) 5-325 MG per tablet 1 tablet   sodium chloride flush (NS) 0.9 % injection 10 mL    PAST MEDICAL HISTORY: Past Medical History:  Diagnosis Date   Anxiety    Arthritis    Constipation    ED (erectile dysfunction)    GERD (gastroesophageal reflux  disease)    Goals of care, counseling/discussion 08/18/2021   Headache    History of radiation therapy    Stomach- 12/09/21-01/12/22-Dr. 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   COLONOSCOPY  08/06/2021   [name]   ESOPHAGOGASTRODUODENOSCOPY (EGD) WITH PROPOFOL N/A 08/13/2021  Procedure: ESOPHAGOGASTRODUODENOSCOPY (EGD) WITH PROPOFOL;  Surgeon: Rush Landmark Telford Nab., MD;  Location: [place];  Service: Gastroenterology;  Laterality: N/A;   EUS N/A 08/13/2021   Procedure: UPPER ENDOSCOPIC ULTRASOUND (EUS) RADIAL;  Surgeon: Irving Copas., MD;  Location: [place];  Service: Gastroenterology;  Laterality: N/A;   INGUINAL HERNIA REPAIR  07/10/2012   Procedure: LAPAROSCOPIC INGUINAL HERNIA;  Surgeon: Ralene Ok, MD;  Location: [place];  Service: General;  Laterality: Right;   INGUINAL HERNIA REPAIR  07/10/2012   [facility]   INSERTION OF MESH  07/10/2012   Procedure: INSERTION OF MESH;  Surgeon: Ralene Ok, MD;  Location: [place];  Service: General;  Laterality: Right;   IR PATIENT EVAL TECH 0-60 MINS  11/26/2021   LUMBAR DISC SURGERY     ORIF HUMERUS FRACTURE Left 01/13/2016   Procedure: OPEN REDUCTION INTERNAL FIXATION (ORIF) LEFT PROXIMAL HUMERUS FRACTURE;  Surgeon: Roseanne Kaufman, MD;  Location: [place];  Service: Orthopedics;  Laterality: Left;   TONSILLECTOMY     US ECHOCARDIOGRAPHY  04/20/1993   EF 65%    ALLERGIES: Allergies  Allergen Reactions    Percocet [Oxycodone-Acetaminophen] Nausea And Vomiting   Oxycodone-Aspirin Nausea And Vomiting    FAMILY HISTORY: Family History  Problem Relation Age of Onset   Hypertension Mother    Heart attack Father    Heart disease Father    Cancer Maternal Aunt        breast   Esophageal cancer Maternal Uncle    Cancer Maternal Uncle    Colon cancer Neg Hx    Stomach cancer Neg Hx    Rectal cancer Neg Hx     SOCIAL HISTORY: Social History   Tobacco Use   Smoking status: Former    Packs/day: 0.25    Years: 6.00    Total pack years: 1.50    Types: Cigarettes    Quit date: 03/25/1981    Years since quitting: 41.3   Smokeless tobacco: Never  Vaping Use   Vaping Use: Never used  Substance Use Topics   Alcohol use: No   Drug use: No   Social History   Social History Narrative   Are you right handed or left handed? right   Are you currently employed ? retired   What is your current occupation?   Do you live at home alone? With wife   Caffeine 1-2 cups daily   What type of home do you live in: 1 story or 2 story? one          Objective:  Vital Signs:  There were no vitals taken for this visit.  ***  Labs and Imaging review: New results: 07/06/22: B12: 454 TSH: 6.82  07/16/22: CBC unremarkable CMP unremarkable  HbA1c (07/19/22): 5.7  MRI lumbar spine (06/11/22): FINDINGS: Segmentation:  Standard.   Alignment:  Physiologic.   Vertebrae: No acute fracture, evidence of discitis, or aggressive bone lesion. Small Schmorl's node along the superior endplate of L3. Small hemangioma in the superior posterior corner of the L4 vertebral body.   Conus medullaris and cauda equina: Conus extends to the L1 level. 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 Conus and cauda equina appear otherwise normal.   Paraspinal and other soft tissues: No acute paraspinal abnormality.    Disc levels:   Disc spaces: Posterior lumbar interbody fusion at L4-5. Degenerative disease with disc height loss at L2-3 and L5-S1. Disc desiccation throughout the  lumbar spine.   T12-L1: No significant disc bulge. No neural foraminal stenosis. No central canal stenosis.   L1-L2: Mild broad-based disc bulge. Mild bilateral facet arthropathy. No foraminal or central canal stenosis.   L2-L3: Mild broad-based disc osteophyte complex. Mild bilateral facet arthropathy. No foraminal or central canal stenosis.   L3-L4: Mild broad-based disc bulge. No foraminal or central canal stenosis.   L4-L5: Interbody fusion and laminectomy. No foraminal or central canal stenosis.   L5-S1: No significant disc bulge. No neural foraminal stenosis. No central canal stenosis.   IMPRESSION: 1. Posterior lumbar interbody fusion and laminectomy at L4-5 without foraminal or central canal stenosis. 2. Mild lumbar spine spondylosis as described above. 3. No acute osseous injury of the lumbar spine. 4. 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  Previously reviewed results: Normal or unremarkable: CBC, iron, TIBC, sat ratio, TSH CMP (04/05/22) significant for mildly elevated glucose Ferritin (03/18/22): 67   External labs: B12 (10/08/21): 710 HbA1c (08/24/21): 6.0   CT lumbar spine w contrast (10/15/2005): IMPRESSION:  Status post fusion L4-5 without abnormal motion at this level between flexion and extension.  CT LUMBAR SPINE WITH CONTRAST (POST-MYELOGRAM):  Technique:  Multidetector CT imaging of the lumbar spine was performed after intrathecal injection of contrast.  Multiplanar CT image reconstructions were also generated.  Findings:  Images were obtained from the T11-12 through the mid to lower sacral region.   Conus just below the L1-2 disc space.  No surrounding abnormal vasculature.  Of note, is a  nodular filling defect within the right aspect of the thecal sac intimately related to right sided nerve roots measuring 5.2 x 4.2 mm just below the L2-3 disc space (series 7 image 62).  This may represent a nerve sheath tumor although other causes for such, such as drop metastases or focal arachnoiditis cannot be excluded.  Patient would benefit from MR of the lumbar spine with and without contrast with attention to this region.   T11-12:  Slightly narrowed spinal canal secondary to short pedicles.   T12-L1:  Negative.  L1-2:  Negative.  L2-3:  Mild bulge.   L3-4:  Mild facet joint degenerative changes.  Mild bulge.  Mild spinal stenosis.   L4-5:  Status post fusion.  Lower pedicle screws traverse through the anterior margin of the L5 cortex approaching the posterior aspect or the common iliac vessels.  Interbody fusion device in place with anterior erosion and osteophyte formation.  This is probably degenerative in origin and infection would only need to be considered if high clinical concern.  Mild bilateral foraminal narrowing without nerve root compression.  No significant spinal stenosis.   L5-S1:  Mild to moderate bulge with mild bilateral facet degenerative changes.  IMPRESSION:  Postop changes L4-5 level.  No significant disc herniation or nerve root compression.  Filling defect within the right aspect of the thecal sac just below the L2-3 level.  Etiology indeterminate.  MR imaging with contrast and attention to this region recommended.  Please see above discussion.    CT cervical spine wo contrast (01/24/2007): Impression:  1. Negative for fracture or other acute bone injury.  2. Multilevel   cervical spondylitic changes.   Assessment/Plan:  This is George Dyer, a 82 y.o. male with: ***   Plan: ***  Return to clinic in ***  Total time spent reviewing records, interview, history/exam, documentation, and coordination of care on day of encounter:  ***  min  Kai Levins, MD

## 2022-08-03 ENCOUNTER — Encounter: Payer: Medicare HMO | Admitting: Physical Medicine and Rehabilitation

## 2022-08-03 ENCOUNTER — Encounter: Payer: Self-pay | Admitting: Physical Medicine and Rehabilitation

## 2022-08-03 VITALS — BP 143/76 | HR 81 | Ht 69.0 in | Wt 150.0 lb

## 2022-08-03 DIAGNOSIS — G629 Polyneuropathy, unspecified: Secondary | ICD-10-CM | POA: Diagnosis not present

## 2022-08-03 DIAGNOSIS — G4701 Insomnia due to medical condition: Secondary | ICD-10-CM

## 2022-08-03 DIAGNOSIS — R7303 Prediabetes: Secondary | ICD-10-CM

## 2022-08-03 DIAGNOSIS — R7301 Impaired fasting glucose: Secondary | ICD-10-CM | POA: Diagnosis not present

## 2022-08-03 MED ORDER — CAPSAICIN-CLEANSING GEL 8 % EX KIT
2.0000 | PACK | Freq: Once | CUTANEOUS | Status: AC
  Administered 2022-08-03: 2 via TOPICAL

## 2022-08-03 NOTE — Progress Notes (Signed)
Subjective:    Patient ID: George Dyer, male    DOB: November 05, 1940, 82 y.o.   MRN: [id number]  HPIMr. Savarino is an 82 year old man who presents for follow-up for 25 years of neuropathy  1) Pain in bilateral feet -started 25 years ago -he was not aware he had prediabetes but HgbA1c returned as 5.7 -did not tolerate cymbalta or gabapentin well in the past -worsened in the past year -he takes tramadol and it gives him 3 hours of relief- sometimes it stops the pain -pain makes him worse at night -tylenol does not help.  -metformin helps with his neuropathy  2) Cancer -saw Dr. Janice Coffin on Friday.   3) Insomnia -unable to sleep at night.  -tried gabapentin in the past but it made him so sleepy -he stopped taking tylenol   Pain Inventory Average Pain 10 Pain Right Now 4 My pain is constant, sharp, and burning  What TIME of day is your pain at its worst? night Sleep (in general) Poor  Pain is worse with: walking and unsure Pain improves with: medication Relief from Meds:  tramadol helps some  Walks with out assistance Climbs steps Drives    Family History  Problem Relation Age of Onset   Hypertension Mother    Heart attack Father    Heart disease Father    Cancer Maternal Aunt        breast   Esophageal cancer Maternal Uncle    Cancer Maternal Uncle    Colon cancer Neg Hx    Stomach cancer Neg Hx    Rectal cancer Neg Hx    Social History   Socioeconomic History   Marital status: Married    Spouse name: Not on file   Number of children: 3   Years of education: Not on file   Highest education level: Not on file  Occupational History   Occupation: self employed  Tobacco Use   Smoking status: Former    Packs/day: 0.25    Years: 6.00    Total pack years: 1.50    Types: Cigarettes    Quit date: 03/25/1981    Years since quitting: 41.3   Smokeless tobacco: Never  Vaping Use   Vaping Use: Never used  Substance and Sexual Activity   Alcohol use: No    Drug use: No   Sexual activity: Not Currently  Other Topics Concern   Not on file  Social History Narrative   Are you right handed or left handed? right   Are you currently employed ? retired   What is your current occupation?   Do you live at home alone? With wife   Caffeine 1-2 cups daily   What type of home do you live in: 1 story or 2 story? one       Social Determinants of Health   Financial Resource Strain: Not on file  Food Insecurity: Not on file  Transportation Needs: Not on file  Physical Activity: Not on file  Stress: Not on file  Social Connections: Not on file   Past Surgical History:  Procedure Laterality Date   BIOPSY  08/13/2021   Procedure: BIOPSY;  Surgeon: Rush Landmark, Telford Nab., MD;  Location: [facility] ENDOSCOPY;  Service: Gastroenterology;;   BREAST BIOPSY     RIGHT BREAST   CARDIAC SURGERY  1970's   wife wasn't sure what type of heart surgery.   COLONOSCOPY  08/06/2021   [name]   ESOPHAGOGASTRODUODENOSCOPY (EGD) WITH PROPOFOL N/A 08/13/2021  Procedure: ESOPHAGOGASTRODUODENOSCOPY (EGD) WITH PROPOFOL;  Surgeon: Rush Landmark Telford Nab., MD;  Location: [place];  Service: Gastroenterology;  Laterality: N/A;   EUS N/A 08/13/2021   Procedure: UPPER ENDOSCOPIC ULTRASOUND (EUS) RADIAL;  Surgeon: Irving Copas., MD;  Location: [place];  Service: Gastroenterology;  Laterality: N/A;   INGUINAL HERNIA REPAIR  07/10/2012   Procedure: LAPAROSCOPIC INGUINAL HERNIA;  Surgeon: Ralene Ok, MD;  Location: [place];  Service: General;  Laterality: Right;   INGUINAL HERNIA REPAIR  07/10/2012   [facility]   INSERTION OF MESH  07/10/2012   Procedure: INSERTION OF MESH;  Surgeon: Ralene Ok, MD;  Location: [place];  Service: General;  Laterality: Right;   IR PATIENT EVAL TECH 0-60 MINS  11/26/2021   LUMBAR DISC SURGERY     ORIF HUMERUS FRACTURE Left 01/13/2016   Procedure: OPEN REDUCTION INTERNAL FIXATION (ORIF) LEFT PROXIMAL HUMERUS FRACTURE;  Surgeon: Roseanne Kaufman, MD;  Location: [place];  Service: Orthopedics;  Laterality: Left;   TONSILLECTOMY     US ECHOCARDIOGRAPHY  04/20/1993   EF 65%   Past Surgical History:  Procedure Laterality Date   BIOPSY  08/13/2021   Procedure: BIOPSY;  Surgeon: Rush Landmark Telford Nab., MD;  Location: [place];  Service: Gastroenterology;;   BREAST BIOPSY     RIGHT BREAST   CARDIAC SURGERY  1970's   wife wasn't sure what type of heart surgery.   COLONOSCOPY  08/06/2021   [name]   ESOPHAGOGASTRODUODENOSCOPY (EGD) WITH PROPOFOL N/A 08/13/2021   Procedure: ESOPHAGOGASTRODUODENOSCOPY (EGD) WITH PROPOFOL;  Surgeon: Irving Copas., MD;  Location: [place];  Service: Gastroenterology;  Laterality: N/A;   EUS N/A 08/13/2021   Procedure: UPPER ENDOSCOPIC ULTRASOUND (EUS) RADIAL;  Surgeon: Irving Copas., MD;  Location: [place];  Service: Gastroenterology;  Laterality: N/A;   INGUINAL HERNIA REPAIR  07/10/2012   Procedure: LAPAROSCOPIC INGUINAL HERNIA;  Surgeon: Ralene Ok, MD;  Location: [place];  Service: General;  Laterality: Right;   INGUINAL HERNIA REPAIR  07/10/2012   [facility]   INSERTION OF MESH  07/10/2012   Procedure: INSERTION OF MESH;  Surgeon: Ralene Ok, MD;  Location: [place];  Service: General;  Laterality: Right;   IR PATIENT EVAL TECH 0-60 MINS  11/26/2021   LUMBAR DISC SURGERY     ORIF HUMERUS FRACTURE Left 01/13/2016   Procedure: OPEN REDUCTION INTERNAL FIXATION (ORIF) LEFT PROXIMAL HUMERUS FRACTURE;  Surgeon: Roseanne Kaufman, MD;  Location: [place];  Service: Orthopedics;  Laterality: Left;   TONSILLECTOMY     US ECHOCARDIOGRAPHY  04/20/1993   EF 65%   Past Medical History:  Diagnosis Date   Anxiety    Arthritis    Constipation    ED (erectile dysfunction)    GERD (gastroesophageal reflux disease)    Goals of care, counseling/discussion 08/18/2021   Headache    History of radiation therapy    Stomach- 12/09/21-01/12/22-Dr. Gery Pray   Hypertension    Kidney  stone    Lumbar disc disease    Pinched nerve    Pneumonia    Primary cancer of cardia of stomach ([place]) 08/18/2021   SVC syndrome    SECONDARY TO CHRONIC FIBROSING MEDIASTINITIS   Thyroid disease    Ht '5\' 9"'$  (1.753 m)   Wt 150 lb (68 kg)   BMI 22.15 kg/m   Opioid Risk Score:   Fall Risk Score:  `1  Depression screen [facility] 2/9     07/19/2022    9:28 AM  Depression screen [facility] 2/9  Decreased Interest 3  Down, Depressed, Hopeless 3  PHQ - 2 Score 6  Altered sleeping 3  Tired, decreased energy 3  Change in appetite 3  Feeling bad or failure about yourself  0  Trouble concentrating 0  Moving slowly or fidgety/restless 0  Suicidal thoughts 0  PHQ-9 Score 15    Review of Systems  Constitutional:        Weight loss  Gastrointestinal:  Positive for constipation.  Musculoskeletal:        Pain in both legs from the the knees down  All other systems reviewed and are negative.      Objective:   Physical Exam  Gen: no distress, normal appearing, weight 150 lbs, BMI 22.15, BP 143/76 HEENT: oral mucosa pink and moist, NCAT Cardio: Reg rate Chest: normal effort, normal rate of breathing Abd: soft, non-distended Ext: no edema Psych: pleasant, normal affect Skin: intact, no open lesions on feet, sensation intact Neuro: Alert and oriented x3      Assessment & Plan:   1) Chronic Pain Syndrome secondary to neuropathy -Discussed current symptoms of pain and history of pain.  -discussed that prediabetes could be a contributory factor -continue metformin '500mg'$  daily -discussed gabapentin  Recommend topical CBD oil- discussed its benefits in reducing inflammation, pain, insomnia, and anxiety.  -Discussed that CBD oil differs from marijuana in that it does not contain THC- the substance that causes euphoria.  -Discussed that it is made from the hemp plant.  -It has been used for thousands of years -preliminary research suggests that if may be able to shrink cancerous tumors,  stop plaque formation in Alzheimer's Disease, and slow the progress of brain disease from concussions.  -Additional benefits that have been demonstrated in studies include improved nausea, indigestion, and brain health, and reduced seizures.  -In a survey 92% of patients who tried medical cannabis felt it improved symptoms such as chronic pain, arthritis, migraines, and cancer.    -Discussed benefits of exercise in reducing pain. -Discussed following foods that may reduce pain: 1) Ginger (especially studied for arthritis)- reduce leukotriene production to decrease inflammation 2) Blueberries- high in phytonutrients that decrease inflammation 3) Salmon- marine omega-3s reduce joint swelling and pain 4) Pumpkin seeds- reduce inflammation 5) dark chocolate- reduces inflammation 6) turmeric- reduces inflammation 7) tart cherries - reduce pain and stiffness 8) extra virgin olive oil - its compound olecanthal helps to block prostaglandins  9) chili peppers- can be eaten or applied topically via capsaicin 10) mint- helpful for headache, muscle aches, joint pain, and itching 11) garlic- reduces inflammation  Link to further information on diet for chronic pain: http://www.randall.com/   -Discussed Qutenza as an option for neuropathic pain control. Discussed that this is a capsaicin patch, stronger than capsaicin cream. Discussed that it is currently approved for diabetic peripheral neuropathy and post-herpetic neuralgia, but that it has also shown benefit in treating other forms of neuropathy. Provided patient with link to site to learn more about the patch: CinemaBonus.fr. Discussed that the patch would be placed in office and benefits usually last 3 months. Discussed that unintended exposure to capsaicin can cause severe irritation of eyes, mucous membranes, respiratory tract, and skin, but that Qutenza is a local treatment  and does not have the systemic side effects of other nerve medications. Discussed that there may be pain, itching, erythema, and decreased sensory function associated with the application of Qutenza. Side effects usually subside within 1 week. A cold pack of analgesic medications can help with  these side effects. Blood pressure can also be increased due to pain associated with administration of the patch.   2 patches of Qutenza was applied to the area of pain. Ice packs were applied during the procedure to ensure patient comfort. Blood pressure was monitored every 15 minutes. The patient tolerated the procedure well. Post-procedure instructions were given and follow-up has been scheduled.    2) Prediabetes -hgbA1c reviewed and is 5.7 -repeat HgbA1c in 3 months  3) Insomnia: -Try to go outside near sunrise -Get exercise during the day.  -Turn off all devices an hour before bedtime.  -Teas that can benefit: chamomile, valerian root, Brahmi (Bacopa) -Can consider over the counter melatonin, magnesium, and/or L-theanine. Melatonin is an anti-oxidant with multiple health benefits. Magnesium is involved in greater than 300 enzymatic reactions in the body and most of [place] are deficient as our soil is often depleted. There are 7 different types of magnesium- Bioptemizer's is a supplement with all 7 types, and each has unique benefits. Magnesium can also help with constipation and anxiety.  -Pistachios naturally increase the production of melatonin -Cozy Earth bamboo bed sheets are free from toxic chemicals.  -Tart cherry juice or a tart cherry supplement can improve sleep and soreness post-workout   >40 minutes spent in discussion of risks and benefits of Qutenza and obtaining informed consent, discussion of q90 day follow-up and expectation of improvement in pain with each repeat application, discussed that her tolerated Qutenza treatment very well today, that he may start feeling benefit in 2 weeks,  discussed his response to metformin, that we can repeat his HgbA1c next visit, discussed risks and benefits of gabapentin, discussed 20% responsibility for the treatment, discussed with staff sending to speciality pharmacy next time

## 2022-08-03 NOTE — Patient Instructions (Signed)
Insomnia: -Try to go outside near sunrise -Get exercise during the day.  -Turn off all devices an hour before bedtime.  -Teas that can benefit: chamomile, valerian root, Brahmi (Bacopa) -Can consider over the counter melatonin, magnesium, and/or L-theanine. Melatonin is an anti-oxidant with multiple health benefits. Magnesium is involved in greater than 300 enzymatic reactions in the body and most of us are deficient as our soil is often depleted. There are 7 different types of magnesium- Bioptemizer's is a supplement with all 7 types, and each has unique benefits. Magnesium can also help with constipation and anxiety.  -Pistachios naturally increase the production of melatonin -Cozy Earth bamboo bed sheets are free from toxic chemicals.  -Tart cherry juice or a tart cherry supplement can improve sleep and soreness post-workout  Foods that may reduce pain: 1) Ginger (especially studied for arthritis)- reduce leukotriene production to decrease inflammation 2) Blueberries- high in phytonutrients that decrease inflammation 3) Salmon- marine omega-3s reduce joint swelling and pain 4) Pumpkin seeds- reduce inflammation 5) dark chocolate- reduces inflammation 6) turmeric- reduces inflammation 7) tart cherries - reduce pain and stiffness 8) extra virgin olive oil - its compound olecanthal helps to block prostaglandins  9) chili peppers- can be eaten or applied topically via capsaicin 10) mint- helpful for headache, muscle aches, joint pain, and itching 11) garlic- reduces inflammation  Link to further information on diet for chronic pain: https://www.practicalpainmanagement.com/treatments/complementary/diet-patients-chronic-pain  

## 2022-08-11 ENCOUNTER — Ambulatory Visit: Payer: Medicare HMO | Admitting: Neurology

## 2022-08-13 ENCOUNTER — Other Ambulatory Visit: Payer: Self-pay | Admitting: *Deleted

## 2022-08-13 MED ORDER — DRONABINOL 2.5 MG PO CAPS: 2.5000 mg | ORAL_CAPSULE | Freq: Two times a day (BID) | ORAL | 0 refills | Status: DC

## 2022-08-16 ENCOUNTER — Other Ambulatory Visit: Payer: Self-pay | Admitting: Hematology & Oncology

## 2022-08-16 ENCOUNTER — Ambulatory Visit: Payer: Medicare HMO | Admitting: Hematology & Oncology

## 2022-08-16 ENCOUNTER — Other Ambulatory Visit: Payer: Medicare HMO

## 2022-08-16 ENCOUNTER — Encounter: Payer: Self-pay | Admitting: *Deleted

## 2022-08-16 DIAGNOSIS — C160 Malignant neoplasm of cardia: Secondary | ICD-10-CM

## 2022-08-17 ENCOUNTER — Other Ambulatory Visit: Payer: Self-pay

## 2022-08-17 MED ORDER — TEMAZEPAM 7.5 MG PO CAPS: 7.5000 mg | ORAL_CAPSULE | Freq: Every evening | ORAL | 0 refills | Status: DC | PRN

## 2022-08-31 ENCOUNTER — Telehealth: Payer: Self-pay

## 2022-08-31 DIAGNOSIS — G629 Polyneuropathy, unspecified: Secondary | ICD-10-CM

## 2022-08-31 MED ORDER — QUTENZA (4 PATCH) 8 % EX KIT: 4.0000 | PACK | Freq: Once | CUTANEOUS | 0 refills | Status: DC

## 2022-08-31 NOTE — Telephone Encounter (Signed)
Sent Rx for Qutenza patches to pharmacy to see if they can be covered under members pharmacy benefits. PA submitted for patches

## 2022-09-05 ENCOUNTER — Other Ambulatory Visit: Payer: Self-pay | Admitting: Gastroenterology

## 2022-09-06 ENCOUNTER — Encounter: Payer: Self-pay | Admitting: Hematology & Oncology

## 2022-09-06 ENCOUNTER — Other Ambulatory Visit: Payer: Self-pay

## 2022-09-06 ENCOUNTER — Inpatient Hospital Stay: Payer: Medicare HMO | Admitting: Hematology & Oncology

## 2022-09-06 ENCOUNTER — Inpatient Hospital Stay: Payer: Medicare HMO | Attending: Hematology & Oncology

## 2022-09-06 VITALS — BP 133/66 | HR 85 | Temp 99.7°F | Resp 19 | Ht 69.0 in | Wt 145.0 lb

## 2022-09-06 DIAGNOSIS — G629 Polyneuropathy, unspecified: Secondary | ICD-10-CM | POA: Diagnosis not present

## 2022-09-06 DIAGNOSIS — Z7984 Long term (current) use of oral hypoglycemic drugs: Secondary | ICD-10-CM | POA: Insufficient documentation

## 2022-09-06 DIAGNOSIS — C169 Malignant neoplasm of stomach, unspecified: Secondary | ICD-10-CM | POA: Diagnosis not present

## 2022-09-06 DIAGNOSIS — C160 Malignant neoplasm of cardia: Secondary | ICD-10-CM | POA: Diagnosis not present

## 2022-09-06 LAB — CBC WITH DIFFERENTIAL (CANCER CENTER ONLY)
Abs Immature Granulocytes: 0.01 10*3/uL (ref 0.00–0.07)
Basophils Absolute: 0.1 10*3/uL (ref 0.0–0.1)
Basophils Relative: 1 %
Eosinophils Absolute: 0.2 10*3/uL (ref 0.0–0.5)
Eosinophils Relative: 3 %
HCT: 41 % (ref 39.0–52.0)
Hemoglobin: 13.1 g/dL (ref 13.0–17.0)
Immature Granulocytes: 0 %
Lymphocytes Relative: 19 %
Lymphs Abs: 1.1 10*3/uL (ref 0.7–4.0)
MCH: 28.3 pg (ref 26.0–34.0)
MCHC: 32 g/dL (ref 30.0–36.0)
MCV: 88.6 fL (ref 80.0–100.0)
Monocytes Absolute: 0.5 10*3/uL (ref 0.1–1.0)
Monocytes Relative: 10 %
Neutro Abs: 3.7 10*3/uL (ref 1.7–7.7)
Neutrophils Relative %: 67 %
Platelet Count: 296 10*3/uL (ref 150–400)
RBC: 4.63 MIL/uL (ref 4.22–5.81)
RDW: 13.3 % (ref 11.5–15.5)
WBC Count: 5.5 10*3/uL (ref 4.0–10.5)
nRBC: 0 % (ref 0.0–0.2)

## 2022-09-06 LAB — IRON AND IRON BINDING CAPACITY (CC-WL,HP ONLY)
Iron: 109 ug/dL (ref 45–182)
Saturation Ratios: 36 % (ref 17.9–39.5)
TIBC: 301 ug/dL (ref 250–450)
UIBC: 192 ug/dL (ref 117–376)

## 2022-09-06 LAB — CMP (CANCER CENTER ONLY)
ALT: 14 U/L (ref 0–44)
AST: 14 U/L — ABNORMAL LOW (ref 15–41)
Albumin: 4.2 g/dL (ref 3.5–5.0)
Alkaline Phosphatase: 89 U/L (ref 38–126)
Anion gap: 7 (ref 5–15)
BUN: 19 mg/dL (ref 8–23)
CO2: 29 mmol/L (ref 22–32)
Calcium: 9.4 mg/dL (ref 8.9–10.3)
Chloride: 101 mmol/L (ref 98–111)
Creatinine: 1.09 mg/dL (ref 0.61–1.24)
GFR, Estimated: >60 mL/min (ref >60)
Glucose, Bld: 130 mg/dL — ABNORMAL HIGH (ref 70–99)
Potassium: 4.3 mmol/L (ref 3.5–5.1)
Sodium: 137 mmol/L (ref 135–145)
Total Bilirubin: 0.4 mg/dL (ref 0.3–1.2)
Total Protein: 6.3 g/dL — ABNORMAL LOW (ref 6.5–8.1)

## 2022-09-06 LAB — TSH: TSH: 5.216 u[IU]/mL — ABNORMAL HIGH (ref 0.350–4.500)

## 2022-09-06 LAB — FERRITIN: Ferritin: 66 ng/mL (ref 24–336)

## 2022-09-06 MED ORDER — MORPHINE SULFATE 15 MG PO TABS: 15.0000 mg | ORAL_TABLET | Freq: Four times a day (QID) | ORAL | 0 refills | Status: DC | PRN

## 2022-09-06 NOTE — Progress Notes (Signed)
Ma'am yes no surgery yes ma'am good Hematology and Oncology Follow Up Visit  KHYSON FRANCAVILLA [id number] 1941/02/18 82 y.o. 09/06/2022   Principle Diagnosis:  Stage IIB (T3N0M0) Gastric cancer -- declines surgery  Current Therapy:   FOLFOX - s/p cycle #4-  start on 09/15/2021 XRT/Xeloda -- start on 12/07/2021 --completed on 01/12/2022     Interim History:  Mr. Nessmith is back for follow-up.  His appetite is still a real problem.  I am not sure if he is taking the Marinol.  I gave this to him last time that he was here.  He is also worried about the pain that he has.  He has horrible neuropathy in his legs.  He does see a pain specialist.  He is on tramadol but he says this does not help.  He is taking some Cymbalta.  Again whether or not he is taking this remains to be seen.  He is losing weight.  Again I worry that the cancer has recurred.  We will check this out when he has a PET scan in about 3 weeks.   Currently, his main problem has been this neuropathy that he has.  He has had this for several years.  He he also has a little constipation.  He is on MiraLAX.  I told him to take MiraLAX twice a day.  If he needs a stool softener, he can always take this.  He has had no bleeding.  He has had no cough or shortness of breath.  He has had no mouth sores.  There has been a little bit of a headache.  Overall, I would say his performance status is probably ECOG 1-2.     Medications:  Current Outpatient Medications:    acetaminophen (TYLENOL) 325 MG tablet, Take 650 mg by mouth 2 (two) times daily as needed., Disp: , Rfl:    alum & mag hydroxide-simeth (MAALOX/MYLANTA) 200-200-20 MG/5ML suspension, Take 30 mLs by mouth daily., Disp: , Rfl:    [START ON 11/02/2022] capsaicin topical system (QUTENZA, 4 PATCH,) 8 %, Apply 4 patches topically once for 1 dose. Placed on affected area of skin every 3 months as needed for neuropathy., Disp: 4 patch, Rfl: 0   Chlorhexidine Gluconate 2 % PADS, ,  Disp: , Rfl:    dexamethasone (DECADRON) 4 MG tablet, , Disp: , Rfl:    dronabinol (MARINOL) 2.5 MG capsule, Take 1 capsule (2.5 mg total) by mouth 2 (two) times daily before a meal., Disp: 60 capsule, Rfl: 0   DULoxetine (CYMBALTA) 30 MG capsule, Take 2 capsules (60 mg total) by mouth at bedtime. Take 1 capsule (30 mg) for the 1st week, then increase to 2 capsules thereafter., Disp: 60 capsule, Rfl: 3   levothyroxine (SYNTHROID) 75 MCG tablet, Take 1 tablet (75 mcg total) by mouth daily before breakfast., Disp: 30 tablet, Rfl: 4   lisinopril-hydrochlorothiazide (ZESTORETIC) 20-12.5 MG tablet, Take 1 tablet by mouth daily., Disp: 90 tablet, Rfl: 3   metFORMIN (GLUCOPHAGE) 500 MG tablet, Take 1 tablet (500 mg total) by mouth daily with breakfast., Disp: 90 tablet, Rfl: 3   omeprazole (PRILOSEC) 40 MG capsule, TAKE 1 CAPSULE (40 MG TOTAL) BY MOUTH DAILY., Disp: 90 capsule, Rfl: 1   ondansetron (ZOFRAN) 8 MG tablet, Take 1 tablet (8 mg total) by mouth every 8 (eight) hours as needed for nausea or vomiting., Disp: 20 tablet, Rfl: 0   polyethylene glycol (MIRALAX / GLYCOLAX) 17 g packet, Take 17 g by mouth daily.,  Disp: , Rfl:    prochlorperazine (COMPAZINE) 10 MG tablet, Take 1 tablet (10 mg total) by mouth every 6 (six) hours as needed (Nausea or vomiting)., Disp: 30 tablet, Rfl: 1   temazepam (RESTORIL) 7.5 MG capsule, Take 1 capsule (7.5 mg total) by mouth at bedtime as needed for sleep., Disp: 30 capsule, Rfl: 0   traMADol (ULTRAM) 50 MG tablet, Take 50 mg by mouth every 6 (six) hours as needed., Disp: , Rfl:  No current facility-administered medications for this visit.  Facility-Administered Medications Ordered in Other Visits:    heparin lock flush 100 unit/mL, 500 Units, Intravenous, Once, Leatha Rohner, Rudell Cobb, MD   HYDROcodone-acetaminophen (NORCO/VICODIN) 5-325 MG per tablet 1 tablet, 1 tablet, Oral, Once, Celso Amy, NP   sodium chloride flush (NS) 0.9 % injection 10 mL, 10 mL, Intravenous,  Once, Hiroshi Krummel, Rudell Cobb, MD  Allergies:  Allergies  Allergen Reactions   Percocet [Oxycodone-Acetaminophen] Nausea And Vomiting   Oxycodone Other (See Comments)   Oxycodone-Aspirin Nausea And Vomiting    Past Medical History, Surgical history, Social history, and Family History were reviewed and updated.  Review of Systems: Review of Systems  Constitutional:  Positive for fatigue.  HENT:  Negative.    Eyes: Negative.   Respiratory: Negative.    Cardiovascular: Negative.   Gastrointestinal:  Positive for nausea.  Endocrine: Negative.   Genitourinary: Negative.    Musculoskeletal: Negative.   Skin: Negative.   Neurological: Negative.   Hematological: Negative.   Psychiatric/Behavioral: Negative.      Physical Exam:  height is 5\' 9"  (1.753 m) and weight is 145 lb (65.8 kg). His oral temperature is 99.7 F (37.6 C). His blood pressure is 133/66 and his pulse is 85. His respiration is 19 and oxygen saturation is 98%.   Wt Readings from Last 3 Encounters:  09/06/22 145 lb (65.8 kg)  08/03/22 150 lb (68 kg)  07/19/22 150 lb (68 kg)    Physical Exam Vitals reviewed.  HENT:     Head: Normocephalic and atraumatic.  Eyes:     Pupils: Pupils are equal, round, and reactive to light.  Cardiovascular:     Rate and Rhythm: Normal rate and regular rhythm.     Heart sounds: Normal heart sounds.  Pulmonary:     Effort: Pulmonary effort is normal.     Breath sounds: Normal breath sounds.  Abdominal:     General: Bowel sounds are normal.     Palpations: Abdomen is soft.  Musculoskeletal:        General: No tenderness or deformity. Normal range of motion.     Cervical back: Normal range of motion.  Lymphadenopathy:     Cervical: No cervical adenopathy.  Skin:    General: Skin is warm and dry.     Findings: No erythema or rash.  Neurological:     Mental Status: He is alert and oriented to person, place, and time.  Psychiatric:        Behavior: Behavior normal.        Thought  Content: Thought content normal.        Judgment: Judgment normal.   Lab Results  Component Value Date   WBC 5.5 09/06/2022   HGB 13.1 09/06/2022   HCT 41.0 09/06/2022   MCV 88.6 09/06/2022   PLT 296 09/06/2022     Chemistry      Component Value Date/Time   NA 137 09/06/2022 1137   NA 139 07/23/2020 0818   K 4.3 09/06/2022  1137   CL 101 09/06/2022 1137   CO2 29 09/06/2022 1137   BUN 19 09/06/2022 1137   BUN 10 07/23/2020 0818   CREATININE 1.09 09/06/2022 1137   CREATININE 1.06 06/27/2015 0837      Component Value Date/Time   CALCIUM 9.4 09/06/2022 1137   ALKPHOS 89 09/06/2022 1137   AST 14 (L) 09/06/2022 1137   ALT 14 09/06/2022 1137   BILITOT 0.4 09/06/2022 1137      Impression and Plan: Mr. Makovec is a very nice 82 year old white male.  He has what I hope to believe is localized gastric cancer.   He has had 4 cycles of chemotherapy.  By the follow-up scans and endoscopy, he really did not have much of a response.  We subsequently gave him radiation with chemotherapy.  He had low-dose Xeloda.  He has residual disease.  This was biopsy-proven.  He has not wanted any treatment for this.  We will set him up with a PET scan about 3 weeks.  I forgot to mention that he is taking this nodule T.  He says it got him up to him at work.  He got some leaves from a tree.  History is from [place] or [place].  He does not know the name of the tree.  I told her to find this out.  He is making a tea out of the leaves.  He drinks this daily.  I do not have a problem with him doing this.  I would like to plan to get him back after his PET scan.  Will then have to see what this shows and whether or not there is recurrent disease.   Volanda Napoleon, MD 4/1/202412:19 PM

## 2022-09-07 ENCOUNTER — Other Ambulatory Visit: Payer: Self-pay

## 2022-09-07 ENCOUNTER — Other Ambulatory Visit: Payer: Self-pay | Admitting: Hematology & Oncology

## 2022-09-07 LAB — TESTOSTERONE: Testosterone: 732 ng/dL (ref 264–916)

## 2022-09-07 MED ORDER — MEGESTROL ACETATE 625 MG/5ML PO SUSP: 1250.0000 mg | Freq: Every day | ORAL | 0 refills | Status: DC

## 2022-09-08 ENCOUNTER — Other Ambulatory Visit: Payer: Self-pay | Admitting: Hematology & Oncology

## 2022-09-09 ENCOUNTER — Encounter: Payer: Self-pay | Admitting: Hematology & Oncology

## 2022-09-13 ENCOUNTER — Other Ambulatory Visit: Payer: Self-pay

## 2022-09-13 DIAGNOSIS — C160 Malignant neoplasm of cardia: Secondary | ICD-10-CM

## 2022-09-13 MED ORDER — MEGESTROL ACETATE 400 MG/10ML PO SUSP: 400.0000 mg | Freq: Two times a day (BID) | ORAL | 0 refills | Status: DC

## 2022-09-15 ENCOUNTER — Other Ambulatory Visit: Payer: Self-pay | Admitting: Hematology & Oncology

## 2022-09-15 ENCOUNTER — Telehealth: Payer: Self-pay | Admitting: *Deleted

## 2022-09-15 DIAGNOSIS — C160 Malignant neoplasm of cardia: Secondary | ICD-10-CM

## 2022-09-15 DIAGNOSIS — I871 Compression of vein: Secondary | ICD-10-CM

## 2022-09-15 MED ORDER — TEMAZEPAM 7.5 MG PO CAPS
7.5000 mg | ORAL_CAPSULE | Freq: Every evening | ORAL | 0 refills | Status: DC | PRN
Start: 2022-09-15 — End: 2022-10-17

## 2022-09-15 NOTE — Telephone Encounter (Addendum)
[facility] speciality pharm calling to inform that they have been unable to contact the patient. Qutenza has been approved patient portion $11.20. They need to speak with him to obtain a credit card on file and confirm shipment. Call back # [phone]

## 2022-09-15 NOTE — Telephone Encounter (Signed)
Patient has decided not to proceed further treatment of Qutenza.

## 2022-09-15 NOTE — Telephone Encounter (Signed)
Returned call to [employer] and notified. Nothing further needed at this time.

## 2022-09-15 NOTE — Telephone Encounter (Signed)
Last refilled 08/17/22 # 30. Please advise for refills, thanks!

## 2022-09-27 ENCOUNTER — Other Ambulatory Visit: Payer: Self-pay | Admitting: Hematology & Oncology

## 2022-09-27 DIAGNOSIS — C160 Malignant neoplasm of cardia: Secondary | ICD-10-CM

## 2022-09-27 MED ORDER — MORPHINE SULFATE 15 MG PO TABS
15.0000 mg | ORAL_TABLET | Freq: Four times a day (QID) | ORAL | 0 refills | Status: DC | PRN
Start: 2022-09-27 — End: 2022-10-26

## 2022-09-27 NOTE — Telephone Encounter (Signed)
Last refilled 09/06/22 #90 1 tab q6h prn pain. Please advise, thanks! Changed quantity to a 90 day supply.

## 2022-10-01 ENCOUNTER — Encounter (HOSPITAL_COMMUNITY): Payer: Self-pay

## 2022-10-01 ENCOUNTER — Encounter (HOSPITAL_COMMUNITY)
Admission: RE | Admit: 2022-10-01 | Discharge: 2022-10-01 | Disposition: A | Discharge status: Home / Self Care | Payer: Medicare HMO | Source: Ambulatory Visit | Attending: Hematology & Oncology | Admitting: Hematology & Oncology

## 2022-10-01 DIAGNOSIS — C160 Malignant neoplasm of cardia: Secondary | ICD-10-CM | POA: Insufficient documentation

## 2022-10-01 MED ORDER — FLUDEOXYGLUCOSE F - 18 (FDG) INJECTION
7.3000 | Freq: Once | INTRAVENOUS | Status: AC
  Administered 2022-10-01: 7.3 via INTRAVENOUS

## 2022-10-04 ENCOUNTER — Other Ambulatory Visit: Payer: Self-pay

## 2022-10-04 ENCOUNTER — Encounter: Payer: Self-pay | Admitting: Hematology & Oncology

## 2022-10-04 ENCOUNTER — Inpatient Hospital Stay (HOSPITAL_BASED_OUTPATIENT_CLINIC_OR_DEPARTMENT_OTHER): Payer: Medicare HMO | Admitting: Hematology & Oncology

## 2022-10-04 ENCOUNTER — Ambulatory Visit (HOSPITAL_COMMUNITY)
Admission: RE | Admit: 2022-10-04 | Discharge: 2022-10-04 | Disposition: A | Discharge status: Home / Self Care | Payer: Medicare HMO | Source: Ambulatory Visit | Attending: Hematology & Oncology | Admitting: Hematology & Oncology

## 2022-10-04 ENCOUNTER — Inpatient Hospital Stay: Payer: Medicare HMO

## 2022-10-04 VITALS — BP 123/53 | HR 81 | Temp 98.6°F | Resp 18 | Ht 69.0 in | Wt 145.0 lb

## 2022-10-04 DIAGNOSIS — C160 Malignant neoplasm of cardia: Secondary | ICD-10-CM

## 2022-10-04 DIAGNOSIS — I871 Compression of vein: Secondary | ICD-10-CM | POA: Insufficient documentation

## 2022-10-04 DIAGNOSIS — K219 Gastro-esophageal reflux disease without esophagitis: Secondary | ICD-10-CM | POA: Insufficient documentation

## 2022-10-04 DIAGNOSIS — Z8521 Personal history of malignant neoplasm of larynx: Secondary | ICD-10-CM | POA: Diagnosis not present

## 2022-10-04 DIAGNOSIS — C169 Malignant neoplasm of stomach, unspecified: Secondary | ICD-10-CM | POA: Diagnosis not present

## 2022-10-04 DIAGNOSIS — Z7984 Long term (current) use of oral hypoglycemic drugs: Secondary | ICD-10-CM | POA: Diagnosis not present

## 2022-10-04 DIAGNOSIS — G629 Polyneuropathy, unspecified: Secondary | ICD-10-CM | POA: Diagnosis not present

## 2022-10-04 LAB — CMP (CANCER CENTER ONLY)
ALT: 14 U/L (ref 0–44)
AST: 14 U/L — ABNORMAL LOW (ref 15–41)
Albumin: 3.8 g/dL (ref 3.5–5.0)
Alkaline Phosphatase: 77 U/L (ref 38–126)
Anion gap: 7 (ref 5–15)
BUN: 22 mg/dL (ref 8–23)
CO2: 29 mmol/L (ref 22–32)
Calcium: 8.9 mg/dL (ref 8.9–10.3)
Chloride: 105 mmol/L (ref 98–111)
Creatinine: 1.07 mg/dL (ref 0.61–1.24)
GFR, Estimated: >60 mL/min (ref >60)
Glucose, Bld: 123 mg/dL — ABNORMAL HIGH (ref 70–99)
Potassium: 4.4 mmol/L (ref 3.5–5.1)
Sodium: 141 mmol/L (ref 135–145)
Total Bilirubin: 0.4 mg/dL (ref 0.3–1.2)
Total Protein: 6 g/dL — ABNORMAL LOW (ref 6.5–8.1)

## 2022-10-04 LAB — CBC WITH DIFFERENTIAL (CANCER CENTER ONLY)
Abs Immature Granulocytes: 0.01 10*3/uL (ref 0.00–0.07)
Basophils Absolute: 0.1 10*3/uL (ref 0.0–0.1)
Basophils Relative: 1 %
Eosinophils Absolute: 0.2 10*3/uL (ref 0.0–0.5)
Eosinophils Relative: 3 %
HCT: 37.4 % — ABNORMAL LOW (ref 39.0–52.0)
Hemoglobin: 11.9 g/dL — ABNORMAL LOW (ref 13.0–17.0)
Immature Granulocytes: 0 %
Lymphocytes Relative: 21 %
Lymphs Abs: 1.1 10*3/uL (ref 0.7–4.0)
MCH: 28.5 pg (ref 26.0–34.0)
MCHC: 31.8 g/dL (ref 30.0–36.0)
MCV: 89.7 fL (ref 80.0–100.0)
Monocytes Absolute: 0.5 10*3/uL (ref 0.1–1.0)
Monocytes Relative: 10 %
Neutro Abs: 3.2 10*3/uL (ref 1.7–7.7)
Neutrophils Relative %: 65 %
Platelet Count: 292 10*3/uL (ref 150–400)
RBC: 4.17 MIL/uL — ABNORMAL LOW (ref 4.22–5.81)
RDW: 13.8 % (ref 11.5–15.5)
WBC Count: 5 10*3/uL (ref 4.0–10.5)
nRBC: 0 % (ref 0.0–0.2)

## 2022-10-04 LAB — GLUCOSE, CAPILLARY: Glucose-Capillary: 100 mg/dL — ABNORMAL HIGH (ref 70–99)

## 2022-10-04 LAB — IRON AND IRON BINDING CAPACITY (CC-WL,HP ONLY)
Iron: 93 ug/dL (ref 45–182)
Saturation Ratios: 33 % (ref 17.9–39.5)
TIBC: 279 ug/dL (ref 250–450)
UIBC: 186 ug/dL (ref 117–376)

## 2022-10-04 LAB — FERRITIN: Ferritin: 74 ng/mL (ref 24–336)

## 2022-10-04 LAB — LACTATE DEHYDROGENASE: LDH: 99 U/L (ref 98–192)

## 2022-10-04 LAB — PREALBUMIN: Prealbumin: 23 mg/dL (ref 18–38)

## 2022-10-04 MED ORDER — FLUDEOXYGLUCOSE F - 18 (FDG) INJECTION
7.1400 | Freq: Once | INTRAVENOUS | Status: AC | PRN
  Administered 2022-10-04: 7.14 via INTRAVENOUS

## 2022-10-04 NOTE — Progress Notes (Signed)
DISCONTINUE ON PATHWAY REGIMEN - Gastroesophageal     A cycle is every 14 days:     Oxaliplatin      Leucovorin      Fluorouracil      Fluorouracil   **Always confirm dose/schedule in your pharmacy ordering system**  REASON: Disease Progression PRIOR TREATMENT: GEOS3: mFOLFOX6 q14 Days Until Progression or Unacceptable Toxicity TREATMENT RESPONSE: Partial Response (PR)  START OFF PATHWAY REGIMEN - Gastroesophageal   [id number] (Leucovorin IV D1 + Fluorouracil IV D1/CIV D1,2 + Irinotecan IV D1) q14 Days:   A cycle is every 14 days:     Irinotecan      Leucovorin      Fluorouracil      Fluorouracil   **Always confirm dose/schedule in your pharmacy ordering system**  Patient Characteristics: Distant Metastases (cM1/pM1) / Locally Recurrent Disease, Adenocarcinoma - Esophageal, GE Junction, and Gastric, Second Line, MSS/pMMR or MSI Unknown Therapeutic Status: Distant Metastases (No Additional Staging) Histology: Adenocarcinoma Disease Classification: Gastric Line of Therapy: Second Line Microsatellite/Mismatch Repair Status: MSS/pMMR Intent of Therapy: Non-Curative / Palliative Intent, Discussed with Patient

## 2022-10-04 NOTE — Progress Notes (Signed)
Ma'am yes no surgery yes ma'am good Hematology and Oncology Follow Up Visit  George Dyer [id number] 1940-12-17 82 y.o. 10/04/2022   Principle Diagnosis:  Stage IIB (T3N0M0) Gastric cancer -- declines surgery -recurrent-  HER2(-)/ BRAF (+)  Current Therapy:   FOLFOX - s/p cycle #4-  start on 09/15/2021 XRT/Xeloda -- start on 12/07/2021 --completed on 01/12/2022 FOLFIRI/Opdivo -- start cycle #1 on 10/19/2022     Interim History:  Mr. Stockhausen is back for follow-up.  Unfortunately, I suspect that his cancer is progressing.  He had a PET scan done today.  Even though the results not back yet, I looked at the PET scan.  He clearly had increased activity in his stomach.  I really cannot see any activity elsewhere.  He still does not wish to have any surgery.  He does not wish to lose his stomach.  As such, I think that the best option for this is going to be chemotherapy with immunotherapy.  I think this would be a reasonable option for him.  One of the things we have to be careful with that he has terrible neuropathy in his legs.  As such, I do not think we can utilize FOLFOX.  I probably would consider him for FOLFIRI along with Opdivo.  I would probably try to avoid a taxane given the fact that this does have neuropathic issues.  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  As such we will go ahead and get a PICC line into him.  I think he had a PICC line when he had his FOLFOX.  He still seem to be doing pretty well.  His weight I think is holding relatively stable.  He has had no dysphagia or odynophagia.  He has had no nausea or vomiting.  He has had no change in bowel or bladder habits.  He has had no leg swelling.  He has had no bleeding.  Overall, I would say his performance status is probably ECOG 1.    Medications:  Current Outpatient Medications:    acetaminophen (TYLENOL) 325 MG tablet, Take 650 mg by mouth 2  (two) times daily as needed., Disp: , Rfl:    alum & mag hydroxide-simeth (MAALOX/MYLANTA) 200-200-20 MG/5ML suspension, Take 30 mLs by mouth daily., Disp: , Rfl:    [START ON 11/02/2022] capsaicin topical system (QUTENZA, 4 PATCH,) 8 %, Apply 4 patches topically once for 1 dose. Placed on affected area of skin every 3 months as needed for neuropathy., Disp: 4 patch, Rfl: 0   Chlorhexidine Gluconate 2 % PADS, , Disp: , Rfl:    dexamethasone (DECADRON) 4 MG tablet, , Disp: , Rfl:    dronabinol (MARINOL) 2.5 MG capsule, Take 1 capsule (2.5 mg total) by mouth 2 (two) times daily before a meal., Disp: 60 capsule, Rfl: 0   DULoxetine (CYMBALTA) 30 MG capsule, Take 2 capsules (60 mg total) by mouth at bedtime. Take 1 capsule (30 mg) for the 1st week, then increase to 2 capsules thereafter., Disp: 60 capsule, Rfl: 3   levothyroxine (SYNTHROID) 75 MCG tablet, Take 1 tablet (75 mcg total) by mouth daily before breakfast., Disp: 30 tablet, Rfl: 4   lisinopril-hydrochlorothiazide (ZESTORETIC) 20-12.5 MG tablet, Take 1 tablet by mouth daily., Disp: 90 tablet, Rfl: 3   megestrol (MEGACE ES) 625 MG/5ML suspension, Take 5 mLs (625 mg total) by mouth daily., Disp: 150 mL, Rfl: 4   megestrol (MEGACE)  400 MG/10ML suspension, Take 10 mLs (400 mg total) by mouth 2 (two) times daily., Disp: 240 mL, Rfl: 0   metFORMIN (GLUCOPHAGE) 500 MG tablet, Take 1 tablet (500 mg total) by mouth daily with breakfast., Disp: 90 tablet, Rfl: 3   morphine (MSIR) 15 MG tablet, Take 1 tablet (15 mg total) by mouth every 6 (six) hours as needed for severe pain., Disp: 120 tablet, Rfl: 0   omeprazole (PRILOSEC) 40 MG capsule, TAKE 1 CAPSULE (40 MG TOTAL) BY MOUTH DAILY., Disp: 90 capsule, Rfl: 1   ondansetron (ZOFRAN) 8 MG tablet, Take 1 tablet (8 mg total) by mouth every 8 (eight) hours as needed for nausea or vomiting., Disp: 20 tablet, Rfl: 0   polyethylene glycol (MIRALAX / GLYCOLAX) 17 g packet, Take 17 g by mouth daily., Disp: , Rfl:     prochlorperazine (COMPAZINE) 10 MG tablet, Take 1 tablet (10 mg total) by mouth every 6 (six) hours as needed (Nausea or vomiting)., Disp: 30 tablet, Rfl: 1   temazepam (RESTORIL) 7.5 MG capsule, Take 1 capsule (7.5 mg total) by mouth at bedtime as needed for sleep., Disp: 30 capsule, Rfl: 0   traMADol (ULTRAM) 50 MG tablet, Take 50 mg by mouth every 6 (six) hours as needed., Disp: , Rfl:  No current facility-administered medications for this visit.  Facility-Administered Medications Ordered in Other Visits:    heparin lock flush 100 unit/mL, 500 Units, Intravenous, Once, Marvelyn Bouchillon, Rose Phi, MD   HYDROcodone-acetaminophen (NORCO/VICODIN) 5-325 MG per tablet 1 tablet, 1 tablet, Oral, Once, Erenest Blank, NP   sodium chloride flush (NS) 0.9 % injection 10 mL, 10 mL, Intravenous, Once, Moet Mikulski, Rose Phi, MD  Allergies:  Allergies  Allergen Reactions   Percocet [Oxycodone-Acetaminophen] Nausea And Vomiting   Oxycodone Other (See Comments)   Oxycodone-Aspirin Nausea And Vomiting    Past Medical History, Surgical history, Social history, and Family History were reviewed and updated.  Review of Systems: Review of Systems  Constitutional:  Positive for fatigue.  HENT:  Negative.    Eyes: Negative.   Respiratory: Negative.    Cardiovascular: Negative.   Gastrointestinal:  Positive for nausea.  Endocrine: Negative.   Genitourinary: Negative.    Musculoskeletal: Negative.   Skin: Negative.   Neurological: Negative.   Hematological: Negative.   Psychiatric/Behavioral: Negative.      Physical Exam:  height is 5\' 9"  (1.753 m) and weight is 145 lb (65.8 kg). His oral temperature is 98.6 F (37 C). His blood pressure is 123/53 (abnormal) and his pulse is 81. His respiration is 18 and oxygen saturation is 99%.   Wt Readings from Last 3 Encounters:  10/04/22 145 lb (65.8 kg)  09/06/22 145 lb (65.8 kg)  08/03/22 150 lb (68 kg)    Physical Exam Vitals reviewed.  HENT:     Head:  Normocephalic and atraumatic.  Eyes:     Pupils: Pupils are equal, round, and reactive to light.  Cardiovascular:     Rate and Rhythm: Normal rate and regular rhythm.     Heart sounds: Normal heart sounds.  Pulmonary:     Effort: Pulmonary effort is normal.     Breath sounds: Normal breath sounds.  Abdominal:     General: Bowel sounds are normal.     Palpations: Abdomen is soft.  Musculoskeletal:        General: No tenderness or deformity. Normal range of motion.     Cervical back: Normal range of motion.  Lymphadenopathy:  Cervical: No cervical adenopathy.  Skin:    General: Skin is warm and dry.     Findings: No erythema or rash.  Neurological:     Mental Status: He is alert and oriented to person, place, and time.  Psychiatric:        Behavior: Behavior normal.        Thought Content: Thought content normal.        Judgment: Judgment normal.   Lab Results  Component Value Date   WBC 5.0 10/04/2022   HGB 11.9 (L) 10/04/2022   HCT 37.4 (L) 10/04/2022   MCV 89.7 10/04/2022   PLT 292 10/04/2022     Chemistry      Component Value Date/Time   NA 137 09/06/2022 1137   NA 139 07/23/2020 0818   K 4.3 09/06/2022 1137   CL 101 09/06/2022 1137   CO2 29 09/06/2022 1137   BUN 19 09/06/2022 1137   BUN 10 07/23/2020 0818   CREATININE 1.09 09/06/2022 1137   CREATININE 1.06 06/27/2015 0837      Component Value Date/Time   CALCIUM 9.4 09/06/2022 1137   ALKPHOS 89 09/06/2022 1137   AST 14 (L) 09/06/2022 1137   ALT 14 09/06/2022 1137   BILITOT 0.4 09/06/2022 1137      Impression and Plan: Mr. Mayorquin is a very nice 82 year old white male.  He has what I hope to believe is localized gastric cancer.   He has recurrence of his disease.  He again, does not wish to have surgery.  We will go ahead with chemotherapy with immunotherapy.  I will try him on FOLFIRI/Opdivo.  I think this would be reasonable.  I think he would respond to this.  I do not think it would affect his  neuropathy.  Again he will need to have a PICC line put in.  We will see if we can get this in next week.  We will try to get treatment started on 10/19/2022.  I probably would give him 4 cycles of treatment and then repeat his PET scan.  If he has a good response, at some point, we may just consider him for immunotherapy.  I really do not think we need any biopsies.  When he had a biopsy back in October, we did do molecular studies on this tumor.  Hopefully, we will see that he responds and that he does not have a lot of toxicity.  I will plan for follow-up when I see him back for his second cycle of treatment.   Josph Macho, MD 4/29/20241:21 PM

## 2022-10-05 ENCOUNTER — Other Ambulatory Visit: Payer: Self-pay

## 2022-10-08 ENCOUNTER — Other Ambulatory Visit: Payer: Self-pay

## 2022-10-11 NOTE — Progress Notes (Signed)
Pharmacist Chemotherapy Monitoring - Initial Assessment    Anticipated start date: 10/19/22   The following has been reviewed per standard work regarding the patient's treatment regimen: The patient's diagnosis, treatment plan and drug doses, and organ/hematologic function Lab orders and baseline tests specific to treatment regimen  The treatment plan start date, drug sequencing, and pre-medications Prior authorization status  Patient's documented medication list, including drug-drug interaction screen and prescriptions for anti-emetics and supportive care specific to the treatment regimen The drug concentrations, fluid compatibility, administration routes, and timing of the medications to be used The patient's access for treatment and lifetime cumulative dose history, if applicable  The patient's medication allergies and previous infusion related reactions, if applicable   Changes made to treatment plan:  N/A  Follow up needed:  N/A   Alvena Kiernan, Nevin Bloodgood, RPH, 10/11/2022  8:21 AM

## 2022-10-12 ENCOUNTER — Encounter: Payer: Self-pay | Admitting: Hematology & Oncology

## 2022-10-17 ENCOUNTER — Other Ambulatory Visit: Payer: Self-pay | Admitting: Hematology & Oncology

## 2022-10-17 DIAGNOSIS — C160 Malignant neoplasm of cardia: Secondary | ICD-10-CM

## 2022-10-18 ENCOUNTER — Ambulatory Visit (HOSPITAL_COMMUNITY)
Admission: RE | Admit: 2022-10-18 | Discharge: 2022-10-18 | Disposition: A | Discharge status: Home / Self Care | Payer: Medicare HMO | Source: Ambulatory Visit | Attending: Hematology & Oncology | Admitting: Hematology & Oncology

## 2022-10-18 ENCOUNTER — Encounter: Payer: Self-pay | Admitting: Hematology & Oncology

## 2022-10-18 ENCOUNTER — Other Ambulatory Visit: Payer: Self-pay | Admitting: Hematology & Oncology

## 2022-10-18 DIAGNOSIS — Z452 Encounter for adjustment and management of vascular access device: Secondary | ICD-10-CM | POA: Diagnosis not present

## 2022-10-18 DIAGNOSIS — C160 Malignant neoplasm of cardia: Secondary | ICD-10-CM | POA: Diagnosis not present

## 2022-10-18 DIAGNOSIS — C169 Malignant neoplasm of stomach, unspecified: Secondary | ICD-10-CM | POA: Diagnosis not present

## 2022-10-18 MED ORDER — HEPARIN SOD (PORK) LOCK FLUSH 100 UNIT/ML IV SOLN
INTRAVENOUS | Status: AC
  Filled 2022-10-18: qty 5

## 2022-10-18 MED ORDER — TEMAZEPAM 7.5 MG PO CAPS
7.5000 mg | ORAL_CAPSULE | Freq: Every evening | ORAL | 0 refills | Status: DC | PRN
Start: 2022-10-18 — End: 2022-11-18

## 2022-10-18 MED ORDER — HEPARIN SOD (PORK) LOCK FLUSH 100 UNIT/ML IV SOLN
500.0000 [IU] | Freq: Once | INTRAVENOUS | Status: AC
  Administered 2022-10-18: 500 [IU] via INTRAVENOUS

## 2022-10-18 MED ORDER — LIDOCAINE HCL 1 % IJ SOLN
20.0000 mL | Freq: Once | INTRAMUSCULAR | Status: AC
  Administered 2022-10-18: 3 mL via INTRADERMAL

## 2022-10-18 MED ORDER — LIDOCAINE HCL 1 % IJ SOLN
INTRAMUSCULAR | Status: AC
  Filled 2022-10-18: qty 20

## 2022-10-18 NOTE — Procedures (Signed)
Vascular and Interventional Radiology Procedure Note  Patient: George Dyer DOB: Jan 05, 1941 Medical Record Number: [id number] Note Date/Time: 10/18/22 9:17 AM   Performing Physician: Roanna Banning, MD Assistant(s): None  Diagnosis:  Chemo Rx. Central venous occlusion  Procedure: PERIPHERALLY INSERTED CENTRAL CATHETER (PICC) PLACEMENT  Anesthesia: Local Anesthetic Complications: None Estimated Blood Loss: Minimal  Findings:  Successful left-sided 36 cm DL PICC placement, with the tip of the catheter in the SVC  Plan: Catheter ready for use.  See detailed procedure note with images in PACS. The patient tolerated the procedure well without incident or complication and was returned to Recovery in stable condition.    Roanna Banning, MD [facility]   Pager. [phone] Clinic. [phone]

## 2022-10-19 ENCOUNTER — Inpatient Hospital Stay: Payer: Medicare HMO | Admitting: Hematology & Oncology

## 2022-10-19 ENCOUNTER — Encounter: Payer: Self-pay | Admitting: Hematology & Oncology

## 2022-10-19 ENCOUNTER — Other Ambulatory Visit (HOSPITAL_BASED_OUTPATIENT_CLINIC_OR_DEPARTMENT_OTHER): Payer: Self-pay

## 2022-10-19 ENCOUNTER — Inpatient Hospital Stay: Payer: Medicare HMO

## 2022-10-19 ENCOUNTER — Other Ambulatory Visit: Payer: Self-pay | Admitting: *Deleted

## 2022-10-19 ENCOUNTER — Inpatient Hospital Stay: Payer: Medicare HMO | Attending: Hematology & Oncology

## 2022-10-19 ENCOUNTER — Telehealth: Payer: Self-pay | Admitting: *Deleted

## 2022-10-19 VITALS — BP 143/68 | HR 67 | Temp 98.7°F | Resp 16 | Ht 69.0 in | Wt 145.0 lb

## 2022-10-19 DIAGNOSIS — Z79620 Long term (current) use of immunosuppressive biologic: Secondary | ICD-10-CM | POA: Diagnosis not present

## 2022-10-19 DIAGNOSIS — Z5189 Encounter for other specified aftercare: Secondary | ICD-10-CM | POA: Diagnosis not present

## 2022-10-19 DIAGNOSIS — C169 Malignant neoplasm of stomach, unspecified: Secondary | ICD-10-CM | POA: Insufficient documentation

## 2022-10-19 DIAGNOSIS — R1110 Vomiting, unspecified: Secondary | ICD-10-CM | POA: Diagnosis not present

## 2022-10-19 DIAGNOSIS — C160 Malignant neoplasm of cardia: Secondary | ICD-10-CM

## 2022-10-19 DIAGNOSIS — Z5112 Encounter for antineoplastic immunotherapy: Secondary | ICD-10-CM | POA: Insufficient documentation

## 2022-10-19 DIAGNOSIS — R112 Nausea with vomiting, unspecified: Secondary | ICD-10-CM

## 2022-10-19 DIAGNOSIS — Z5111 Encounter for antineoplastic chemotherapy: Secondary | ICD-10-CM | POA: Insufficient documentation

## 2022-10-19 LAB — LACTATE DEHYDROGENASE: LDH: 104 U/L (ref 98–192)

## 2022-10-19 LAB — CBC WITH DIFFERENTIAL (CANCER CENTER ONLY)
Abs Immature Granulocytes: 0.03 10*3/uL (ref 0.00–0.07)
Basophils Absolute: 0.1 10*3/uL (ref 0.0–0.1)
Basophils Relative: 1 %
Eosinophils Absolute: 0.4 10*3/uL (ref 0.0–0.5)
Eosinophils Relative: 7 %
HCT: 36.4 % — ABNORMAL LOW (ref 39.0–52.0)
Hemoglobin: 11.7 g/dL — ABNORMAL LOW (ref 13.0–17.0)
Immature Granulocytes: 1 %
Lymphocytes Relative: 13 %
Lymphs Abs: 0.8 10*3/uL (ref 0.7–4.0)
MCH: 28.7 pg (ref 26.0–34.0)
MCHC: 32.1 g/dL (ref 30.0–36.0)
MCV: 89.2 fL (ref 80.0–100.0)
Monocytes Absolute: 0.5 10*3/uL (ref 0.1–1.0)
Monocytes Relative: 8 %
Neutro Abs: 4.4 10*3/uL (ref 1.7–7.7)
Neutrophils Relative %: 70 %
Platelet Count: 284 10*3/uL (ref 150–400)
RBC: 4.08 MIL/uL — ABNORMAL LOW (ref 4.22–5.81)
RDW: 13.9 % (ref 11.5–15.5)
WBC Count: 6.2 10*3/uL (ref 4.0–10.5)
nRBC: 0 % (ref 0.0–0.2)

## 2022-10-19 LAB — CMP (CANCER CENTER ONLY)
ALT: 10 U/L (ref 0–44)
AST: 12 U/L — ABNORMAL LOW (ref 15–41)
Albumin: 4.1 g/dL (ref 3.5–5.0)
Alkaline Phosphatase: 85 U/L (ref 38–126)
Anion gap: 5 (ref 5–15)
BUN: 19 mg/dL (ref 8–23)
CO2: 29 mmol/L (ref 22–32)
Calcium: 9.2 mg/dL (ref 8.9–10.3)
Chloride: 104 mmol/L (ref 98–111)
Creatinine: 0.86 mg/dL (ref 0.61–1.24)
GFR, Estimated: >60 mL/min (ref >60)
Glucose, Bld: 99 mg/dL (ref 70–99)
Potassium: 3.9 mmol/L (ref 3.5–5.1)
Sodium: 138 mmol/L (ref 135–145)
Total Bilirubin: 0.6 mg/dL (ref 0.3–1.2)
Total Protein: 6.2 g/dL — ABNORMAL LOW (ref 6.5–8.1)

## 2022-10-19 LAB — SAMPLE TO BLOOD BANK

## 2022-10-19 MED ORDER — SODIUM CHLORIDE 0.9% FLUSH
10.0000 mL | INTRAVENOUS | 1 refills | Status: DC
  Filled 2022-10-19: qty 240, 56d supply, fill #0
  Filled 2022-11-23: qty 240, 56d supply, fill #1
  Filled 2022-11-29: qty 240, 28d supply, fill #1

## 2022-10-19 MED ORDER — DEXAMETHASONE 4 MG PO TABS
8.0000 mg | ORAL_TABLET | Freq: Every day | ORAL | 5 refills | Status: DC
Start: 2022-10-19 — End: 2023-04-07

## 2022-10-19 MED ORDER — SODIUM CHLORIDE 0.9 % IV SOLN
1920.0000 mg/m2 | INTRAVENOUS | Status: DC
  Administered 2022-10-19: 3500 mg via INTRAVENOUS
  Filled 2022-10-19: qty 70

## 2022-10-19 MED ORDER — LORAZEPAM 1 MG PO TABS
0.5000 mg | ORAL_TABLET | Freq: Once | ORAL | Status: AC
  Administered 2022-10-19: 0.5 mg via ORAL
  Filled 2022-10-19: qty 1

## 2022-10-19 MED ORDER — METOCLOPRAMIDE HCL 5 MG/ML IJ SOLN
10.0000 mg | Freq: Once | INTRAMUSCULAR | Status: AC
  Administered 2022-10-19: 10 mg via INTRAVENOUS

## 2022-10-19 MED ORDER — PALONOSETRON HCL INJECTION 0.25 MG/5ML
0.2500 mg | Freq: Once | INTRAVENOUS | Status: AC
  Administered 2022-10-19: 0.25 mg via INTRAVENOUS
  Filled 2022-10-19: qty 5

## 2022-10-19 MED ORDER — SODIUM CHLORIDE 0.9 % IV SOLN: Freq: Once | INTRAVENOUS | Status: AC

## 2022-10-19 MED ORDER — DEXTROSE 5 % IV SOLN
20.0000 mg | Freq: Once | INTRAVENOUS | Status: DC
Start: 2022-10-19 — End: 2022-10-19

## 2022-10-19 MED ORDER — HEPARIN NA (PORK) LOCK FLSH PF 100 UNIT/ML IV SOLN
2.5000 mL | INTRAVENOUS | 1 refills | Status: DC
  Filled 2022-10-19: qty 120, 56d supply, fill #0
  Filled 2022-11-23: qty 120, 56d supply, fill #1
  Filled 2022-11-29: qty 120, 14d supply, fill #1
  Filled 2022-11-29: qty 120, 28d supply, fill #1

## 2022-10-19 MED ORDER — LACTULOSE 10 GM/15ML PO SOLN: 20.0000 g | Freq: Three times a day (TID) | ORAL | 4 refills | Status: DC

## 2022-10-19 MED ORDER — PROCHLORPERAZINE MALEATE 10 MG PO TABS
10.0000 mg | ORAL_TABLET | Freq: Four times a day (QID) | ORAL | 1 refills | Status: DC | PRN
Start: 2022-10-19 — End: 2023-04-07

## 2022-10-19 MED ORDER — HEPARIN SOD (PORK) LOCK FLUSH 100 UNIT/ML IV SOLN
250.0000 [IU] | INTRAVENOUS | Status: AC | PRN
  Administered 2022-10-19: 250 [IU]

## 2022-10-19 MED ORDER — SODIUM CHLORIDE 0.9 % IV SOLN
400.0000 mg/m2 | Freq: Once | INTRAVENOUS | Status: AC
  Administered 2022-10-19: 716 mg via INTRAVENOUS
  Filled 2022-10-19: qty 35.8

## 2022-10-19 MED ORDER — SODIUM CHLORIDE 0.9 % IV SOLN
240.0000 mg | INTRAVENOUS | Status: DC
  Administered 2022-10-19: 240 mg via INTRAVENOUS
  Filled 2022-10-19: qty 24

## 2022-10-19 MED ORDER — ONDANSETRON HCL 8 MG PO TABS
8.0000 mg | ORAL_TABLET | Freq: Three times a day (TID) | ORAL | 1 refills | Status: DC | PRN
Start: 2022-10-19 — End: 2023-04-07

## 2022-10-19 MED ORDER — LOPERAMIDE HCL 2 MG PO CAPS
ORAL_CAPSULE | ORAL | 3 refills | Status: DC
Start: 2022-10-19 — End: 2023-07-06

## 2022-10-19 MED ORDER — SODIUM CHLORIDE 0.9 % IV SOLN
10.0000 mg | Freq: Once | INTRAVENOUS | Status: AC
  Administered 2022-10-19: 10 mg via INTRAVENOUS
  Filled 2022-10-19: qty 10

## 2022-10-19 MED ORDER — FLUOROURACIL CHEMO INJECTION 2.5 GM/50ML
400.0000 mg/m2 | Freq: Once | INTRAVENOUS | Status: AC
  Administered 2022-10-19: 700 mg via INTRAVENOUS
  Filled 2022-10-19: qty 14

## 2022-10-19 MED ORDER — ATROPINE SULFATE 1 MG/ML IV SOLN
0.5000 mg | Freq: Once | INTRAVENOUS | Status: AC | PRN
  Administered 2022-10-19: 0.5 mg via INTRAVENOUS
  Filled 2022-10-19: qty 1

## 2022-10-19 MED ORDER — HYDROMORPHONE HCL 1 MG/ML IJ SOLN
2.0000 mg | Freq: Once | INTRAMUSCULAR | Status: AC
  Administered 2022-10-19: 2 mg via INTRAVENOUS
  Filled 2022-10-19: qty 2

## 2022-10-19 MED ORDER — SODIUM CHLORIDE 0.9 % IV SOLN
144.0000 mg/m2 | Freq: Once | INTRAVENOUS | Status: AC
  Administered 2022-10-19: 260 mg via INTRAVENOUS
  Filled 2022-10-19: qty 5

## 2022-10-19 MED ORDER — SODIUM CHLORIDE 0.9% FLUSH
10.0000 mL | INTRAVENOUS | Status: AC | PRN
  Administered 2022-10-19: 10 mL

## 2022-10-19 NOTE — Progress Notes (Signed)
Patent offered saltine crackers and ginger ale due to nausea despite antiemetics administered in clinic and before arriving to clinic this morning patient actively vomiting in infusion room while chemotherapy hanging.  Order given and carried out for Ativan 0.5 mg PO per Dr. Myna Hidalgo. Patient and wife verbalized understanding.

## 2022-10-19 NOTE — Progress Notes (Signed)
Ma'am yes no surgery yes ma'am good Hematology and Oncology Follow Up Visit  George Dyer [id number] 1941-05-09 82 y.o. 10/19/2022   Principle Diagnosis:  Stage IIB (T3N0M0) Gastric cancer -- declines surgery -recurrent-  HER2(-)/ BRAF (+)  Current Therapy:   FOLFOX - s/p cycle #4-  start on 09/15/2021 XRT/Xeloda -- start on 12/07/2021 --completed on 01/12/2022 FOLFIRI/Opdivo -- start cycle #1 on 10/19/2022     Interim History:  George Dyer is back for follow-up.  He did have his PET scan done.  This only showed activity in the stomach.  Again, he does not wish to have a gastrectomy.  Constipation is his biggest problem.  He has tried Clinical biochemist.  He has tried other over-the-counter agents.  Nothing has worked.  I told him to try some olive oil.  I told him to try 2 tablespoons of olive oil 2 or 3 times a day.  I will also call in some lactulose.  We will find get started on treatment.  I do think that he will respond to treatment.  We can use chemotherapy along with Opdivo.  He did have a PICC line placed.  This was in his left arm.  There is little bit of discomfort.  He has had no cough or shortness of breath.  He has had no urinary difficulties.  He has had the neuropathy in his legs.  Overall, I would have to say that his performance status is ECOG 1.     Medications:  Current Outpatient Medications:    acetaminophen (TYLENOL) 325 MG tablet, Take 650 mg by mouth 2 (two) times daily as needed., Disp: , Rfl:    alum & mag hydroxide-simeth (MAALOX/MYLANTA) 200-200-20 MG/5ML suspension, Take 30 mLs by mouth daily., Disp: , Rfl:    levothyroxine (SYNTHROID) 75 MCG tablet, Take 1 tablet (75 mcg total) by mouth daily before breakfast., Disp: 30 tablet, Rfl: 4   morphine (MSIR) 15 MG tablet, Take 1 tablet (15 mg total) by mouth every 6 (six) hours as needed for severe pain., Disp: 120 tablet, Rfl: 0   omeprazole (PRILOSEC) 40 MG capsule, TAKE 1 CAPSULE (40 MG TOTAL) BY MOUTH DAILY.,  Disp: 90 capsule, Rfl: 1   ondansetron (ZOFRAN) 8 MG tablet, Take 1 tablet (8 mg total) by mouth every 8 (eight) hours as needed for nausea or vomiting., Disp: 20 tablet, Rfl: 0   polyethylene glycol (MIRALAX / GLYCOLAX) 17 g packet, Take 17 g by mouth daily., Disp: , Rfl:    prochlorperazine (COMPAZINE) 10 MG tablet, Take 1 tablet (10 mg total) by mouth every 6 (six) hours as needed (Nausea or vomiting)., Disp: 30 tablet, Rfl: 1   traMADol (ULTRAM) 50 MG tablet, Take 50 mg by mouth every 6 (six) hours as needed., Disp: , Rfl:    [START ON 11/02/2022] capsaicin topical system (QUTENZA, 4 PATCH,) 8 %, Apply 4 patches topically once for 1 dose. Placed on affected area of skin every 3 months as needed for neuropathy. (Patient not taking: Reported on 10/19/2022), Disp: 4 patch, Rfl: 0   dronabinol (MARINOL) 2.5 MG capsule, Take 1 capsule (2.5 mg total) by mouth 2 (two) times daily before a meal. (Patient not taking: Reported on 10/19/2022), Disp: 60 capsule, Rfl: 0   DULoxetine (CYMBALTA) 30 MG capsule, Take 2 capsules (60 mg total) by mouth at bedtime. Take 1 capsule (30 mg) for the 1st week, then increase to 2 capsules thereafter. (Patient not taking: Reported on 10/19/2022), Disp: 60 capsule, Rfl: 3  megestrol (MEGACE ES) 625 MG/5ML suspension, Take 5 mLs (625 mg total) by mouth daily. (Patient not taking: Reported on 10/19/2022), Disp: 150 mL, Rfl: 4   megestrol (MEGACE) 400 MG/10ML suspension, Take 10 mLs (400 mg total) by mouth 2 (two) times daily. (Patient not taking: Reported on 10/19/2022), Disp: 240 mL, Rfl: 0   metFORMIN (GLUCOPHAGE) 500 MG tablet, Take 1 tablet (500 mg total) by mouth daily with breakfast. (Patient not taking: Reported on 10/19/2022), Disp: 90 tablet, Rfl: 3   temazepam (RESTORIL) 7.5 MG capsule, Take 1 capsule (7.5 mg total) by mouth at bedtime as needed for sleep. (Patient not taking: Reported on 10/19/2022), Disp: 30 capsule, Rfl: 0 No current facility-administered medications for  this visit.  Facility-Administered Medications Ordered in Other Visits:    heparin lock flush 100 unit/mL, 500 Units, Intravenous, Once, Antone Summons, Rose Phi, MD   HYDROcodone-acetaminophen (NORCO/VICODIN) 5-325 MG per tablet 1 tablet, 1 tablet, Oral, Once, Erenest Blank, NP   sodium chloride flush (NS) 0.9 % injection 10 mL, 10 mL, Intravenous, Once, Caitrin Pendergraph, Rose Phi, MD  Allergies:  Allergies  Allergen Reactions   Percocet [Oxycodone-Acetaminophen] Nausea And Vomiting   Oxycodone Other (See Comments)   Oxycodone-Aspirin Nausea And Vomiting    Past Medical History, Surgical history, Social history, and Family History were reviewed and updated.  Review of Systems: Review of Systems  Constitutional:  Positive for fatigue.  HENT:  Negative.    Eyes: Negative.   Respiratory: Negative.    Cardiovascular: Negative.   Gastrointestinal:  Positive for nausea.  Endocrine: Negative.   Genitourinary: Negative.    Musculoskeletal: Negative.   Skin: Negative.   Neurological: Negative.   Hematological: Negative.   Psychiatric/Behavioral: Negative.      Physical Exam:  height is 5\' 9"  (1.753 m) and weight is 145 lb (65.8 kg). His oral temperature is 98.7 F (37.1 C). His blood pressure is 143/68 (abnormal) and his pulse is 67. His respiration is 16 and oxygen saturation is 99%.   Wt Readings from Last 3 Encounters:  10/19/22 145 lb (65.8 kg)  10/04/22 145 lb (65.8 kg)  09/06/22 145 lb (65.8 kg)    Physical Exam Vitals reviewed.  HENT:     Head: Normocephalic and atraumatic.  Eyes:     Pupils: Pupils are equal, round, and reactive to light.  Cardiovascular:     Rate and Rhythm: Normal rate and regular rhythm.     Heart sounds: Normal heart sounds.  Pulmonary:     Effort: Pulmonary effort is normal.     Breath sounds: Normal breath sounds.  Abdominal:     General: Bowel sounds are normal.     Palpations: Abdomen is soft.  Musculoskeletal:        General: No tenderness or  deformity. Normal range of motion.     Cervical back: Normal range of motion.  Lymphadenopathy:     Cervical: No cervical adenopathy.  Skin:    General: Skin is warm and dry.     Findings: No erythema or rash.  Neurological:     Mental Status: He is alert and oriented to person, place, and time.  Psychiatric:        Behavior: Behavior normal.        Thought Content: Thought content normal.        Judgment: Judgment normal.   Lab Results  Component Value Date   WBC 5.0 10/04/2022   HGB 11.9 (L) 10/04/2022   HCT 37.4 (L) 10/04/2022  MCV 89.7 10/04/2022   PLT 292 10/04/2022     Chemistry      Component Value Date/Time   NA 141 10/04/2022 1252   NA 139 07/23/2020 0818   K 4.4 10/04/2022 1252   CL 105 10/04/2022 1252   CO2 29 10/04/2022 1252   BUN 22 10/04/2022 1252   BUN 10 07/23/2020 0818   CREATININE 1.07 10/04/2022 1252   CREATININE 1.06 06/27/2015 0837      Component Value Date/Time   CALCIUM 8.9 10/04/2022 1252   ALKPHOS 77 10/04/2022 1252   AST 14 (L) 10/04/2022 1252   ALT 14 10/04/2022 1252   BILITOT 0.4 10/04/2022 1252      Impression and Plan: George Dyer is a very nice 82 year old white male.  He has what I hope to believe is localized gastric cancer.   He has recurrence of his disease.  This is still localized to his stomach.  Maybe, we can still treat this down and so he might have some kind of local procedure.  I would give him 4 cycles of FOLFIRI/Opdivo.  I would then repeat his PET scan.  Again I really believe that we should see a response.  Possibly, if we do get a very good response we does have him on maintenance Opdivo.  We will plan for another follow-up in 2 weeks.  If his blood counts are adequate, and would like we will have to go to 3 weeks.    Josph Macho, MD 5/14/20248:12 AM

## 2022-10-19 NOTE — Telephone Encounter (Signed)
Per scheduling message George Dyer -  called and left a voice mail about the Wednesday appointments for the Picc line dressing change, requested call back to confirm.

## 2022-10-19 NOTE — Progress Notes (Signed)
Pt have n/v x1 green in lime color. VSS 131/59 Hr 57 R17 Reviewed with MD, VO reglan 20mg  IVPB

## 2022-10-19 NOTE — Patient Instructions (Signed)
[facility]  Discharge Instructions: Thank you for choosing [facility] to provide your oncology and hematology care.   If you have a lab appointment with the Cancer Center, please go directly to the Cancer Center and check in at the registration area.  Wear comfortable clothing and clothing appropriate for easy access to any Portacath or PICC line.   We strive to give you quality time with your provider. You may need to reschedule your appointment if you arrive late (15 or more minutes).  Arriving late affects you and other patients whose appointments are after yours.  Also, if you miss three or more appointments without notifying the office, you may be dismissed from the clinic at the provider's discretion.      For prescription refill requests, have your pharmacy contact our office and allow 72 hours for refills to be completed.    Today you received the following chemotherapy and/or immunotherapy agents:  Irinotecan, Leucovorin, 5-FU and Opdivo.       To help prevent nausea and vomiting after your treatment, we encourage you to take your nausea medication as directed.  BELOW ARE SYMPTOMS THAT SHOULD BE REPORTED IMMEDIATELY: *FEVER GREATER THAN 100.4 F (38 C) OR HIGHER *CHILLS OR SWEATING *NAUSEA AND VOMITING THAT IS NOT CONTROLLED WITH YOUR NAUSEA MEDICATION *UNUSUAL SHORTNESS OF BREATH *UNUSUAL BRUISING OR BLEEDING *URINARY PROBLEMS (pain or burning when urinating, or frequent urination) *BOWEL PROBLEMS (unusual diarrhea, constipation, pain near the anus) TENDERNESS IN MOUTH AND THROAT WITH OR WITHOUT PRESENCE OF ULCERS (sore throat, sores in mouth, or a toothache) UNUSUAL RASH, SWELLING OR PAIN  UNUSUAL VAGINAL DISCHARGE OR ITCHING   Items with * indicate a potential emergency and should be followed up as soon as possible or go to the Emergency Department if any problems should occur.  Please show the CHEMOTHERAPY ALERT CARD or  IMMUNOTHERAPY ALERT CARD at check-in to the Emergency Department and triage nurse. Should you have questions after your visit or need to cancel or reschedule your appointment, please contact [facility] [place]  [phone] and follow the prompts.  Office hours are 8:00 a.m. to 4:30 p.m. Monday - Friday. Please note that voicemails left after 4:00 p.m. may not be returned until the following business day.  We are closed weekends and major holidays. You have access to a nurse at all times for urgent questions. Please call the main number to the clinic [phone] and follow the prompts.  For any non-urgent questions, you may also contact your provider using MyChart. We now offer e-Visits for anyone 77 and older to request care online for non-urgent symptoms. For details visit [email or web address].   Also download the MyChart app! Go to the app store, search "MyChart", open the app, select [place], and log in with your MyChart username and password.

## 2022-10-19 NOTE — Progress Notes (Signed)
1015-Pt complaining of neuropathy pain to lower legs and feet and is requesting something for pain.  Dr. Myna Hidalgo notified and order received to give pt Dilaudid 2 mg IV now, to continue MSIR as ordered at home and to take Cymbalta as ordered for neuropathy.  Pt.'s wife states that pt has not been taking Cymbalta, but that he will start it today for neuropathy.

## 2022-10-19 NOTE — Addendum Note (Signed)
Addended by: Arlan Organ R on: 10/19/2022 10:07 AM   Modules accepted: Orders

## 2022-10-20 ENCOUNTER — Inpatient Hospital Stay: Payer: Medicare HMO

## 2022-10-21 ENCOUNTER — Inpatient Hospital Stay: Payer: Medicare HMO

## 2022-10-21 ENCOUNTER — Other Ambulatory Visit: Payer: Self-pay

## 2022-10-21 ENCOUNTER — Encounter: Payer: Self-pay | Admitting: Hematology & Oncology

## 2022-10-21 ENCOUNTER — Inpatient Hospital Stay (HOSPITAL_BASED_OUTPATIENT_CLINIC_OR_DEPARTMENT_OTHER): Payer: Medicare HMO | Admitting: Hematology & Oncology

## 2022-10-21 VITALS — BP 146/63 | HR 74 | Temp 98.7°F | Resp 16 | Ht 69.0 in | Wt 145.0 lb

## 2022-10-21 VITALS — BP 146/63 | HR 74 | Temp 98.7°F | Resp 16

## 2022-10-21 DIAGNOSIS — R1110 Vomiting, unspecified: Secondary | ICD-10-CM | POA: Diagnosis not present

## 2022-10-21 DIAGNOSIS — C160 Malignant neoplasm of cardia: Secondary | ICD-10-CM | POA: Diagnosis not present

## 2022-10-21 DIAGNOSIS — Z79620 Long term (current) use of immunosuppressive biologic: Secondary | ICD-10-CM | POA: Diagnosis not present

## 2022-10-21 DIAGNOSIS — Z5112 Encounter for antineoplastic immunotherapy: Secondary | ICD-10-CM | POA: Diagnosis not present

## 2022-10-21 DIAGNOSIS — C169 Malignant neoplasm of stomach, unspecified: Secondary | ICD-10-CM | POA: Diagnosis not present

## 2022-10-21 DIAGNOSIS — Z5189 Encounter for other specified aftercare: Secondary | ICD-10-CM | POA: Diagnosis not present

## 2022-10-21 DIAGNOSIS — Z5111 Encounter for antineoplastic chemotherapy: Secondary | ICD-10-CM | POA: Diagnosis not present

## 2022-10-21 LAB — CBC WITH DIFFERENTIAL (CANCER CENTER ONLY)
Abs Immature Granulocytes: 0.03 10*3/uL (ref 0.00–0.07)
Basophils Absolute: 0 10*3/uL (ref 0.0–0.1)
Basophils Relative: 0 %
Eosinophils Absolute: 0.1 10*3/uL (ref 0.0–0.5)
Eosinophils Relative: 1 %
HCT: 35.9 % — ABNORMAL LOW (ref 39.0–52.0)
Hemoglobin: 11.3 g/dL — ABNORMAL LOW (ref 13.0–17.0)
Immature Granulocytes: 0 %
Lymphocytes Relative: 4 %
Lymphs Abs: 0.4 10*3/uL — ABNORMAL LOW (ref 0.7–4.0)
MCH: 28.3 pg (ref 26.0–34.0)
MCHC: 31.5 g/dL (ref 30.0–36.0)
MCV: 89.8 fL (ref 80.0–100.0)
Monocytes Absolute: 0.3 10*3/uL (ref 0.1–1.0)
Monocytes Relative: 3 %
Neutro Abs: 9.2 10*3/uL — ABNORMAL HIGH (ref 1.7–7.7)
Neutrophils Relative %: 92 %
Platelet Count: 265 10*3/uL (ref 150–400)
RBC: 4 MIL/uL — ABNORMAL LOW (ref 4.22–5.81)
RDW: 13.9 % (ref 11.5–15.5)
WBC Count: 10 10*3/uL (ref 4.0–10.5)
nRBC: 0 % (ref 0.0–0.2)

## 2022-10-21 LAB — CMP (CANCER CENTER ONLY)
ALT: 20 U/L (ref 0–44)
AST: 17 U/L (ref 15–41)
Albumin: 4 g/dL (ref 3.5–5.0)
Alkaline Phosphatase: 87 U/L (ref 38–126)
Anion gap: 3 — ABNORMAL LOW (ref 5–15)
BUN: 22 mg/dL (ref 8–23)
CO2: 31 mmol/L (ref 22–32)
Calcium: 9.3 mg/dL (ref 8.9–10.3)
Chloride: 102 mmol/L (ref 98–111)
Creatinine: 0.87 mg/dL (ref 0.61–1.24)
GFR, Estimated: >60 mL/min (ref >60)
Glucose, Bld: 120 mg/dL — ABNORMAL HIGH (ref 70–99)
Potassium: 4.2 mmol/L (ref 3.5–5.1)
Sodium: 136 mmol/L (ref 135–145)
Total Bilirubin: 0.5 mg/dL (ref 0.3–1.2)
Total Protein: 6.2 g/dL — ABNORMAL LOW (ref 6.5–8.1)

## 2022-10-21 LAB — SAMPLE TO BLOOD BANK

## 2022-10-21 MED ORDER — SODIUM CHLORIDE 0.9 % IV SOLN: Freq: Once | INTRAVENOUS | Status: AC

## 2022-10-21 MED ORDER — SODIUM CHLORIDE 0.9 % IV SOLN
150.0000 mg | Freq: Once | INTRAVENOUS | Status: AC
  Administered 2022-10-21: 150 mg via INTRAVENOUS
  Filled 2022-10-21: qty 150

## 2022-10-21 MED ORDER — HEPARIN SOD (PORK) LOCK FLUSH 100 UNIT/ML IV SOLN: 500.0000 [IU] | Freq: Once | INTRAVENOUS | Status: DC | PRN

## 2022-10-21 MED ORDER — LORAZEPAM 1 MG PO TABS
0.5000 mg | ORAL_TABLET | Freq: Once | ORAL | Status: AC
  Administered 2022-10-21: 0.5 mg via ORAL
  Filled 2022-10-21: qty 1

## 2022-10-21 MED ORDER — SODIUM CHLORIDE 0.9% FLUSH: 10.0000 mL | INTRAVENOUS | Status: DC | PRN

## 2022-10-21 NOTE — Patient Instructions (Signed)

## 2022-10-21 NOTE — Progress Notes (Signed)
Ma'am yes no surgery yes ma'am good Hematology and Oncology Follow Up Visit  George Dyer [id number] 04-24-1941 82 y.o. 10/21/2022   Principle Diagnosis:  Stage IIB (T3N0M0) Gastric cancer -- declines surgery -recurrent-  HER2(-)/ BRAF (+)  Current Therapy:   FOLFOX - s/p cycle #4-  start on 09/15/2021 XRT/Xeloda -- start on 12/07/2021 --completed on 01/12/2022 FOLFIRI/Opdivo -- start cycle #1 on 10/19/2022     Interim History:  George Dyer is back for an early visit.  Unfortunately, he has been having a lot of vomiting ever since he had treatment.  I decrease his treatment doses to 1 weeks for started.  Again he still having quite a bit of vomiting.  He said that he was throwing up black material.  Then turned yellow.  He has had no fever.  He has had some abdominal pain.  He did get atropine with his chemotherapy.  I just hate that he has had the amount of problem.  I really thought that with the dosage reduction, that he would do okay.  I did do a rectal exam on him.  The stool was brown and heme negative.  His labs thankfully, all look okay.  There is no renal issues.  His liver tests all look fine.  He has not been able to keep anything down.  We have given him some IV fluid.  I will give him a dose of Emend.  Hopefully, this may help.  I will also try him on some lorazepam.  Will see if this works.  I do not think he has been constipated.  I know he has had constipation in the past.  He has had no rashes.  There is been no leg swelling.  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  Currently, I would say his performance status is probably ECOG 2.    Medications:  Current Outpatient Medications:    acetaminophen (TYLENOL) 325 MG tablet, Take 650 mg by mouth 2 (two) times daily as needed., Disp: , Rfl:    alum & mag hydroxide-simeth (MAALOX/MYLANTA) 200-200-20 MG/5ML suspension, Take 30 mLs by mouth daily., Disp: , Rfl:    dexamethasone (DECADRON) 4 MG tablet, Take 2  tablets (8 mg total) by mouth daily. Start the day after chemotherapy for 2 days. Take with food., Disp: 8 tablet, Rfl: 5   dronabinol (MARINOL) 2.5 MG capsule, Take 1 capsule (2.5 mg total) by mouth 2 (two) times daily before a meal. (Patient not taking: Reported on 10/19/2022), Disp: 60 capsule, Rfl: 0   DULoxetine (CYMBALTA) 30 MG capsule, Take 2 capsules (60 mg total) by mouth at bedtime. Take 1 capsule (30 mg) for the 1st week, then increase to 2 capsules thereafter. (Patient not taking: Reported on 10/19/2022), Disp: 60 capsule, Rfl: 3   Heparin Na, Pork, Lock Flsh PF 100 UNIT/ML SOLN, Flush each PICC lumen with 2.5 mLs (250 Units total) as directed every Monday, Wednesday, and Friday., Disp: 120 mL, Rfl: 1   lactulose (CHRONULAC) 10 GM/15ML solution, Take 30 mLs (20 g total) by mouth 3 (three) times daily., Disp: 1000 mL, Rfl: 4   levothyroxine (SYNTHROID) 75 MCG tablet, Take 1 tablet (75 mcg total) by mouth daily before breakfast., Disp: 30 tablet, Rfl: 4   loperamide (IMODIUM) 2 MG capsule, Take 2 tabs by mouth with first loose stool, then 1 tab with each additional loose stool as needed. Do not exceed 8 tabs in a 24-hour period, Disp: 100 capsule, Rfl: 3  megestrol (MEGACE ES) 625 MG/5ML suspension, Take 5 mLs (625 mg total) by mouth daily. (Patient not taking: Reported on 10/19/2022), Disp: 150 mL, Rfl: 4   megestrol (MEGACE) 400 MG/10ML suspension, Take 10 mLs (400 mg total) by mouth 2 (two) times daily. (Patient not taking: Reported on 10/19/2022), Disp: 240 mL, Rfl: 0   metFORMIN (GLUCOPHAGE) 500 MG tablet, Take 1 tablet (500 mg total) by mouth daily with breakfast. (Patient not taking: Reported on 10/19/2022), Disp: 90 tablet, Rfl: 3   morphine (MSIR) 15 MG tablet, Take 1 tablet (15 mg total) by mouth every 6 (six) hours as needed for severe pain., Disp: 120 tablet, Rfl: 0   omeprazole (PRILOSEC) 40 MG capsule, TAKE 1 CAPSULE (40 MG TOTAL) BY MOUTH DAILY., Disp: 90 capsule, Rfl: 1    ondansetron (ZOFRAN) 8 MG tablet, Take 1 tablet (8 mg total) by mouth every 8 (eight) hours as needed for nausea, vomiting or refractory nausea / vomiting. Start on the third day after chemotherapy., Disp: 30 tablet, Rfl: 1   polyethylene glycol (MIRALAX / GLYCOLAX) 17 g packet, Take 17 g by mouth daily., Disp: , Rfl:    prochlorperazine (COMPAZINE) 10 MG tablet, Take 1 tablet (10 mg total) by mouth every 6 (six) hours as needed for nausea or vomiting., Disp: 30 tablet, Rfl: 1   sodium chloride flush (NS) 0.9 % SOLN, Inject 10 mLs into each port 3 (three) times a week. Flush each port of PICC Monday, Wednesday and Friday., Disp: 240 mL, Rfl: 1   temazepam (RESTORIL) 7.5 MG capsule, Take 1 capsule (7.5 mg total) by mouth at bedtime as needed for sleep. (Patient not taking: Reported on 10/19/2022), Disp: 30 capsule, Rfl: 0 No current facility-administered medications for this visit.  Facility-Administered Medications Ordered in Other Visits:    heparin lock flush 100 unit/mL, 500 Units, Intravenous, Once, Beatryce Colombo, Rose Phi, MD   heparin lock flush 100 unit/mL, 500 Units, Intracatheter, Once PRN, Myna Hidalgo, Rose Phi, MD   HYDROcodone-acetaminophen (NORCO/VICODIN) 5-325 MG per tablet 1 tablet, 1 tablet, Oral, Once, Erenest Blank, NP   sodium chloride flush (NS) 0.9 % injection 10 mL, 10 mL, Intravenous, Once, Neyda Durango, Rose Phi, MD   sodium chloride flush (NS) 0.9 % injection 10 mL, 10 mL, Intracatheter, PRN, Josph Macho, MD  Allergies:  Allergies  Allergen Reactions   Percocet [Oxycodone-Acetaminophen] Nausea And Vomiting   Oxycodone Other (See Comments)   Oxycodone-Aspirin Nausea And Vomiting    Past Medical History, Surgical history, Social history, and Family History were reviewed and updated.  Review of Systems: Review of Systems  Constitutional:  Positive for fatigue.  HENT:  Negative.    Eyes: Negative.   Respiratory: Negative.    Cardiovascular: Negative.   Gastrointestinal:   Positive for nausea.  Endocrine: Negative.   Genitourinary: Negative.    Musculoskeletal: Negative.   Skin: Negative.   Neurological: Negative.   Hematological: Negative.   Psychiatric/Behavioral: Negative.      Physical Exam:  height is 5\' 9"  (1.753 m) and weight is 145 lb (65.8 kg). His oral temperature is 98.7 F (37.1 C). His blood pressure is 146/63 (abnormal) and his pulse is 74. His respiration is 16 and oxygen saturation is 100%.   Wt Readings from Last 3 Encounters:  10/21/22 145 lb (65.8 kg)  10/19/22 145 lb (65.8 kg)  10/04/22 145 lb (65.8 kg)    Physical Exam Vitals reviewed.  HENT:     Head: Normocephalic and atraumatic.  Eyes:  Pupils: Pupils are equal, round, and reactive to light.  Cardiovascular:     Rate and Rhythm: Normal rate and regular rhythm.     Heart sounds: Normal heart sounds.  Pulmonary:     Effort: Pulmonary effort is normal.     Breath sounds: Normal breath sounds.  Abdominal:     General: Bowel sounds are normal.     Palpations: Abdomen is soft.  Musculoskeletal:        General: No tenderness or deformity. Normal range of motion.     Cervical back: Normal range of motion.  Lymphadenopathy:     Cervical: No cervical adenopathy.  Skin:    General: Skin is warm and dry.     Findings: No erythema or rash.  Neurological:     Mental Status: He is alert and oriented to person, place, and time.  Psychiatric:        Behavior: Behavior normal.        Thought Content: Thought content normal.        Judgment: Judgment normal.    Lab Results  Component Value Date   WBC 10.0 10/21/2022   HGB 11.3 (L) 10/21/2022   HCT 35.9 (L) 10/21/2022   MCV 89.8 10/21/2022   PLT 265 10/21/2022     Chemistry      Component Value Date/Time   NA 136 10/21/2022 1132   NA 139 07/23/2020 0818   K 4.2 10/21/2022 1132   CL 102 10/21/2022 1132   CO2 31 10/21/2022 1132   BUN 22 10/21/2022 1132   BUN 10 07/23/2020 0818   CREATININE 0.87 10/21/2022 1132    CREATININE 1.06 06/27/2015 0837      Component Value Date/Time   CALCIUM 9.3 10/21/2022 1132   ALKPHOS 87 10/21/2022 1132   AST 17 10/21/2022 1132   ALT 20 10/21/2022 1132   BILITOT 0.5 10/21/2022 1132      Impression and Plan: Mr. Nolazco is a very nice 82 year old white male.  He has what I hope to believe is localized gastric cancer.   I just feel bad that he is having these issues.  Again he does not have any obvious bleeding at least from the negative stool.  His hemoglobin is holding steady.  Hopefully, given a dose of Emend will help.  I will also see if Ativan may help.  I would like to try him on some olanzapine at home.  Again I am not sure if is able to take much by mouth.  We probably will have to get him in tomorrow for some more IV fluids and some IV anti-nausea medicine.  We clearly like to make another dose reduction when we do chemotherapy next.  Josph Macho, MD 5/16/20242:07 PM

## 2022-10-26 ENCOUNTER — Other Ambulatory Visit: Payer: Self-pay | Admitting: Hematology & Oncology

## 2022-10-26 ENCOUNTER — Encounter: Payer: Self-pay | Admitting: *Deleted

## 2022-10-26 ENCOUNTER — Other Ambulatory Visit: Payer: Self-pay | Admitting: *Deleted

## 2022-10-26 DIAGNOSIS — C160 Malignant neoplasm of cardia: Secondary | ICD-10-CM

## 2022-10-26 MED ORDER — MORPHINE SULFATE 15 MG PO TABS
15.0000 mg | ORAL_TABLET | Freq: Four times a day (QID) | ORAL | 0 refills | Status: AC | PRN
Start: 2022-10-26 — End: 2022-11-25

## 2022-10-27 ENCOUNTER — Inpatient Hospital Stay: Payer: Medicare HMO

## 2022-10-27 VITALS — BP 142/62 | HR 76 | Temp 98.6°F | Resp 17

## 2022-10-27 DIAGNOSIS — R1110 Vomiting, unspecified: Secondary | ICD-10-CM | POA: Diagnosis not present

## 2022-10-27 DIAGNOSIS — Z79620 Long term (current) use of immunosuppressive biologic: Secondary | ICD-10-CM | POA: Diagnosis not present

## 2022-10-27 DIAGNOSIS — C169 Malignant neoplasm of stomach, unspecified: Secondary | ICD-10-CM | POA: Diagnosis not present

## 2022-10-27 DIAGNOSIS — C160 Malignant neoplasm of cardia: Secondary | ICD-10-CM

## 2022-10-27 DIAGNOSIS — Z5189 Encounter for other specified aftercare: Secondary | ICD-10-CM | POA: Diagnosis not present

## 2022-10-27 DIAGNOSIS — Z5112 Encounter for antineoplastic immunotherapy: Secondary | ICD-10-CM | POA: Diagnosis not present

## 2022-10-27 DIAGNOSIS — E860 Dehydration: Secondary | ICD-10-CM

## 2022-10-27 DIAGNOSIS — Z5111 Encounter for antineoplastic chemotherapy: Secondary | ICD-10-CM | POA: Diagnosis not present

## 2022-10-27 LAB — CBC WITH DIFFERENTIAL (CANCER CENTER ONLY)
Abs Immature Granulocytes: 0.05 10*3/uL (ref 0.00–0.07)
Basophils Absolute: 0 10*3/uL (ref 0.0–0.1)
Basophils Relative: 0 %
Eosinophils Absolute: 0.1 10*3/uL (ref 0.0–0.5)
Eosinophils Relative: 2 %
HCT: 37.2 % — ABNORMAL LOW (ref 39.0–52.0)
Hemoglobin: 11.9 g/dL — ABNORMAL LOW (ref 13.0–17.0)
Immature Granulocytes: 1 %
Lymphocytes Relative: 8 %
Lymphs Abs: 0.6 10*3/uL — ABNORMAL LOW (ref 0.7–4.0)
MCH: 28.3 pg (ref 26.0–34.0)
MCHC: 32 g/dL (ref 30.0–36.0)
MCV: 88.6 fL (ref 80.0–100.0)
Monocytes Absolute: 0.4 10*3/uL (ref 0.1–1.0)
Monocytes Relative: 5 %
Neutro Abs: 6.9 10*3/uL (ref 1.7–7.7)
Neutrophils Relative %: 84 %
Platelet Count: 312 10*3/uL (ref 150–400)
RBC: 4.2 MIL/uL — ABNORMAL LOW (ref 4.22–5.81)
RDW: 13.8 % (ref 11.5–15.5)
WBC Count: 8 10*3/uL (ref 4.0–10.5)
nRBC: 0 % (ref 0.0–0.2)

## 2022-10-27 LAB — COMPREHENSIVE METABOLIC PANEL
ALT: 24 U/L (ref 0–44)
AST: 16 U/L (ref 15–41)
Albumin: 4 g/dL (ref 3.5–5.0)
Alkaline Phosphatase: 94 U/L (ref 38–126)
Anion gap: 8 (ref 5–15)
BUN: 17 mg/dL (ref 8–23)
CO2: 27 mmol/L (ref 22–32)
Calcium: 9.5 mg/dL (ref 8.9–10.3)
Chloride: 102 mmol/L (ref 98–111)
Creatinine, Ser: 0.82 mg/dL (ref 0.61–1.24)
GFR, Estimated: >60 mL/min (ref >60)
Glucose, Bld: 147 mg/dL — ABNORMAL HIGH (ref 70–99)
Potassium: 3.8 mmol/L (ref 3.5–5.1)
Sodium: 137 mmol/L (ref 135–145)
Total Bilirubin: 0.3 mg/dL (ref 0.3–1.2)
Total Protein: 6.4 g/dL — ABNORMAL LOW (ref 6.5–8.1)

## 2022-10-27 MED ORDER — SODIUM CHLORIDE 0.9 % IV SOLN
16.0000 mg | Freq: Once | INTRAVENOUS | Status: AC
  Administered 2022-10-27: 16 mg via INTRAVENOUS
  Filled 2022-10-27: qty 8

## 2022-10-27 MED ORDER — SODIUM CHLORIDE 0.9% FLUSH
10.0000 mL | Freq: Once | INTRAVENOUS | Status: AC
  Administered 2022-10-27: 10 mL

## 2022-10-27 MED ORDER — HEPARIN SOD (PORK) LOCK FLUSH 100 UNIT/ML IV SOLN
250.0000 [IU] | Freq: Once | INTRAVENOUS | Status: AC
  Administered 2022-10-27: 250 [IU] via INTRAVENOUS

## 2022-10-27 MED ORDER — ALTEPLASE 2 MG IJ SOLR: 2.0000 mg | Freq: Once | INTRAMUSCULAR | Status: DC

## 2022-10-27 MED ORDER — SODIUM CHLORIDE 0.9 % IV SOLN
20.0000 mg | Freq: Once | INTRAVENOUS | Status: AC
  Administered 2022-10-27: 20 mg via INTRAVENOUS
  Filled 2022-10-27: qty 20

## 2022-10-27 MED ORDER — ALTEPLASE 2 MG IJ SOLR
2.0000 mg | Freq: Once | INTRAMUSCULAR | Status: DC
  Filled 2022-10-27: qty 2

## 2022-10-27 MED ORDER — SODIUM CHLORIDE 0.9 % IV SOLN: INTRAVENOUS | Status: DC

## 2022-10-27 MED ORDER — ALTEPLASE 2 MG IJ SOLR
2.0000 mg | Freq: Once | INTRAMUSCULAR | Status: AC
  Administered 2022-10-27: 2 mg

## 2022-10-27 NOTE — Progress Notes (Signed)
Patient presents today for IV fluids, Labs and PICC line dressing change. PICC line dressing is dry, intact and noted to have old bloody drainage. Both lumens flushed with normal saline flushes. Blood return was not noted to either lumen. Caps changed and flushed with saline with no blood return. Patient denies tenderness, redness or pain to the PICC site. IV started to obtain labs and proceed with IV fluids. Patient verbalized understanding. Cath Flo administered through distal lumen.   1337 Positive blood return noted on the proximal and distal PICC lumen. Both lines flushed with normal saline and heparin locked.   Patient complains of nausea. Patient states he forgot to take his zofran this morning. Patient educated regarding taking nausea medications at home to stay ahead of the nausea. Dr. Myna Hidalgo notified. Order given and carried out for zofan 16 mg IVPB and Dexamethasone 20 mg IVPB. Patient verbalzied understanding.

## 2022-10-29 ENCOUNTER — Encounter: Payer: Self-pay | Admitting: Hematology & Oncology

## 2022-10-29 ENCOUNTER — Telehealth: Payer: Self-pay

## 2022-10-29 NOTE — Telephone Encounter (Signed)
Received phone call from patient daughter George Dyer stating her father is not doing well. Per George Dyer patient is having extreme fatigue and unable to sit up in the bed or get out of the bed. Per George Dyer, patient is still having diarrhea even though pt has been taking imodium as prescribed. George Dyer states patient is saying he feels worse than when he was taking chemotherapy and radiation. Reviewed pt symptoms with Dr. Myna Hidalgo and the recommendation is that they take the patient to the ER. George Dyer stated her dad is stubborn and may not want to go. This RN stated that he does need to be evaluated and if necessary to call 911. Reviewed ERs to go to with George Dyer including [facility] vs [facility] as he can't be admitted at [facility] if that is necessary. Reviewed next appointment date and time with [facility]. Reviewed that the office is closed for [facility] George Dyer Day and if patient is as fatigued and having diarrhea it is necessary for patient to be evaluated immediately.

## 2022-11-02 ENCOUNTER — Inpatient Hospital Stay: Payer: Medicare HMO

## 2022-11-02 ENCOUNTER — Ambulatory Visit: Payer: Medicare HMO | Admitting: Physical Medicine and Rehabilitation

## 2022-11-02 ENCOUNTER — Inpatient Hospital Stay: Payer: Medicare HMO | Admitting: Hematology & Oncology

## 2022-11-02 ENCOUNTER — Other Ambulatory Visit: Payer: Self-pay

## 2022-11-02 ENCOUNTER — Encounter: Payer: Self-pay | Admitting: Hematology & Oncology

## 2022-11-02 VITALS — BP 155/68 | HR 71 | Resp 17

## 2022-11-02 VITALS — BP 137/66 | HR 73 | Temp 97.8°F | Resp 18 | Ht 69.0 in | Wt 142.0 lb

## 2022-11-02 DIAGNOSIS — Z452 Encounter for adjustment and management of vascular access device: Secondary | ICD-10-CM

## 2022-11-02 DIAGNOSIS — C160 Malignant neoplasm of cardia: Secondary | ICD-10-CM

## 2022-11-02 DIAGNOSIS — Z79620 Long term (current) use of immunosuppressive biologic: Secondary | ICD-10-CM | POA: Diagnosis not present

## 2022-11-02 DIAGNOSIS — Z5112 Encounter for antineoplastic immunotherapy: Secondary | ICD-10-CM | POA: Diagnosis not present

## 2022-11-02 DIAGNOSIS — Z5189 Encounter for other specified aftercare: Secondary | ICD-10-CM | POA: Diagnosis not present

## 2022-11-02 DIAGNOSIS — Z5111 Encounter for antineoplastic chemotherapy: Secondary | ICD-10-CM | POA: Diagnosis not present

## 2022-11-02 DIAGNOSIS — C169 Malignant neoplasm of stomach, unspecified: Secondary | ICD-10-CM | POA: Diagnosis not present

## 2022-11-02 DIAGNOSIS — R1110 Vomiting, unspecified: Secondary | ICD-10-CM | POA: Diagnosis not present

## 2022-11-02 LAB — CMP (CANCER CENTER ONLY)
ALT: 20 U/L (ref 0–44)
AST: 12 U/L — ABNORMAL LOW (ref 15–41)
Albumin: 4.2 g/dL (ref 3.5–5.0)
Alkaline Phosphatase: 103 U/L (ref 38–126)
Anion gap: 4 — ABNORMAL LOW (ref 5–15)
BUN: 16 mg/dL (ref 8–23)
CO2: 30 mmol/L (ref 22–32)
Calcium: 9.7 mg/dL (ref 8.9–10.3)
Chloride: 100 mmol/L (ref 98–111)
Creatinine: 0.96 mg/dL (ref 0.61–1.24)
GFR, Estimated: >60 mL/min (ref >60)
Glucose, Bld: 107 mg/dL — ABNORMAL HIGH (ref 70–99)
Potassium: 4.4 mmol/L (ref 3.5–5.1)
Sodium: 134 mmol/L — ABNORMAL LOW (ref 135–145)
Total Bilirubin: 0.5 mg/dL (ref 0.3–1.2)
Total Protein: 6.4 g/dL — ABNORMAL LOW (ref 6.5–8.1)

## 2022-11-02 LAB — CBC WITH DIFFERENTIAL (CANCER CENTER ONLY)
Abs Immature Granulocytes: 0.02 10*3/uL (ref 0.00–0.07)
Basophils Absolute: 0.1 10*3/uL (ref 0.0–0.1)
Basophils Relative: 1 %
Eosinophils Absolute: 0.3 10*3/uL (ref 0.0–0.5)
Eosinophils Relative: 6 %
HCT: 38.5 % — ABNORMAL LOW (ref 39.0–52.0)
Hemoglobin: 12.5 g/dL — ABNORMAL LOW (ref 13.0–17.0)
Immature Granulocytes: 0 %
Lymphocytes Relative: 19 %
Lymphs Abs: 1.1 10*3/uL (ref 0.7–4.0)
MCH: 29 pg (ref 26.0–34.0)
MCHC: 32.5 g/dL (ref 30.0–36.0)
MCV: 89.3 fL (ref 80.0–100.0)
Monocytes Absolute: 0.6 10*3/uL (ref 0.1–1.0)
Monocytes Relative: 10 %
Neutro Abs: 3.5 10*3/uL (ref 1.7–7.7)
Neutrophils Relative %: 64 %
Platelet Count: 354 10*3/uL (ref 150–400)
RBC: 4.31 MIL/uL (ref 4.22–5.81)
RDW: 14.2 % (ref 11.5–15.5)
WBC Count: 5.6 10*3/uL (ref 4.0–10.5)
nRBC: 0 % (ref 0.0–0.2)

## 2022-11-02 LAB — LACTATE DEHYDROGENASE: LDH: 100 U/L (ref 98–192)

## 2022-11-02 MED ORDER — SODIUM CHLORIDE 0.9 % IV SOLN
240.0000 mg | INTRAVENOUS | Status: DC
  Administered 2022-11-02: 240 mg via INTRAVENOUS
  Filled 2022-11-02: qty 24

## 2022-11-02 MED ORDER — HEPARIN SOD (PORK) LOCK FLUSH 100 UNIT/ML IV SOLN
250.0000 [IU] | Freq: Once | INTRAVENOUS | Status: AC
  Administered 2022-11-02: 250 [IU] via INTRAVENOUS

## 2022-11-02 MED ORDER — HEPARIN SOD (PORK) LOCK FLUSH 100 UNIT/ML IV SOLN
500.0000 [IU] | Freq: Once | INTRAVENOUS | Status: AC
  Administered 2022-11-02: 250 [IU] via INTRAVENOUS

## 2022-11-02 MED ORDER — SODIUM CHLORIDE 0.9 % IV SOLN: Freq: Once | INTRAVENOUS | Status: AC

## 2022-11-02 MED ORDER — SODIUM CHLORIDE 0.9% FLUSH
10.0000 mL | Freq: Once | INTRAVENOUS | Status: AC
  Administered 2022-11-02: 10 mL via INTRAVENOUS

## 2022-11-02 NOTE — Progress Notes (Signed)
Ma'am yes no surgery yes ma'am good Hematology and Oncology Follow Up Visit  George Dyer [id number] 1940-08-21 82 y.o. 11/02/2022   Principle Diagnosis:  Stage IIB (T3N0M0) Gastric cancer -- declines surgery -recurrent-  HER2(-)/ BRAF (+)  Current Therapy:   FOLFOX - s/p cycle #4-  start on 09/15/2021 XRT/Xeloda -- start on 12/07/2021 --completed on 01/12/2022 FOLFIRI/Opdivo -- start cycle #1 on 10/19/2022 -- d/c FOLFIRI on 11/02/2022     Interim History:  George Dyer is back for follow-up.  Unfortunate, he had a horrible time with chemotherapy.  I think we would have to DC the FOLFIRI.  I would just keep him on the Opdivo.  I would think that he should tolerate the Opdivo better.  Again he does have a horrible time with chemotherapy.  Even with a reduced dose, he had difficulties.  He is not eating.  He is having diarrhea.  He was very weak.  He had come in several times for IV fluid.  There is been no fever.  He has had no bleeding.  There is no abdominal pain right now.  Overall, I would have said his performance status right now is probably ECOG 1-2.      Medications:  Current Outpatient Medications:    acetaminophen (TYLENOL) 325 MG tablet, Take 650 mg by mouth 2 (two) times daily as needed., Disp: , Rfl:    alum & mag hydroxide-simeth (MAALOX/MYLANTA) 200-200-20 MG/5ML suspension, Take 30 mLs by mouth daily., Disp: , Rfl:    dexamethasone (DECADRON) 4 MG tablet, Take 2 tablets (8 mg total) by mouth daily. Start the day after chemotherapy for 2 days. Take with food., Disp: 8 tablet, Rfl: 5   dronabinol (MARINOL) 2.5 MG capsule, Take 1 capsule (2.5 mg total) by mouth 2 (two) times daily before a meal. (Patient not taking: Reported on 10/19/2022), Disp: 60 capsule, Rfl: 0   DULoxetine (CYMBALTA) 30 MG capsule, Take 2 capsules (60 mg total) by mouth at bedtime. Take 1 capsule (30 mg) for the 1st week, then increase to 2 capsules thereafter. (Patient not taking: Reported on  10/19/2022), Disp: 60 capsule, Rfl: 3   Heparin Na, Pork, Lock Flsh PF 100 UNIT/ML SOLN, Flush each PICC lumen with 2.5 mLs (250 Units total) as directed every Monday, Wednesday, and Friday., Disp: 120 mL, Rfl: 1   lactulose (CHRONULAC) 10 GM/15ML solution, Take 30 mLs (20 g total) by mouth 3 (three) times daily., Disp: 1000 mL, Rfl: 4   levothyroxine (SYNTHROID) 75 MCG tablet, Take 1 tablet (75 mcg total) by mouth daily before breakfast., Disp: 30 tablet, Rfl: 4   loperamide (IMODIUM) 2 MG capsule, Take 2 tabs by mouth with first loose stool, then 1 tab with each additional loose stool as needed. Do not exceed 8 tabs in a 24-hour period, Disp: 100 capsule, Rfl: 3   megestrol (MEGACE ES) 625 MG/5ML suspension, Take 5 mLs (625 mg total) by mouth daily. (Patient not taking: Reported on 10/19/2022), Disp: 150 mL, Rfl: 4   megestrol (MEGACE) 400 MG/10ML suspension, Take 10 mLs (400 mg total) by mouth 2 (two) times daily. (Patient not taking: Reported on 10/19/2022), Disp: 240 mL, Rfl: 0   metFORMIN (GLUCOPHAGE) 500 MG tablet, Take 1 tablet (500 mg total) by mouth daily with breakfast. (Patient not taking: Reported on 10/19/2022), Disp: 90 tablet, Rfl: 3   morphine (MSIR) 15 MG tablet, Take 1 tablet (15 mg total) by mouth every 6 (six) hours as needed for severe pain., Disp: 120  tablet, Rfl: 0   omeprazole (PRILOSEC) 40 MG capsule, TAKE 1 CAPSULE (40 MG TOTAL) BY MOUTH DAILY., Disp: 90 capsule, Rfl: 1   ondansetron (ZOFRAN) 8 MG tablet, Take 1 tablet (8 mg total) by mouth every 8 (eight) hours as needed for nausea, vomiting or refractory nausea / vomiting. Start on the third day after chemotherapy., Disp: 30 tablet, Rfl: 1   polyethylene glycol (MIRALAX / GLYCOLAX) 17 g packet, Take 17 g by mouth daily., Disp: , Rfl:    prochlorperazine (COMPAZINE) 10 MG tablet, Take 1 tablet (10 mg total) by mouth every 6 (six) hours as needed for nausea or vomiting., Disp: 30 tablet, Rfl: 1   sodium chloride flush (NS) 0.9 %  SOLN, Inject 10 mLs into each port 3 (three) times a week. Flush each port of PICC Monday, Wednesday and Friday., Disp: 240 mL, Rfl: 1   temazepam (RESTORIL) 7.5 MG capsule, Take 1 capsule (7.5 mg total) by mouth at bedtime as needed for sleep. (Patient not taking: Reported on 10/19/2022), Disp: 30 capsule, Rfl: 0  Current Facility-Administered Medications:    alteplase (CATHFLO ACTIVASE) injection 2 mg, 2 mg, Intracatheter, Once, Gil Ingwersen, Rose Phi, MD   alteplase (CATHFLO ACTIVASE) injection 2 mg, 2 mg, Intracatheter, Once, Miguel Christiana, Rose Phi, MD  Facility-Administered Medications Ordered in Other Visits:    heparin lock flush 100 unit/mL, 500 Units, Intravenous, Once, Paizlie Klaus, Rose Phi, MD   heparin lock flush 100 unit/mL, 250 Units, Intravenous, Once, Ja Pistole, Rose Phi, MD   HYDROcodone-acetaminophen (NORCO/VICODIN) 5-325 MG per tablet 1 tablet, 1 tablet, Oral, Once, Erenest Blank, NP   sodium chloride flush (NS) 0.9 % injection 10 mL, 10 mL, Intravenous, Once, Jamesetta Greenhalgh, Rose Phi, MD  Allergies:  Allergies  Allergen Reactions   Percocet [Oxycodone-Acetaminophen] Nausea And Vomiting   Oxycodone Other (See Comments)   Oxycodone-Aspirin Nausea And Vomiting    Past Medical History, Surgical history, Social history, and Family History were reviewed and updated.  Review of Systems: Review of Systems  Constitutional:  Positive for fatigue.  HENT:  Negative.    Eyes: Negative.   Respiratory: Negative.    Cardiovascular: Negative.   Gastrointestinal:  Positive for nausea.  Endocrine: Negative.   Genitourinary: Negative.    Musculoskeletal: Negative.   Skin: Negative.   Neurological: Negative.   Hematological: Negative.   Psychiatric/Behavioral: Negative.      Physical Exam:  height is 5\' 9"  (1.753 m) and weight is 142 lb (64.4 kg). His oral temperature is 97.8 F (36.6 C). His blood pressure is 137/66 and his pulse is 73. His respiration is 18 and oxygen saturation is 99%.   Wt  Readings from Last 3 Encounters:  11/02/22 142 lb (64.4 kg)  10/21/22 145 lb (65.8 kg)  10/19/22 145 lb (65.8 kg)    Physical Exam Vitals reviewed.  HENT:     Head: Normocephalic and atraumatic.  Eyes:     Pupils: Pupils are equal, round, and reactive to light.  Cardiovascular:     Rate and Rhythm: Normal rate and regular rhythm.     Heart sounds: Normal heart sounds.  Pulmonary:     Effort: Pulmonary effort is normal.     Breath sounds: Normal breath sounds.  Abdominal:     General: Bowel sounds are normal.     Palpations: Abdomen is soft.  Musculoskeletal:        General: No tenderness or deformity. Normal range of motion.     Cervical back: Normal range of motion.  Lymphadenopathy:     Cervical: No cervical adenopathy.  Skin:    General: Skin is warm and dry.     Findings: No erythema or rash.  Neurological:     Mental Status: He is alert and oriented to person, place, and time.  Psychiatric:        Behavior: Behavior normal.        Thought Content: Thought content normal.        Judgment: Judgment normal.    Lab Results  Component Value Date   WBC 5.6 11/02/2022   HGB 12.5 (L) 11/02/2022   HCT 38.5 (L) 11/02/2022   MCV 89.3 11/02/2022   PLT 354 11/02/2022     Chemistry      Component Value Date/Time   NA 134 (L) 11/02/2022 0902   NA 139 07/23/2020 0818   K 4.4 11/02/2022 0902   CL 100 11/02/2022 0902   CO2 30 11/02/2022 0902   BUN 16 11/02/2022 0902   BUN 10 07/23/2020 0818   CREATININE 0.96 11/02/2022 0902   CREATININE 1.06 06/27/2015 0837      Component Value Date/Time   CALCIUM 9.7 11/02/2022 0902   ALKPHOS 103 11/02/2022 0902   AST 12 (L) 11/02/2022 0902   ALT 20 11/02/2022 0902   BILITOT 0.5 11/02/2022 0902      Impression and Plan: George Dyer is a very nice 82 year old white male.  He has what I hoped to believe is localized gastric cancer.   Hopefully, he will do okay just with Opdivo.  I would like to think that this will be helpful  for him.  I probably give him 2 cycles of Opdivo and then repeat his PET scan.  Provide just want his quality of life to be better.  He definitely deserves that.  We will plan to get him back in 2 more weeks.   Josph Macho, MD 5/28/202410:16 AM

## 2022-11-02 NOTE — Patient Instructions (Addendum)
[facility]  Discharge Instructions: Thank you for choosing [facility] to provide your oncology and hematology care.   If you have a lab appointment with the Cancer Center, please go directly to the Cancer Center and check in at the registration area.  Wear comfortable clothing and clothing appropriate for easy access to any Portacath or PICC line.   We strive to give you quality time with your provider. You may need to reschedule your appointment if you arrive late (15 or more minutes).  Arriving late affects you and other patients whose appointments are after yours.  Also, if you miss three or more appointments without notifying the office, you may be dismissed from the clinic at the provider's discretion.      For prescription refill requests, have your pharmacy contact our office and allow 72 hours for refills to be completed.    Today you received the following chemotherapy and/or immunotherapy agents:  Opdivo      To help prevent nausea and vomiting after your treatment, we encourage you to take your nausea medication as directed.  BELOW ARE SYMPTOMS THAT SHOULD BE REPORTED IMMEDIATELY: *FEVER GREATER THAN 100.4 F (38 C) OR HIGHER *CHILLS OR SWEATING *NAUSEA AND VOMITING THAT IS NOT CONTROLLED WITH YOUR NAUSEA MEDICATION *UNUSUAL SHORTNESS OF BREATH *UNUSUAL BRUISING OR BLEEDING *URINARY PROBLEMS (pain or burning when urinating, or frequent urination) *BOWEL PROBLEMS (unusual diarrhea, constipation, pain near the anus) TENDERNESS IN MOUTH AND THROAT WITH OR WITHOUT PRESENCE OF ULCERS (sore throat, sores in mouth, or a toothache) UNUSUAL RASH, SWELLING OR PAIN  UNUSUAL VAGINAL DISCHARGE OR ITCHING   Items with * indicate a potential emergency and should be followed up as soon as possible or go to the Emergency Department if any problems should occur.  Please show the CHEMOTHERAPY ALERT CARD or IMMUNOTHERAPY ALERT CARD at check-in  to the Emergency Department and triage nurse. Should you have questions after your visit or need to cancel or reschedule your appointment, please contact [facility]  [phone] and follow the prompts.  Office hours are 8:00 a.m. to 4:30 p.m. Monday - Friday. Please note that voicemails left after 4:00 p.m. may not be returned until the following business day.  We are closed weekends and major holidays. You have access to a nurse at all times for urgent questions. Please call the main number to the clinic [phone] and follow the prompts.  For any non-urgent questions, you may also contact your provider using MyChart. We now offer e-Visits for anyone 61 and older to request care online for non-urgent symptoms. For details visit [email or web address].   Also download the MyChart app! Go to the app store, search "MyChart", open the app, select [facility], and log in with your MyChart username and password.  Nivolumab Injection What is this medication? NIVOLUMAB (nye VOL ue mab) treats some types of cancer. It works by helping your immune system slow or stop the spread of cancer cells. It is a monoclonal antibody. This medicine may be used for other purposes; ask your health care provider or pharmacist if you have questions. COMMON BRAND NAME(S): Opdivo What should I tell my care team before I take this medication? They need to know if you have any of these conditions: Allogeneic stem cell transplant (uses someone else's stem cells) Autoimmune diseases, such as Crohn disease, ulcerative colitis, lupus History of chest radiation Nervous system problems, such as Guillain-Barre syndrome  or myasthenia gravis Organ transplant An unusual or allergic reaction to nivolumab, other medications, foods, dyes, or preservatives Pregnant or trying to get pregnant Breast-feeding How should I use this medication? This medication is infused into a vein. It is given in a  hospital or clinic setting. A special MedGuide will be given to you before each treatment. Be sure to read this information carefully each time. Talk to your care team about the use of this medication in children. While it may be prescribed for children as young as 12 years for selected conditions, precautions do apply. Overdosage: If you think you have taken too much of this medicine contact a poison control center or emergency room at once. NOTE: This medicine is only for you. Do not share this medicine with others. What if I miss a dose? Keep appointments for follow-up doses. It is important not to miss your dose. Call your care team if you are unable to keep an appointment. What may interact with this medication? Interactions have not been studied. This list may not describe all possible interactions. Give your health care provider a list of all the medicines, herbs, non-prescription drugs, or dietary supplements you use. Also tell them if you smoke, drink alcohol, or use illegal drugs. Some items may interact with your medicine. What should I watch for while using this medication? Your condition will be monitored carefully while you are receiving this medication. You may need blood work while taking this medication. This medication may cause serious skin reactions. They can happen weeks to months after starting the medication. Contact your care team right away if you notice fevers or flu-like symptoms with a rash. The rash may be red or purple and then turn into blisters or peeling of the skin. You may also notice a red rash with swelling of the face, lips, or lymph nodes in your neck or under your arms. Tell your care team right away if you have any change in your eyesight. Talk to your care team if you are pregnant or think you might be pregnant. A negative pregnancy test is required before starting this medication. A reliable form of contraception is recommended while taking this medication and  for 5 months after the last dose. Talk to your care team about effective forms of contraception. Do not breast-feed while taking this medication and for 5 months after the last dose. What side effects may I notice from receiving this medication? Side effects that you should report to your care team as soon as possible: Allergic reactions--skin rash, itching, hives, swelling of the face, lips, tongue, or throat Dry cough, shortness of breath or trouble breathing Eye pain, redness, irritation, or discharge with blurry or decreased vision Heart muscle inflammation--unusual weakness or fatigue, shortness of breath, chest pain, fast or irregular heartbeat, dizziness, swelling of the ankles, feet, or hands Hormone gland problems--headache, sensitivity to light, unusual weakness or fatigue, dizziness, fast or irregular heartbeat, increased sensitivity to cold or heat, excessive sweating, constipation, hair loss, increased thirst or amount of urine, tremors or shaking, irritability Infusion reactions--chest pain, shortness of breath or trouble breathing, feeling faint or lightheaded Kidney injury (glomerulonephritis)--decrease in the amount of urine, red or dark brown urine, foamy or bubbly urine, swelling of the ankles, hands, or feet Liver injury--right upper belly pain, loss of appetite, nausea, light-colored stool, dark yellow or brown urine, yellowing skin or eyes, unusual weakness or fatigue Pain, tingling, or numbness in the hands or feet, muscle weakness, change in vision,  confusion or trouble speaking, loss of balance or coordination, trouble walking, seizures Rash, fever, and swollen lymph nodes Redness, blistering, peeling, or loosening of the skin, including inside the mouth Sudden or severe stomach pain, bloody diarrhea, fever, nausea, vomiting Side effects that usually do not require medical attention (report these to your care team if they continue or are bothersome): Bone, joint, or muscle  pain Diarrhea Fatigue Loss of appetite Nausea Skin rash This list may not describe all possible side effects. Call your doctor for medical advice about side effects. You may report side effects to FDA at 1-800-FDA-1088. Where should I keep my medication? This medication is given in a hospital or clinic. It will not be stored at home. NOTE: This sheet is a summary. It may not cover all possible information. If you have questions about this medicine, talk to your doctor, pharmacist, or health care provider.  2024 Elsevier/Gold Standard (2021-09-21 00:00:00)

## 2022-11-08 NOTE — Progress Notes (Signed)
Patient is now on single agent Opdivo. Aloxi/Emend/dexamethasone/atropine removed from careplan per Dr. Gustavo Lah instructions.

## 2022-11-09 ENCOUNTER — Inpatient Hospital Stay: Payer: Medicare HMO | Attending: Hematology & Oncology

## 2022-11-09 VITALS — BP 136/70 | HR 81 | Temp 98.2°F | Resp 17

## 2022-11-09 DIAGNOSIS — Z452 Encounter for adjustment and management of vascular access device: Secondary | ICD-10-CM | POA: Insufficient documentation

## 2022-11-09 DIAGNOSIS — Z5112 Encounter for antineoplastic immunotherapy: Secondary | ICD-10-CM | POA: Diagnosis not present

## 2022-11-09 DIAGNOSIS — C169 Malignant neoplasm of stomach, unspecified: Secondary | ICD-10-CM | POA: Insufficient documentation

## 2022-11-09 MED ORDER — SODIUM CHLORIDE 0.9% FLUSH
10.0000 mL | Freq: Once | INTRAVENOUS | Status: AC
  Administered 2022-11-09: 10 mL via INTRAVENOUS

## 2022-11-09 MED ORDER — HEPARIN SOD (PORK) LOCK FLUSH 100 UNIT/ML IV SOLN
500.0000 [IU] | Freq: Once | INTRAVENOUS | Status: AC
  Administered 2022-11-09: 250 [IU] via INTRAVENOUS

## 2022-11-16 ENCOUNTER — Inpatient Hospital Stay: Payer: Medicare HMO

## 2022-11-16 ENCOUNTER — Inpatient Hospital Stay: Payer: Medicare HMO | Admitting: Hematology & Oncology

## 2022-11-16 ENCOUNTER — Other Ambulatory Visit: Payer: Self-pay

## 2022-11-16 VITALS — BP 128/73 | HR 71 | Temp 98.3°F | Resp 18 | Ht 69.0 in | Wt 143.0 lb

## 2022-11-16 VITALS — BP 130/73 | HR 72 | Temp 98.2°F | Resp 18

## 2022-11-16 DIAGNOSIS — Z452 Encounter for adjustment and management of vascular access device: Secondary | ICD-10-CM

## 2022-11-16 DIAGNOSIS — C160 Malignant neoplasm of cardia: Secondary | ICD-10-CM

## 2022-11-16 DIAGNOSIS — Z5112 Encounter for antineoplastic immunotherapy: Secondary | ICD-10-CM | POA: Diagnosis not present

## 2022-11-16 DIAGNOSIS — C169 Malignant neoplasm of stomach, unspecified: Secondary | ICD-10-CM | POA: Diagnosis not present

## 2022-11-16 LAB — CMP (CANCER CENTER ONLY)
ALT: 12 U/L (ref 0–44)
AST: 11 U/L — ABNORMAL LOW (ref 15–41)
Albumin: 4.1 g/dL (ref 3.5–5.0)
Alkaline Phosphatase: 95 U/L (ref 38–126)
Anion gap: 6 (ref 5–15)
BUN: 20 mg/dL (ref 8–23)
CO2: 30 mmol/L (ref 22–32)
Calcium: 9.5 mg/dL (ref 8.9–10.3)
Chloride: 101 mmol/L (ref 98–111)
Creatinine: 1.02 mg/dL (ref 0.61–1.24)
GFR, Estimated: >60 mL/min (ref >60)
Glucose, Bld: 94 mg/dL (ref 70–99)
Potassium: 4.1 mmol/L (ref 3.5–5.1)
Sodium: 137 mmol/L (ref 135–145)
Total Bilirubin: 0.5 mg/dL (ref 0.3–1.2)
Total Protein: 6.7 g/dL (ref 6.5–8.1)

## 2022-11-16 LAB — CBC WITH DIFFERENTIAL (CANCER CENTER ONLY)
Abs Immature Granulocytes: 0.03 10*3/uL (ref 0.00–0.07)
Basophils Absolute: 0.1 10*3/uL (ref 0.0–0.1)
Basophils Relative: 2 %
Eosinophils Absolute: 0.4 10*3/uL (ref 0.0–0.5)
Eosinophils Relative: 6 %
HCT: 38.7 % — ABNORMAL LOW (ref 39.0–52.0)
Hemoglobin: 12.5 g/dL — ABNORMAL LOW (ref 13.0–17.0)
Immature Granulocytes: 1 %
Lymphocytes Relative: 18 %
Lymphs Abs: 1.2 10*3/uL (ref 0.7–4.0)
MCH: 28.9 pg (ref 26.0–34.0)
MCHC: 32.3 g/dL (ref 30.0–36.0)
MCV: 89.6 fL (ref 80.0–100.0)
Monocytes Absolute: 0.8 10*3/uL (ref 0.1–1.0)
Monocytes Relative: 12 %
Neutro Abs: 4.2 10*3/uL (ref 1.7–7.7)
Neutrophils Relative %: 61 %
Platelet Count: 303 10*3/uL (ref 150–400)
RBC: 4.32 MIL/uL (ref 4.22–5.81)
RDW: 14.6 % (ref 11.5–15.5)
WBC Count: 6.7 10*3/uL (ref 4.0–10.5)
nRBC: 0 % (ref 0.0–0.2)

## 2022-11-16 LAB — LACTATE DEHYDROGENASE: LDH: 102 U/L (ref 98–192)

## 2022-11-16 MED ORDER — HEPARIN SOD (PORK) LOCK FLUSH 100 UNIT/ML IV SOLN
250.0000 [IU] | Freq: Once | INTRAVENOUS | Status: AC
  Administered 2022-11-16: 250 [IU] via INTRAVENOUS

## 2022-11-16 MED ORDER — SODIUM CHLORIDE 0.9 % IV SOLN: Freq: Once | INTRAVENOUS | Status: AC

## 2022-11-16 MED ORDER — SODIUM CHLORIDE 0.9% FLUSH
10.0000 mL | INTRAVENOUS | Status: DC | PRN
  Administered 2022-11-16 (×2): 10 mL

## 2022-11-16 MED ORDER — SODIUM CHLORIDE 0.9 % IV SOLN
240.0000 mg | INTRAVENOUS | Status: DC
  Administered 2022-11-16: 240 mg via INTRAVENOUS
  Filled 2022-11-16: qty 24

## 2022-11-16 MED ORDER — HEPARIN SOD (PORK) LOCK FLUSH 100 UNIT/ML IV SOLN
250.0000 [IU] | Freq: Once | INTRAVENOUS | Status: AC | PRN
  Administered 2022-11-16: 250 [IU]

## 2022-11-16 MED ORDER — SODIUM CHLORIDE 0.9% FLUSH: 10.0000 mL | Freq: Once | INTRAVENOUS | Status: DC

## 2022-11-16 NOTE — Patient Instructions (Signed)
[facility]  Discharge Instructions: Thank you for choosing [facility] to provide your oncology and hematology care.   If you have a lab appointment with the Cancer Center, please go directly to the Cancer Center and check in at the registration area.  Wear comfortable clothing and clothing appropriate for easy access to any Portacath or PICC line.   We strive to give you quality time with your provider. You may need to reschedule your appointment if you arrive late (15 or more minutes).  Arriving late affects you and other patients whose appointments are after yours.  Also, if you miss three or more appointments without notifying the office, you may be dismissed from the clinic at the provider's discretion.      For prescription refill requests, have your pharmacy contact our office and allow 72 hours for refills to be completed.    Today you received the following chemotherapy and/or immunotherapy agents opdivo       To help prevent nausea and vomiting after your treatment, we encourage you to take your nausea medication as directed.  BELOW ARE SYMPTOMS THAT SHOULD BE REPORTED IMMEDIATELY: *FEVER GREATER THAN 100.4 F (38 C) OR HIGHER *CHILLS OR SWEATING *NAUSEA AND VOMITING THAT IS NOT CONTROLLED WITH YOUR NAUSEA MEDICATION *UNUSUAL SHORTNESS OF BREATH *UNUSUAL BRUISING OR BLEEDING *URINARY PROBLEMS (pain or burning when urinating, or frequent urination) *BOWEL PROBLEMS (unusual diarrhea, constipation, pain near the anus) TENDERNESS IN MOUTH AND THROAT WITH OR WITHOUT PRESENCE OF ULCERS (sore throat, sores in mouth, or a toothache) UNUSUAL RASH, SWELLING OR PAIN  UNUSUAL VAGINAL DISCHARGE OR ITCHING   Items with * indicate a potential emergency and should be followed up as soon as possible or go to the Emergency Department if any problems should occur.  Please show the CHEMOTHERAPY ALERT CARD or IMMUNOTHERAPY ALERT CARD at check-in  to the Emergency Department and triage nurse. Should you have questions after your visit or need to cancel or reschedule your appointment, please contact [facility]  [phone] and follow the prompts.  Office hours are 8:00 a.m. to 4:30 p.m. Monday - Friday. Please note that voicemails left after 4:00 p.m. may not be returned until the following business day.  We are closed weekends and major holidays. You have access to a nurse at all times for urgent questions. Please call the main number to the clinic [phone] and follow the prompts.  For any non-urgent questions, you may also contact your provider using MyChart. We now offer e-Visits for anyone 18 and older to request care online for non-urgent symptoms. For details visit [email or web address].   Also download the MyChart app! Go to the app store, search "MyChart", open the app, select , and log in with your MyChart username and password.   

## 2022-11-16 NOTE — Patient Instructions (Signed)

## 2022-11-16 NOTE — Progress Notes (Signed)
Ma'am yes no surgery yes ma'am good Hematology and Oncology Follow Up Visit  George Dyer [id number] 03/04/1941 82 y.o. 11/16/2022   Principle Diagnosis:  Stage IIB (T3N0M0) Gastric cancer -- declines surgery -recurrent-  HER2(-)/ BRAF (+)  Current Therapy:   FOLFOX - s/p cycle #4-  start on 09/15/2021 XRT/Xeloda -- start on 12/07/2021 --completed on 01/12/2022 FOLFIRI/Opdivo -- st/p cycle #1 on 10/19/2022 -- d/c FOLFIRI on 11/02/2022     Interim History:  George Dyer is back for follow-up.  He is still having some issues.  The issue right now is his legs.  He has terrible neuropathy.  He has had neuropathy for quite a while.  He is on MSIR to try to help with the neuropathy.  He is having some abdominal pain.  Thankfully, his weight has not gone down.  He is eating maybe a little bit better.  He has had no problems with nausea or vomiting.  He has had no obvious change in bowel or bladder habits.  He has had no diarrhea.  We does have him on Opdivo right now.  Hopefully, we do his next PET scan, we will see that there is been some response.  There has been no bleeding.  He has had no mouth sores.  There is been no fever.  Overall, I would say that his performance status is probably ECOG 1-2.     Medications:  Current Outpatient Medications:    acetaminophen (TYLENOL) 325 MG tablet, Take 650 mg by mouth 2 (two) times daily as needed., Disp: , Rfl:    alum & mag hydroxide-simeth (MAALOX/MYLANTA) 200-200-20 MG/5ML suspension, Take 30 mLs by mouth daily., Disp: , Rfl:    dexamethasone (DECADRON) 4 MG tablet, Take 2 tablets (8 mg total) by mouth daily. Start the day after chemotherapy for 2 days. Take with food., Disp: 8 tablet, Rfl: 5   dronabinol (MARINOL) 2.5 MG capsule, Take 1 capsule (2.5 mg total) by mouth 2 (two) times daily before a meal. (Patient not taking: Reported on 10/19/2022), Disp: 60 capsule, Rfl: 0   DULoxetine (CYMBALTA) 30 MG capsule, Take 2 capsules (60 mg total)  by mouth at bedtime. Take 1 capsule (30 mg) for the 1st week, then increase to 2 capsules thereafter. (Patient not taking: Reported on 10/19/2022), Disp: 60 capsule, Rfl: 3   Heparin Na, Pork, Lock Flsh PF 100 UNIT/ML SOLN, Flush each PICC lumen with 2.5 mLs (250 Units total) as directed every Monday, Wednesday, and Friday., Disp: 120 mL, Rfl: 1   lactulose (CHRONULAC) 10 GM/15ML solution, Take 30 mLs (20 g total) by mouth 3 (three) times daily., Disp: 1000 mL, Rfl: 4   levothyroxine (SYNTHROID) 75 MCG tablet, Take 1 tablet (75 mcg total) by mouth daily before breakfast., Disp: 30 tablet, Rfl: 4   loperamide (IMODIUM) 2 MG capsule, Take 2 tabs by mouth with first loose stool, then 1 tab with each additional loose stool as needed. Do not exceed 8 tabs in a 24-hour period, Disp: 100 capsule, Rfl: 3   megestrol (MEGACE ES) 625 MG/5ML suspension, Take 5 mLs (625 mg total) by mouth daily. (Patient not taking: Reported on 10/19/2022), Disp: 150 mL, Rfl: 4   megestrol (MEGACE) 400 MG/10ML suspension, Take 10 mLs (400 mg total) by mouth 2 (two) times daily. (Patient not taking: Reported on 10/19/2022), Disp: 240 mL, Rfl: 0   metFORMIN (GLUCOPHAGE) 500 MG tablet, Take 1 tablet (500 mg total) by mouth daily with breakfast. (Patient not taking: Reported on  10/19/2022), Disp: 90 tablet, Rfl: 3   morphine (MSIR) 15 MG tablet, Take 1 tablet (15 mg total) by mouth every 6 (six) hours as needed for severe pain., Disp: 120 tablet, Rfl: 0   omeprazole (PRILOSEC) 40 MG capsule, TAKE 1 CAPSULE (40 MG TOTAL) BY MOUTH DAILY., Disp: 90 capsule, Rfl: 1   ondansetron (ZOFRAN) 8 MG tablet, Take 1 tablet (8 mg total) by mouth every 8 (eight) hours as needed for nausea, vomiting or refractory nausea / vomiting. Start on the third day after chemotherapy., Disp: 30 tablet, Rfl: 1   polyethylene glycol (MIRALAX / GLYCOLAX) 17 g packet, Take 17 g by mouth daily., Disp: , Rfl:    prochlorperazine (COMPAZINE) 10 MG tablet, Take 1 tablet (10  mg total) by mouth every 6 (six) hours as needed for nausea or vomiting., Disp: 30 tablet, Rfl: 1   sodium chloride flush (NS) 0.9 % SOLN, Inject 10 mLs into each port 3 (three) times a week. Flush each port of PICC Monday, Wednesday and Friday., Disp: 240 mL, Rfl: 1   temazepam (RESTORIL) 7.5 MG capsule, Take 1 capsule (7.5 mg total) by mouth at bedtime as needed for sleep. (Patient not taking: Reported on 10/19/2022), Disp: 30 capsule, Rfl: 0  Current Facility-Administered Medications:    alteplase (CATHFLO ACTIVASE) injection 2 mg, 2 mg, Intracatheter, Once, Feven Alderfer, Rose Phi, MD   alteplase (CATHFLO ACTIVASE) injection 2 mg, 2 mg, Intracatheter, Once, Wendel Homeyer, Rose Phi, MD  Facility-Administered Medications Ordered in Other Visits:    heparin lock flush 100 unit/mL, 500 Units, Intravenous, Once, Yony Roulston, Rose Phi, MD   HYDROcodone-acetaminophen (NORCO/VICODIN) 5-325 MG per tablet 1 tablet, 1 tablet, Oral, Once, Erenest Blank, NP   sodium chloride flush (NS) 0.9 % injection 10 mL, 10 mL, Intravenous, Once, Abrianna Sidman, Rose Phi, MD  Allergies:  Allergies  Allergen Reactions   Percocet [Oxycodone-Acetaminophen] Nausea And Vomiting   Oxycodone Other (See Comments)   Oxycodone-Aspirin Nausea And Vomiting    Past Medical History, Surgical history, Social history, and Family History were reviewed and updated.  Review of Systems: Review of Systems  Constitutional:  Positive for fatigue.  HENT:  Negative.    Eyes: Negative.   Respiratory: Negative.    Cardiovascular: Negative.   Gastrointestinal:  Positive for nausea.  Endocrine: Negative.   Genitourinary: Negative.    Musculoskeletal: Negative.   Skin: Negative.   Neurological: Negative.   Hematological: Negative.   Psychiatric/Behavioral: Negative.      Physical Exam:  height is 5\' 9"  (1.753 m) and weight is 143 lb (64.9 kg). His oral temperature is 98.3 F (36.8 C). His blood pressure is 128/73 and his pulse is 71. His respiration  is 18 and oxygen saturation is 100%.   Wt Readings from Last 3 Encounters:  11/16/22 143 lb (64.9 kg)  11/02/22 142 lb (64.4 kg)  10/21/22 145 lb (65.8 kg)    Physical Exam Vitals reviewed.  HENT:     Head: Normocephalic and atraumatic.  Eyes:     Pupils: Pupils are equal, round, and reactive to light.  Cardiovascular:     Rate and Rhythm: Normal rate and regular rhythm.     Heart sounds: Normal heart sounds.  Pulmonary:     Effort: Pulmonary effort is normal.     Breath sounds: Normal breath sounds.  Abdominal:     General: Bowel sounds are normal.     Palpations: Abdomen is soft.  Musculoskeletal:        General: No  tenderness or deformity. Normal range of motion.     Cervical back: Normal range of motion.  Lymphadenopathy:     Cervical: No cervical adenopathy.  Skin:    General: Skin is warm and dry.     Findings: No erythema or rash.  Neurological:     Mental Status: He is alert and oriented to person, place, and time.  Psychiatric:        Behavior: Behavior normal.        Thought Content: Thought content normal.        Judgment: Judgment normal.   Lab Results  Component Value Date   WBC 6.7 11/16/2022   HGB 12.5 (L) 11/16/2022   HCT 38.7 (L) 11/16/2022   MCV 89.6 11/16/2022   PLT 303 11/16/2022     Chemistry      Component Value Date/Time   NA 137 11/16/2022 0910   NA 139 07/23/2020 0818   K 4.1 11/16/2022 0910   CL 101 11/16/2022 0910   CO2 30 11/16/2022 0910   BUN 20 11/16/2022 0910   BUN 10 07/23/2020 0818   CREATININE 1.02 11/16/2022 0910   CREATININE 1.06 06/27/2015 0837      Component Value Date/Time   CALCIUM 9.5 11/16/2022 0910   ALKPHOS 95 11/16/2022 0910   AST 11 (L) 11/16/2022 0910   ALT 12 11/16/2022 0910   BILITOT 0.5 11/16/2022 0910      Impression and Plan: Mr. Detty is a very nice 82 year old white male.  He has what I hoped to believe is localized gastric cancer.   I know that I will be incredibly predictive as to what  the PET scan will show.  I said that if there is still active disease, then we can definitely think about surgery.  However, he is still does not wish to have surgery.  I just really want to have his quality of life to be as good as possible.  Again, I know that he is trying hard.  I know his wife and family are doing a great job with him.  He has such a good support system.  We will go ahead and do the PET scan in 2 weeks.  I will see him back in 3 weeks   Josph Macho, MD 6/11/202410:30 AM

## 2022-11-17 ENCOUNTER — Other Ambulatory Visit: Payer: Self-pay

## 2022-11-18 ENCOUNTER — Other Ambulatory Visit: Payer: Self-pay | Admitting: Hematology & Oncology

## 2022-11-18 DIAGNOSIS — C160 Malignant neoplasm of cardia: Secondary | ICD-10-CM

## 2022-11-19 ENCOUNTER — Encounter: Payer: Self-pay | Admitting: Hematology & Oncology

## 2022-11-19 MED ORDER — TEMAZEPAM 7.5 MG PO CAPS
7.5000 mg | ORAL_CAPSULE | Freq: Every evening | ORAL | 0 refills | Status: DC | PRN
Start: 2022-11-19 — End: 2022-12-17

## 2022-11-19 NOTE — Telephone Encounter (Signed)
Last refilled 10/18/22 #30. Please advise, thanks!

## 2022-11-23 ENCOUNTER — Encounter: Payer: Self-pay | Admitting: Hematology & Oncology

## 2022-11-23 ENCOUNTER — Other Ambulatory Visit (HOSPITAL_BASED_OUTPATIENT_CLINIC_OR_DEPARTMENT_OTHER): Payer: Self-pay

## 2022-11-24 ENCOUNTER — Other Ambulatory Visit (HOSPITAL_BASED_OUTPATIENT_CLINIC_OR_DEPARTMENT_OTHER): Payer: Self-pay

## 2022-11-25 ENCOUNTER — Other Ambulatory Visit: Payer: Self-pay

## 2022-11-26 ENCOUNTER — Inpatient Hospital Stay: Payer: Medicare HMO

## 2022-11-26 VITALS — BP 138/62 | HR 82 | Temp 97.7°F | Resp 17

## 2022-11-26 DIAGNOSIS — Z452 Encounter for adjustment and management of vascular access device: Secondary | ICD-10-CM | POA: Diagnosis not present

## 2022-11-26 DIAGNOSIS — C169 Malignant neoplasm of stomach, unspecified: Secondary | ICD-10-CM | POA: Diagnosis not present

## 2022-11-26 DIAGNOSIS — Z5112 Encounter for antineoplastic immunotherapy: Secondary | ICD-10-CM | POA: Diagnosis not present

## 2022-11-26 MED ORDER — SODIUM CHLORIDE 0.9% FLUSH
10.0000 mL | Freq: Once | INTRAVENOUS | Status: AC
  Administered 2022-11-26: 10 mL via INTRAVENOUS

## 2022-11-26 MED ORDER — HEPARIN SOD (PORK) LOCK FLUSH 100 UNIT/ML IV SOLN
250.0000 [IU] | Freq: Once | INTRAVENOUS | Status: AC
  Administered 2022-11-26: 250 [IU] via INTRAVENOUS

## 2022-11-26 NOTE — Patient Instructions (Signed)
PICC Home Care Guide A peripherally inserted central catheter (PICC) is a form of IV access that allows medicines and IV fluids to be quickly put into the blood and spread throughout the body. The PICC is a long, thin, flexible tube (catheter) that is put into a vein in a person's arm or leg. The catheter ends in a large vein just outside the heart called the superior vena cava (SVC). After the PICC is put in, a chest X-ray may be done to make sure that it is in the right place. A PICC may be placed for different reasons, such as: To give medicines and liquid nutrition. To give IV fluids and blood products. To take blood samples often. If there is trouble placing a peripheral intravenous (PIV) catheter. If cared for properly, a PICC can remain in place for many months. Having a PICC can allow you to go home from the hospital sooner and continue treatment at home. Medicines and PICC care can be managed at home by a family member, caregiver, or home health care team. What are the risks? Generally, having a PICC is safe. However, problems may occur, including: A blood clot (thrombus) forming in or at the end of the PICC. A blood clot forming in a vein (deep vein thrombosis) or traveling to the lung (pulmonary embolism). Inflammation of the vein (phlebitis) in which the PICC is placed. Infection at the insertion site or in the blood. Blood infections from central lines, like PICCs, can be serious and often require a hospital stay. PICC malposition, or PICC movement or poor placement. A break or cut in the PICC. Do not use scissors near the PICC. Nerve or tendon irritation or injury during PICC insertion. How to care for your PICC Please follow the specific guidelines provided by your health care provider. Preventing infection You and any caregivers should wash your hands often with soap and water for at least 20 seconds. Wash hands: Before touching the PICC or the infusion device. Before changing a  bandage (dressing). Do not change the dressing unless you have been taught to do so and have shown you are able to change it safely. Flush the PICC as told. Tell your health care provider right away if the PICC is hard to flush or does not flush. Do not use force to flush the PICC. Use clean and germ-free (sterile) supplies only. Keep the supplies in a dry place. Do not reuse needles, syringes, or any other supplies. Reusing supplies can lead to infection. Keep the PICC dressing dry and secure it with tape if the edges stop sticking to your skin. Check your PICC insertion site every day for signs of infection. Check for: Redness, swelling, or pain. Fluid or blood. Warmth. Pus or a bad smell. Preventing other problems Do not use a syringe that is less than 10 mL to flush the PICC. Do not have your blood pressure checked on the arm in which the PICC is placed. Do not ever pull or tug on the PICC. Keep it secured to your arm with tape or a stretch wrap when not in use. Do not take the PICC out yourself. Only a trained health care provider should remove the PICC. Keep pets and children away from your PICC. How to care for your PICC dressing Keep your PICC dressing clean and dry to prevent infection. Do not take baths, swim, or use a hot tub until your health care provider approves. Ask your health care provider if you can take   showers. You may only be allowed to take sponge baths. When you are allowed to shower: Ask your health care provider to teach you how to wrap the PICC. Cover the PICC with clear plastic wrap and tape to keep it dry while showering. Follow instructions from your health care provider about how to take care of your insertion site and dressing. Make sure you: Wash your hands with soap and water for at least 20 seconds before and after you change your dressing. If soap and water are not available, use hand sanitizer. Change your dressing only if taught to do so by your health care  provider. Your PICC dressing needs to be changed if it becomes loose or wet. Leave stitches (sutures), skin glue, or adhesive strips in place. These skin closures may need to stay in place for 2 weeks or longer. If adhesive strip edges start to loosen and curl up, you may trim the loose edges. Do not remove adhesive strips completely unless your health care provider tells you to do that. Follow these instructions at home: Disposal of supplies Throw away any syringes in a disposal container that is meant for sharp items (sharps container). You can buy a sharps container from a pharmacy, or you can make one by using an empty, hard plastic bottle with a lid. Place any used dressings or infusion bags into a plastic bag. Throw that bag in the trash. General instructions  Always carry your PICC identification card or wear a medical alert bracelet. Keep the tube clamped at all times, unless it is being used. Always carry a smooth-edge clamp with you to clamp the PICC if it breaks. Do not use scissors or sharp objects near the tube. You may bend your arm and move it freely. If your PICC is near or at the bend of your elbow, avoid activity with repeated motion at the elbow. Avoid lifting heavy objects as told by your health care provider. Keep all follow-up visits. This is important. You will need to have your PICC dressing changed at least once a week. Contact a health care provider if: You have pain in your arm, ear, face, or teeth. You have a fever or chills. You have redness, swelling, or pain around the insertion site. You have fluid or blood coming from the insertion site. Your insertion site feels warm to the touch. You have pus or a bad smell coming from the insertion site. Your skin feels hard and raised around the insertion site. Your PICC dressing has gotten wet or is coming off and you have not been taught how to change it. Get help right away if: You have problems with your PICC, such as  your PICC: Was tugged or pulled and has partially come out. Do not  push the PICC back in. Cannot be flushed, is hard to flush, or leaks around the insertion site when it is flushed. Makes a flushing sound when it is flushed. Appears to have a hole or tear. Is accidentally pulled all the way out. If this happens, cover the insertion site with a gauze dressing. Do not throw the PICC away. Your health care provider will need to check it to be sure the entire catheter came out. You feel your heart racing or skipping beats, or you have chest pain. You have shortness of breath or trouble breathing. You have swelling, redness, warmth, or pain in the arm in which the PICC is placed. You have a red streak going up your arm that   starts under the PICC dressing. These symptoms may be an emergency. Get help right away. Call 911. Do not wait to see if the symptoms will go away. Do not drive yourself to the hospital. Summary A peripherally inserted central catheter (PICC) is a long, thin, flexible tube (catheter) that is put into a vein in the arm or leg. If cared for properly, a PICC can remain in place for many months. Having a PICC can allow you to go home from the hospital sooner and continue treatment at home. The PICC is inserted using a germ-free (sterile) technique by a specially trained health care provider. Only a trained health care provider should remove it. Do not have your blood pressure checked on the arm in which your PICC is placed. Always keep your PICC identification card with you. This information is not intended to replace advice given to you by your health care provider. Make sure you discuss any questions you have with your health care provider. Document Revised: 12/10/2020 Document Reviewed: 12/10/2020 Elsevier Patient Education  2024 [employer].  

## 2022-11-29 ENCOUNTER — Other Ambulatory Visit (HOSPITAL_BASED_OUTPATIENT_CLINIC_OR_DEPARTMENT_OTHER): Payer: Self-pay

## 2022-11-29 ENCOUNTER — Other Ambulatory Visit: Payer: Self-pay

## 2022-11-30 ENCOUNTER — Encounter: Payer: Self-pay | Admitting: *Deleted

## 2022-11-30 ENCOUNTER — Other Ambulatory Visit (HOSPITAL_BASED_OUTPATIENT_CLINIC_OR_DEPARTMENT_OTHER): Payer: Self-pay

## 2022-12-01 ENCOUNTER — Other Ambulatory Visit (HOSPITAL_BASED_OUTPATIENT_CLINIC_OR_DEPARTMENT_OTHER): Payer: Self-pay

## 2022-12-02 ENCOUNTER — Other Ambulatory Visit: Payer: Self-pay

## 2022-12-06 ENCOUNTER — Other Ambulatory Visit: Payer: Self-pay | Admitting: Hematology & Oncology

## 2022-12-07 ENCOUNTER — Other Ambulatory Visit: Payer: Self-pay

## 2022-12-07 ENCOUNTER — Encounter: Payer: Self-pay | Admitting: Hematology & Oncology

## 2022-12-07 ENCOUNTER — Inpatient Hospital Stay: Payer: Medicare HMO | Attending: Hematology & Oncology

## 2022-12-07 ENCOUNTER — Inpatient Hospital Stay: Payer: Medicare HMO

## 2022-12-07 ENCOUNTER — Inpatient Hospital Stay: Payer: Medicare HMO | Admitting: Hematology & Oncology

## 2022-12-07 VITALS — BP 139/67 | HR 89 | Temp 98.4°F | Resp 18 | Ht 69.0 in | Wt 145.0 lb

## 2022-12-07 DIAGNOSIS — G629 Polyneuropathy, unspecified: Secondary | ICD-10-CM | POA: Insufficient documentation

## 2022-12-07 DIAGNOSIS — C160 Malignant neoplasm of cardia: Secondary | ICD-10-CM

## 2022-12-07 DIAGNOSIS — Z5112 Encounter for antineoplastic immunotherapy: Secondary | ICD-10-CM | POA: Diagnosis not present

## 2022-12-07 DIAGNOSIS — Z79899 Other long term (current) drug therapy: Secondary | ICD-10-CM | POA: Insufficient documentation

## 2022-12-07 DIAGNOSIS — Z452 Encounter for adjustment and management of vascular access device: Secondary | ICD-10-CM | POA: Insufficient documentation

## 2022-12-07 DIAGNOSIS — E038 Other specified hypothyroidism: Secondary | ICD-10-CM

## 2022-12-07 DIAGNOSIS — C169 Malignant neoplasm of stomach, unspecified: Secondary | ICD-10-CM | POA: Insufficient documentation

## 2022-12-07 LAB — CBC WITH DIFFERENTIAL (CANCER CENTER ONLY)
Abs Immature Granulocytes: 0.02 10*3/uL (ref 0.00–0.07)
Basophils Absolute: 0.1 10*3/uL (ref 0.0–0.1)
Basophils Relative: 2 %
Eosinophils Absolute: 0.4 10*3/uL (ref 0.0–0.5)
Eosinophils Relative: 6 %
HCT: 39.9 % (ref 39.0–52.0)
Hemoglobin: 12.7 g/dL — ABNORMAL LOW (ref 13.0–17.0)
Immature Granulocytes: 0 %
Lymphocytes Relative: 15 %
Lymphs Abs: 0.9 10*3/uL (ref 0.7–4.0)
MCH: 28.9 pg (ref 26.0–34.0)
MCHC: 31.8 g/dL (ref 30.0–36.0)
MCV: 90.9 fL (ref 80.0–100.0)
Monocytes Absolute: 0.6 10*3/uL (ref 0.1–1.0)
Monocytes Relative: 10 %
Neutro Abs: 4.4 10*3/uL (ref 1.7–7.7)
Neutrophils Relative %: 67 %
Platelet Count: 330 10*3/uL (ref 150–400)
RBC: 4.39 MIL/uL (ref 4.22–5.81)
RDW: 14.1 % (ref 11.5–15.5)
WBC Count: 6.4 10*3/uL (ref 4.0–10.5)
nRBC: 0 % (ref 0.0–0.2)

## 2022-12-07 LAB — CMP (CANCER CENTER ONLY)
ALT: 12 U/L (ref 0–44)
AST: 13 U/L — ABNORMAL LOW (ref 15–41)
Albumin: 4 g/dL (ref 3.5–5.0)
Alkaline Phosphatase: 78 U/L (ref 38–126)
Anion gap: 6 (ref 5–15)
BUN: 20 mg/dL (ref 8–23)
CO2: 28 mmol/L (ref 22–32)
Calcium: 9.3 mg/dL (ref 8.9–10.3)
Chloride: 102 mmol/L (ref 98–111)
Creatinine: 0.97 mg/dL (ref 0.61–1.24)
GFR, Estimated: >60 mL/min (ref >60)
Glucose, Bld: 118 mg/dL — ABNORMAL HIGH (ref 70–99)
Potassium: 4.1 mmol/L (ref 3.5–5.1)
Sodium: 136 mmol/L (ref 135–145)
Total Bilirubin: 0.4 mg/dL (ref 0.3–1.2)
Total Protein: 6.8 g/dL (ref 6.5–8.1)

## 2022-12-07 LAB — TSH: TSH: 5.788 u[IU]/mL — ABNORMAL HIGH (ref 0.350–4.500)

## 2022-12-07 LAB — LACTATE DEHYDROGENASE: LDH: 111 U/L (ref 98–192)

## 2022-12-07 MED ORDER — SODIUM CHLORIDE 0.9 % IV SOLN: Freq: Once | INTRAVENOUS | Status: AC

## 2022-12-07 MED ORDER — LEVOTHYROXINE SODIUM 100 MCG PO TABS
100.0000 ug | ORAL_TABLET | Freq: Every day | ORAL | 2 refills | Status: DC
Start: 2022-12-07 — End: 2023-02-08

## 2022-12-07 MED ORDER — HEPARIN SOD (PORK) LOCK FLUSH 100 UNIT/ML IV SOLN
250.0000 [IU] | Freq: Once | INTRAVENOUS | Status: AC | PRN
  Administered 2022-12-07: 250 [IU]

## 2022-12-07 MED ORDER — SODIUM CHLORIDE 0.9 % IV SOLN
240.0000 mg | INTRAVENOUS | Status: DC
  Administered 2022-12-07: 240 mg via INTRAVENOUS
  Filled 2022-12-07: qty 24

## 2022-12-07 MED ORDER — SODIUM CHLORIDE 0.9% FLUSH
10.0000 mL | INTRAVENOUS | Status: DC | PRN
  Administered 2022-12-07: 10 mL

## 2022-12-07 NOTE — Patient Instructions (Signed)
[facility]  Discharge Instructions: Thank you for choosing [facility] to provide your oncology and hematology care.   If you have a lab appointment with the Cancer Center, please go directly to the Cancer Center and check in at the registration area.  Wear comfortable clothing and clothing appropriate for easy access to any Portacath or PICC line.   We strive to give you quality time with your provider. You may need to reschedule your appointment if you arrive late (15 or more minutes).  Arriving late affects you and other patients whose appointments are after yours.  Also, if you miss three or more appointments without notifying the office, you may be dismissed from the clinic at the provider's discretion.      For prescription refill requests, have your pharmacy contact our office and allow 72 hours for refills to be completed.    Today you received the following chemotherapy and/or immunotherapy agents opdivo       To help prevent nausea and vomiting after your treatment, we encourage you to take your nausea medication as directed.  BELOW ARE SYMPTOMS THAT SHOULD BE REPORTED IMMEDIATELY: *FEVER GREATER THAN 100.4 F (38 C) OR HIGHER *CHILLS OR SWEATING *NAUSEA AND VOMITING THAT IS NOT CONTROLLED WITH YOUR NAUSEA MEDICATION *UNUSUAL SHORTNESS OF BREATH *UNUSUAL BRUISING OR BLEEDING *URINARY PROBLEMS (pain or burning when urinating, or frequent urination) *BOWEL PROBLEMS (unusual diarrhea, constipation, pain near the anus) TENDERNESS IN MOUTH AND THROAT WITH OR WITHOUT PRESENCE OF ULCERS (sore throat, sores in mouth, or a toothache) UNUSUAL RASH, SWELLING OR PAIN  UNUSUAL VAGINAL DISCHARGE OR ITCHING   Items with * indicate a potential emergency and should be followed up as soon as possible or go to the Emergency Department if any problems should occur.  Please show the CHEMOTHERAPY ALERT CARD or IMMUNOTHERAPY ALERT CARD at check-in  to the Emergency Department and triage nurse. Should you have questions after your visit or need to cancel or reschedule your appointment, please contact [facility]  [phone] and follow the prompts.  Office hours are 8:00 a.m. to 4:30 p.m. Monday - Friday. Please note that voicemails left after 4:00 p.m. may not be returned until the following business day.  We are closed weekends and major holidays. You have access to a nurse at all times for urgent questions. Please call the main number to the clinic [phone] and follow the prompts.  For any non-urgent questions, you may also contact your provider using MyChart. We now offer e-Visits for anyone 18 and older to request care online for non-urgent symptoms. For details visit [email or web address].   Also download the MyChart app! Go to the app store, search "MyChart", open the app, select [facility], and log in with your MyChart username and password.   

## 2022-12-07 NOTE — Progress Notes (Signed)
Ma'am yes no surgery yes ma'am good Hematology and Oncology Follow Up Visit  George Dyer [id number] 06-Nov-1940 82 y.o. 12/07/2022   Principle Diagnosis:  Stage IIB (T3N0M0) Gastric cancer -- declines surgery -recurrent-  HER2(-)/ BRAF (+)  Current Therapy:   FOLFOX - s/p cycle #4-  start on 09/15/2021 XRT/Xeloda -- start on 12/07/2021 --completed on 01/12/2022 FOLFIRI/Opdivo -- st/p cycle #1 on 10/19/2022 -- d/c FOLFIRI on 11/02/2022     Interim History:  George Dyer is back for follow-up.  He still has not yet had the PET scan.  This will be done on 12/17/2022.  He does seem to be doing a little bit better.  He has gained a little bit of weight.  He still feels tired.  We will have to see what his TSH is.  Last time that we checked, the TSH was up a little bit.  He is on Synthroid.  He still has the neuropathy in his legs.  This is about the same.  He has had no cough.  There is been no bleeding.  He has had no fever.  He has had no headache.  Overall, I would say that his performance status is probably ECOG 1.    Medications:  Current Outpatient Medications:    acetaminophen (TYLENOL) 325 MG tablet, Take 650 mg by mouth 2 (two) times daily as needed., Disp: , Rfl:    alum & mag hydroxide-simeth (MAALOX/MYLANTA) 200-200-20 MG/5ML suspension, Take 30 mLs by mouth daily., Disp: , Rfl:    dexamethasone (DECADRON) 4 MG tablet, Take 2 tablets (8 mg total) by mouth daily. Start the day after chemotherapy for 2 days. Take with food., Disp: 8 tablet, Rfl: 5   dronabinol (MARINOL) 2.5 MG capsule, Take 1 capsule (2.5 mg total) by mouth 2 (two) times daily before a meal. (Patient not taking: Reported on 10/19/2022), Disp: 60 capsule, Rfl: 0   DULoxetine (CYMBALTA) 30 MG capsule, Take 2 capsules (60 mg total) by mouth at bedtime. Take 1 capsule (30 mg) for the 1st week, then increase to 2 capsules thereafter. (Patient not taking: Reported on 10/19/2022), Disp: 60 capsule, Rfl: 3   Heparin Na,  Pork, Lock Flsh PF 100 UNIT/ML SOLN, Flush each PICC lumen with 2.5 mLs (250 Units total) as directed every Monday, Wednesday, and Friday., Disp: 120 mL, Rfl: 1   lactulose (CHRONULAC) 10 GM/15ML solution, Take 30 mLs (20 g total) by mouth 3 (three) times daily., Disp: 1000 mL, Rfl: 4   levothyroxine (SYNTHROID) 75 MCG tablet, TAKE 1 TABLET BY MOUTH DAILY BEFORE BREAKFAST., Disp: 30 tablet, Rfl: 4   loperamide (IMODIUM) 2 MG capsule, Take 2 tabs by mouth with first loose stool, then 1 tab with each additional loose stool as needed. Do not exceed 8 tabs in a 24-hour period, Disp: 100 capsule, Rfl: 3   megestrol (MEGACE ES) 625 MG/5ML suspension, Take 5 mLs (625 mg total) by mouth daily. (Patient not taking: Reported on 10/19/2022), Disp: 150 mL, Rfl: 4   megestrol (MEGACE) 400 MG/10ML suspension, Take 10 mLs (400 mg total) by mouth 2 (two) times daily. (Patient not taking: Reported on 10/19/2022), Disp: 240 mL, Rfl: 0   metFORMIN (GLUCOPHAGE) 500 MG tablet, Take 1 tablet (500 mg total) by mouth daily with breakfast. (Patient not taking: Reported on 10/19/2022), Disp: 90 tablet, Rfl: 3   omeprazole (PRILOSEC) 40 MG capsule, TAKE 1 CAPSULE (40 MG TOTAL) BY MOUTH DAILY., Disp: 90 capsule, Rfl: 1   ondansetron (ZOFRAN) 8 MG  tablet, Take 1 tablet (8 mg total) by mouth every 8 (eight) hours as needed for nausea, vomiting or refractory nausea / vomiting. Start on the third day after chemotherapy., Disp: 30 tablet, Rfl: 1   polyethylene glycol (MIRALAX / GLYCOLAX) 17 g packet, Take 17 g by mouth daily., Disp: , Rfl:    prochlorperazine (COMPAZINE) 10 MG tablet, Take 1 tablet (10 mg total) by mouth every 6 (six) hours as needed for nausea or vomiting., Disp: 30 tablet, Rfl: 1   sodium chloride flush (NS) 0.9 % SOLN, Inject 10 mLs into each port 3 (three) times a week. Flush each port of PICC Monday, Wednesday and Friday., Disp: 240 mL, Rfl: 1   temazepam (RESTORIL) 7.5 MG capsule, Take 1 capsule (7.5 mg total) by  mouth at bedtime as needed for sleep., Disp: 30 capsule, Rfl: 0  Current Facility-Administered Medications:    alteplase (CATHFLO ACTIVASE) injection 2 mg, 2 mg, Intracatheter, Once, Tomie Spizzirri, Rose Phi, MD   alteplase (CATHFLO ACTIVASE) injection 2 mg, 2 mg, Intracatheter, Once, Skarlet Lyons, Rose Phi, MD  Facility-Administered Medications Ordered in Other Visits:    heparin lock flush 100 unit/mL, 500 Units, Intravenous, Once, Clarion Mooneyhan, Rose Phi, MD   HYDROcodone-acetaminophen (NORCO/VICODIN) 5-325 MG per tablet 1 tablet, 1 tablet, Oral, Once, Erenest Blank, NP   sodium chloride flush (NS) 0.9 % injection 10 mL, 10 mL, Intravenous, Once, Alta Shober, Rose Phi, MD  Allergies:  Allergies  Allergen Reactions   Percocet [Oxycodone-Acetaminophen] Nausea And Vomiting   Oxycodone Other (See Comments)   Oxycodone-Aspirin Nausea And Vomiting    Past Medical History, Surgical history, Social history, and Family History were reviewed and updated.  Review of Systems: Review of Systems  Constitutional:  Positive for fatigue.  HENT:  Negative.    Eyes: Negative.   Respiratory: Negative.    Cardiovascular: Negative.   Gastrointestinal:  Positive for nausea.  Endocrine: Negative.   Genitourinary: Negative.    Musculoskeletal: Negative.   Skin: Negative.   Neurological: Negative.   Hematological: Negative.   Psychiatric/Behavioral: Negative.      Physical Exam:  height is 5\' 9"  (1.753 m) and weight is 145 lb (65.8 kg). His oral temperature is 98.4 F (36.9 C). His blood pressure is 139/67 and his pulse is 89. His respiration is 18 and oxygen saturation is 99%.   Wt Readings from Last 3 Encounters:  12/07/22 145 lb (65.8 kg)  11/16/22 143 lb (64.9 kg)  11/02/22 142 lb (64.4 kg)    Physical Exam Vitals reviewed.  HENT:     Head: Normocephalic and atraumatic.  Eyes:     Pupils: Pupils are equal, round, and reactive to light.  Cardiovascular:     Rate and Rhythm: Normal rate and regular rhythm.      Heart sounds: Normal heart sounds.  Pulmonary:     Effort: Pulmonary effort is normal.     Breath sounds: Normal breath sounds.  Abdominal:     General: Bowel sounds are normal.     Palpations: Abdomen is soft.  Musculoskeletal:        General: No tenderness or deformity. Normal range of motion.     Cervical back: Normal range of motion.  Lymphadenopathy:     Cervical: No cervical adenopathy.  Skin:    General: Skin is warm and dry.     Findings: No erythema or rash.  Neurological:     Mental Status: He is alert and oriented to person, place, and time.  Psychiatric:  Behavior: Behavior normal.        Thought Content: Thought content normal.        Judgment: Judgment normal.    Lab Results  Component Value Date   WBC 6.4 12/07/2022   HGB 12.7 (L) 12/07/2022   HCT 39.9 12/07/2022   MCV 90.9 12/07/2022   PLT 330 12/07/2022     Chemistry      Component Value Date/Time   NA 136 12/07/2022 0955   NA 139 07/23/2020 0818   K 4.1 12/07/2022 0955   CL 102 12/07/2022 0955   CO2 28 12/07/2022 0955   BUN 20 12/07/2022 0955   BUN 10 07/23/2020 0818   CREATININE 0.97 12/07/2022 0955   CREATININE 1.06 06/27/2015 0837      Component Value Date/Time   CALCIUM 9.3 12/07/2022 0955   ALKPHOS 78 12/07/2022 0955   AST 13 (L) 12/07/2022 0955   ALT 12 12/07/2022 0955   BILITOT 0.4 12/07/2022 0955      Impression and Plan: Mr. Piekarski is a very nice 82 year old white male.  Has a history of localized gastric cancer.  So far, he has not consented to any surgery.  I think that surgery probably would have the best chance to get him into remission and potentially cure this.  He currently is on immunotherapy.  He cannot tolerate the FOLFIRI that we have given him.  He will be interesting to see what the PET scan has to show with regard to response.  I am glad that his quality life is better.  I am glad that he is gaining a little bit of weight.  We will plan for follow-up  in another couple of weeks.    Josph Macho, MD 7/2/202410:56 AM

## 2022-12-07 NOTE — Patient Instructions (Signed)
PICC Home Care Guide A peripherally inserted central catheter (PICC) is a form of IV access that allows medicines and IV fluids to be quickly put into the blood and spread throughout the body. The PICC is a long, thin, flexible tube (catheter) that is put into a vein in a person's arm or leg. The catheter ends in a large vein just outside the heart called the superior vena cava (SVC). After the PICC is put in, a chest X-ray may be done to make sure that it is in the right place. A PICC may be placed for different reasons, such as: To give medicines and liquid nutrition. To give IV fluids and blood products. To take blood samples often. If there is trouble placing a peripheral intravenous (PIV) catheter. If cared for properly, a PICC can remain in place for many months. Having a PICC can allow you to go home from the hospital sooner and continue treatment at home. Medicines and PICC care can be managed at home by a family member, caregiver, or home health care team. What are the risks? Generally, having a PICC is safe. However, problems may occur, including: A blood clot (thrombus) forming in or at the end of the PICC. A blood clot forming in a vein (deep vein thrombosis) or traveling to the lung (pulmonary embolism). Inflammation of the vein (phlebitis) in which the PICC is placed. Infection at the insertion site or in the blood. Blood infections from central lines, like PICCs, can be serious and often require a hospital stay. PICC malposition, or PICC movement or poor placement. A break or cut in the PICC. Do not use scissors near the PICC. Nerve or tendon irritation or injury during PICC insertion. How to care for your PICC Please follow the specific guidelines provided by your health care provider. Preventing infection You and any caregivers should wash your hands often with soap and water for at least 20 seconds. Wash hands: Before touching the PICC or the infusion device. Before changing a  bandage (dressing). Do not change the dressing unless you have been taught to do so and have shown you are able to change it safely. Flush the PICC as told. Tell your health care provider right away if the PICC is hard to flush or does not flush. Do not use force to flush the PICC. Use clean and germ-free (sterile) supplies only. Keep the supplies in a dry place. Do not reuse needles, syringes, or any other supplies. Reusing supplies can lead to infection. Keep the PICC dressing dry and secure it with tape if the edges stop sticking to your skin. Check your PICC insertion site every day for signs of infection. Check for: Redness, swelling, or pain. Fluid or blood. Warmth. Pus or a bad smell. Preventing other problems Do not use a syringe that is less than 10 mL to flush the PICC. Do not have your blood pressure checked on the arm in which the PICC is placed. Do not ever pull or tug on the PICC. Keep it secured to your arm with tape or a stretch wrap when not in use. Do not take the PICC out yourself. Only a trained health care provider should remove the PICC. Keep pets and children away from your PICC. How to care for your PICC dressing Keep your PICC dressing clean and dry to prevent infection. Do not take baths, swim, or use a hot tub until your health care provider approves. Ask your health care provider if you can take   showers. You may only be allowed to take sponge baths. When you are allowed to shower: Ask your health care provider to teach you how to wrap the PICC. Cover the PICC with clear plastic wrap and tape to keep it dry while showering. Follow instructions from your health care provider about how to take care of your insertion site and dressing. Make sure you: Wash your hands with soap and water for at least 20 seconds before and after you change your dressing. If soap and water are not available, use hand sanitizer. Change your dressing only if taught to do so by your health care  provider. Your PICC dressing needs to be changed if it becomes loose or wet. Leave stitches (sutures), skin glue, or adhesive strips in place. These skin closures may need to stay in place for 2 weeks or longer. If adhesive strip edges start to loosen and curl up, you may trim the loose edges. Do not remove adhesive strips completely unless your health care provider tells you to do that. Follow these instructions at home: Disposal of supplies Throw away any syringes in a disposal container that is meant for sharp items (sharps container). You can buy a sharps container from a pharmacy, or you can make one by using an empty, hard plastic bottle with a lid. Place any used dressings or infusion bags into a plastic bag. Throw that bag in the trash. General instructions  Always carry your PICC identification card or wear a medical alert bracelet. Keep the tube clamped at all times, unless it is being used. Always carry a smooth-edge clamp with you to clamp the PICC if it breaks. Do not use scissors or sharp objects near the tube. You may bend your arm and move it freely. If your PICC is near or at the bend of your elbow, avoid activity with repeated motion at the elbow. Avoid lifting heavy objects as told by your health care provider. Keep all follow-up visits. This is important. You will need to have your PICC dressing changed at least once a week. Contact a health care provider if: You have pain in your arm, ear, face, or teeth. You have a fever or chills. You have redness, swelling, or pain around the insertion site. You have fluid or blood coming from the insertion site. Your insertion site feels warm to the touch. You have pus or a bad smell coming from the insertion site. Your skin feels hard and raised around the insertion site. Your PICC dressing has gotten wet or is coming off and you have not been taught how to change it. Get help right away if: You have problems with your PICC, such as  your PICC: Was tugged or pulled and has partially come out. Do not  push the PICC back in. Cannot be flushed, is hard to flush, or leaks around the insertion site when it is flushed. Makes a flushing sound when it is flushed. Appears to have a hole or tear. Is accidentally pulled all the way out. If this happens, cover the insertion site with a gauze dressing. Do not throw the PICC away. Your health care provider will need to check it to be sure the entire catheter came out. You feel your heart racing or skipping beats, or you have chest pain. You have shortness of breath or trouble breathing. You have swelling, redness, warmth, or pain in the arm in which the PICC is placed. You have a red streak going up your arm that   starts under the PICC dressing. These symptoms may be an emergency. Get help right away. Call 911. Do not wait to see if the symptoms will go away. Do not drive yourself to the hospital. Summary A peripherally inserted central catheter (PICC) is a long, thin, flexible tube (catheter) that is put into a vein in the arm or leg. If cared for properly, a PICC can remain in place for many months. Having a PICC can allow you to go home from the hospital sooner and continue treatment at home. The PICC is inserted using a germ-free (sterile) technique by a specially trained health care provider. Only a trained health care provider should remove it. Do not have your blood pressure checked on the arm in which your PICC is placed. Always keep your PICC identification card with you. This information is not intended to replace advice given to you by your health care provider. Make sure you discuss any questions you have with your health care provider. Document Revised: 12/10/2020 Document Reviewed: 12/10/2020 Elsevier Patient Education  2024 [employer].  

## 2022-12-08 ENCOUNTER — Other Ambulatory Visit: Payer: Self-pay

## 2022-12-08 ENCOUNTER — Encounter (HOSPITAL_COMMUNITY): Payer: Medicare HMO

## 2022-12-17 ENCOUNTER — Inpatient Hospital Stay: Payer: Medicare HMO

## 2022-12-17 ENCOUNTER — Ambulatory Visit (HOSPITAL_COMMUNITY)
Admission: RE | Admit: 2022-12-17 | Discharge: 2022-12-17 | Disposition: A | Discharge status: Home / Self Care | Payer: Medicare HMO | Source: Ambulatory Visit | Attending: Hematology & Oncology | Admitting: Hematology & Oncology

## 2022-12-17 ENCOUNTER — Other Ambulatory Visit: Payer: Self-pay | Admitting: Hematology & Oncology

## 2022-12-17 VITALS — BP 139/75 | HR 80 | Temp 98.0°F | Resp 18

## 2022-12-17 DIAGNOSIS — C160 Malignant neoplasm of cardia: Secondary | ICD-10-CM | POA: Insufficient documentation

## 2022-12-17 DIAGNOSIS — G629 Polyneuropathy, unspecified: Secondary | ICD-10-CM | POA: Diagnosis not present

## 2022-12-17 DIAGNOSIS — Z79899 Other long term (current) drug therapy: Secondary | ICD-10-CM | POA: Diagnosis not present

## 2022-12-17 DIAGNOSIS — Z452 Encounter for adjustment and management of vascular access device: Secondary | ICD-10-CM | POA: Diagnosis not present

## 2022-12-17 DIAGNOSIS — Z5112 Encounter for antineoplastic immunotherapy: Secondary | ICD-10-CM | POA: Diagnosis not present

## 2022-12-17 DIAGNOSIS — R918 Other nonspecific abnormal finding of lung field: Secondary | ICD-10-CM | POA: Diagnosis not present

## 2022-12-17 DIAGNOSIS — C169 Malignant neoplasm of stomach, unspecified: Secondary | ICD-10-CM | POA: Diagnosis not present

## 2022-12-17 LAB — GLUCOSE, CAPILLARY: Glucose-Capillary: 87 mg/dL (ref 70–99)

## 2022-12-17 MED ORDER — HEPARIN SOD (PORK) LOCK FLUSH 100 UNIT/ML IV SOLN: 250.0000 [IU] | Freq: Once | INTRAVENOUS | Status: DC

## 2022-12-17 MED ORDER — HEPARIN SOD (PORK) LOCK FLUSH 100 UNIT/ML IV SOLN
250.0000 [IU] | Freq: Once | INTRAVENOUS | Status: AC
  Administered 2022-12-17: 250 [IU] via INTRAVENOUS

## 2022-12-17 MED ORDER — SODIUM CHLORIDE 0.9% FLUSH
3.0000 mL | Freq: Once | INTRAVENOUS | Status: AC | PRN
  Administered 2022-12-17: 3 mL

## 2022-12-17 MED ORDER — TEMAZEPAM 7.5 MG PO CAPS: 7.5000 mg | ORAL_CAPSULE | Freq: Every evening | ORAL | 0 refills | Status: DC | PRN

## 2022-12-17 MED ORDER — HEPARIN SOD (PORK) LOCK FLUSH 100 UNIT/ML IV SOLN
250.0000 [IU] | Freq: Once | INTRAVENOUS | Status: AC | PRN
  Administered 2022-12-17: 250 [IU]

## 2022-12-17 MED ORDER — SODIUM CHLORIDE FLUSH 0.9 % IV SOLN
3.0000 mL | Freq: Once | INTRAVENOUS | Status: AC
  Administered 2022-12-17: 3 mL via INTRAVENOUS
  Filled 2022-12-17: qty 3

## 2022-12-17 MED ORDER — FLUDEOXYGLUCOSE F - 18 (FDG) INJECTION
7.2400 | Freq: Once | INTRAVENOUS | Status: AC
  Administered 2022-12-17: 7.24 via INTRAVENOUS

## 2022-12-17 NOTE — Patient Instructions (Signed)
PICC Home Care Guide A peripherally inserted central catheter (PICC) is a form of IV access that allows medicines and IV fluids to be quickly put into the blood and spread throughout the body. The PICC is a long, thin, flexible tube (catheter) that is put into a vein in a person's arm or leg. The catheter ends in a large vein just outside the heart called the superior vena cava (SVC). After the PICC is put in, a chest X-ray may be done to make sure that it is in the right place. A PICC may be placed for different reasons, such as: To give medicines and liquid nutrition. To give IV fluids and blood products. To take blood samples often. If there is trouble placing a peripheral intravenous (PIV) catheter. If cared for properly, a PICC can remain in place for many months. Having a PICC can allow you to go home from the hospital sooner and continue treatment at home. Medicines and PICC care can be managed at home by a family member, caregiver, or home health care team. What are the risks? Generally, having a PICC is safe. However, problems may occur, including: A blood clot (thrombus) forming in or at the end of the PICC. A blood clot forming in a vein (deep vein thrombosis) or traveling to the lung (pulmonary embolism). Inflammation of the vein (phlebitis) in which the PICC is placed. Infection at the insertion site or in the blood. Blood infections from central lines, like PICCs, can be serious and often require a hospital stay. PICC malposition, or PICC movement or poor placement. A break or cut in the PICC. Do not use scissors near the PICC. Nerve or tendon irritation or injury during PICC insertion. How to care for your PICC Please follow the specific guidelines provided by your health care provider. Preventing infection You and any caregivers should wash your hands often with soap and water for at least 20 seconds. Wash hands: Before touching the PICC or the infusion device. Before changing a  bandage (dressing). Do not change the dressing unless you have been taught to do so and have shown you are able to change it safely. Flush the PICC as told. Tell your health care provider right away if the PICC is hard to flush or does not flush. Do not use force to flush the PICC. Use clean and germ-free (sterile) supplies only. Keep the supplies in a dry place. Do not reuse needles, syringes, or any other supplies. Reusing supplies can lead to infection. Keep the PICC dressing dry and secure it with tape if the edges stop sticking to your skin. Check your PICC insertion site every day for signs of infection. Check for: Redness, swelling, or pain. Fluid or blood. Warmth. Pus or a bad smell. Preventing other problems Do not use a syringe that is less than 10 mL to flush the PICC. Do not have your blood pressure checked on the arm in which the PICC is placed. Do not ever pull or tug on the PICC. Keep it secured to your arm with tape or a stretch wrap when not in use. Do not take the PICC out yourself. Only a trained health care provider should remove the PICC. Keep pets and children away from your PICC. How to care for your PICC dressing Keep your PICC dressing clean and dry to prevent infection. Do not take baths, swim, or use a hot tub until your health care provider approves. Ask your health care provider if you can take   showers. You may only be allowed to take sponge baths. When you are allowed to shower: Ask your health care provider to teach you how to wrap the PICC. Cover the PICC with clear plastic wrap and tape to keep it dry while showering. Follow instructions from your health care provider about how to take care of your insertion site and dressing. Make sure you: Wash your hands with soap and water for at least 20 seconds before and after you change your dressing. If soap and water are not available, use hand sanitizer. Change your dressing only if taught to do so by your health care  provider. Your PICC dressing needs to be changed if it becomes loose or wet. Leave stitches (sutures), skin glue, or adhesive strips in place. These skin closures may need to stay in place for 2 weeks or longer. If adhesive strip edges start to loosen and curl up, you may trim the loose edges. Do not remove adhesive strips completely unless your health care provider tells you to do that. Follow these instructions at home: Disposal of supplies Throw away any syringes in a disposal container that is meant for sharp items (sharps container). You can buy a sharps container from a pharmacy, or you can make one by using an empty, hard plastic bottle with a lid. Place any used dressings or infusion bags into a plastic bag. Throw that bag in the trash. General instructions  Always carry your PICC identification card or wear a medical alert bracelet. Keep the tube clamped at all times, unless it is being used. Always carry a smooth-edge clamp with you to clamp the PICC if it breaks. Do not use scissors or sharp objects near the tube. You may bend your arm and move it freely. If your PICC is near or at the bend of your elbow, avoid activity with repeated motion at the elbow. Avoid lifting heavy objects as told by your health care provider. Keep all follow-up visits. This is important. You will need to have your PICC dressing changed at least once a week. Contact a health care provider if: You have pain in your arm, ear, face, or teeth. You have a fever or chills. You have redness, swelling, or pain around the insertion site. You have fluid or blood coming from the insertion site. Your insertion site feels warm to the touch. You have pus or a bad smell coming from the insertion site. Your skin feels hard and raised around the insertion site. Your PICC dressing has gotten wet or is coming off and you have not been taught how to change it. Get help right away if: You have problems with your PICC, such as  your PICC: Was tugged or pulled and has partially come out. Do not  push the PICC back in. Cannot be flushed, is hard to flush, or leaks around the insertion site when it is flushed. Makes a flushing sound when it is flushed. Appears to have a hole or tear. Is accidentally pulled all the way out. If this happens, cover the insertion site with a gauze dressing. Do not throw the PICC away. Your health care provider will need to check it to be sure the entire catheter came out. You feel your heart racing or skipping beats, or you have chest pain. You have shortness of breath or trouble breathing. You have swelling, redness, warmth, or pain in the arm in which the PICC is placed. You have a red streak going up your arm that   starts under the PICC dressing. These symptoms may be an emergency. Get help right away. Call 911. Do not wait to see if the symptoms will go away. Do not drive yourself to the hospital. Summary A peripherally inserted central catheter (PICC) is a long, thin, flexible tube (catheter) that is put into a vein in the arm or leg. If cared for properly, a PICC can remain in place for many months. Having a PICC can allow you to go home from the hospital sooner and continue treatment at home. The PICC is inserted using a germ-free (sterile) technique by a specially trained health care provider. Only a trained health care provider should remove it. Do not have your blood pressure checked on the arm in which your PICC is placed. Always keep your PICC identification card with you. This information is not intended to replace advice given to you by your health care provider. Make sure you discuss any questions you have with your health care provider. Document Revised: 12/10/2020 Document Reviewed: 12/10/2020 Elsevier Patient Education  2024 [employer].  

## 2022-12-18 ENCOUNTER — Other Ambulatory Visit: Payer: Self-pay

## 2022-12-21 ENCOUNTER — Other Ambulatory Visit: Payer: Self-pay

## 2022-12-22 ENCOUNTER — Inpatient Hospital Stay: Payer: Medicare HMO

## 2022-12-22 ENCOUNTER — Other Ambulatory Visit: Payer: Self-pay

## 2022-12-22 ENCOUNTER — Inpatient Hospital Stay: Payer: Medicare HMO | Admitting: Hematology & Oncology

## 2022-12-22 ENCOUNTER — Encounter: Payer: Self-pay | Admitting: Hematology & Oncology

## 2022-12-22 ENCOUNTER — Encounter (HOSPITAL_COMMUNITY): Payer: Self-pay | Admitting: Family Medicine

## 2022-12-22 ENCOUNTER — Emergency Department (HOSPITAL_BASED_OUTPATIENT_CLINIC_OR_DEPARTMENT_OTHER)
Admission: EM | Admit: 2022-12-22 | Discharge: 2022-12-22 | Disposition: A | Discharge status: Home / Self Care | Payer: Medicare HMO | Attending: Emergency Medicine | Admitting: Emergency Medicine

## 2022-12-22 ENCOUNTER — Encounter (HOSPITAL_BASED_OUTPATIENT_CLINIC_OR_DEPARTMENT_OTHER): Payer: Self-pay | Admitting: Emergency Medicine

## 2022-12-22 VITALS — BP 121/60 | HR 87 | Resp 14

## 2022-12-22 DIAGNOSIS — C160 Malignant neoplasm of cardia: Secondary | ICD-10-CM

## 2022-12-22 DIAGNOSIS — F19931 Other psychoactive substance use, unspecified with withdrawal delirium: Secondary | ICD-10-CM | POA: Insufficient documentation

## 2022-12-22 DIAGNOSIS — C169 Malignant neoplasm of stomach, unspecified: Secondary | ICD-10-CM | POA: Insufficient documentation

## 2022-12-22 DIAGNOSIS — R410 Disorientation, unspecified: Secondary | ICD-10-CM | POA: Insufficient documentation

## 2022-12-22 DIAGNOSIS — T424X5A Adverse effect of benzodiazepines, initial encounter: Secondary | ICD-10-CM | POA: Diagnosis not present

## 2022-12-22 DIAGNOSIS — M79604 Pain in right leg: Secondary | ICD-10-CM | POA: Diagnosis not present

## 2022-12-22 DIAGNOSIS — T402X5A Adverse effect of other opioids, initial encounter: Secondary | ICD-10-CM | POA: Insufficient documentation

## 2022-12-22 DIAGNOSIS — M79605 Pain in left leg: Secondary | ICD-10-CM | POA: Insufficient documentation

## 2022-12-22 DIAGNOSIS — R451 Restlessness and agitation: Secondary | ICD-10-CM | POA: Diagnosis not present

## 2022-12-22 DIAGNOSIS — Z452 Encounter for adjustment and management of vascular access device: Secondary | ICD-10-CM

## 2022-12-22 DIAGNOSIS — F10231 Alcohol dependence with withdrawal delirium: Secondary | ICD-10-CM | POA: Diagnosis not present

## 2022-12-22 DIAGNOSIS — T887XXA Unspecified adverse effect of drug or medicament, initial encounter: Secondary | ICD-10-CM | POA: Insufficient documentation

## 2022-12-22 LAB — CMP (CANCER CENTER ONLY)
ALT: 15 U/L (ref 0–44)
AST: 16 U/L (ref 15–41)
Albumin: 4.2 g/dL (ref 3.5–5.0)
Alkaline Phosphatase: 90 U/L (ref 38–126)
Anion gap: 7 (ref 5–15)
BUN: 19 mg/dL (ref 8–23)
CO2: 29 mmol/L (ref 22–32)
Calcium: 9.7 mg/dL (ref 8.9–10.3)
Chloride: 102 mmol/L (ref 98–111)
Creatinine: 0.93 mg/dL (ref 0.61–1.24)
GFR, Estimated: >60 mL/min (ref >60)
Glucose, Bld: 96 mg/dL (ref 70–99)
Potassium: 4.1 mmol/L (ref 3.5–5.1)
Sodium: 138 mmol/L (ref 135–145)
Total Bilirubin: 0.5 mg/dL (ref 0.3–1.2)
Total Protein: 7 g/dL (ref 6.5–8.1)

## 2022-12-22 LAB — CBC WITH DIFFERENTIAL (CANCER CENTER ONLY)
Abs Immature Granulocytes: 0.02 10*3/uL (ref 0.00–0.07)
Basophils Absolute: 0.1 10*3/uL (ref 0.0–0.1)
Basophils Relative: 1 %
Eosinophils Absolute: 0.4 10*3/uL (ref 0.0–0.5)
Eosinophils Relative: 5 %
HCT: 41.3 % (ref 39.0–52.0)
Hemoglobin: 13.4 g/dL (ref 13.0–17.0)
Immature Granulocytes: 0 %
Lymphocytes Relative: 13 %
Lymphs Abs: 1.1 10*3/uL (ref 0.7–4.0)
MCH: 29.2 pg (ref 26.0–34.0)
MCHC: 32.4 g/dL (ref 30.0–36.0)
MCV: 90 fL (ref 80.0–100.0)
Monocytes Absolute: 0.7 10*3/uL (ref 0.1–1.0)
Monocytes Relative: 9 %
Neutro Abs: 5.8 10*3/uL (ref 1.7–7.7)
Neutrophils Relative %: 72 %
Platelet Count: 310 10*3/uL (ref 150–400)
RBC: 4.59 MIL/uL (ref 4.22–5.81)
RDW: 13.7 % (ref 11.5–15.5)
WBC Count: 8.1 10*3/uL (ref 4.0–10.5)
nRBC: 0 % (ref 0.0–0.2)

## 2022-12-22 LAB — LACTATE DEHYDROGENASE: LDH: 119 U/L (ref 98–192)

## 2022-12-22 LAB — TSH: TSH: 3.438 u[IU]/mL (ref 0.350–4.500)

## 2022-12-22 MED ORDER — MORPHINE SULFATE (PF) 4 MG/ML IV SOLN
4.0000 mg | Freq: Once | INTRAVENOUS | Status: DC
  Filled 2022-12-22: qty 1

## 2022-12-22 MED ORDER — HYDROMORPHONE HCL 1 MG/ML IJ SOLN
1.0000 mg | Freq: Once | INTRAMUSCULAR | Status: AC
  Administered 2022-12-22: 1 mg via INTRAVENOUS
  Filled 2022-12-22: qty 1

## 2022-12-22 MED ORDER — SODIUM CHLORIDE 0.9% FLUSH
10.0000 mL | Freq: Once | INTRAVENOUS | Status: AC
  Administered 2022-12-22: 10 mL via INTRAVENOUS

## 2022-12-22 MED ORDER — SODIUM CHLORIDE 0.9 % IV SOLN: Freq: Once | INTRAVENOUS | Status: AC

## 2022-12-22 MED ORDER — SODIUM CHLORIDE 0.9 % IV SOLN: INTRAVENOUS | Status: DC | PRN

## 2022-12-22 MED ORDER — NALOXONE HCL 2 MG/2ML IJ SOSY
2.0000 mg | PREFILLED_SYRINGE | Freq: Once | INTRAMUSCULAR | Status: DC
  Filled 2022-12-22: qty 2

## 2022-12-22 MED ORDER — DIAZEPAM 5 MG/ML IJ SOLN
2.5000 mg | Freq: Once | INTRAMUSCULAR | Status: AC
  Administered 2022-12-22: 2.5 mg via INTRAVENOUS
  Filled 2022-12-22: qty 2

## 2022-12-22 MED ORDER — FLUMAZENIL 0.5 MG/5ML IV SOLN
0.4000 mg | Freq: Once | INTRAVENOUS | Status: AC
  Administered 2022-12-22: 2 mg via INTRAVENOUS

## 2022-12-22 MED ORDER — HYDROMORPHONE HCL 1 MG/ML IJ SOLN
0.5000 mg | Freq: Once | INTRAMUSCULAR | Status: AC
  Administered 2022-12-22: 0.5 mg via INTRAVENOUS
  Filled 2022-12-22: qty 1

## 2022-12-22 MED ORDER — SODIUM CHLORIDE 0.9 % IV SOLN
240.0000 mg | INTRAVENOUS | Status: DC
  Filled 2022-12-22: qty 24

## 2022-12-22 MED ORDER — DIAZEPAM 5 MG/ML IJ SOLN
10.0000 mg | Freq: Once | INTRAMUSCULAR | Status: AC
  Administered 2022-12-22: 10 mg via INTRAVENOUS
  Filled 2022-12-22: qty 2

## 2022-12-22 MED ORDER — DIPHENHYDRAMINE HCL 50 MG/ML IJ SOLN
25.0000 mg | Freq: Once | INTRAMUSCULAR | Status: AC
  Administered 2022-12-22: 25 mg via INTRAVENOUS
  Filled 2022-12-22: qty 1

## 2022-12-22 MED ORDER — NALOXONE HCL 2 MG/2ML IJ SOSY
PREFILLED_SYRINGE | INTRAMUSCULAR | Status: AC
  Administered 2022-12-22: 2 mg
  Filled 2022-12-22: qty 2

## 2022-12-22 NOTE — Progress Notes (Addendum)
Dilaudid administered per Dr. Myna Hidalgo order. Patient tolerated well. While pushing the 10 mg Valium patient became sluggish in responsiveness to verbal commands. This RN stopped pushing the valium after approximately half the dose was administered. IV normal saline wide open, vital signs obtained, oxygen administered, Dr. Myna Hidalgo notified.   1155 Dr. Myna Hidalgo chairside order given and carried out for Narcan 2 mg IVP.   1156 Patient becomes more responsive to verbal command.   1200 Patient starts  have involuntary hand and leg movements with jerking and jumping.   1205 Order given and carried out for Flumazenil (per package insert) per Dr. Myna Hidalgo.   1205 2nd dose of Flumazenil given per package insert per Verbal order of Dr. Myna Hidalgo for the continued involuntary arm and leg movement which included jerking and jumping actions.   1210 Hold Chemotherapy. Nursing instructed to transport patient to ED via wheelchair.   1215 Patient transported to ED via wheelchair with wife and 2nd nurse accompanying and report given to triage nurse.

## 2022-12-22 NOTE — Discharge Instructions (Addendum)
It was our pleasure to provide your ER care today - we hope that you feel better.  Drink plenty of fluids/stay well hydrated.   Follow up with your doctor in the coming week. Also follow up with your doctor regarding your blood pressure that is high today.   Return to ER if worse, symptoms recur, new/severe pain, fevers, persistent vomiting, trouble breathing, or other concern.   You were given medications in the ER - no driving for the next 8 hours.

## 2022-12-22 NOTE — Progress Notes (Signed)
Ma'am yes no surgery yes ma'am good Hematology and Oncology Follow Up Visit  George Dyer [id number] 10/06/1940 82 y.o. 12/22/2022   Principle Diagnosis:  Stage IIB (T3N0M0) Gastric cancer -- declines surgery -recurrent-  HER2(-)/ BRAF (+)  Current Therapy:   FOLFOX - s/p cycle #4-  start on 09/15/2021 XRT/Xeloda -- start on 12/07/2021 --completed on 01/12/2022 FOLFIRI/Opdivo -- st/p cycle #1 on 10/19/2022 -- d/c FOLFIRI on 11/02/2022 --changed Opdivo to 21-day cycles on 12/22/2018     Interim History:  George Dyer is back for follow-up.  George Dyer had his PET scan done on 12/17/2022.  The PET scan surprisingly showed that everything was absolutely stable.  George Dyer still has the area of hypermetabolism in the gastric cardia.  This did not change at all.  The SUV was 9.1.  George Dyer had no adenopathy.  His liver looks fine.  The bones look fine.  As such, I do think that immunotherapy is helping him.  George Dyer has tolerated immunotherapy well.  Think we can probably move the Opdivo to every 3 weeks right now.  George Dyer has had no problems with nausea or vomiting.  His problem continues to be his legs.  George Dyer has horrible neuropathy which has been chronic for him.  Nothing seems to help the neuropathy.  George Dyer is on pain medication for this.  George Dyer is not hydrating as George Dyer should according to his wife.  We can certainly give him some IV fluid while George Dyer is here today.    Apparently, the radiologist noted that the PICC line was in the wrong position.  It is now of the left superior intercostal vein.  Will have to get IR to reposition this.  George Dyer has had no fever.  There has been no bleeding.  Overall, I would say that his performance status is probably ECOG 1-2.    Medications:  Current Outpatient Medications:    levothyroxine (SYNTHROID) 100 MCG tablet, Take 1 tablet (100 mcg total) by mouth daily before breakfast., Disp: 30 tablet, Rfl: 2   acetaminophen (TYLENOL) 325 MG tablet, Take 650 mg by mouth 2 (two) times daily as  needed., Disp: , Rfl:    alum & mag hydroxide-simeth (MAALOX/MYLANTA) 200-200-20 MG/5ML suspension, Take 30 mLs by mouth daily., Disp: , Rfl:    dexamethasone (DECADRON) 4 MG tablet, Take 2 tablets (8 mg total) by mouth daily. Start the day after chemotherapy for 2 days. Take with food., Disp: 8 tablet, Rfl: 5   dronabinol (MARINOL) 2.5 MG capsule, Take 1 capsule (2.5 mg total) by mouth 2 (two) times daily before a meal. (Patient not taking: Reported on 10/19/2022), Disp: 60 capsule, Rfl: 0   DULoxetine (CYMBALTA) 30 MG capsule, Take 2 capsules (60 mg total) by mouth at bedtime. Take 1 capsule (30 mg) for the 1st week, then increase to 2 capsules thereafter. (Patient not taking: Reported on 10/19/2022), Disp: 60 capsule, Rfl: 3   Heparin Na, Pork, Lock Flsh PF 100 UNIT/ML SOLN, Flush each PICC lumen with 2.5 mLs (250 Units total) as directed every Monday, Wednesday, and Friday., Disp: 120 mL, Rfl: 1   lactulose (CHRONULAC) 10 GM/15ML solution, Take 30 mLs (20 g total) by mouth 3 (three) times daily., Disp: 1000 mL, Rfl: 4   loperamide (IMODIUM) 2 MG capsule, Take 2 tabs by mouth with first loose stool, then 1 tab with each additional loose stool as needed. Do not exceed 8 tabs in a 24-hour period, Disp: 100 capsule, Rfl: 3   megestrol (MEGACE ES) 625  MG/5ML suspension, Take 5 mLs (625 mg total) by mouth daily. (Patient not taking: Reported on 10/19/2022), Disp: 150 mL, Rfl: 4   megestrol (MEGACE) 400 MG/10ML suspension, Take 10 mLs (400 mg total) by mouth 2 (two) times daily. (Patient not taking: Reported on 10/19/2022), Disp: 240 mL, Rfl: 0   metFORMIN (GLUCOPHAGE) 500 MG tablet, Take 1 tablet (500 mg total) by mouth daily with breakfast. (Patient not taking: Reported on 10/19/2022), Disp: 90 tablet, Rfl: 3   omeprazole (PRILOSEC) 40 MG capsule, TAKE 1 CAPSULE (40 MG TOTAL) BY MOUTH DAILY., Disp: 90 capsule, Rfl: 1   ondansetron (ZOFRAN) 8 MG tablet, Take 1 tablet (8 mg total) by mouth every 8 (eight) hours  as needed for nausea, vomiting or refractory nausea / vomiting. Start on the third day after chemotherapy., Disp: 30 tablet, Rfl: 1   polyethylene glycol (MIRALAX / GLYCOLAX) 17 g packet, Take 17 g by mouth daily., Disp: , Rfl:    prochlorperazine (COMPAZINE) 10 MG tablet, Take 1 tablet (10 mg total) by mouth every 6 (six) hours as needed for nausea or vomiting., Disp: 30 tablet, Rfl: 1   sodium chloride flush (NS) 0.9 % SOLN, Inject 10 mLs into each port 3 (three) times a week. Flush each port of PICC Monday, Wednesday and Friday., Disp: 240 mL, Rfl: 1   temazepam (RESTORIL) 7.5 MG capsule, Take 1 capsule (7.5 mg total) by mouth at bedtime as needed for sleep., Disp: 30 capsule, Rfl: 0  Current Facility-Administered Medications:    alteplase (CATHFLO ACTIVASE) injection 2 mg, 2 mg, Intracatheter, Once, Krishawna Stiefel, Rose Phi, MD   alteplase (CATHFLO ACTIVASE) injection 2 mg, 2 mg, Intracatheter, Once, Cassity Christian, Rose Phi, MD  Facility-Administered Medications Ordered in Other Visits:    0.9 %  sodium chloride infusion, , Intravenous, Once, Jimmie Rueter, Rose Phi, MD   heparin lock flush 100 unit/mL, 500 Units, Intravenous, Once, Omir Cooprider, Rose Phi, MD   HYDROcodone-acetaminophen (NORCO/VICODIN) 5-325 MG per tablet 1 tablet, 1 tablet, Oral, Once, Erenest Blank, NP   nivolumab (OPDIVO) 240 mg in sodium chloride 0.9 % 100 mL chemo infusion, 240 mg, Intravenous, Q14 Days, Dacian Orrico, Rose Phi, MD   sodium chloride flush (NS) 0.9 % injection 10 mL, 10 mL, Intravenous, Once, Leitha Hyppolite, Rose Phi, MD  Allergies:  Allergies  Allergen Reactions   Percocet [Oxycodone-Acetaminophen] Nausea And Vomiting   Oxycodone Other (See Comments)   Oxycodone-Aspirin Nausea And Vomiting    Past Medical History, Surgical history, Social history, and Family History were reviewed and updated.  Review of Systems: Review of Systems  Constitutional:  Positive for fatigue.  HENT:  Negative.    Eyes: Negative.   Respiratory: Negative.     Cardiovascular: Negative.   Gastrointestinal:  Positive for nausea.  Endocrine: Negative.   Genitourinary: Negative.    Musculoskeletal: Negative.   Skin: Negative.   Neurological: Negative.   Hematological: Negative.   Psychiatric/Behavioral: Negative.      Physical Exam:  height is 5\' 9"  (1.753 m) and weight is 145 lb (65.8 kg). His oral temperature is 98 F (36.7 C). His blood pressure is 147/78 (abnormal) and his pulse is 86. His respiration is 19 and oxygen saturation is 100%.   Wt Readings from Last 3 Encounters:  12/22/22 145 lb (65.8 kg)  12/07/22 145 lb (65.8 kg)  11/16/22 143 lb (64.9 kg)    Physical Exam Vitals reviewed.  HENT:     Head: Normocephalic and atraumatic.  Eyes:     Pupils: Pupils  are equal, round, and reactive to light.  Cardiovascular:     Rate and Rhythm: Normal rate and regular rhythm.     Heart sounds: Normal heart sounds.  Pulmonary:     Effort: Pulmonary effort is normal.     Breath sounds: Normal breath sounds.  Abdominal:     General: Bowel sounds are normal.     Palpations: Abdomen is soft.  Musculoskeletal:        General: No tenderness or deformity. Normal range of motion.     Cervical back: Normal range of motion.  Lymphadenopathy:     Cervical: No cervical adenopathy.  Skin:    General: Skin is warm and dry.     Findings: No erythema or rash.  Neurological:     Mental Status: George Dyer is alert and oriented to person, place, and time.  Psychiatric:        Behavior: Behavior normal.        Thought Content: Thought content normal.        Judgment: Judgment normal.    Lab Results  Component Value Date   WBC 8.1 12/22/2022   HGB 13.4 12/22/2022   HCT 41.3 12/22/2022   MCV 90.0 12/22/2022   PLT 310 12/22/2022     Chemistry      Component Value Date/Time   NA 138 12/22/2022 0905   NA 139 07/23/2020 0818   K 4.1 12/22/2022 0905   CL 102 12/22/2022 0905   CO2 29 12/22/2022 0905   BUN 19 12/22/2022 0905   BUN 10 07/23/2020  0818   CREATININE 0.93 12/22/2022 0905   CREATININE 1.06 06/27/2015 0837      Component Value Date/Time   CALCIUM 9.7 12/22/2022 0905   ALKPHOS 90 12/22/2022 0905   AST 16 12/22/2022 0905   ALT 15 12/22/2022 0905   BILITOT 0.5 12/22/2022 0905      Impression and Plan: George Dyer is a very nice 82 year old white male.  Has a history of localized gastric cancer.  So far, George Dyer has not consented to any surgery.  I think that surgery probably would have the best chance to get him into remission and potentially cure this.  I am glad that the PET scan showed everything to be stable.  George Dyer still is not interested in surgery.  We will continue the immunotherapy.  Will try to move his appointments to every 3 weeks right now.  We will have to get a IR to see about the PICC line repositioning.  We will plan to get him back in 3 weeks.  I probably do not think George Dyer needs another PET scan probably for another 3 in 4 months.  Josph Macho, MD 7/17/202411:28 AM

## 2022-12-22 NOTE — ED Provider Notes (Signed)
[facility] [facility] Provider Note   CSN: [id number] Arrival date & time: 12/22/22  1217     History  Chief Complaint  Patient presents with   Medication Reaction    WOODIE DEGRAFFENREID is a 82 y.o. male.  Pt with hx gastric cancer, presents from oncology office where he was seen and treated today. Pt/family indicates was c/o ongoing pain to legs, and was given dilaudid and valium iv. Pt then became unresponsive.  They gave patient narcan and romazicon, pt became more alert/more responsive, but then became agitated. On arrival to ED, pt calling out in pain, saying that everything is hurting. Pt limited historian - level 5 caveat.  Family/spouse indicates no regular bzd use at home, but does take pain medication/ultram regularly.   The history is provided by the patient, medical records, the spouse and a caregiver. The history is limited by the condition of the patient.       Home Medications Prior to Admission medications   Medication Sig Start Date End Date Taking? Authorizing Provider  levothyroxine (SYNTHROID) 100 MCG tablet Take 1 tablet (100 mcg total) by mouth daily before breakfast. 12/07/22 01/06/23  Josph Macho, MD  acetaminophen (TYLENOL) 325 MG tablet Take 650 mg by mouth 2 (two) times daily as needed.    [provider]  alum & mag hydroxide-simeth (MAALOX/MYLANTA) 200-200-20 MG/5ML suspension Take 30 mLs by mouth daily.    [provider]  dexamethasone (DECADRON) 4 MG tablet Take 2 tablets (8 mg total) by mouth daily. Start the day after chemotherapy for 2 days. Take with food. 10/19/22   Josph Macho, MD  dronabinol (MARINOL) 2.5 MG capsule Take 1 capsule (2.5 mg total) by mouth 2 (two) times daily before a meal. Patient not taking: Reported on 10/19/2022 08/13/22   Josph Macho, MD  DULoxetine (CYMBALTA) 30 MG capsule Take 2 capsules (60 mg total) by mouth at bedtime. Take 1 capsule (30 mg) for the 1st week,  then increase to 2 capsules thereafter. Patient not taking: Reported on 10/19/2022 05/05/22   Antony Madura, MD  Heparin Na, Pork, Lock Flsh PF 100 UNIT/ML SOLN Flush each PICC lumen with 2.5 mLs (250 Units total) as directed every Monday, Wednesday, and Friday. 10/20/22   Josph Macho, MD  lactulose (CHRONULAC) 10 GM/15ML solution Take 30 mLs (20 g total) by mouth 3 (three) times daily. 10/19/22   Josph Macho, MD  loperamide (IMODIUM) 2 MG capsule Take 2 tabs by mouth with first loose stool, then 1 tab with each additional loose stool as needed. Do not exceed 8 tabs in a 24-hour period 10/19/22   Josph Macho, MD  megestrol (MEGACE ES) 625 MG/5ML suspension Take 5 mLs (625 mg total) by mouth daily. Patient not taking: Reported on 10/19/2022 09/09/22   Josph Macho, MD  megestrol (MEGACE) 400 MG/10ML suspension Take 10 mLs (400 mg total) by mouth 2 (two) times daily. Patient not taking: Reported on 10/19/2022 09/13/22   Josph Macho, MD  metFORMIN (GLUCOPHAGE) 500 MG tablet Take 1 tablet (500 mg total) by mouth daily with breakfast. Patient not taking: Reported on 10/19/2022 07/22/22   Horton Chin, MD  omeprazole (PRILOSEC) 40 MG capsule TAKE 1 CAPSULE (40 MG TOTAL) BY MOUTH DAILY. 09/06/22   Lynann Bologna, MD  ondansetron (ZOFRAN) 8 MG tablet Take 1 tablet (8 mg total) by mouth every 8 (eight) hours as needed for nausea, vomiting or  refractory nausea / vomiting. Start on the third day after chemotherapy. 10/19/22   Josph Macho, MD  polyethylene glycol (MIRALAX / GLYCOLAX) 17 g packet Take 17 g by mouth daily.    [provider]  prochlorperazine (COMPAZINE) 10 MG tablet Take 1 tablet (10 mg total) by mouth every 6 (six) hours as needed for nausea or vomiting. 10/19/22   Josph Macho, MD  sodium chloride flush (NS) 0.9 % SOLN Inject 10 mLs into each port 3 (three) times a week. Flush each port of PICC Monday, Wednesday and Friday. 10/20/22   Josph Macho, MD   temazepam (RESTORIL) 7.5 MG capsule Take 1 capsule (7.5 mg total) by mouth at bedtime as needed for sleep. 12/17/22   Josph Macho, MD      Allergies    Percocet [oxycodone-acetaminophen], Oxycodone, and Oxycodone-aspirin    Review of Systems   Review of Systems  Constitutional:  Negative for chills and fever.  Respiratory:  Negative for cough and shortness of breath.   Cardiovascular:  Negative for chest pain.  Gastrointestinal:  Negative for abdominal pain, diarrhea and vomiting.  Genitourinary:  Negative for dysuria.  Musculoskeletal:  Positive for myalgias.  Skin:  Negative for rash.  Neurological:  Negative for headaches.  Psychiatric/Behavioral:  Positive for agitation.     Physical Exam Updated Vital Signs BP (!) 180/80   Pulse (!) 111   Resp (!) 22   SpO2 96%  Physical Exam Vitals and nursing note reviewed.  Constitutional:      Appearance: Normal appearance. He is well-developed.  HENT:     Head: Atraumatic.     Nose: Nose normal.     Mouth/Throat:     Mouth: Mucous membranes are moist.     Pharynx: Oropharynx is clear.  Eyes:     General: No scleral icterus.    Conjunctiva/sclera: Conjunctivae normal.     Pupils: Pupils are equal, round, and reactive to light.  Neck:     Trachea: No tracheal deviation.  Cardiovascular:     Rate and Rhythm: Normal rate and regular rhythm.     Pulses: Normal pulses.     Heart sounds: Normal heart sounds. No murmur heard.    No friction rub. No gallop.  Pulmonary:     Effort: Pulmonary effort is normal. No accessory muscle usage or respiratory distress.     Breath sounds: Normal breath sounds.  Abdominal:     General: Bowel sounds are normal. There is no distension.     Palpations: Abdomen is soft.     Tenderness: There is no abdominal tenderness. There is no guarding.  Genitourinary:    Comments: No cva tenderness. Musculoskeletal:        General: No swelling.     Cervical back: Normal range of motion and neck  supple. No rigidity.  Skin:    General: Skin is warm and dry.     Findings: No rash.  Neurological:     Mental Status: He is alert.     Comments: Alert, speech clear. Motor/sens grossly intact bil.      ED Results / Procedures / Treatments   Labs (all labs ordered are listed, but only abnormal results are displayed) Labs Reviewed - No data to display  EKG None  Radiology No results found.  Procedures Procedures    Medications Ordered in ED Medications  diphenhydrAMINE (BENADRYL) injection 25 mg (has no administration in time range)  HYDROmorphone (DILAUDID) injection 0.5 mg (has no  administration in time range)    ED Course/ Medical Decision Making/ A&P                             Medical Decision Making Problems Addressed: Acute drug withdrawal syndrome with delirium [facility]): acute illness or injury with systemic symptoms that poses a threat to life or bodily functions Malignant neoplasm of stomach, unspecified location [facility]): chronic illness or injury that poses a threat to life or bodily functions Medication side effect: acute illness or injury with systemic symptoms that poses a threat to life or bodily functions  Amount and/or Complexity of Data Reviewed Independent Historian: spouse    Details: Spouse/oncology, hx External Data Reviewed: labs and notes. Labs:  Decision-making details documented in ED Course.  Risk Prescription drug management. Parenteral controlled substances. Decision regarding hospitalization.   Iv ns. Continuous pulse ox and cardiac monitoring.   Differential diagnosis includes  . Dispo decision including potential need for admission considered - will check labs and reassess.   Reviewed nursing notes and prior charts for additional history. External reports reviewed. Additional history from: family/spouse.   Cardiac monitor: sinus rhythm, rate 110.  Pt agitated/anxious, indicating entire body hurts. ?whether recent narcan/romazicon  has precipitated acute withdrawal symptoms vs side effect of med.   Recent labs from today reviewed/interpreted by me - chem normal.  Wbc and hgb normal.   Benadryl iv. Dilaudid iv.   Ivf.   Recheck appears anxious/agitated. Valium 2.5 mg iv.   Recheck, much calmer, no distress. Pt indicates feels back to normal and ready for d/c.   Po fluids/food. Recheck - earlier symptom remain resolved.   Pt currently appears stable for d/c.  Return precautions provided.   CRITICAL CARE RE: acute withdrawal syndrome with agitation/delirium after narcan/romazicon dosing, requiring parenteral tx.  Performed by: Suzi Roots Total critical care time: 45 minutes Critical care time was exclusive of separately billable procedures and treating other patients. Critical care was necessary to treat or prevent imminent or life-threatening deterioration. Critical care was time spent personally by me on the following activities: development of treatment plan with patient and/or surrogate as well as nursing, discussions with consultants, evaluation of patient's response to treatment, examination of patient, obtaining history from patient or surrogate, ordering and performing treatments and interventions, ordering and review of laboratory studies, ordering and review of radiographic studies, pulse oximetry and re-evaluation of patient's condition.          Final Clinical Impression(s) / ED Diagnoses Final diagnoses:  None    Rx / DC Orders ED Discharge Orders     None         Cathren Laine, MD 12/22/22 [phone]

## 2022-12-22 NOTE — ED Triage Notes (Signed)
Pt to ED from Dr. Gustavo Lah office with adverse medication reaction; pt received Dilaudid mg, then Valium 6mg , became unresponsive; then received Narcan, no response so they gave him Flumazenil; pt to ED with uncontrollable jerking, confusion and yelling out

## 2022-12-22 NOTE — Patient Instructions (Signed)
[facility]  Discharge Instructions: Thank you for choosing [facility] to provide your oncology and hematology care.   If you have a lab appointment with the Cancer Center, please go directly to the Cancer Center and check in at the registration area.  Wear comfortable clothing and clothing appropriate for easy access to any Portacath or PICC line.   We strive to give you quality time with your provider. You may need to reschedule your appointment if you arrive late (15 or more minutes).  Arriving late affects you and other patients whose appointments are after yours.  Also, if you miss three or more appointments without notifying the office, you may be dismissed from the clinic at the provider's discretion.      For prescription refill requests, have your pharmacy contact our office and allow 72 hours for refills to be completed.    Today you received the following chemotherapy and/or immunotherapy agents:  Nivolumab      To help prevent nausea and vomiting after your treatment, we encourage you to take your nausea medication as directed.  BELOW ARE SYMPTOMS THAT SHOULD BE REPORTED IMMEDIATELY: *FEVER GREATER THAN 100.4 F (38 C) OR HIGHER *CHILLS OR SWEATING *NAUSEA AND VOMITING THAT IS NOT CONTROLLED WITH YOUR NAUSEA MEDICATION *UNUSUAL SHORTNESS OF BREATH *UNUSUAL BRUISING OR BLEEDING *URINARY PROBLEMS (pain or burning when urinating, or frequent urination) *BOWEL PROBLEMS (unusual diarrhea, constipation, pain near the anus) TENDERNESS IN MOUTH AND THROAT WITH OR WITHOUT PRESENCE OF ULCERS (sore throat, sores in mouth, or a toothache) UNUSUAL RASH, SWELLING OR PAIN  UNUSUAL VAGINAL DISCHARGE OR ITCHING   Items with * indicate a potential emergency and should be followed up as soon as possible or go to the Emergency Department if any problems should occur.  Please show the CHEMOTHERAPY ALERT CARD or IMMUNOTHERAPY ALERT CARD at  check-in to the Emergency Department and triage nurse. Should you have questions after your visit or need to cancel or reschedule your appointment, please contact [facility]  [phone] and follow the prompts.  Office hours are 8:00 a.m. to 4:30 p.m. Monday - Friday. Please note that voicemails left after 4:00 p.m. may not be returned until the following business day.  We are closed weekends and major holidays. You have access to a nurse at all times for urgent questions. Please call the main number to the clinic [phone] and follow the prompts.  For any non-urgent questions, you may also contact your provider using MyChart. We now offer e-Visits for anyone 18 and older to request care online for non-urgent symptoms. For details visit [email or web address].   Also download the MyChart app! Go to the app store, search "MyChart", open the app, select [facility], and log in with your MyChart username and password.   

## 2022-12-28 ENCOUNTER — Other Ambulatory Visit: Payer: Self-pay | Admitting: Hematology & Oncology

## 2022-12-28 ENCOUNTER — Encounter: Payer: Self-pay | Admitting: Hematology & Oncology

## 2022-12-28 ENCOUNTER — Ambulatory Visit (HOSPITAL_COMMUNITY)
Admission: RE | Admit: 2022-12-28 | Discharge: 2022-12-28 | Disposition: A | Discharge status: Home / Self Care | Payer: Medicare HMO | Source: Ambulatory Visit | Attending: Hematology & Oncology | Admitting: Hematology & Oncology

## 2022-12-28 DIAGNOSIS — Z452 Encounter for adjustment and management of vascular access device: Secondary | ICD-10-CM | POA: Insufficient documentation

## 2022-12-28 DIAGNOSIS — C160 Malignant neoplasm of cardia: Secondary | ICD-10-CM

## 2022-12-28 DIAGNOSIS — T82528A Displacement of other cardiac and vascular devices and implants, initial encounter: Secondary | ICD-10-CM | POA: Diagnosis not present

## 2022-12-28 HISTORY — PX: IR VENO/EXT/UNI LEFT: IMG675

## 2022-12-28 MED ORDER — HEPARIN SOD (PORK) LOCK FLUSH 100 UNIT/ML IV SOLN
INTRAVENOUS | Status: AC
  Filled 2022-12-28: qty 5

## 2022-12-28 MED ORDER — IOHEXOL 300 MG/ML  SOLN
50.0000 mL | Freq: Once | INTRAMUSCULAR | Status: AC | PRN
  Administered 2022-12-28: 20 mL

## 2022-12-28 MED ORDER — LIDOCAINE HCL 1 % IJ SOLN
INTRAMUSCULAR | Status: AC
  Filled 2022-12-28: qty 20

## 2022-12-28 NOTE — Procedures (Signed)
Interventional Radiology Procedure Note  Procedure: picc exchg    Complications: None  Estimated Blood Loss:  0  Findings: Tip is left innominate vein. Chronic central svc occlusion    Sharen Counter, MD

## 2022-12-29 ENCOUNTER — Encounter (HOSPITAL_COMMUNITY): Payer: Self-pay | Admitting: Radiology

## 2023-01-03 DIAGNOSIS — I10 Essential (primary) hypertension: Secondary | ICD-10-CM | POA: Diagnosis not present

## 2023-01-03 DIAGNOSIS — R5383 Other fatigue: Secondary | ICD-10-CM | POA: Diagnosis not present

## 2023-01-03 DIAGNOSIS — T451X5A Adverse effect of antineoplastic and immunosuppressive drugs, initial encounter: Secondary | ICD-10-CM | POA: Diagnosis not present

## 2023-01-03 DIAGNOSIS — G2581 Restless legs syndrome: Secondary | ICD-10-CM | POA: Diagnosis not present

## 2023-01-03 DIAGNOSIS — R5381 Other malaise: Secondary | ICD-10-CM | POA: Diagnosis not present

## 2023-01-03 DIAGNOSIS — H9192 Unspecified hearing loss, left ear: Secondary | ICD-10-CM | POA: Diagnosis not present

## 2023-01-03 DIAGNOSIS — K219 Gastro-esophageal reflux disease without esophagitis: Secondary | ICD-10-CM | POA: Diagnosis not present

## 2023-01-03 DIAGNOSIS — H6692 Otitis media, unspecified, left ear: Secondary | ICD-10-CM | POA: Diagnosis not present

## 2023-01-03 DIAGNOSIS — G620 Drug-induced polyneuropathy: Secondary | ICD-10-CM | POA: Diagnosis not present

## 2023-01-04 ENCOUNTER — Inpatient Hospital Stay: Payer: Medicare HMO

## 2023-01-04 ENCOUNTER — Other Ambulatory Visit: Payer: Self-pay

## 2023-01-04 VITALS — BP 137/65 | HR 80 | Temp 98.2°F | Resp 20

## 2023-01-04 DIAGNOSIS — C160 Malignant neoplasm of cardia: Secondary | ICD-10-CM

## 2023-01-04 DIAGNOSIS — Z452 Encounter for adjustment and management of vascular access device: Secondary | ICD-10-CM | POA: Diagnosis not present

## 2023-01-04 DIAGNOSIS — C169 Malignant neoplasm of stomach, unspecified: Secondary | ICD-10-CM | POA: Diagnosis not present

## 2023-01-04 DIAGNOSIS — E038 Other specified hypothyroidism: Secondary | ICD-10-CM

## 2023-01-04 DIAGNOSIS — Z5112 Encounter for antineoplastic immunotherapy: Secondary | ICD-10-CM | POA: Diagnosis not present

## 2023-01-04 DIAGNOSIS — E860 Dehydration: Secondary | ICD-10-CM

## 2023-01-04 DIAGNOSIS — Z79899 Other long term (current) drug therapy: Secondary | ICD-10-CM | POA: Diagnosis not present

## 2023-01-04 DIAGNOSIS — G629 Polyneuropathy, unspecified: Secondary | ICD-10-CM | POA: Diagnosis not present

## 2023-01-04 LAB — CMP (CANCER CENTER ONLY)
ALT: 17 U/L (ref 0–44)
AST: 15 U/L (ref 15–41)
Albumin: 3.8 g/dL (ref 3.5–5.0)
Alkaline Phosphatase: 82 U/L (ref 38–126)
Anion gap: 6 (ref 5–15)
BUN: 26 mg/dL — ABNORMAL HIGH (ref 8–23)
CO2: 29 mmol/L (ref 22–32)
Calcium: 8.9 mg/dL (ref 8.9–10.3)
Chloride: 103 mmol/L (ref 98–111)
Creatinine: 1.11 mg/dL (ref 0.61–1.24)
GFR, Estimated: >60 mL/min (ref >60)
Glucose, Bld: 99 mg/dL (ref 70–99)
Potassium: 4.2 mmol/L (ref 3.5–5.1)
Sodium: 138 mmol/L (ref 135–145)
Total Bilirubin: 0.4 mg/dL (ref 0.3–1.2)
Total Protein: 6.5 g/dL (ref 6.5–8.1)

## 2023-01-04 LAB — CBC WITH DIFFERENTIAL (CANCER CENTER ONLY)
Abs Immature Granulocytes: 0.01 10*3/uL (ref 0.00–0.07)
Basophils Absolute: 0.1 10*3/uL (ref 0.0–0.1)
Basophils Relative: 1 %
Eosinophils Absolute: 0.4 10*3/uL (ref 0.0–0.5)
Eosinophils Relative: 7 %
HCT: 38.4 % — ABNORMAL LOW (ref 39.0–52.0)
Hemoglobin: 12.2 g/dL — ABNORMAL LOW (ref 13.0–17.0)
Immature Granulocytes: 0 %
Lymphocytes Relative: 16 %
Lymphs Abs: 1.1 10*3/uL (ref 0.7–4.0)
MCH: 28.8 pg (ref 26.0–34.0)
MCHC: 31.8 g/dL (ref 30.0–36.0)
MCV: 90.8 fL (ref 80.0–100.0)
Monocytes Absolute: 0.7 10*3/uL (ref 0.1–1.0)
Monocytes Relative: 11 %
Neutro Abs: 4.3 10*3/uL (ref 1.7–7.7)
Neutrophils Relative %: 65 %
Platelet Count: 295 10*3/uL (ref 150–400)
RBC: 4.23 MIL/uL (ref 4.22–5.81)
RDW: 13.9 % (ref 11.5–15.5)
WBC Count: 6.7 10*3/uL (ref 4.0–10.5)
nRBC: 0 % (ref 0.0–0.2)

## 2023-01-04 MED ORDER — SODIUM CHLORIDE 0.9 % IV SOLN: Freq: Once | INTRAVENOUS | Status: AC

## 2023-01-04 MED ORDER — SODIUM CHLORIDE 0.9% FLUSH
10.0000 mL | INTRAVENOUS | Status: DC | PRN
  Administered 2023-01-04: 10 mL

## 2023-01-04 MED ORDER — HEPARIN SOD (PORK) LOCK FLUSH 100 UNIT/ML IV SOLN
250.0000 [IU] | INTRAVENOUS | Status: AC | PRN
  Administered 2023-01-04: 250 [IU]

## 2023-01-04 MED ORDER — SODIUM CHLORIDE 0.9% FLUSH: 10.0000 mL | INTRAVENOUS | Status: DC | PRN

## 2023-01-04 MED ORDER — SODIUM CHLORIDE 0.9% FLUSH
10.0000 mL | INTRAVENOUS | Status: AC | PRN
  Administered 2023-01-04: 10 mL

## 2023-01-04 MED ORDER — HEPARIN SOD (PORK) LOCK FLUSH 100 UNIT/ML IV SOLN
250.0000 [IU] | Freq: Once | INTRAVENOUS | Status: AC | PRN
  Administered 2023-01-04: 250 [IU]

## 2023-01-04 MED ORDER — SODIUM CHLORIDE 0.9 % IV SOLN
240.0000 mg | INTRAVENOUS | Status: DC
  Administered 2023-01-04: 240 mg via INTRAVENOUS
  Filled 2023-01-04: qty 24

## 2023-01-04 NOTE — Patient Instructions (Signed)
Nivolumab Injection What is this medication? NIVOLUMAB (nye VOL ue mab) treats some types of cancer. It works by helping your immune system slow or stop the spread of cancer cells. It is a monoclonal antibody. This medicine may be used for other purposes; ask your health care provider or pharmacist if you have questions. COMMON BRAND NAME(S): Opdivo What should I tell my care team before I take this medication? They need to know if you have any of these conditions: Allogeneic stem cell transplant (uses someone else's stem cells) Autoimmune diseases, such as Crohn disease, ulcerative colitis, lupus History of chest radiation Nervous system problems, such as Guillain-Barre syndrome or myasthenia gravis Organ transplant An unusual or allergic reaction to nivolumab, other medications, foods, dyes, or preservatives Pregnant or trying to get pregnant Breast-feeding How should I use this medication? This medication is infused into a vein. It is given in a hospital or clinic setting. A special MedGuide will be given to you before each treatment. Be sure to read this information carefully each time. Talk to your care team about the use of this medication in children. While it may be prescribed for children as young as 12 years for selected conditions, precautions do apply. Overdosage: If you think you have taken too much of this medicine contact a poison control center or emergency room at once. NOTE: This medicine is only for you. Do not share this medicine with others. What if I miss a dose? Keep appointments for follow-up doses. It is important not to miss your dose. Call your care team if you are unable to keep an appointment. What may interact with this medication? Interactions have not been studied. This list may not describe all possible interactions. Give your health care provider a list of all the medicines, herbs, non-prescription drugs, or dietary supplements you use. Also tell them if you  smoke, drink alcohol, or use illegal drugs. Some items may interact with your medicine. What should I watch for while using this medication? Your condition will be monitored carefully while you are receiving this medication. You may need blood work while taking this medication. This medication may cause serious skin reactions. They can happen weeks to months after starting the medication. Contact your care team right away if you notice fevers or flu-like symptoms with a rash. The rash may be red or purple and then turn into blisters or peeling of the skin. You may also notice a red rash with swelling of the face, lips, or lymph nodes in your neck or under your arms. Tell your care team right away if you have any change in your eyesight. Talk to your care team if you are pregnant or think you might be pregnant. A negative pregnancy test is required before starting this medication. A reliable form of contraception is recommended while taking this medication and for 5 months after the last dose. Talk to your care team about effective forms of contraception. Do not breast-feed while taking this medication and for 5 months after the last dose. What side effects may I notice from receiving this medication? Side effects that you should report to your care team as soon as possible: Allergic reactions--skin rash, itching, hives, swelling of the face, lips, tongue, or throat Dry cough, shortness of breath or trouble breathing Eye pain, redness, irritation, or discharge with blurry or decreased vision Heart muscle inflammation--unusual weakness or fatigue, shortness of breath, chest pain, fast or irregular heartbeat, dizziness, swelling of the ankles, feet, or hands Hormone   gland problems--headache, sensitivity to light, unusual weakness or fatigue, dizziness, fast or irregular heartbeat, increased sensitivity to cold or heat, excessive sweating, constipation, hair loss, increased thirst or amount of urine,  tremors or shaking, irritability Infusion reactions--chest pain, shortness of breath or trouble breathing, feeling faint or lightheaded Kidney injury (glomerulonephritis)--decrease in the amount of urine, red or dark brown urine, foamy or bubbly urine, swelling of the ankles, hands, or feet Liver injury--right upper belly pain, loss of appetite, nausea, light-colored stool, dark yellow or brown urine, yellowing skin or eyes, unusual weakness or fatigue Pain, tingling, or numbness in the hands or feet, muscle weakness, change in vision, confusion or trouble speaking, loss of balance or coordination, trouble walking, seizures Rash, fever, and swollen lymph nodes Redness, blistering, peeling, or loosening of the skin, including inside the mouth Sudden or severe stomach pain, bloody diarrhea, fever, nausea, vomiting Side effects that usually do not require medical attention (report these to your care team if they continue or are bothersome): Bone, joint, or muscle pain Diarrhea Fatigue Loss of appetite Nausea Skin rash This list may not describe all possible side effects. Call your doctor for medical advice about side effects. You may report side effects to FDA at 1-800-FDA-1088. Where should I keep my medication? This medication is given in a hospital or clinic. It will not be stored at home. NOTE: This sheet is a summary. It may not cover all possible information. If you have questions about this medicine, talk to your doctor, pharmacist, or health care provider.  2024 Elsevier/Gold Standard (2021-09-21 00:00:00)  

## 2023-01-05 ENCOUNTER — Other Ambulatory Visit: Payer: Self-pay

## 2023-01-07 ENCOUNTER — Other Ambulatory Visit (HOSPITAL_BASED_OUTPATIENT_CLINIC_OR_DEPARTMENT_OTHER): Payer: Self-pay

## 2023-01-07 ENCOUNTER — Other Ambulatory Visit: Payer: Self-pay

## 2023-01-07 ENCOUNTER — Other Ambulatory Visit: Payer: Self-pay | Admitting: Hematology & Oncology

## 2023-01-07 MED ORDER — HEPARIN NA (PORK) LOCK FLSH PF 100 UNIT/ML IV SOLN
2.5000 mL | INTRAVENOUS | 1 refills | Status: DC
  Filled 2023-01-07: qty 120, 10d supply, fill #0
  Filled 2023-02-10: qty 120, 28d supply, fill #1

## 2023-01-07 MED ORDER — SODIUM CHLORIDE 0.9% FLUSH
10.0000 mL | INTRAVENOUS | 1 refills | Status: AC
Start: 2023-01-07 — End: ?
  Filled 2023-01-07: qty 240, 28d supply, fill #0
  Filled 2023-02-10: qty 240, 28d supply, fill #1

## 2023-01-10 ENCOUNTER — Other Ambulatory Visit (HOSPITAL_BASED_OUTPATIENT_CLINIC_OR_DEPARTMENT_OTHER): Payer: Self-pay

## 2023-01-12 ENCOUNTER — Encounter: Payer: Self-pay | Admitting: Hematology & Oncology

## 2023-01-13 ENCOUNTER — Other Ambulatory Visit: Payer: Self-pay | Admitting: Hematology & Oncology

## 2023-01-13 DIAGNOSIS — C160 Malignant neoplasm of cardia: Secondary | ICD-10-CM

## 2023-01-18 ENCOUNTER — Encounter: Payer: Self-pay | Admitting: Hematology & Oncology

## 2023-01-18 ENCOUNTER — Inpatient Hospital Stay: Payer: Medicare HMO

## 2023-01-18 ENCOUNTER — Other Ambulatory Visit: Payer: Self-pay | Admitting: Hematology & Oncology

## 2023-01-18 ENCOUNTER — Encounter: Payer: Self-pay | Admitting: Family

## 2023-01-18 ENCOUNTER — Inpatient Hospital Stay: Payer: Medicare HMO | Attending: Hematology & Oncology | Admitting: Family

## 2023-01-18 VITALS — BP 139/65 | HR 72 | Temp 97.9°F | Resp 18 | Wt 146.5 lb

## 2023-01-18 DIAGNOSIS — C160 Malignant neoplasm of cardia: Secondary | ICD-10-CM

## 2023-01-18 DIAGNOSIS — E038 Other specified hypothyroidism: Secondary | ICD-10-CM

## 2023-01-18 DIAGNOSIS — Z5112 Encounter for antineoplastic immunotherapy: Secondary | ICD-10-CM | POA: Insufficient documentation

## 2023-01-18 DIAGNOSIS — C169 Malignant neoplasm of stomach, unspecified: Secondary | ICD-10-CM | POA: Insufficient documentation

## 2023-01-18 LAB — CBC WITH DIFFERENTIAL (CANCER CENTER ONLY)
Abs Immature Granulocytes: 0.02 10*3/uL (ref 0.00–0.07)
Basophils Absolute: 0.1 10*3/uL (ref 0.0–0.1)
Basophils Relative: 1 %
Eosinophils Absolute: 0.2 10*3/uL (ref 0.0–0.5)
Eosinophils Relative: 5 %
HCT: 41 % (ref 39.0–52.0)
Hemoglobin: 13.1 g/dL (ref 13.0–17.0)
Immature Granulocytes: 0 %
Lymphocytes Relative: 18 %
Lymphs Abs: 0.8 10*3/uL (ref 0.7–4.0)
MCH: 29 pg (ref 26.0–34.0)
MCHC: 32 g/dL (ref 30.0–36.0)
MCV: 90.9 fL (ref 80.0–100.0)
Monocytes Absolute: 0.5 10*3/uL (ref 0.1–1.0)
Monocytes Relative: 11 %
Neutro Abs: 3.1 10*3/uL (ref 1.7–7.7)
Neutrophils Relative %: 65 %
Platelet Count: 288 10*3/uL (ref 150–400)
RBC: 4.51 MIL/uL (ref 4.22–5.81)
RDW: 13.2 % (ref 11.5–15.5)
WBC Count: 4.7 10*3/uL (ref 4.0–10.5)
nRBC: 0 % (ref 0.0–0.2)

## 2023-01-18 LAB — CMP (CANCER CENTER ONLY)
ALT: 15 U/L (ref 0–44)
AST: 13 U/L — ABNORMAL LOW (ref 15–41)
Albumin: 3.9 g/dL (ref 3.5–5.0)
Alkaline Phosphatase: 74 U/L (ref 38–126)
Anion gap: 5 (ref 5–15)
BUN: 18 mg/dL (ref 8–23)
CO2: 30 mmol/L (ref 22–32)
Calcium: 9 mg/dL (ref 8.9–10.3)
Chloride: 104 mmol/L (ref 98–111)
Creatinine: 0.99 mg/dL (ref 0.61–1.24)
GFR, Estimated: >60 mL/min (ref >60)
Glucose, Bld: 97 mg/dL (ref 70–99)
Potassium: 4.3 mmol/L (ref 3.5–5.1)
Sodium: 139 mmol/L (ref 135–145)
Total Bilirubin: 0.5 mg/dL (ref 0.3–1.2)
Total Protein: 6.6 g/dL (ref 6.5–8.1)

## 2023-01-18 MED ORDER — SODIUM CHLORIDE 0.9% FLUSH: 10.0000 mL | INTRAVENOUS | Status: DC | PRN

## 2023-01-18 MED ORDER — SODIUM CHLORIDE 0.9 % IV SOLN: Freq: Once | INTRAVENOUS | Status: AC

## 2023-01-18 MED ORDER — ALTEPLASE 2 MG IJ SOLR
2.0000 mg | Freq: Once | INTRAMUSCULAR | Status: AC | PRN
  Administered 2023-01-18: 2 mg
  Filled 2023-01-18: qty 2

## 2023-01-18 MED ORDER — SODIUM CHLORIDE 0.9 % IV SOLN
240.0000 mg | INTRAVENOUS | Status: DC
  Administered 2023-01-18: 240 mg via INTRAVENOUS
  Filled 2023-01-18: qty 24

## 2023-01-18 MED ORDER — HEPARIN SOD (PORK) LOCK FLUSH 100 UNIT/ML IV SOLN
250.0000 [IU] | Freq: Once | INTRAVENOUS | Status: AC | PRN
  Administered 2023-01-18: 250 [IU]

## 2023-01-18 MED ORDER — HYDROMORPHONE HCL 1 MG/ML IJ SOLN
1.0000 mg | Freq: Once | INTRAMUSCULAR | Status: AC
  Administered 2023-01-18: 1 mg via INTRAVENOUS
  Filled 2023-01-18: qty 1

## 2023-01-18 NOTE — Patient Instructions (Signed)
PICC Home Care Guide A peripherally inserted central catheter (PICC) is a form of IV access that allows medicines and IV fluids to be quickly put into the blood and spread throughout the body. The PICC is a long, thin, flexible tube (catheter) that is put into a vein in a person's arm or leg. The catheter ends in a large vein just outside the heart called the superior vena cava (SVC). After the PICC is put in, a chest X-ray may be done to make sure that it is in the right place. A PICC may be placed for different reasons, such as: To give medicines and liquid nutrition. To give IV fluids and blood products. To take blood samples often. If there is trouble placing a peripheral intravenous (PIV) catheter. If cared for properly, a PICC can remain in place for many months. Having a PICC can allow you to go home from the hospital sooner and continue treatment at home. Medicines and PICC care can be managed at home by a family member, caregiver, or home health care team. What are the risks? Generally, having a PICC is safe. However, problems may occur, including: A blood clot (thrombus) forming in or at the end of the PICC. A blood clot forming in a vein (deep vein thrombosis) or traveling to the lung (pulmonary embolism). Inflammation of the vein (phlebitis) in which the PICC is placed. Infection at the insertion site or in the blood. Blood infections from central lines, like PICCs, can be serious and often require a hospital stay. PICC malposition, or PICC movement or poor placement. A break or cut in the PICC. Do not use scissors near the PICC. Nerve or tendon irritation or injury during PICC insertion. How to care for your PICC Please follow the specific guidelines provided by your health care provider. Preventing infection You and any caregivers should wash your hands often with soap and water for at least 20 seconds. Wash hands: Before touching the PICC or the infusion device. Before changing a  bandage (dressing). Do not change the dressing unless you have been taught to do so and have shown you are able to change it safely. Flush the PICC as told. Tell your health care provider right away if the PICC is hard to flush or does not flush. Do not use force to flush the PICC. Use clean and germ-free (sterile) supplies only. Keep the supplies in a dry place. Do not reuse needles, syringes, or any other supplies. Reusing supplies can lead to infection. Keep the PICC dressing dry and secure it with tape if the edges stop sticking to your skin. Check your PICC insertion site every day for signs of infection. Check for: Redness, swelling, or pain. Fluid or blood. Warmth. Pus or a bad smell. Preventing other problems Do not use a syringe that is less than 10 mL to flush the PICC. Do not have your blood pressure checked on the arm in which the PICC is placed. Do not ever pull or tug on the PICC. Keep it secured to your arm with tape or a stretch wrap when not in use. Do not take the PICC out yourself. Only a trained health care provider should remove the PICC. Keep pets and children away from your PICC. How to care for your PICC dressing Keep your PICC dressing clean and dry to prevent infection. Do not take baths, swim, or use a hot tub until your health care provider approves. Ask your health care provider if you can take  showers. You may only be allowed to take sponge baths. When you are allowed to shower: Ask your health care provider to teach you how to wrap the PICC. Cover the PICC with clear plastic wrap and tape to keep it dry while showering. Follow instructions from your health care provider about how to take care of your insertion site and dressing. Make sure you: Wash your hands with soap and water for at least 20 seconds before and after you change your dressing. If soap and water are not available, use hand sanitizer. Change your dressing only if taught to do so by your health care  provider. Your PICC dressing needs to be changed if it becomes loose or wet. Leave stitches (sutures), skin glue, or adhesive strips in place. These skin closures may need to stay in place for 2 weeks or longer. If adhesive strip edges start to loosen and curl up, you may trim the loose edges. Do not remove adhesive strips completely unless your health care provider tells you to do that. Follow these instructions at home: Disposal of supplies Throw away any syringes in a disposal container that is meant for sharp items (sharps container). You can buy a sharps container from a pharmacy, or you can make one by using an empty, hard plastic bottle with a lid. Place any used dressings or infusion bags into a plastic bag. Throw that bag in the trash. General instructions  Always carry your PICC identification card or wear a medical alert bracelet. Keep the tube clamped at all times, unless it is being used. Always carry a smooth-edge clamp with you to clamp the PICC if it breaks. Do not use scissors or sharp objects near the tube. You may bend your arm and move it freely. If your PICC is near or at the bend of your elbow, avoid activity with repeated motion at the elbow. Avoid lifting heavy objects as told by your health care provider. Keep all follow-up visits. This is important. You will need to have your PICC dressing changed at least once a week. Contact a health care provider if: You have pain in your arm, ear, face, or teeth. You have a fever or chills. You have redness, swelling, or pain around the insertion site. You have fluid or blood coming from the insertion site. Your insertion site feels warm to the touch. You have pus or a bad smell coming from the insertion site. Your skin feels hard and raised around the insertion site. Your PICC dressing has gotten wet or is coming off and you have not been taught how to change it. Get help right away if: You have problems with your PICC, such as  your PICC: Was tugged or pulled and has partially come out. Do not  push the PICC back in. Cannot be flushed, is hard to flush, or leaks around the insertion site when it is flushed. Makes a flushing sound when it is flushed. Appears to have a hole or tear. Is accidentally pulled all the way out. If this happens, cover the insertion site with a gauze dressing. Do not throw the PICC away. Your health care provider will need to check it to be sure the entire catheter came out. You feel your heart racing or skipping beats, or you have chest pain. You have shortness of breath or trouble breathing. You have swelling, redness, warmth, or pain in the arm in which the PICC is placed. You have a red streak going up your arm that  starts under the PICC dressing. These symptoms may be an emergency. Get help right away. Call 911. Do not wait to see if the symptoms will go away. Do not drive yourself to the hospital. Summary A peripherally inserted central catheter (PICC) is a long, thin, flexible tube (catheter) that is put into a vein in the arm or leg. If cared for properly, a PICC can remain in place for many months. Having a PICC can allow you to go home from the hospital sooner and continue treatment at home. The PICC is inserted using a germ-free (sterile) technique by a specially trained health care provider. Only a trained health care provider should remove it. Do not have your blood pressure checked on the arm in which your PICC is placed. Always keep your PICC identification card with you. This information is not intended to replace advice given to you by your health care provider. Make sure you discuss any questions you have with your health care provider. Document Revised: 12/10/2020 Document Reviewed: 12/10/2020 Elsevier Patient Education  2024 [employer].

## 2023-01-18 NOTE — Progress Notes (Unsigned)
Hematology and Oncology Follow Up Visit  BRAYDN PICKLER [id number] 05/10/1941 82 y.o. 01/18/2023   Principle Diagnosis:  Stage IIB (T3N0M0) Gastric cancer -- declines surgery -recurrent-  HER2(-)/ BRAF (+)   Current Therapy:        FOLFOX - s/p cycle #4-  start on 09/15/2021 XRT/Xeloda -- start on 12/07/2021 --completed on 01/12/2022 FOLFIRI/Opdivo -- st/p cycle #1 on 10/19/2022 -- d/c FOLFIRI on 11/02/2022 --changed Opdivo to 21-day cycles on 12/22/2018   Interim History:  Mr. Levandowski is here today with his wife for follow-up and treatment. His only complaint at this time is the neuropathy in his lower extremities.  He states that despite taking the Tramadol his foot hurts constantly.  No swelling in his extremities at this time.  No falls or syncope reported.  No fever, chills, n/v, cough, rash, dizziness, SOB, chest pain, palpitations, abdominal pain or changes in bowel or bladder habits.  Appetite and hydration are good. He is drinking Ensure daily. Weight is 146 lbs.   ECOG Performance Status: 1 - Symptomatic but completely ambulatory  Medications:  Allergies as of 01/18/2023       Reactions   Percocet [oxycodone-acetaminophen] Nausea And Vomiting   Oxycodone Other (See Comments)   Oxycodone-aspirin Nausea And Vomiting        Medication List        Accurate as of January 18, 2023 10:59 AM. If you have any questions, ask your nurse or doctor.          acetaminophen 325 MG tablet Commonly known as: TYLENOL Take 650 mg by mouth 2 (two) times daily as needed.   alum & mag hydroxide-simeth 200-200-20 MG/5ML suspension Commonly known as: MAALOX/MYLANTA Take 30 mLs by mouth daily.   dexamethasone 4 MG tablet Commonly known as: DECADRON Take 2 tablets (8 mg total) by mouth daily. Start the day after chemotherapy for 2 days. Take with food.   dronabinol 2.5 MG capsule Commonly known as: MARINOL Take 1 capsule (2.5 mg total) by mouth 2 (two) times daily before a  meal.   DULoxetine 30 MG capsule Commonly known as: Cymbalta Take 2 capsules (60 mg total) by mouth at bedtime. Take 1 capsule (30 mg) for the 1st week, then increase to 2 capsules thereafter.   Heparin Na (Pork) Lock Flsh PF 100 UNIT/ML Soln Flush each PICC lumen with 2.5 mLs (250 Units total) as directed every Monday, Wednesday, and Friday.   lactulose 10 GM/15ML solution Commonly known as: CHRONULAC Take 30 mLs (20 g total) by mouth 3 (three) times daily.   levothyroxine 100 MCG tablet Commonly known as: Synthroid Take 1 tablet (100 mcg total) by mouth daily before breakfast.   loperamide 2 MG capsule Commonly known as: IMODIUM Take 2 tabs by mouth with first loose stool, then 1 tab with each additional loose stool as needed. Do not exceed 8 tabs in a 24-hour period   megestrol 40 MG/ML suspension Commonly known as: MEGACE TAKE 10 MLS (400 MG TOTAL) BY MOUTH 2 (TWO) TIMES DAILY.   megestrol 625 MG/5ML suspension Commonly known as: MEGACE ES Take 5 mLs (625 mg total) by mouth daily.   metFORMIN 500 MG tablet Commonly known as: GLUCOPHAGE Take 1 tablet (500 mg total) by mouth daily with breakfast.   Normal Saline Flush 0.9 % Soln Inject 10 mLs into each port 3 (three) times a week. Flush each port of PICC Monday, Wednesday and Friday.   omeprazole 40 MG capsule Commonly known as: PRILOSEC TAKE  1 CAPSULE (40 MG TOTAL) BY MOUTH DAILY.   ondansetron 8 MG tablet Commonly known as: ZOFRAN Take 1 tablet (8 mg total) by mouth every 8 (eight) hours as needed for nausea, vomiting or refractory nausea / vomiting. Start on the third day after chemotherapy.   polyethylene glycol 17 g packet Commonly known as: MIRALAX / GLYCOLAX Take 17 g by mouth daily.   prochlorperazine 10 MG tablet Commonly known as: COMPAZINE Take 1 tablet (10 mg total) by mouth every 6 (six) hours as needed for nausea or vomiting.   temazepam 7.5 MG capsule Commonly known as: RESTORIL Take 1 capsule  (7.5 mg total) by mouth at bedtime as needed for sleep.   traMADol 50 MG tablet Commonly known as: ULTRAM Take by mouth.        Allergies:  Allergies  Allergen Reactions   Percocet [Oxycodone-Acetaminophen] Nausea And Vomiting   Oxycodone Other (See Comments)   Oxycodone-Aspirin Nausea And Vomiting    Past Medical History, Surgical history, Social history, and Family History were reviewed and updated.  Review of Systems: All other 10 point review of systems is negative.   Physical Exam:  vitals were not taken for this visit.   Wt Readings from Last 3 Encounters:  12/22/22 145 lb (65.8 kg)  12/07/22 145 lb (65.8 kg)  11/16/22 143 lb (64.9 kg)    Ocular: Sclerae unicteric, pupils equal, round and reactive to light Ear-nose-throat: Oropharynx clear, dentition fair Lymphatic: No cervical or supraclavicular adenopathy Lungs no rales or rhonchi, good excursion bilaterally Heart regular rate and rhythm, no murmur appreciated Abd soft, nontender, positive bowel sounds MSK no focal spinal tenderness, no joint edema Neuro: non-focal, well-oriented, appropriate affect Breasts: Deferred   Lab Results  Component Value Date   WBC 4.7 01/18/2023   HGB 13.1 01/18/2023   HCT 41.0 01/18/2023   MCV 90.9 01/18/2023   PLT 288 01/18/2023   Lab Results  Component Value Date   FERRITIN 74 10/04/2022   IRON 93 10/04/2022   TIBC 279 10/04/2022   UIBC 186 10/04/2022   IRONPCTSAT 33 10/04/2022   Lab Results  Component Value Date   RETICCTPCT 0.7 08/18/2021   RBC 4.51 01/18/2023   No results found for: "KPAFRELGTCHN", "LAMBDASER", "KAPLAMBRATIO" Lab Results  Component Value Date   IGGSERUM 931 05/05/2022   IGMSERUM 59 05/05/2022   No results found for: "TOTALPROTELP", "ALBUMINELP", "A1GS", "A2GS", "BETS", "BETA2SER", "GAMS", "MSPIKE", "SPEI"   Chemistry      Component Value Date/Time   NA 138 01/04/2023 0900   NA 139 07/23/2020 0818   K 4.2 01/04/2023 0900   CL 103  01/04/2023 0900   CO2 29 01/04/2023 0900   BUN 26 (H) 01/04/2023 0900   BUN 10 07/23/2020 0818   CREATININE 1.11 01/04/2023 0900   CREATININE 1.06 06/27/2015 0837      Component Value Date/Time   CALCIUM 8.9 01/04/2023 0900   ALKPHOS 82 01/04/2023 0900   AST 15 01/04/2023 0900   ALT 17 01/04/2023 0900   BILITOT 0.4 01/04/2023 0900       Impression and Plan: Mr. Geathers is a very pleasant 82 yo caucasian gentleman with localized gastric cancer. So far, he has refused surgery to treat.  He is stable on Opdivo and tolerating nicely.  We will give him a dose of IV pain medicine today for right foot neuropathic pain.  We will proceed with treatment today as planned.  Follow-up in 3 weeks.   Eileen Stanford, NP 8/13/202410:59 AM

## 2023-01-18 NOTE — Progress Notes (Signed)
Cath flo placed in Red lumen due to lack of blood return.  Labwork drawn from right antecubital per Nurse. Excellent blood return noted in red lumen

## 2023-01-18 NOTE — Patient Instructions (Signed)
Nivolumab Injection What is this medication? NIVOLUMAB (nye VOL ue mab) treats some types of cancer. It works by helping your immune system slow or stop the spread of cancer cells. It is a monoclonal antibody. This medicine may be used for other purposes; ask your health care provider or pharmacist if you have questions. COMMON BRAND NAME(S): Opdivo What should I tell my care team before I take this medication? They need to know if you have any of these conditions: Allogeneic stem cell transplant (uses someone else's stem cells) Autoimmune diseases, such as Crohn disease, ulcerative colitis, lupus History of chest radiation Nervous system problems, such as Guillain-Barre syndrome or myasthenia gravis Organ transplant An unusual or allergic reaction to nivolumab, other medications, foods, dyes, or preservatives Pregnant or trying to get pregnant Breast-feeding How should I use this medication? This medication is infused into a vein. It is given in a hospital or clinic setting. A special MedGuide will be given to you before each treatment. Be sure to read this information carefully each time. Talk to your care team about the use of this medication in children. While it may be prescribed for children as young as 12 years for selected conditions, precautions do apply. Overdosage: If you think you have taken too much of this medicine contact a poison control center or emergency room at once. NOTE: This medicine is only for you. Do not share this medicine with others. What if I miss a dose? Keep appointments for follow-up doses. It is important not to miss your dose. Call your care team if you are unable to keep an appointment. What may interact with this medication? Interactions have not been studied. This list may not describe all possible interactions. Give your health care provider a list of all the medicines, herbs, non-prescription drugs, or dietary supplements you use. Also tell them if you  smoke, drink alcohol, or use illegal drugs. Some items may interact with your medicine. What should I watch for while using this medication? Your condition will be monitored carefully while you are receiving this medication. You may need blood work while taking this medication. This medication may cause serious skin reactions. They can happen weeks to months after starting the medication. Contact your care team right away if you notice fevers or flu-like symptoms with a rash. The rash may be red or purple and then turn into blisters or peeling of the skin. You may also notice a red rash with swelling of the face, lips, or lymph nodes in your neck or under your arms. Tell your care team right away if you have any change in your eyesight. Talk to your care team if you are pregnant or think you might be pregnant. A negative pregnancy test is required before starting this medication. A reliable form of contraception is recommended while taking this medication and for 5 months after the last dose. Talk to your care team about effective forms of contraception. Do not breast-feed while taking this medication and for 5 months after the last dose. What side effects may I notice from receiving this medication? Side effects that you should report to your care team as soon as possible: Allergic reactions--skin rash, itching, hives, swelling of the face, lips, tongue, or throat Dry cough, shortness of breath or trouble breathing Eye pain, redness, irritation, or discharge with blurry or decreased vision Heart muscle inflammation--unusual weakness or fatigue, shortness of breath, chest pain, fast or irregular heartbeat, dizziness, swelling of the ankles, feet, or hands Hormone   gland problems--headache, sensitivity to light, unusual weakness or fatigue, dizziness, fast or irregular heartbeat, increased sensitivity to cold or heat, excessive sweating, constipation, hair loss, increased thirst or amount of urine,  tremors or shaking, irritability Infusion reactions--chest pain, shortness of breath or trouble breathing, feeling faint or lightheaded Kidney injury (glomerulonephritis)--decrease in the amount of urine, red or dark brown urine, foamy or bubbly urine, swelling of the ankles, hands, or feet Liver injury--right upper belly pain, loss of appetite, nausea, light-colored stool, dark yellow or brown urine, yellowing skin or eyes, unusual weakness or fatigue Pain, tingling, or numbness in the hands or feet, muscle weakness, change in vision, confusion or trouble speaking, loss of balance or coordination, trouble walking, seizures Rash, fever, and swollen lymph nodes Redness, blistering, peeling, or loosening of the skin, including inside the mouth Sudden or severe stomach pain, bloody diarrhea, fever, nausea, vomiting Side effects that usually do not require medical attention (report these to your care team if they continue or are bothersome): Bone, joint, or muscle pain Diarrhea Fatigue Loss of appetite Nausea Skin rash This list may not describe all possible side effects. Call your doctor for medical advice about side effects. You may report side effects to FDA at 1-800-FDA-1088. Where should I keep my medication? This medication is given in a hospital or clinic. It will not be stored at home. NOTE: This sheet is a summary. It may not cover all possible information. If you have questions about this medicine, talk to your doctor, pharmacist, or health care provider.  2024 Elsevier/Gold Standard (2021-09-21 00:00:00)  

## 2023-01-19 ENCOUNTER — Encounter: Payer: Self-pay | Admitting: Hematology & Oncology

## 2023-01-21 ENCOUNTER — Encounter: Payer: Self-pay | Admitting: Hematology & Oncology

## 2023-01-25 ENCOUNTER — Other Ambulatory Visit: Payer: Self-pay

## 2023-01-27 ENCOUNTER — Encounter: Payer: Self-pay | Admitting: Hematology & Oncology

## 2023-01-28 ENCOUNTER — Inpatient Hospital Stay: Payer: Medicare HMO

## 2023-01-28 DIAGNOSIS — Z5112 Encounter for antineoplastic immunotherapy: Secondary | ICD-10-CM | POA: Diagnosis not present

## 2023-01-28 DIAGNOSIS — C169 Malignant neoplasm of stomach, unspecified: Secondary | ICD-10-CM | POA: Diagnosis not present

## 2023-01-28 NOTE — Patient Instructions (Signed)
PICC Home Care Guide A peripherally inserted central catheter (PICC) is a form of IV access that allows medicines and IV fluids to be quickly put into the blood and spread throughout the body. The PICC is a long, thin, flexible tube (catheter) that is put into a vein in a person's arm or leg. The catheter ends in a large vein just outside the heart called the superior vena cava (SVC). After the PICC is put in, a chest X-ray may be done to make sure that it is in the right place. A PICC may be placed for different reasons, such as: To give medicines and liquid nutrition. To give IV fluids and blood products. To take blood samples often. If there is trouble placing a peripheral intravenous (PIV) catheter. If cared for properly, a PICC can remain in place for many months. Having a PICC can allow you to go home from the hospital sooner and continue treatment at home. Medicines and PICC care can be managed at home by a family member, caregiver, or home health care team. What are the risks? Generally, having a PICC is safe. However, problems may occur, including: A blood clot (thrombus) forming in or at the end of the PICC. A blood clot forming in a vein (deep vein thrombosis) or traveling to the lung (pulmonary embolism). Inflammation of the vein (phlebitis) in which the PICC is placed. Infection at the insertion site or in the blood. Blood infections from central lines, like PICCs, can be serious and often require a hospital stay. PICC malposition, or PICC movement or poor placement. A break or cut in the PICC. Do not use scissors near the PICC. Nerve or tendon irritation or injury during PICC insertion. How to care for your PICC Please follow the specific guidelines provided by your health care provider. Preventing infection You and any caregivers should wash your hands often with soap and water for at least 20 seconds. Wash hands: Before touching the PICC or the infusion device. Before changing a  bandage (dressing). Do not change the dressing unless you have been taught to do so and have shown you are able to change it safely. Flush the PICC as told. Tell your health care provider right away if the PICC is hard to flush or does not flush. Do not use force to flush the PICC. Use clean and germ-free (sterile) supplies only. Keep the supplies in a dry place. Do not reuse needles, syringes, or any other supplies. Reusing supplies can lead to infection. Keep the PICC dressing dry and secure it with tape if the edges stop sticking to your skin. Check your PICC insertion site every day for signs of infection. Check for: Redness, swelling, or pain. Fluid or blood. Warmth. Pus or a bad smell. Preventing other problems Do not use a syringe that is less than 10 mL to flush the PICC. Do not have your blood pressure checked on the arm in which the PICC is placed. Do not ever pull or tug on the PICC. Keep it secured to your arm with tape or a stretch wrap when not in use. Do not take the PICC out yourself. Only a trained health care provider should remove the PICC. Keep pets and children away from your PICC. How to care for your PICC dressing Keep your PICC dressing clean and dry to prevent infection. Do not take baths, swim, or use a hot tub until your health care provider approves. Ask your health care provider if you can take  showers. You may only be allowed to take sponge baths. When you are allowed to shower: Ask your health care provider to teach you how to wrap the PICC. Cover the PICC with clear plastic wrap and tape to keep it dry while showering. Follow instructions from your health care provider about how to take care of your insertion site and dressing. Make sure you: Wash your hands with soap and water for at least 20 seconds before and after you change your dressing. If soap and water are not available, use hand sanitizer. Change your dressing only if taught to do so by your health care  provider. Your PICC dressing needs to be changed if it becomes loose or wet. Leave stitches (sutures), skin glue, or adhesive strips in place. These skin closures may need to stay in place for 2 weeks or longer. If adhesive strip edges start to loosen and curl up, you may trim the loose edges. Do not remove adhesive strips completely unless your health care provider tells you to do that. Follow these instructions at home: Disposal of supplies Throw away any syringes in a disposal container that is meant for sharp items (sharps container). You can buy a sharps container from a pharmacy, or you can make one by using an empty, hard plastic bottle with a lid. Place any used dressings or infusion bags into a plastic bag. Throw that bag in the trash. General instructions  Always carry your PICC identification card or wear a medical alert bracelet. Keep the tube clamped at all times, unless it is being used. Always carry a smooth-edge clamp with you to clamp the PICC if it breaks. Do not use scissors or sharp objects near the tube. You may bend your arm and move it freely. If your PICC is near or at the bend of your elbow, avoid activity with repeated motion at the elbow. Avoid lifting heavy objects as told by your health care provider. Keep all follow-up visits. This is important. You will need to have your PICC dressing changed at least once a week. Contact a health care provider if: You have pain in your arm, ear, face, or teeth. You have a fever or chills. You have redness, swelling, or pain around the insertion site. You have fluid or blood coming from the insertion site. Your insertion site feels warm to the touch. You have pus or a bad smell coming from the insertion site. Your skin feels hard and raised around the insertion site. Your PICC dressing has gotten wet or is coming off and you have not been taught how to change it. Get help right away if: You have problems with your PICC, such as  your PICC: Was tugged or pulled and has partially come out. Do not  push the PICC back in. Cannot be flushed, is hard to flush, or leaks around the insertion site when it is flushed. Makes a flushing sound when it is flushed. Appears to have a hole or tear. Is accidentally pulled all the way out. If this happens, cover the insertion site with a gauze dressing. Do not throw the PICC away. Your health care provider will need to check it to be sure the entire catheter came out. You feel your heart racing or skipping beats, or you have chest pain. You have shortness of breath or trouble breathing. You have swelling, redness, warmth, or pain in the arm in which the PICC is placed. You have a red streak going up your arm that  starts under the PICC dressing. These symptoms may be an emergency. Get help right away. Call 911. Do not wait to see if the symptoms will go away. Do not drive yourself to the hospital. Summary A peripherally inserted central catheter (PICC) is a long, thin, flexible tube (catheter) that is put into a vein in the arm or leg. If cared for properly, a PICC can remain in place for many months. Having a PICC can allow you to go home from the hospital sooner and continue treatment at home. The PICC is inserted using a germ-free (sterile) technique by a specially trained health care provider. Only a trained health care provider should remove it. Do not have your blood pressure checked on the arm in which your PICC is placed. Always keep your PICC identification card with you. This information is not intended to replace advice given to you by your health care provider. Make sure you discuss any questions you have with your health care provider. Document Revised: 12/10/2020 Document Reviewed: 12/10/2020 Elsevier Patient Education  2024 [employer].

## 2023-02-01 ENCOUNTER — Inpatient Hospital Stay: Payer: Medicare HMO

## 2023-02-08 ENCOUNTER — Inpatient Hospital Stay: Payer: Medicare HMO

## 2023-02-08 ENCOUNTER — Inpatient Hospital Stay (HOSPITAL_BASED_OUTPATIENT_CLINIC_OR_DEPARTMENT_OTHER): Payer: Medicare HMO | Admitting: Family

## 2023-02-08 ENCOUNTER — Other Ambulatory Visit: Payer: Self-pay

## 2023-02-08 ENCOUNTER — Inpatient Hospital Stay: Payer: Medicare HMO | Attending: Hematology & Oncology

## 2023-02-08 ENCOUNTER — Encounter: Payer: Self-pay | Admitting: Family

## 2023-02-08 VITALS — BP 149/70 | HR 72 | Temp 98.1°F | Resp 18 | Ht 69.0 in | Wt 146.2 lb

## 2023-02-08 DIAGNOSIS — E032 Hypothyroidism due to medicaments and other exogenous substances: Secondary | ICD-10-CM

## 2023-02-08 DIAGNOSIS — E038 Other specified hypothyroidism: Secondary | ICD-10-CM

## 2023-02-08 DIAGNOSIS — C160 Malignant neoplasm of cardia: Secondary | ICD-10-CM

## 2023-02-08 DIAGNOSIS — Z5112 Encounter for antineoplastic immunotherapy: Secondary | ICD-10-CM | POA: Insufficient documentation

## 2023-02-08 DIAGNOSIS — Z79620 Long term (current) use of immunosuppressive biologic: Secondary | ICD-10-CM | POA: Insufficient documentation

## 2023-02-08 DIAGNOSIS — C169 Malignant neoplasm of stomach, unspecified: Secondary | ICD-10-CM | POA: Diagnosis not present

## 2023-02-08 LAB — CBC WITH DIFFERENTIAL (CANCER CENTER ONLY)
Abs Immature Granulocytes: 0.01 10*3/uL (ref 0.00–0.07)
Basophils Absolute: 0.1 10*3/uL (ref 0.0–0.1)
Basophils Relative: 2 %
Eosinophils Absolute: 0.3 10*3/uL (ref 0.0–0.5)
Eosinophils Relative: 6 %
HCT: 39.7 % (ref 39.0–52.0)
Hemoglobin: 12.7 g/dL — ABNORMAL LOW (ref 13.0–17.0)
Immature Granulocytes: 0 %
Lymphocytes Relative: 22 %
Lymphs Abs: 1 10*3/uL (ref 0.7–4.0)
MCH: 28.9 pg (ref 26.0–34.0)
MCHC: 32 g/dL (ref 30.0–36.0)
MCV: 90.2 fL (ref 80.0–100.0)
Monocytes Absolute: 0.6 10*3/uL (ref 0.1–1.0)
Monocytes Relative: 12 %
Neutro Abs: 2.7 10*3/uL (ref 1.7–7.7)
Neutrophils Relative %: 58 %
Platelet Count: 287 10*3/uL (ref 150–400)
RBC: 4.4 MIL/uL (ref 4.22–5.81)
RDW: 13.2 % (ref 11.5–15.5)
WBC Count: 4.7 10*3/uL (ref 4.0–10.5)
nRBC: 0 % (ref 0.0–0.2)

## 2023-02-08 LAB — TSH: TSH: 3.434 u[IU]/mL (ref 0.350–4.500)

## 2023-02-08 LAB — CMP (CANCER CENTER ONLY)
ALT: 12 U/L (ref 0–44)
AST: 14 U/L — ABNORMAL LOW (ref 15–41)
Albumin: 3.9 g/dL (ref 3.5–5.0)
Alkaline Phosphatase: 63 U/L (ref 38–126)
Anion gap: 6 (ref 5–15)
BUN: 18 mg/dL (ref 8–23)
CO2: 29 mmol/L (ref 22–32)
Calcium: 9.3 mg/dL (ref 8.9–10.3)
Chloride: 103 mmol/L (ref 98–111)
Creatinine: 0.99 mg/dL (ref 0.61–1.24)
GFR, Estimated: >60 mL/min (ref >60)
Glucose, Bld: 103 mg/dL — ABNORMAL HIGH (ref 70–99)
Potassium: 4.1 mmol/L (ref 3.5–5.1)
Sodium: 138 mmol/L (ref 135–145)
Total Bilirubin: 0.4 mg/dL (ref 0.3–1.2)
Total Protein: 6.3 g/dL — ABNORMAL LOW (ref 6.5–8.1)

## 2023-02-08 LAB — LACTATE DEHYDROGENASE: LDH: 123 U/L (ref 98–192)

## 2023-02-08 MED ORDER — SODIUM CHLORIDE 0.9 % IV SOLN: Freq: Once | INTRAVENOUS | Status: AC

## 2023-02-08 MED ORDER — HYDROMORPHONE HCL 1 MG/ML IJ SOLN
1.0000 mg | Freq: Once | INTRAMUSCULAR | Status: AC
  Administered 2023-02-08: 1 mg via INTRAVENOUS
  Filled 2023-02-08: qty 1

## 2023-02-08 MED ORDER — SODIUM CHLORIDE 0.9 % IV SOLN
240.0000 mg | INTRAVENOUS | Status: DC
  Administered 2023-02-08: 240 mg via INTRAVENOUS
  Filled 2023-02-08: qty 24

## 2023-02-08 MED ORDER — HEPARIN SOD (PORK) LOCK FLUSH 100 UNIT/ML IV SOLN
250.0000 [IU] | Freq: Once | INTRAVENOUS | Status: AC | PRN
  Administered 2023-02-08: 250 [IU]

## 2023-02-08 MED ORDER — SODIUM CHLORIDE 0.9% FLUSH
10.0000 mL | INTRAVENOUS | Status: DC | PRN
  Administered 2023-02-08: 10 mL

## 2023-02-08 NOTE — Patient Instructions (Signed)
PICC Home Care Guide A peripherally inserted central catheter (PICC) is a form of IV access that allows medicines and IV fluids to be quickly put into the blood and spread throughout the body. The PICC is a long, thin, flexible tube (catheter) that is put into a vein in a person's arm or leg. The catheter ends in a large vein just outside the heart called the superior vena cava (SVC). After the PICC is put in, a chest X-ray may be done to make sure that it is in the right place. A PICC may be placed for different reasons, such as: To give medicines and liquid nutrition. To give IV fluids and blood products. To take blood samples often. If there is trouble placing a peripheral intravenous (PIV) catheter. If cared for properly, a PICC can remain in place for many months. Having a PICC can allow you to go home from the hospital sooner and continue treatment at home. Medicines and PICC care can be managed at home by a family member, caregiver, or home health care team. What are the risks? Generally, having a PICC is safe. However, problems may occur, including: A blood clot (thrombus) forming in or at the end of the PICC. A blood clot forming in a vein (deep vein thrombosis) or traveling to the lung (pulmonary embolism). Inflammation of the vein (phlebitis) in which the PICC is placed. Infection at the insertion site or in the blood. Blood infections from central lines, like PICCs, can be serious and often require a hospital stay. PICC malposition, or PICC movement or poor placement. A break or cut in the PICC. Do not use scissors near the PICC. Nerve or tendon irritation or injury during PICC insertion. How to care for your PICC Please follow the specific guidelines provided by your health care provider. Preventing infection You and any caregivers should wash your hands often with soap and water for at least 20 seconds. Wash hands: Before touching the PICC or the infusion device. Before changing a  bandage (dressing). Do not change the dressing unless you have been taught to do so and have shown you are able to change it safely. Flush the PICC as told. Tell your health care provider right away if the PICC is hard to flush or does not flush. Do not use force to flush the PICC. Use clean and germ-free (sterile) supplies only. Keep the supplies in a dry place. Do not reuse needles, syringes, or any other supplies. Reusing supplies can lead to infection. Keep the PICC dressing dry and secure it with tape if the edges stop sticking to your skin. Check your PICC insertion site every day for signs of infection. Check for: Redness, swelling, or pain. Fluid or blood. Warmth. Pus or a bad smell. Preventing other problems Do not use a syringe that is less than 10 mL to flush the PICC. Do not have your blood pressure checked on the arm in which the PICC is placed. Do not ever pull or tug on the PICC. Keep it secured to your arm with tape or a stretch wrap when not in use. Do not take the PICC out yourself. Only a trained health care provider should remove the PICC. Keep pets and children away from your PICC. How to care for your PICC dressing Keep your PICC dressing clean and dry to prevent infection. Do not take baths, swim, or use a hot tub until your health care provider approves. Ask your health care provider if you can take  showers. You may only be allowed to take sponge baths. When you are allowed to shower: Ask your health care provider to teach you how to wrap the PICC. Cover the PICC with clear plastic wrap and tape to keep it dry while showering. Follow instructions from your health care provider about how to take care of your insertion site and dressing. Make sure you: Wash your hands with soap and water for at least 20 seconds before and after you change your dressing. If soap and water are not available, use hand sanitizer. Change your dressing only if taught to do so by your health care  provider. Your PICC dressing needs to be changed if it becomes loose or wet. Leave stitches (sutures), skin glue, or adhesive strips in place. These skin closures may need to stay in place for 2 weeks or longer. If adhesive strip edges start to loosen and curl up, you may trim the loose edges. Do not remove adhesive strips completely unless your health care provider tells you to do that. Follow these instructions at home: Disposal of supplies Throw away any syringes in a disposal container that is meant for sharp items (sharps container). You can buy a sharps container from a pharmacy, or you can make one by using an empty, hard plastic bottle with a lid. Place any used dressings or infusion bags into a plastic bag. Throw that bag in the trash. General instructions  Always carry your PICC identification card or wear a medical alert bracelet. Keep the tube clamped at all times, unless it is being used. Always carry a smooth-edge clamp with you to clamp the PICC if it breaks. Do not use scissors or sharp objects near the tube. You may bend your arm and move it freely. If your PICC is near or at the bend of your elbow, avoid activity with repeated motion at the elbow. Avoid lifting heavy objects as told by your health care provider. Keep all follow-up visits. This is important. You will need to have your PICC dressing changed at least once a week. Contact a health care provider if: You have pain in your arm, ear, face, or teeth. You have a fever or chills. You have redness, swelling, or pain around the insertion site. You have fluid or blood coming from the insertion site. Your insertion site feels warm to the touch. You have pus or a bad smell coming from the insertion site. Your skin feels hard and raised around the insertion site. Your PICC dressing has gotten wet or is coming off and you have not been taught how to change it. Get help right away if: You have problems with your PICC, such as  your PICC: Was tugged or pulled and has partially come out. Do not  push the PICC back in. Cannot be flushed, is hard to flush, or leaks around the insertion site when it is flushed. Makes a flushing sound when it is flushed. Appears to have a hole or tear. Is accidentally pulled all the way out. If this happens, cover the insertion site with a gauze dressing. Do not throw the PICC away. Your health care provider will need to check it to be sure the entire catheter came out. You feel your heart racing or skipping beats, or you have chest pain. You have shortness of breath or trouble breathing. You have swelling, redness, warmth, or pain in the arm in which the PICC is placed. You have a red streak going up your arm that  starts under the PICC dressing. These symptoms may be an emergency. Get help right away. Call 911. Do not wait to see if the symptoms will go away. Do not drive yourself to the hospital. Summary A peripherally inserted central catheter (PICC) is a long, thin, flexible tube (catheter) that is put into a vein in the arm or leg. If cared for properly, a PICC can remain in place for many months. Having a PICC can allow you to go home from the hospital sooner and continue treatment at home. The PICC is inserted using a germ-free (sterile) technique by a specially trained health care provider. Only a trained health care provider should remove it. Do not have your blood pressure checked on the arm in which your PICC is placed. Always keep your PICC identification card with you. This information is not intended to replace advice given to you by your health care provider. Make sure you discuss any questions you have with your health care provider. Document Revised: 12/10/2020 Document Reviewed: 12/10/2020 Elsevier Patient Education  2024 [employer].

## 2023-02-08 NOTE — Patient Instructions (Signed)
[facility]  Discharge Instructions: Thank you for choosing [facility] to provide your oncology and hematology care.   If you have a lab appointment with the Cancer Center, please go directly to the Cancer Center and check in at the registration area.  Wear comfortable clothing and clothing appropriate for easy access to any Portacath or PICC line.   We strive to give you quality time with your provider. You may need to reschedule your appointment if you arrive late (15 or more minutes).  Arriving late affects you and other patients whose appointments are after yours.  Also, if you miss three or more appointments without notifying the office, you may be dismissed from the clinic at the provider's discretion.      For prescription refill requests, have your pharmacy contact our office and allow 72 hours for refills to be completed.    Today you received the following chemotherapy and/or immunotherapy agents Opdivo.      To help prevent nausea and vomiting after your treatment, we encourage you to take your nausea medication as directed.  BELOW ARE SYMPTOMS THAT SHOULD BE REPORTED IMMEDIATELY: *FEVER GREATER THAN 100.4 F (38 C) OR HIGHER *CHILLS OR SWEATING *NAUSEA AND VOMITING THAT IS NOT CONTROLLED WITH YOUR NAUSEA MEDICATION *UNUSUAL SHORTNESS OF BREATH *UNUSUAL BRUISING OR BLEEDING *URINARY PROBLEMS (pain or burning when urinating, or frequent urination) *BOWEL PROBLEMS (unusual diarrhea, constipation, pain near the anus) TENDERNESS IN MOUTH AND THROAT WITH OR WITHOUT PRESENCE OF ULCERS (sore throat, sores in mouth, or a toothache) UNUSUAL RASH, SWELLING OR PAIN  UNUSUAL VAGINAL DISCHARGE OR ITCHING   Items with * indicate a potential emergency and should be followed up as soon as possible or go to the Emergency Department if any problems should occur.  Please show the CHEMOTHERAPY ALERT CARD or IMMUNOTHERAPY ALERT CARD at check-in  to the Emergency Department and triage nurse. Should you have questions after your visit or need to cancel or reschedule your appointment, please contact [facility]  [phone] and follow the prompts.  Office hours are 8:00 a.m. to 4:30 p.m. Monday - Friday. Please note that voicemails left after 4:00 p.m. may not be returned until the following business day.  We are closed weekends and major holidays. You have access to a nurse at all times for urgent questions. Please call the main number to the clinic [phone] and follow the prompts.  For any non-urgent questions, you may also contact your provider using MyChart. We now offer e-Visits for anyone 18 and older to request care online for non-urgent symptoms. For details visit [email or web address].   Also download the MyChart app! Go to the app store, search "MyChart", open the app, select [facility], and log in with your MyChart username and password.   

## 2023-02-08 NOTE — Progress Notes (Signed)
Hematology and Oncology Follow Up Visit  George Dyer [id number] Nov 02, 1940 82 y.o. 02/08/2023   Principle Diagnosis:  Stage IIB (T3N0M0) Gastric cancer -- declines surgery -recurrent-  HER2(-)/ BRAF (+)   Current Therapy:        FOLFOX - s/p cycle #4-  start on 09/15/2021 XRT/Xeloda -- start on 12/07/2021 --completed on 01/12/2022 FOLFIRI/Opdivo -- st/p cycle #1 on 10/19/2022 -- d/c FOLFIRI on 11/02/2022 --changed Opdivo to 21-day cycles on 12/22/2018   Interim History:  George Dyer is here today with his wife for follow-up and treatment. He still notes persistent pain in his lower extremities. Unfortunately he states that he can not tolerate lyrica or gabapentin due to neurological symptoms.  He is currently on Ultram.  No falls or syncope reported.  No numbness or tingling in his extremities. No swelling noted.  No fever, chills, n/v, cough, rash, dizziness, SOB, chest pain, palpitations, abdominal pain or changes in bowel or bladder habits.  He has not noted any blood loss. No bruising or petechiae.  Appetite and hydration are good. Weight is stable at 146 lbs.   ECOG Performance Status: 1 - Symptomatic but completely ambulatory  Medications:  Allergies as of 02/08/2023       Reactions   Percocet [oxycodone-acetaminophen] Nausea And Vomiting   Oxycodone Other (See Comments)   Oxycodone-aspirin Nausea And Vomiting        Medication List        Accurate as of February 08, 2023 10:42 AM. If you have any questions, ask your nurse or doctor.          acetaminophen 325 MG tablet Commonly known as: TYLENOL Take 650 mg by mouth 2 (two) times daily as needed.   alum & mag hydroxide-simeth 200-200-20 MG/5ML suspension Commonly known as: MAALOX/MYLANTA Take 30 mLs by mouth daily.   dexamethasone 4 MG tablet Commonly known as: DECADRON Take 2 tablets (8 mg total) by mouth daily. Start the day after chemotherapy for 2 days. Take with food.   dronabinol 2.5 MG  capsule Commonly known as: MARINOL Take 1 capsule (2.5 mg total) by mouth 2 (two) times daily before a meal.   DULoxetine 30 MG capsule Commonly known as: Cymbalta Take 2 capsules (60 mg total) by mouth at bedtime. Take 1 capsule (30 mg) for the 1st week, then increase to 2 capsules thereafter.   Heparin Na (Pork) Lock Flsh PF 100 UNIT/ML Soln Flush each PICC lumen with 2.5 mLs (250 Units total) as directed every Monday, Wednesday, and Friday.   lactulose 10 GM/15ML solution Commonly known as: CHRONULAC Take 30 mLs (20 g total) by mouth 3 (three) times daily.   levothyroxine 100 MCG tablet Commonly known as: Synthroid Take 1 tablet (100 mcg total) by mouth daily before breakfast.   loperamide 2 MG capsule Commonly known as: IMODIUM Take 2 tabs by mouth with first loose stool, then 1 tab with each additional loose stool as needed. Do not exceed 8 tabs in a 24-hour period   megestrol 40 MG/ML suspension Commonly known as: MEGACE TAKE 10 MLS (400 MG TOTAL) BY MOUTH 2 (TWO) TIMES DAILY.   megestrol 625 MG/5ML suspension Commonly known as: MEGACE ES Take 5 mLs (625 mg total) by mouth daily.   metFORMIN 500 MG tablet Commonly known as: GLUCOPHAGE Take 1 tablet (500 mg total) by mouth daily with breakfast.   Normal Saline Flush 0.9 % Soln Inject 10 mLs into each port 3 (three) times a week. Flush each port of  PICC Monday, Wednesday and Friday.   omeprazole 40 MG capsule Commonly known as: PRILOSEC TAKE 1 CAPSULE (40 MG TOTAL) BY MOUTH DAILY.   ondansetron 8 MG tablet Commonly known as: ZOFRAN Take 1 tablet (8 mg total) by mouth every 8 (eight) hours as needed for nausea, vomiting or refractory nausea / vomiting. Start on the third day after chemotherapy.   polyethylene glycol 17 g packet Commonly known as: MIRALAX / GLYCOLAX Take 17 g by mouth daily.   prochlorperazine 10 MG tablet Commonly known as: COMPAZINE Take 1 tablet (10 mg total) by mouth every 6 (six) hours as  needed for nausea or vomiting.   temazepam 7.5 MG capsule Commonly known as: RESTORIL Take 1 capsule (7.5 mg total) by mouth at bedtime as needed for sleep.   traMADol 50 MG tablet Commonly known as: ULTRAM Take by mouth.        Allergies:  Allergies  Allergen Reactions   Percocet [Oxycodone-Acetaminophen] Nausea And Vomiting   Oxycodone Other (See Comments)   Oxycodone-Aspirin Nausea And Vomiting    Past Medical History, Surgical history, Social history, and Family History were reviewed and updated.  Review of Systems: All other 10 point review of systems is negative.   Physical Exam:  vitals were not taken for this visit.   Wt Readings from Last 3 Encounters:  01/18/23 146 lb 8 oz (66.5 kg)  12/22/22 145 lb (65.8 kg)  12/07/22 145 lb (65.8 kg)    Ocular: Sclerae unicteric, pupils equal, round and reactive to light Ear-nose-throat: Oropharynx clear, dentition fair Lymphatic: No cervical or supraclavicular adenopathy Lungs no rales or rhonchi, good excursion bilaterally Heart regular rate and rhythm, no murmur appreciated Abd soft, nontender, positive bowel sounds MSK no focal spinal tenderness, no joint edema Neuro: non-focal, well-oriented, appropriate affect Breasts: Deferred   Lab Results  Component Value Date   WBC 4.7 02/08/2023   HGB 12.7 (L) 02/08/2023   HCT 39.7 02/08/2023   MCV 90.2 02/08/2023   PLT 287 02/08/2023   Lab Results  Component Value Date   FERRITIN 74 10/04/2022   IRON 93 10/04/2022   TIBC 279 10/04/2022   UIBC 186 10/04/2022   IRONPCTSAT 33 10/04/2022   Lab Results  Component Value Date   RETICCTPCT 0.7 08/18/2021   RBC 4.40 02/08/2023   No results found for: "KPAFRELGTCHN", "LAMBDASER", "KAPLAMBRATIO" Lab Results  Component Value Date   IGGSERUM 931 05/05/2022   IGMSERUM 59 05/05/2022   No results found for: "TOTALPROTELP", "ALBUMINELP", "A1GS", "A2GS", "BETS", "BETA2SER", "GAMS", "MSPIKE", "SPEI"   Chemistry       Component Value Date/Time   NA 139 01/18/2023 1000   NA 139 07/23/2020 0818   K 4.3 01/18/2023 1000   CL 104 01/18/2023 1000   CO2 30 01/18/2023 1000   BUN 18 01/18/2023 1000   BUN 10 07/23/2020 0818   CREATININE 0.99 01/18/2023 1000   CREATININE 1.06 06/27/2015 0837      Component Value Date/Time   CALCIUM 9.0 01/18/2023 1000   ALKPHOS 74 01/18/2023 1000   AST 13 (L) 01/18/2023 1000   ALT 15 01/18/2023 1000   BILITOT 0.5 01/18/2023 1000       Impression and Plan: George Dyer is a very pleasant 82 yo caucasian gentleman with localized gastric cancer. So far, he has refused surgery to treat.  He is stable on Opdivo and tolerating nicely.   We will proceed with treatment today as planned.  We will plan to repeat PET scan in  another month.  Follow-up in 3 weeks.   Eileen Stanford, NP 9/3/202410:42 AM

## 2023-02-09 ENCOUNTER — Other Ambulatory Visit: Payer: Self-pay

## 2023-02-10 ENCOUNTER — Other Ambulatory Visit: Payer: Self-pay | Admitting: Hematology & Oncology

## 2023-02-10 ENCOUNTER — Other Ambulatory Visit (HOSPITAL_BASED_OUTPATIENT_CLINIC_OR_DEPARTMENT_OTHER): Payer: Self-pay

## 2023-02-10 DIAGNOSIS — C160 Malignant neoplasm of cardia: Secondary | ICD-10-CM

## 2023-02-11 ENCOUNTER — Encounter: Payer: Self-pay | Admitting: Hematology & Oncology

## 2023-02-11 ENCOUNTER — Other Ambulatory Visit: Payer: Self-pay | Admitting: *Deleted

## 2023-02-11 MED ORDER — TEMAZEPAM 7.5 MG PO CAPS: 7.5000 mg | ORAL_CAPSULE | Freq: Every evening | ORAL | 0 refills | Status: DC | PRN

## 2023-02-11 NOTE — Telephone Encounter (Signed)
Opened in error

## 2023-02-11 NOTE — Telephone Encounter (Signed)
Last refilled 12/17/22 #30. Please advise for refills, thanks!

## 2023-02-18 ENCOUNTER — Encounter: Payer: Self-pay | Admitting: Hematology & Oncology

## 2023-02-21 ENCOUNTER — Encounter: Payer: Self-pay | Admitting: Hematology & Oncology

## 2023-02-22 NOTE — Progress Notes (Signed)
Opdivo changed to 360 mg q21d per Dr. Gustavo Lah instructions.

## 2023-02-28 ENCOUNTER — Other Ambulatory Visit: Payer: Self-pay

## 2023-02-28 DIAGNOSIS — C160 Malignant neoplasm of cardia: Secondary | ICD-10-CM

## 2023-03-01 ENCOUNTER — Inpatient Hospital Stay: Payer: Medicare HMO

## 2023-03-01 ENCOUNTER — Encounter: Payer: Self-pay | Admitting: Hematology & Oncology

## 2023-03-01 ENCOUNTER — Inpatient Hospital Stay (HOSPITAL_BASED_OUTPATIENT_CLINIC_OR_DEPARTMENT_OTHER): Payer: Medicare HMO | Admitting: Hematology & Oncology

## 2023-03-01 DIAGNOSIS — C160 Malignant neoplasm of cardia: Secondary | ICD-10-CM | POA: Diagnosis not present

## 2023-03-01 DIAGNOSIS — Z5112 Encounter for antineoplastic immunotherapy: Secondary | ICD-10-CM | POA: Diagnosis not present

## 2023-03-01 DIAGNOSIS — C169 Malignant neoplasm of stomach, unspecified: Secondary | ICD-10-CM | POA: Diagnosis not present

## 2023-03-01 DIAGNOSIS — Z79620 Long term (current) use of immunosuppressive biologic: Secondary | ICD-10-CM | POA: Diagnosis not present

## 2023-03-01 LAB — CBC WITH DIFFERENTIAL (CANCER CENTER ONLY)
Abs Immature Granulocytes: 0.01 10*3/uL (ref 0.00–0.07)
Basophils Absolute: 0.1 10*3/uL (ref 0.0–0.1)
Basophils Relative: 2 %
Eosinophils Absolute: 0.4 10*3/uL (ref 0.0–0.5)
Eosinophils Relative: 7 %
HCT: 38.3 % — ABNORMAL LOW (ref 39.0–52.0)
Hemoglobin: 12.4 g/dL — ABNORMAL LOW (ref 13.0–17.0)
Immature Granulocytes: 0 %
Lymphocytes Relative: 18 %
Lymphs Abs: 1 10*3/uL (ref 0.7–4.0)
MCH: 29 pg (ref 26.0–34.0)
MCHC: 32.4 g/dL (ref 30.0–36.0)
MCV: 89.5 fL (ref 80.0–100.0)
Monocytes Absolute: 0.6 10*3/uL (ref 0.1–1.0)
Monocytes Relative: 12 %
Neutro Abs: 3.4 10*3/uL (ref 1.7–7.7)
Neutrophils Relative %: 61 %
Platelet Count: 270 10*3/uL (ref 150–400)
RBC: 4.28 MIL/uL (ref 4.22–5.81)
RDW: 13.6 % (ref 11.5–15.5)
WBC Count: 5.5 10*3/uL (ref 4.0–10.5)
nRBC: 0 % (ref 0.0–0.2)

## 2023-03-01 LAB — CMP (CANCER CENTER ONLY)
ALT: 12 U/L (ref 0–44)
AST: 13 U/L — ABNORMAL LOW (ref 15–41)
Albumin: 3.8 g/dL (ref 3.5–5.0)
Alkaline Phosphatase: 68 U/L (ref 38–126)
Anion gap: 6 (ref 5–15)
BUN: 18 mg/dL (ref 8–23)
CO2: 29 mmol/L (ref 22–32)
Calcium: 8.8 mg/dL — ABNORMAL LOW (ref 8.9–10.3)
Chloride: 103 mmol/L (ref 98–111)
Creatinine: 1 mg/dL (ref 0.61–1.24)
GFR, Estimated: >60 mL/min (ref >60)
Glucose, Bld: 91 mg/dL (ref 70–99)
Potassium: 4.2 mmol/L (ref 3.5–5.1)
Sodium: 138 mmol/L (ref 135–145)
Total Bilirubin: 0.5 mg/dL (ref 0.3–1.2)
Total Protein: 6.1 g/dL — ABNORMAL LOW (ref 6.5–8.1)

## 2023-03-01 LAB — LACTATE DEHYDROGENASE: LDH: 115 U/L (ref 98–192)

## 2023-03-01 LAB — TSH: TSH: 2.152 u[IU]/mL (ref 0.350–4.500)

## 2023-03-01 MED ORDER — SODIUM CHLORIDE 0.9 % IV SOLN
240.0000 mg | INTRAVENOUS | Status: DC
  Administered 2023-03-01: 240 mg via INTRAVENOUS
  Filled 2023-03-01: qty 24

## 2023-03-01 MED ORDER — SODIUM CHLORIDE 0.9% FLUSH
10.0000 mL | INTRAVENOUS | Status: DC | PRN
  Administered 2023-03-01: 10 mL via INTRAVENOUS

## 2023-03-01 MED ORDER — POLYETHYLENE GLYCOL 3350 17 GM/SCOOP PO POWD: 17.0000 g | Freq: Two times a day (BID) | ORAL | 6 refills | Status: AC

## 2023-03-01 MED ORDER — HYDROMORPHONE HCL 1 MG/ML IJ SOLN
1.0000 mg | Freq: Once | INTRAMUSCULAR | Status: AC
  Administered 2023-03-01: 1 mg via INTRAVENOUS
  Filled 2023-03-01: qty 1

## 2023-03-01 MED ORDER — HEPARIN SOD (PORK) LOCK FLUSH 100 UNIT/ML IV SOLN
500.0000 [IU] | Freq: Once | INTRAVENOUS | Status: AC
  Administered 2023-03-01: 500 [IU] via INTRAVENOUS

## 2023-03-01 MED ORDER — SODIUM CHLORIDE 0.9 % IV SOLN: Freq: Once | INTRAVENOUS | Status: AC

## 2023-03-01 NOTE — Progress Notes (Signed)
Patient receives patient assistance nivolumab. Will leave at 240 mg dose today and applying for 360 mg dose with manufacturer. Orders changed per Dr. Gustavo Lah instructions.

## 2023-03-01 NOTE — Patient Instructions (Signed)
Nivolumab Injection What is this medication? NIVOLUMAB (nye VOL ue mab) treats some types of cancer. It works by helping your immune system slow or stop the spread of cancer cells. It is a monoclonal antibody. This medicine may be used for other purposes; ask your health care provider or pharmacist if you have questions. COMMON BRAND NAME(S): Opdivo What should I tell my care team before I take this medication? They need to know if you have any of these conditions: Allogeneic stem cell transplant (uses someone else's stem cells) Autoimmune diseases, such as Crohn disease, ulcerative colitis, lupus History of chest radiation Nervous system problems, such as Guillain-Barre syndrome or myasthenia gravis Organ transplant An unusual or allergic reaction to nivolumab, other medications, foods, dyes, or preservatives Pregnant or trying to get pregnant Breast-feeding How should I use this medication? This medication is infused into a vein. It is given in a hospital or clinic setting. A special MedGuide will be given to you before each treatment. Be sure to read this information carefully each time. Talk to your care team about the use of this medication in children. While it may be prescribed for children as young as 12 years for selected conditions, precautions do apply. Overdosage: If you think you have taken too much of this medicine contact a poison control center or emergency room at once. NOTE: This medicine is only for you. Do not share this medicine with others. What if I miss a dose? Keep appointments for follow-up doses. It is important not to miss your dose. Call your care team if you are unable to keep an appointment. What may interact with this medication? Interactions have not been studied. This list may not describe all possible interactions. Give your health care provider a list of all the medicines, herbs, non-prescription drugs, or dietary supplements you use. Also tell them if you  smoke, drink alcohol, or use illegal drugs. Some items may interact with your medicine. What should I watch for while using this medication? Your condition will be monitored carefully while you are receiving this medication. You may need blood work while taking this medication. This medication may cause serious skin reactions. They can happen weeks to months after starting the medication. Contact your care team right away if you notice fevers or flu-like symptoms with a rash. The rash may be red or purple and then turn into blisters or peeling of the skin. You may also notice a red rash with swelling of the face, lips, or lymph nodes in your neck or under your arms. Tell your care team right away if you have any change in your eyesight. Talk to your care team if you are pregnant or think you might be pregnant. A negative pregnancy test is required before starting this medication. A reliable form of contraception is recommended while taking this medication and for 5 months after the last dose. Talk to your care team about effective forms of contraception. Do not breast-feed while taking this medication and for 5 months after the last dose. What side effects may I notice from receiving this medication? Side effects that you should report to your care team as soon as possible: Allergic reactions--skin rash, itching, hives, swelling of the face, lips, tongue, or throat Dry cough, shortness of breath or trouble breathing Eye pain, redness, irritation, or discharge with blurry or decreased vision Heart muscle inflammation--unusual weakness or fatigue, shortness of breath, chest pain, fast or irregular heartbeat, dizziness, swelling of the ankles, feet, or hands Hormone  gland problems--headache, sensitivity to light, unusual weakness or fatigue, dizziness, fast or irregular heartbeat, increased sensitivity to cold or heat, excessive sweating, constipation, hair loss, increased thirst or amount of urine,  tremors or shaking, irritability Infusion reactions--chest pain, shortness of breath or trouble breathing, feeling faint or lightheaded Kidney injury (glomerulonephritis)--decrease in the amount of urine, red or dark brown urine, foamy or bubbly urine, swelling of the ankles, hands, or feet Liver injury--right upper belly pain, loss of appetite, nausea, light-colored stool, dark yellow or brown urine, yellowing skin or eyes, unusual weakness or fatigue Pain, tingling, or numbness in the hands or feet, muscle weakness, change in vision, confusion or trouble speaking, loss of balance or coordination, trouble walking, seizures Rash, fever, and swollen lymph nodes Redness, blistering, peeling, or loosening of the skin, including inside the mouth Sudden or severe stomach pain, bloody diarrhea, fever, nausea, vomiting Side effects that usually do not require medical attention (report these to your care team if they continue or are bothersome): Bone, joint, or muscle pain Diarrhea Fatigue Loss of appetite Nausea Skin rash This list may not describe all possible side effects. Call your doctor for medical advice about side effects. You may report side effects to FDA at 1-800-FDA-1088. Where should I keep my medication? This medication is given in a hospital or clinic. It will not be stored at home. NOTE: This sheet is a summary. It may not cover all possible information. If you have questions about this medicine, talk to your doctor, pharmacist, or health care provider.  2024 Elsevier/Gold Standard (2021-09-21 00:00:00)

## 2023-03-01 NOTE — Progress Notes (Signed)
Hematology and Oncology Follow Up Visit  George Dyer [id number] 16-May-1941 82 y.o. 03/01/2023   Principle Diagnosis:  Stage IIB (T3N0M0) Gastric cancer -- declines surgery -recurrent-  HER2(-)/ BRAF (+)   Current Therapy:        FOLFOX - s/p cycle #4-  start on 09/15/2021 XRT/Xeloda -- start on 12/07/2021 --completed on 01/12/2022 FOLFIRI/Opdivo -- st/p cycle #1 on 10/19/2022 -- d/c FOLFIRI on 11/02/2022 --changed Opdivo to 21-day cycles on 12/22/2018   Interim History:  George Dyer is here today with his wife for follow-up and treatment.  He seems to be doing fairly well.  However, the problem that he is now having is he is having some problems with dysphagia.  He says that he has a follow-up eating with liquid.  If not, food will get stuck and he throws up.  I hope that this is not reflective of his gastric malignancy.  He otherwise seems to be looking pretty good.  He does have the neuropathy which is chronic for him.  He does have some pain issues.  He is on tramadol to try to help with his chronic pain.  He has had no cough.  He has had no bleeding.  He has had no obvious change in bowel or bladder habits.  Overall, I would have GIST has performed status is probably ECOG 1.    Medications:  Allergies as of 03/01/2023       Reactions   Percocet [oxycodone-acetaminophen] Nausea And Vomiting   Oxycodone Other (See Comments)   Doesn't remember   Oxycodone-aspirin Nausea And Vomiting        Medication List        Accurate as of March 01, 2023 11:06 AM. If you have any questions, ask your nurse or doctor.          acetaminophen 325 MG tablet Commonly known as: TYLENOL Take 650 mg by mouth 2 (two) times daily as needed.   alum & mag hydroxide-simeth 200-200-20 MG/5ML suspension Commonly known as: MAALOX/MYLANTA Take 30 mLs by mouth daily.   dexamethasone 4 MG tablet Commonly known as: DECADRON Take 2 tablets (8 mg total) by mouth daily. Start the day  after chemotherapy for 2 days. Take with food.   doxylamine (Sleep) 25 MG tablet Commonly known as: UNISOM Take 25 mg by mouth at bedtime as needed.   dronabinol 2.5 MG capsule Commonly known as: MARINOL Take 1 capsule (2.5 mg total) by mouth 2 (two) times daily before a meal.   DULoxetine 30 MG capsule Commonly known as: Cymbalta Take 2 capsules (60 mg total) by mouth at bedtime. Take 1 capsule (30 mg) for the 1st week, then increase to 2 capsules thereafter.   Heparin Na (Pork) Lock Flsh PF 100 UNIT/ML Soln Flush each PICC lumen with 2.5 mLs (250 Units total) as directed every Monday, Wednesday, and Friday.   lactulose 10 GM/15ML solution Commonly known as: CHRONULAC Take 30 mLs (20 g total) by mouth 3 (three) times daily.   levothyroxine 75 MCG tablet Commonly known as: SYNTHROID Take 75 mcg by mouth daily.   loperamide 2 MG capsule Commonly known as: IMODIUM Take 2 tabs by mouth with first loose stool, then 1 tab with each additional loose stool as needed. Do not exceed 8 tabs in a 24-hour period   megestrol 40 MG/ML suspension Commonly known as: MEGACE TAKE 10 MLS (400 MG TOTAL) BY MOUTH 2 (TWO) TIMES DAILY.   megestrol 625 MG/5ML suspension Commonly known as: MEGACE  ES Take 5 mLs (625 mg total) by mouth daily.   metFORMIN 500 MG tablet Commonly known as: GLUCOPHAGE Take 1 tablet (500 mg total) by mouth daily with breakfast.   Normal Saline Flush 0.9 % Soln Inject 10 mLs into each port 3 (three) times a week. Flush each port of PICC Monday, Wednesday and Friday.   omeprazole 40 MG capsule Commonly known as: PRILOSEC TAKE 1 CAPSULE (40 MG TOTAL) BY MOUTH DAILY.   ondansetron 8 MG tablet Commonly known as: ZOFRAN Take 1 tablet (8 mg total) by mouth every 8 (eight) hours as needed for nausea, vomiting or refractory nausea / vomiting. Start on the third day after chemotherapy.   polyethylene glycol 17 g packet Commonly known as: MIRALAX / GLYCOLAX Take 17 g by  mouth daily.   prochlorperazine 10 MG tablet Commonly known as: COMPAZINE Take 1 tablet (10 mg total) by mouth every 6 (six) hours as needed for nausea or vomiting.   temazepam 7.5 MG capsule Commonly known as: RESTORIL Take 1 capsule (7.5 mg total) by mouth at bedtime as needed for sleep.   traMADol 50 MG tablet Commonly known as: ULTRAM Take by mouth.        Allergies:  Allergies  Allergen Reactions   Percocet [Oxycodone-Acetaminophen] Nausea And Vomiting   Oxycodone Other (See Comments)    Doesn't remember   Oxycodone-Aspirin Nausea And Vomiting    Past Medical History, Surgical history, Social history, and Family History were reviewed and updated.  Review of Systems: Review of Systems  Constitutional: Negative.   HENT: Negative.    Eyes: Negative.   Respiratory: Negative.    Cardiovascular: Negative.   Gastrointestinal:  Positive for nausea and vomiting.  Genitourinary: Negative.   Musculoskeletal: Negative.   Skin: Negative.   Neurological: Negative.   Endo/Heme/Allergies: Negative.   Psychiatric/Behavioral: Negative.     Marland Kitchen   Physical Exam:  weight is 148 lb (67.1 kg). His oral temperature is 98 F (36.7 C). His blood pressure is 134/76 and his pulse is 77. His respiration is 19 and oxygen saturation is 98%.   Wt Readings from Last 3 Encounters:  03/01/23 148 lb (67.1 kg)  02/08/23 146 lb 3.2 oz (66.3 kg)  01/18/23 146 lb 8 oz (66.5 kg)    Physical Exam Vitals reviewed.  HENT:     Head: Normocephalic and atraumatic.  Eyes:     Pupils: Pupils are equal, round, and reactive to light.  Cardiovascular:     Rate and Rhythm: Normal rate and regular rhythm.     Heart sounds: Normal heart sounds.  Pulmonary:     Effort: Pulmonary effort is normal.     Breath sounds: Normal breath sounds.  Abdominal:     General: Bowel sounds are normal.     Palpations: Abdomen is soft.  Musculoskeletal:        General: No tenderness or deformity. Normal range of  motion.     Cervical back: Normal range of motion.  Lymphadenopathy:     Cervical: No cervical adenopathy.  Skin:    General: Skin is warm and dry.     Findings: No erythema or rash.  Neurological:     Mental Status: He is alert and oriented to person, place, and time.  Psychiatric:        Behavior: Behavior normal.        Thought Content: Thought content normal.        Judgment: Judgment normal.      Lab  Results  Component Value Date   WBC 5.5 03/01/2023   HGB 12.4 (L) 03/01/2023   HCT 38.3 (L) 03/01/2023   MCV 89.5 03/01/2023   PLT 270 03/01/2023   Lab Results  Component Value Date   FERRITIN 74 10/04/2022   IRON 93 10/04/2022   TIBC 279 10/04/2022   UIBC 186 10/04/2022   IRONPCTSAT 33 10/04/2022   Lab Results  Component Value Date   RETICCTPCT 0.7 08/18/2021   RBC 4.28 03/01/2023   No results found for: "KPAFRELGTCHN", "LAMBDASER", "KAPLAMBRATIO" Lab Results  Component Value Date   IGGSERUM 931 05/05/2022   IGMSERUM 59 05/05/2022   No results found for: "TOTALPROTELP", "ALBUMINELP", "A1GS", "A2GS", "BETS", "BETA2SER", "GAMS", "MSPIKE", "SPEI"   Chemistry      Component Value Date/Time   NA 138 03/01/2023 0950   NA 139 07/23/2020 0818   K 4.2 03/01/2023 0950   CL 103 03/01/2023 0950   CO2 29 03/01/2023 0950   BUN 18 03/01/2023 0950   BUN 10 07/23/2020 0818   CREATININE 1.00 03/01/2023 0950   CREATININE 1.06 06/27/2015 0837      Component Value Date/Time   CALCIUM 8.8 (L) 03/01/2023 0950   ALKPHOS 68 03/01/2023 0950   AST 13 (L) 03/01/2023 0950   ALT 12 03/01/2023 0950   BILITOT 0.5 03/01/2023 0950       Impression and Plan: Mr. Bretl is a very pleasant 82 yo caucasian gentleman with localized gastric cancer. So far, he has been very reluctant to undergo surgery for treatment of this localized disease.  Will go to have to get a PET scan on him.  I think his last PET scan was back in July.  We may have to also consider an upper endoscopy on  him to see about this dysphagia.  Again I worry that this might be reflective of his tumor in the stomach.  I just want his quality life to be good.  I know he is trying hard.  He and his wife are a really good team.  She really is helping him out.  She is incredibly proactive with him.  Hopefully, his neuropathy will stabilize.  I know he has had a horrible time with neuropathy.  This is been chronic.  He has had this even prior to all of his treatments.  We will plan for follow-up in 3 weeks.  Will try to get the PET scan in 2 weeks.  Marland Kitchen   Josph Macho, MD 9/24/202411:06 AM

## 2023-03-01 NOTE — Patient Instructions (Signed)
PICC Home Care Guide A peripherally inserted central catheter (PICC) is a form of IV access that allows medicines and IV fluids to be quickly put into the blood and spread throughout the body. The PICC is a long, thin, flexible tube (catheter) that is put into a vein in a person's arm or leg. The catheter ends in a large vein just outside the heart called the superior vena cava (SVC). After the PICC is put in, a chest X-ray may be done to make sure that it is in the right place. A PICC may be placed for different reasons, such as: To give medicines and liquid nutrition. To give IV fluids and blood products. To take blood samples often. If there is trouble placing a peripheral intravenous (PIV) catheter. If cared for properly, a PICC can remain in place for many months. Having a PICC can allow you to go home from the hospital sooner and continue treatment at home. Medicines and PICC care can be managed at home by a family member, caregiver, or home health care team. What are the risks? Generally, having a PICC is safe. However, problems may occur, including: A blood clot (thrombus) forming in or at the end of the PICC. A blood clot forming in a vein (deep vein thrombosis) or traveling to the lung (pulmonary embolism). Inflammation of the vein (phlebitis) in which the PICC is placed. Infection at the insertion site or in the blood. Blood infections from central lines, like PICCs, can be serious and often require a hospital stay. PICC malposition, or PICC movement or poor placement. A break or cut in the PICC. Do not use scissors near the PICC. Nerve or tendon irritation or injury during PICC insertion. How to care for your PICC Please follow the specific guidelines provided by your health care provider. Preventing infection You and any caregivers should wash your hands often with soap and water for at least 20 seconds. Wash hands: Before touching the PICC or the infusion device. Before changing a  bandage (dressing). Do not change the dressing unless you have been taught to do so and have shown you are able to change it safely. Flush the PICC as told. Tell your health care provider right away if the PICC is hard to flush or does not flush. Do not use force to flush the PICC. Use clean and germ-free (sterile) supplies only. Keep the supplies in a dry place. Do not reuse needles, syringes, or any other supplies. Reusing supplies can lead to infection. Keep the PICC dressing dry and secure it with tape if the edges stop sticking to your skin. Check your PICC insertion site every day for signs of infection. Check for: Redness, swelling, or pain. Fluid or blood. Warmth. Pus or a bad smell. Preventing other problems Do not use a syringe that is less than 10 mL to flush the PICC. Do not have your blood pressure checked on the arm in which the PICC is placed. Do not ever pull or tug on the PICC. Keep it secured to your arm with tape or a stretch wrap when not in use. Do not take the PICC out yourself. Only a trained health care provider should remove the PICC. Keep pets and children away from your PICC. How to care for your PICC dressing Keep your PICC dressing clean and dry to prevent infection. Do not take baths, swim, or use a hot tub until your health care provider approves. Ask your health care provider if you can take  showers. You may only be allowed to take sponge baths. When you are allowed to shower: Ask your health care provider to teach you how to wrap the PICC. Cover the PICC with clear plastic wrap and tape to keep it dry while showering. Follow instructions from your health care provider about how to take care of your insertion site and dressing. Make sure you: Wash your hands with soap and water for at least 20 seconds before and after you change your dressing. If soap and water are not available, use hand sanitizer. Change your dressing only if taught to do so by your health care  provider. Your PICC dressing needs to be changed if it becomes loose or wet. Leave stitches (sutures), skin glue, or adhesive strips in place. These skin closures may need to stay in place for 2 weeks or longer. If adhesive strip edges start to loosen and curl up, you may trim the loose edges. Do not remove adhesive strips completely unless your health care provider tells you to do that. Follow these instructions at home: Disposal of supplies Throw away any syringes in a disposal container that is meant for sharp items (sharps container). You can buy a sharps container from a pharmacy, or you can make one by using an empty, hard plastic bottle with a lid. Place any used dressings or infusion bags into a plastic bag. Throw that bag in the trash. General instructions  Always carry your PICC identification card or wear a medical alert bracelet. Keep the tube clamped at all times, unless it is being used. Always carry a smooth-edge clamp with you to clamp the PICC if it breaks. Do not use scissors or sharp objects near the tube. You may bend your arm and move it freely. If your PICC is near or at the bend of your elbow, avoid activity with repeated motion at the elbow. Avoid lifting heavy objects as told by your health care provider. Keep all follow-up visits. This is important. You will need to have your PICC dressing changed at least once a week. Contact a health care provider if: You have pain in your arm, ear, face, or teeth. You have a fever or chills. You have redness, swelling, or pain around the insertion site. You have fluid or blood coming from the insertion site. Your insertion site feels warm to the touch. You have pus or a bad smell coming from the insertion site. Your skin feels hard and raised around the insertion site. Your PICC dressing has gotten wet or is coming off and you have not been taught how to change it. Get help right away if: You have problems with your PICC, such as  your PICC: Was tugged or pulled and has partially come out. Do not  push the PICC back in. Cannot be flushed, is hard to flush, or leaks around the insertion site when it is flushed. Makes a flushing sound when it is flushed. Appears to have a hole or tear. Is accidentally pulled all the way out. If this happens, cover the insertion site with a gauze dressing. Do not throw the PICC away. Your health care provider will need to check it to be sure the entire catheter came out. You feel your heart racing or skipping beats, or you have chest pain. You have shortness of breath or trouble breathing. You have swelling, redness, warmth, or pain in the arm in which the PICC is placed. You have a red streak going up your arm that  starts under the PICC dressing. These symptoms may be an emergency. Get help right away. Call 911. Do not wait to see if the symptoms will go away. Do not drive yourself to the hospital. Summary A peripherally inserted central catheter (PICC) is a long, thin, flexible tube (catheter) that is put into a vein in the arm or leg. If cared for properly, a PICC can remain in place for many months. Having a PICC can allow you to go home from the hospital sooner and continue treatment at home. The PICC is inserted using a germ-free (sterile) technique by a specially trained health care provider. Only a trained health care provider should remove it. Do not have your blood pressure checked on the arm in which your PICC is placed. Always keep your PICC identification card with you. This information is not intended to replace advice given to you by your health care provider. Make sure you discuss any questions you have with your health care provider. Document Revised: 12/10/2020 Document Reviewed: 12/10/2020 Elsevier Patient Education  2024 [employer].

## 2023-03-03 ENCOUNTER — Other Ambulatory Visit: Payer: Self-pay

## 2023-03-04 ENCOUNTER — Other Ambulatory Visit: Payer: Self-pay

## 2023-03-08 ENCOUNTER — Inpatient Hospital Stay: Payer: Medicare HMO | Attending: Hematology & Oncology

## 2023-03-08 ENCOUNTER — Other Ambulatory Visit: Payer: Self-pay | Admitting: Hematology & Oncology

## 2023-03-08 VITALS — BP 130/64 | HR 74 | Temp 98.5°F | Resp 19 | Ht 69.0 in | Wt 148.5 lb

## 2023-03-08 DIAGNOSIS — Z5112 Encounter for antineoplastic immunotherapy: Secondary | ICD-10-CM | POA: Diagnosis not present

## 2023-03-08 DIAGNOSIS — E038 Other specified hypothyroidism: Secondary | ICD-10-CM

## 2023-03-08 DIAGNOSIS — C169 Malignant neoplasm of stomach, unspecified: Secondary | ICD-10-CM | POA: Diagnosis not present

## 2023-03-08 DIAGNOSIS — Z79620 Long term (current) use of immunosuppressive biologic: Secondary | ICD-10-CM | POA: Diagnosis not present

## 2023-03-08 DIAGNOSIS — Z452 Encounter for adjustment and management of vascular access device: Secondary | ICD-10-CM | POA: Diagnosis not present

## 2023-03-08 MED ORDER — HEPARIN SOD (PORK) LOCK FLUSH 100 UNIT/ML IV SOLN
500.0000 [IU] | Freq: Once | INTRAVENOUS | Status: AC
  Administered 2023-03-08: 500 [IU] via INTRAVENOUS

## 2023-03-08 MED ORDER — SODIUM CHLORIDE 0.9% FLUSH
10.0000 mL | Freq: Once | INTRAVENOUS | Status: AC
  Administered 2023-03-08: 10 mL via INTRAVENOUS

## 2023-03-10 ENCOUNTER — Encounter (HOSPITAL_COMMUNITY)
Admission: RE | Admit: 2023-03-10 | Discharge: 2023-03-10 | Disposition: A | Discharge status: Home / Self Care | Payer: Medicare HMO | Source: Ambulatory Visit | Attending: Hematology & Oncology | Admitting: Hematology & Oncology

## 2023-03-10 ENCOUNTER — Other Ambulatory Visit (HOSPITAL_BASED_OUTPATIENT_CLINIC_OR_DEPARTMENT_OTHER): Payer: Self-pay

## 2023-03-10 ENCOUNTER — Other Ambulatory Visit: Payer: Self-pay | Admitting: Hematology & Oncology

## 2023-03-10 DIAGNOSIS — C160 Malignant neoplasm of cardia: Secondary | ICD-10-CM | POA: Insufficient documentation

## 2023-03-10 DIAGNOSIS — C169 Malignant neoplasm of stomach, unspecified: Secondary | ICD-10-CM | POA: Diagnosis not present

## 2023-03-10 LAB — GLUCOSE, CAPILLARY: Glucose-Capillary: 108 mg/dL — ABNORMAL HIGH (ref 70–99)

## 2023-03-10 MED ORDER — HEPARIN NA (PORK) LOCK FLSH PF 100 UNIT/ML IV SOLN
2.5000 mL | INTRAVENOUS | 1 refills | Status: DC
  Filled 2023-03-10: qty 120, 28d supply, fill #0
  Filled 2023-04-06: qty 120, 28d supply, fill #1

## 2023-03-10 MED ORDER — FLUDEOXYGLUCOSE F - 18 (FDG) INJECTION
7.4000 | Freq: Once | INTRAVENOUS | Status: AC | PRN
  Administered 2023-03-10: 7.3 via INTRAVENOUS

## 2023-03-10 MED ORDER — SODIUM CHLORIDE 0.9% FLUSH
10.0000 mL | INTRAVENOUS | 1 refills | Status: DC
  Filled 2023-03-10: qty 240, 28d supply, fill #0
  Filled 2023-04-06: qty 240, 28d supply, fill #1

## 2023-03-11 ENCOUNTER — Other Ambulatory Visit: Payer: Self-pay | Admitting: Hematology & Oncology

## 2023-03-11 ENCOUNTER — Encounter: Payer: Self-pay | Admitting: Hematology & Oncology

## 2023-03-11 DIAGNOSIS — C160 Malignant neoplasm of cardia: Secondary | ICD-10-CM

## 2023-03-11 MED ORDER — TEMAZEPAM 7.5 MG PO CAPS: 7.5000 mg | ORAL_CAPSULE | Freq: Every evening | ORAL | 0 refills | Status: DC | PRN

## 2023-03-12 ENCOUNTER — Other Ambulatory Visit: Payer: Self-pay

## 2023-03-15 ENCOUNTER — Inpatient Hospital Stay: Payer: Medicare HMO

## 2023-03-15 ENCOUNTER — Encounter: Payer: Self-pay | Admitting: Hematology & Oncology

## 2023-03-15 VITALS — BP 135/62 | HR 72 | Temp 98.4°F | Resp 18 | Ht 69.0 in | Wt 146.0 lb

## 2023-03-15 DIAGNOSIS — Z452 Encounter for adjustment and management of vascular access device: Secondary | ICD-10-CM | POA: Diagnosis not present

## 2023-03-15 DIAGNOSIS — Z5112 Encounter for antineoplastic immunotherapy: Secondary | ICD-10-CM | POA: Diagnosis not present

## 2023-03-15 DIAGNOSIS — Z79620 Long term (current) use of immunosuppressive biologic: Secondary | ICD-10-CM | POA: Diagnosis not present

## 2023-03-15 DIAGNOSIS — C169 Malignant neoplasm of stomach, unspecified: Secondary | ICD-10-CM | POA: Diagnosis not present

## 2023-03-15 MED ORDER — SODIUM CHLORIDE 0.9% FLUSH
10.0000 mL | Freq: Once | INTRAVENOUS | Status: AC
  Administered 2023-03-15: 10 mL via INTRAVENOUS

## 2023-03-15 MED ORDER — HEPARIN SOD (PORK) LOCK FLUSH 100 UNIT/ML IV SOLN
250.0000 [IU] | Freq: Once | INTRAVENOUS | Status: AC
  Administered 2023-03-15: 250 [IU] via INTRAVENOUS

## 2023-03-15 NOTE — Patient Instructions (Signed)

## 2023-03-22 ENCOUNTER — Other Ambulatory Visit: Payer: Self-pay

## 2023-03-22 ENCOUNTER — Encounter: Payer: Self-pay | Admitting: Hematology & Oncology

## 2023-03-22 ENCOUNTER — Inpatient Hospital Stay: Payer: Medicare HMO

## 2023-03-22 ENCOUNTER — Inpatient Hospital Stay (HOSPITAL_BASED_OUTPATIENT_CLINIC_OR_DEPARTMENT_OTHER): Payer: Medicare HMO | Admitting: Hematology & Oncology

## 2023-03-22 VITALS — BP 128/82 | HR 63 | Resp 17

## 2023-03-22 VITALS — BP 135/66 | HR 67 | Temp 98.1°F | Resp 18 | Ht 69.0 in | Wt 143.0 lb

## 2023-03-22 DIAGNOSIS — C169 Malignant neoplasm of stomach, unspecified: Secondary | ICD-10-CM | POA: Diagnosis not present

## 2023-03-22 DIAGNOSIS — C160 Malignant neoplasm of cardia: Secondary | ICD-10-CM

## 2023-03-22 DIAGNOSIS — E032 Hypothyroidism due to medicaments and other exogenous substances: Secondary | ICD-10-CM

## 2023-03-22 DIAGNOSIS — Z452 Encounter for adjustment and management of vascular access device: Secondary | ICD-10-CM | POA: Diagnosis not present

## 2023-03-22 DIAGNOSIS — Z79620 Long term (current) use of immunosuppressive biologic: Secondary | ICD-10-CM | POA: Diagnosis not present

## 2023-03-22 DIAGNOSIS — Z5112 Encounter for antineoplastic immunotherapy: Secondary | ICD-10-CM | POA: Diagnosis not present

## 2023-03-22 LAB — TSH: TSH: 3.009 u[IU]/mL (ref 0.350–4.500)

## 2023-03-22 LAB — CMP (CANCER CENTER ONLY)
ALT: 9 U/L (ref 0–44)
AST: 13 U/L — ABNORMAL LOW (ref 15–41)
Albumin: 3.7 g/dL (ref 3.5–5.0)
Alkaline Phosphatase: 79 U/L (ref 38–126)
Anion gap: 6 (ref 5–15)
BUN: 18 mg/dL (ref 8–23)
CO2: 30 mmol/L (ref 22–32)
Calcium: 9 mg/dL (ref 8.9–10.3)
Chloride: 102 mmol/L (ref 98–111)
Creatinine: 0.99 mg/dL (ref 0.61–1.24)
GFR, Estimated: >60 mL/min (ref >60)
Glucose, Bld: 96 mg/dL (ref 70–99)
Potassium: 4.1 mmol/L (ref 3.5–5.1)
Sodium: 138 mmol/L (ref 135–145)
Total Bilirubin: 0.5 mg/dL (ref 0.3–1.2)
Total Protein: 6.4 g/dL — ABNORMAL LOW (ref 6.5–8.1)

## 2023-03-22 LAB — CBC WITH DIFFERENTIAL (CANCER CENTER ONLY)
Abs Immature Granulocytes: 0.02 10*3/uL (ref 0.00–0.07)
Basophils Absolute: 0.1 10*3/uL (ref 0.0–0.1)
Basophils Relative: 2 %
Eosinophils Absolute: 0.4 10*3/uL (ref 0.0–0.5)
Eosinophils Relative: 6 %
HCT: 38 % — ABNORMAL LOW (ref 39.0–52.0)
Hemoglobin: 12.2 g/dL — ABNORMAL LOW (ref 13.0–17.0)
Immature Granulocytes: 0 %
Lymphocytes Relative: 21 %
Lymphs Abs: 1.3 10*3/uL (ref 0.7–4.0)
MCH: 28.6 pg (ref 26.0–34.0)
MCHC: 32.1 g/dL (ref 30.0–36.0)
MCV: 89 fL (ref 80.0–100.0)
Monocytes Absolute: 0.8 10*3/uL (ref 0.1–1.0)
Monocytes Relative: 14 %
Neutro Abs: 3.6 10*3/uL (ref 1.7–7.7)
Neutrophils Relative %: 57 %
Platelet Count: 300 10*3/uL (ref 150–400)
RBC: 4.27 MIL/uL (ref 4.22–5.81)
RDW: 13.4 % (ref 11.5–15.5)
WBC Count: 6.2 10*3/uL (ref 4.0–10.5)
nRBC: 0 % (ref 0.0–0.2)

## 2023-03-22 LAB — LACTATE DEHYDROGENASE: LDH: 130 U/L (ref 98–192)

## 2023-03-22 MED ORDER — SODIUM CHLORIDE 0.9% FLUSH
10.0000 mL | INTRAVENOUS | Status: DC | PRN
  Administered 2023-03-22 (×2): 10 mL

## 2023-03-22 MED ORDER — SODIUM CHLORIDE 0.9 % IV SOLN: Freq: Once | INTRAVENOUS | Status: AC

## 2023-03-22 MED ORDER — HEPARIN SOD (PORK) LOCK FLUSH 100 UNIT/ML IV SOLN
500.0000 [IU] | Freq: Once | INTRAVENOUS | Status: AC | PRN
  Administered 2023-03-22: 500 [IU]

## 2023-03-22 MED ORDER — SODIUM CHLORIDE 0.9 % IV SOLN
360.0000 mg | INTRAVENOUS | Status: DC
  Administered 2023-03-22: 360 mg via INTRAVENOUS
  Filled 2023-03-22: qty 0

## 2023-03-22 MED ORDER — HYDROMORPHONE HCL 1 MG/ML IJ SOLN
1.0000 mg | Freq: Once | INTRAMUSCULAR | Status: AC
  Administered 2023-03-22: 1 mg via INTRAVENOUS
  Filled 2023-03-22: qty 1

## 2023-03-22 NOTE — Patient Instructions (Signed)
[facility]  Discharge Instructions: Thank you for choosing [facility] to provide your oncology and hematology care.   If you have a lab appointment with the Cancer Center, please go directly to the Cancer Center and check in at the registration area.  Wear comfortable clothing and clothing appropriate for easy access to any Portacath or PICC line.   We strive to give you quality time with your provider. You may need to reschedule your appointment if you arrive late (15 or more minutes).  Arriving late affects you and other patients whose appointments are after yours.  Also, if you miss three or more appointments without notifying the office, you may be dismissed from the clinic at the provider's discretion.      For prescription refill requests, have your pharmacy contact our office and allow 72 hours for refills to be completed.    Today you received the following chemotherapy and/or immunotherapy agents Opdivo.      To help prevent nausea and vomiting after your treatment, we encourage you to take your nausea medication as directed.  BELOW ARE SYMPTOMS THAT SHOULD BE REPORTED IMMEDIATELY: *FEVER GREATER THAN 100.4 F (38 C) OR HIGHER *CHILLS OR SWEATING *NAUSEA AND VOMITING THAT IS NOT CONTROLLED WITH YOUR NAUSEA MEDICATION *UNUSUAL SHORTNESS OF BREATH *UNUSUAL BRUISING OR BLEEDING *URINARY PROBLEMS (pain or burning when urinating, or frequent urination) *BOWEL PROBLEMS (unusual diarrhea, constipation, pain near the anus) TENDERNESS IN MOUTH AND THROAT WITH OR WITHOUT PRESENCE OF ULCERS (sore throat, sores in mouth, or a toothache) UNUSUAL RASH, SWELLING OR PAIN  UNUSUAL VAGINAL DISCHARGE OR ITCHING   Items with * indicate a potential emergency and should be followed up as soon as possible or go to the Emergency Department if any problems should occur.  Please show the CHEMOTHERAPY ALERT CARD or IMMUNOTHERAPY ALERT CARD at check-in  to the Emergency Department and triage nurse. Should you have questions after your visit or need to cancel or reschedule your appointment, please contact [facility]  [phone] and follow the prompts.  Office hours are 8:00 a.m. to 4:30 p.m. Monday - Friday. Please note that voicemails left after 4:00 p.m. may not be returned until the following business day.  We are closed weekends and major holidays. You have access to a nurse at all times for urgent questions. Please call the main number to the clinic [phone] and follow the prompts.  For any non-urgent questions, you may also contact your provider using MyChart. We now offer e-Visits for anyone 18 and older to request care online for non-urgent symptoms. For details visit [email or web address].   Also download the MyChart app! Go to the app store, search "MyChart", open the app, select [facility], and log in with your MyChart username and password.   

## 2023-03-22 NOTE — Patient Instructions (Signed)
PICC Home Care Guide A peripherally inserted central catheter (PICC) is a form of IV access that allows medicines and IV fluids to be quickly put into the blood and spread throughout the body. The PICC is a long, thin, flexible tube (catheter) that is put into a vein in a person's arm or leg. The catheter ends in a large vein just outside the heart called the superior vena cava (SVC). After the PICC is put in, a chest X-ray may be done to make sure that it is in the right place. A PICC may be placed for different reasons, such as: To give medicines and liquid nutrition. To give IV fluids and blood products. To take blood samples often. If there is trouble placing a peripheral intravenous (PIV) catheter. If cared for properly, a PICC can remain in place for many months. Having a PICC can allow you to go home from the hospital sooner and continue treatment at home. Medicines and PICC care can be managed at home by a family member, caregiver, or home health care team. What are the risks? Generally, having a PICC is safe. However, problems may occur, including: A blood clot (thrombus) forming in or at the end of the PICC. A blood clot forming in a vein (deep vein thrombosis) or traveling to the lung (pulmonary embolism). Inflammation of the vein (phlebitis) in which the PICC is placed. Infection at the insertion site or in the blood. Blood infections from central lines, like PICCs, can be serious and often require a hospital stay. PICC malposition, or PICC movement or poor placement. A break or cut in the PICC. Do not use scissors near the PICC. Nerve or tendon irritation or injury during PICC insertion. How to care for your PICC Please follow the specific guidelines provided by your health care provider. Preventing infection You and any caregivers should wash your hands often with soap and water for at least 20 seconds. Wash hands: Before touching the PICC or the infusion device. Before changing a  bandage (dressing). Do not change the dressing unless you have been taught to do so and have shown you are able to change it safely. Flush the PICC as told. Tell your health care provider right away if the PICC is hard to flush or does not flush. Do not use force to flush the PICC. Use clean and germ-free (sterile) supplies only. Keep the supplies in a dry place. Do not reuse needles, syringes, or any other supplies. Reusing supplies can lead to infection. Keep the PICC dressing dry and secure it with tape if the edges stop sticking to your skin. Check your PICC insertion site every day for signs of infection. Check for: Redness, swelling, or pain. Fluid or blood. Warmth. Pus or a bad smell. Preventing other problems Do not use a syringe that is less than 10 mL to flush the PICC. Do not have your blood pressure checked on the arm in which the PICC is placed. Do not ever pull or tug on the PICC. Keep it secured to your arm with tape or a stretch wrap when not in use. Do not take the PICC out yourself. Only a trained health care provider should remove the PICC. Keep pets and children away from your PICC. How to care for your PICC dressing Keep your PICC dressing clean and dry to prevent infection. Do not take baths, swim, or use a hot tub until your health care provider approves. Ask your health care provider if you can take  showers. You may only be allowed to take sponge baths. When you are allowed to shower: Ask your health care provider to teach you how to wrap the PICC. Cover the PICC with clear plastic wrap and tape to keep it dry while showering. Follow instructions from your health care provider about how to take care of your insertion site and dressing. Make sure you: Wash your hands with soap and water for at least 20 seconds before and after you change your dressing. If soap and water are not available, use hand sanitizer. Change your dressing only if taught to do so by your health care  provider. Your PICC dressing needs to be changed if it becomes loose or wet. Leave stitches (sutures), skin glue, or adhesive strips in place. These skin closures may need to stay in place for 2 weeks or longer. If adhesive strip edges start to loosen and curl up, you may trim the loose edges. Do not remove adhesive strips completely unless your health care provider tells you to do that. Follow these instructions at home: Disposal of supplies Throw away any syringes in a disposal container that is meant for sharp items (sharps container). You can buy a sharps container from a pharmacy, or you can make one by using an empty, hard plastic bottle with a lid. Place any used dressings or infusion bags into a plastic bag. Throw that bag in the trash. General instructions  Always carry your PICC identification card or wear a medical alert bracelet. Keep the tube clamped at all times, unless it is being used. Always carry a smooth-edge clamp with you to clamp the PICC if it breaks. Do not use scissors or sharp objects near the tube. You may bend your arm and move it freely. If your PICC is near or at the bend of your elbow, avoid activity with repeated motion at the elbow. Avoid lifting heavy objects as told by your health care provider. Keep all follow-up visits. This is important. You will need to have your PICC dressing changed at least once a week. Contact a health care provider if: You have pain in your arm, ear, face, or teeth. You have a fever or chills. You have redness, swelling, or pain around the insertion site. You have fluid or blood coming from the insertion site. Your insertion site feels warm to the touch. You have pus or a bad smell coming from the insertion site. Your skin feels hard and raised around the insertion site. Your PICC dressing has gotten wet or is coming off and you have not been taught how to change it. Get help right away if: You have problems with your PICC, such as  your PICC: Was tugged or pulled and has partially come out. Do not  push the PICC back in. Cannot be flushed, is hard to flush, or leaks around the insertion site when it is flushed. Makes a flushing sound when it is flushed. Appears to have a hole or tear. Is accidentally pulled all the way out. If this happens, cover the insertion site with a gauze dressing. Do not throw the PICC away. Your health care provider will need to check it to be sure the entire catheter came out. You feel your heart racing or skipping beats, or you have chest pain. You have shortness of breath or trouble breathing. You have swelling, redness, warmth, or pain in the arm in which the PICC is placed. You have a red streak going up your arm that  starts under the PICC dressing. These symptoms may be an emergency. Get help right away. Call 911. Do not wait to see if the symptoms will go away. Do not drive yourself to the hospital. Summary A peripherally inserted central catheter (PICC) is a long, thin, flexible tube (catheter) that is put into a vein in the arm or leg. If cared for properly, a PICC can remain in place for many months. Having a PICC can allow you to go home from the hospital sooner and continue treatment at home. The PICC is inserted using a germ-free (sterile) technique by a specially trained health care provider. Only a trained health care provider should remove it. Do not have your blood pressure checked on the arm in which your PICC is placed. Always keep your PICC identification card with you. This information is not intended to replace advice given to you by your health care provider. Make sure you discuss any questions you have with your health care provider. Document Revised: 12/10/2020 Document Reviewed: 12/10/2020 Elsevier Patient Education  2024 [employer].

## 2023-03-22 NOTE — Progress Notes (Signed)
Hematology and Oncology Follow Up Visit  George Dyer [id number] 10-17-1940 82 y.o. 03/22/2023   Principle Diagnosis:  Stage IIB (T3N0M0) Gastric cancer -- declines surgery -recurrent-  HER2(-)/ BRAF (+)   Current Therapy:        FOLFOX - s/p cycle #4-  start on 09/15/2021 XRT/Xeloda -- start on 12/07/2021 --completed on 01/12/2022 FOLFIRI/Opdivo -- st/p cycle #1 on 10/19/2022 -- d/c FOLFIRI on 11/02/2022 --changed Opdivo to 21-day cycles on 12/22/2022   Interim History:  George Dyer is here today with his wife for follow-up and treatment.  We did go ahead and do a PET scan on him.  This was done on 03/10/2023.  The PET scan showed that there was no evidence of disease progression.  He had decrease in the activity in the stomach tumor.  It was 9.1 SUV previously.  Now it is 7.4 SUV.  There is no size given..  The problem that he is having is that he is having hard time swallowing.  He says that when he eats anything solid, he does seem to come back up.  Unfortunately, organ have to get a upper endoscopy.  My do not know if this is a esophageal stricture.  I do not know if this is the actual tumor causing any kind of gastric outlet obstruction.  If there is any kind of gastric outlet obstruction, he will not take surgery.  He will not take a feeding tube.  I have to believe that everything is working.  I am really impressed that the PET scan does not show any activity that is worse.  He still has a lot of neuropathy in the legs.  This has been chronic.  He has had no bleeding.  He has had no fever.  He has had no cough or shortness of breath.  He has had no headache.  Overall, I would say that his performance status is probably ECOG 1.   Medications:  Allergies as of 03/22/2023       Reactions   Percocet [oxycodone-acetaminophen] Nausea And Vomiting   Oxycodone Other (See Comments)   Doesn't remember   Oxycodone-aspirin Nausea And Vomiting        Medication List         Accurate as of March 22, 2023 12:49 PM. If you have any questions, ask your nurse or doctor.          acetaminophen 325 MG tablet Commonly known as: TYLENOL Take 650 mg by mouth 2 (two) times daily as needed.   alum & mag hydroxide-simeth 200-200-20 MG/5ML suspension Commonly known as: MAALOX/MYLANTA Take 30 mLs by mouth daily.   dexamethasone 4 MG tablet Commonly known as: DECADRON Take 2 tablets (8 mg total) by mouth daily. Start the day after chemotherapy for 2 days. Take with food.   doxylamine (Sleep) 25 MG tablet Commonly known as: UNISOM Take 25 mg by mouth at bedtime as needed.   dronabinol 2.5 MG capsule Commonly known as: MARINOL Take 1 capsule (2.5 mg total) by mouth 2 (two) times daily before a meal.   DULoxetine 30 MG capsule Commonly known as: Cymbalta Take 2 capsules (60 mg total) by mouth at bedtime. Take 1 capsule (30 mg) for the 1st week, then increase to 2 capsules thereafter.   Heparin Na (Pork) Lock Flsh PF 100 UNIT/ML Soln Flush each PICC lumen with 2.5 mLs (250 Units total) as directed every Monday, Wednesday, and Friday.   lactulose 10 GM/15ML solution Commonly known as: CHRONULAC  Take 30 mLs (20 g total) by mouth 3 (three) times daily.   levothyroxine 75 MCG tablet Commonly known as: SYNTHROID Take 75 mcg by mouth daily.   levothyroxine 100 MCG tablet Commonly known as: SYNTHROID TAKE 1 TABLET BY MOUTH DAILY BEFORE BREAKFAST.   loperamide 2 MG capsule Commonly known as: IMODIUM Take 2 tabs by mouth with first loose stool, then 1 tab with each additional loose stool as needed. Do not exceed 8 tabs in a 24-hour period   megestrol 40 MG/ML suspension Commonly known as: MEGACE TAKE 10 MLS (400 MG TOTAL) BY MOUTH 2 (TWO) TIMES DAILY.   megestrol 625 MG/5ML suspension Commonly known as: MEGACE ES Take 5 mLs (625 mg total) by mouth daily.   metFORMIN 500 MG tablet Commonly known as: GLUCOPHAGE Take 1 tablet (500 mg total) by mouth daily  with breakfast.   Normal Saline Flush 0.9 % Soln Inject 10 mLs into each port 3 (three) times a week. Flush each port of PICC Monday, Wednesday and Friday.   omeprazole 40 MG capsule Commonly known as: PRILOSEC TAKE 1 CAPSULE (40 MG TOTAL) BY MOUTH DAILY.   ondansetron 8 MG tablet Commonly known as: ZOFRAN Take 1 tablet (8 mg total) by mouth every 8 (eight) hours as needed for nausea, vomiting or refractory nausea / vomiting. Start on the third day after chemotherapy.   polyethylene glycol powder 17 GM/SCOOP powder Commonly known as: GlycoLax Take 17 g by mouth 2 (two) times daily.   prochlorperazine 10 MG tablet Commonly known as: COMPAZINE Take 1 tablet (10 mg total) by mouth every 6 (six) hours as needed for nausea or vomiting.   temazepam 7.5 MG capsule Commonly known as: RESTORIL Take 1 capsule (7.5 mg total) by mouth at bedtime as needed for sleep.   traMADol 50 MG tablet Commonly known as: ULTRAM Take by mouth.        Allergies:  Allergies  Allergen Reactions   Percocet [Oxycodone-Acetaminophen] Nausea And Vomiting   Oxycodone Other (See Comments)    Doesn't remember   Oxycodone-Aspirin Nausea And Vomiting    Past Medical History, Surgical history, Social history, and Family History were reviewed and updated.  Review of Systems: Review of Systems  Constitutional: Negative.   HENT: Negative.    Eyes: Negative.   Respiratory: Negative.    Cardiovascular: Negative.   Gastrointestinal:  Positive for nausea and vomiting.  Genitourinary: Negative.   Musculoskeletal: Negative.   Skin: Negative.   Neurological: Negative.   Endo/Heme/Allergies: Negative.   Psychiatric/Behavioral: Negative.     Marland Kitchen   Physical Exam:  height is 5\' 9"  (1.753 m) and weight is 143 lb (64.9 kg). His oral temperature is 98.1 F (36.7 C). His blood pressure is 135/66 and his pulse is 67. His respiration is 18 and oxygen saturation is 100%.   Wt Readings from Last 3 Encounters:   03/22/23 143 lb (64.9 kg)  03/15/23 146 lb (66.2 kg)  03/08/23 148 lb 8 oz (67.4 kg)    Physical Exam Vitals reviewed.  HENT:     Head: Normocephalic and atraumatic.  Eyes:     Pupils: Pupils are equal, round, and reactive to light.  Cardiovascular:     Rate and Rhythm: Normal rate and regular rhythm.     Heart sounds: Normal heart sounds.  Pulmonary:     Effort: Pulmonary effort is normal.     Breath sounds: Normal breath sounds.  Abdominal:     General: Bowel sounds are normal.  Palpations: Abdomen is soft.  Musculoskeletal:        General: No tenderness or deformity. Normal range of motion.     Cervical back: Normal range of motion.  Lymphadenopathy:     Cervical: No cervical adenopathy.  Skin:    General: Skin is warm and dry.     Findings: No erythema or rash.  Neurological:     Mental Status: He is alert and oriented to person, place, and time.  Psychiatric:        Behavior: Behavior normal.        Thought Content: Thought content normal.        Judgment: Judgment normal.     Lab Results  Component Value Date   WBC 6.2 03/22/2023   HGB 12.2 (L) 03/22/2023   HCT 38.0 (L) 03/22/2023   MCV 89.0 03/22/2023   PLT 300 03/22/2023   Lab Results  Component Value Date   FERRITIN 74 10/04/2022   IRON 93 10/04/2022   TIBC 279 10/04/2022   UIBC 186 10/04/2022   IRONPCTSAT 33 10/04/2022   Lab Results  Component Value Date   RETICCTPCT 0.7 08/18/2021   RBC 4.27 03/22/2023   No results found for: "KPAFRELGTCHN", "LAMBDASER", "KAPLAMBRATIO" Lab Results  Component Value Date   IGGSERUM 931 05/05/2022   IGMSERUM 59 05/05/2022   No results found for: "TOTALPROTELP", "ALBUMINELP", "A1GS", "A2GS", "BETS", "BETA2SER", "GAMS", "MSPIKE", "SPEI"   Chemistry      Component Value Date/Time   NA 138 03/22/2023 1118   NA 139 07/23/2020 0818   K 4.1 03/22/2023 1118   CL 102 03/22/2023 1118   CO2 30 03/22/2023 1118   BUN 18 03/22/2023 1118   BUN 10 07/23/2020 0818    CREATININE 0.99 03/22/2023 1118   CREATININE 1.06 06/27/2015 0837      Component Value Date/Time   CALCIUM 9.0 03/22/2023 1118   ALKPHOS 79 03/22/2023 1118   AST 13 (L) 03/22/2023 1118   ALT 9 03/22/2023 1118   BILITOT 0.5 03/22/2023 1118       Impression and Plan: Mr. Borey is a very pleasant 82 yo caucasian gentleman with localized gastric cancer. So far, he has been very reluctant to undergo surgery for treatment of this localized disease.  Again, the PET scan does not show any evidence of disease progression.  However, he is having the symptoms of vomiting.  Again this might be an esophageal stricture.  It might be from the tumor itself.  Regardless, he needs to have a upper endoscopy.  I will call Dr. Chales Abrahams [facility] who has seen him before.  We will go ahead with his nivolumab today.  I think this is helping him out.  I would like to get him back to see [place] in another 3 weeks.  Again, a lot will be dictated future wise by an upper endoscopy.  Marland Kitchen   Josph Macho, MD 10/15/202412:49 PM

## 2023-03-23 ENCOUNTER — Other Ambulatory Visit: Payer: Self-pay

## 2023-03-23 MED FILL — Sodium Chloride IV Soln 0.9%: INTRAVENOUS | Qty: 100 | Status: AC

## 2023-03-23 MED FILL — Nivolumab IV Soln 240 MG/24ML: INTRAVENOUS | Qty: 24 | Status: AC

## 2023-03-23 MED FILL — Nivolumab IV Soln 120 MG/12ML: INTRAVENOUS | Qty: 12 | Status: AC

## 2023-03-24 ENCOUNTER — Telehealth: Payer: Self-pay

## 2023-03-24 NOTE — Telephone Encounter (Signed)
Spoke to patient's wife and told her the date and time of the egd Dr. Chales Abrahams requested I schedule.  They will look at the instructions in mychart and call me if they have any questions.

## 2023-03-29 ENCOUNTER — Inpatient Hospital Stay: Payer: Medicare HMO

## 2023-03-29 VITALS — BP 133/69 | HR 75 | Temp 98.3°F | Resp 18 | Wt 145.0 lb

## 2023-03-29 DIAGNOSIS — Z452 Encounter for adjustment and management of vascular access device: Secondary | ICD-10-CM

## 2023-03-29 DIAGNOSIS — Z5112 Encounter for antineoplastic immunotherapy: Secondary | ICD-10-CM | POA: Diagnosis not present

## 2023-03-29 DIAGNOSIS — Z79620 Long term (current) use of immunosuppressive biologic: Secondary | ICD-10-CM | POA: Diagnosis not present

## 2023-03-29 DIAGNOSIS — C169 Malignant neoplasm of stomach, unspecified: Secondary | ICD-10-CM | POA: Diagnosis not present

## 2023-03-29 MED ORDER — SODIUM CHLORIDE 0.9% FLUSH
10.0000 mL | Freq: Once | INTRAVENOUS | Status: AC
  Administered 2023-03-29: 10 mL

## 2023-03-29 MED ORDER — HEPARIN SOD (PORK) LOCK FLUSH 100 UNIT/ML IV SOLN
250.0000 [IU] | Freq: Once | INTRAVENOUS | Status: AC
  Administered 2023-03-29: 250 [IU] via INTRAVENOUS

## 2023-03-29 NOTE — Patient Instructions (Signed)
PICC Home Care Guide A peripherally inserted central catheter (PICC) is a form of IV access that allows medicines and IV fluids to be quickly put into the blood and spread throughout the body. The PICC is a long, thin, flexible tube (catheter) that is put into a vein in a person's arm or leg. The catheter ends in a large vein just outside the heart called the superior vena cava (SVC). After the PICC is put in, a chest X-ray may be done to make sure that it is in the right place. A PICC may be placed for different reasons, such as: To give medicines and liquid nutrition. To give IV fluids and blood products. To take blood samples often. If there is trouble placing a peripheral intravenous (PIV) catheter. If cared for properly, a PICC can remain in place for many months. Having a PICC can allow you to go home from the hospital sooner and continue treatment at home. Medicines and PICC care can be managed at home by a family member, caregiver, or home health care team. What are the risks? Generally, having a PICC is safe. However, problems may occur, including: A blood clot (thrombus) forming in or at the end of the PICC. A blood clot forming in a vein (deep vein thrombosis) or traveling to the lung (pulmonary embolism). Inflammation of the vein (phlebitis) in which the PICC is placed. Infection at the insertion site or in the blood. Blood infections from central lines, like PICCs, can be serious and often require a hospital stay. PICC malposition, or PICC movement or poor placement. A break or cut in the PICC. Do not use scissors near the PICC. Nerve or tendon irritation or injury during PICC insertion. How to care for your PICC Please follow the specific guidelines provided by your health care provider. Preventing infection You and any caregivers should wash your hands often with soap and water for at least 20 seconds. Wash hands: Before touching the PICC or the infusion device. Before changing a  bandage (dressing). Do not change the dressing unless you have been taught to do so and have shown you are able to change it safely. Flush the PICC as told. Tell your health care provider right away if the PICC is hard to flush or does not flush. Do not use force to flush the PICC. Use clean and germ-free (sterile) supplies only. Keep the supplies in a dry place. Do not reuse needles, syringes, or any other supplies. Reusing supplies can lead to infection. Keep the PICC dressing dry and secure it with tape if the edges stop sticking to your skin. Check your PICC insertion site every day for signs of infection. Check for: Redness, swelling, or pain. Fluid or blood. Warmth. Pus or a bad smell. Preventing other problems Do not use a syringe that is less than 10 mL to flush the PICC. Do not have your blood pressure checked on the arm in which the PICC is placed. Do not ever pull or tug on the PICC. Keep it secured to your arm with tape or a stretch wrap when not in use. Do not take the PICC out yourself. Only a trained health care provider should remove the PICC. Keep pets and children away from your PICC. How to care for your PICC dressing Keep your PICC dressing clean and dry to prevent infection. Do not take baths, swim, or use a hot tub until your health care provider approves. Ask your health care provider if you can take  showers. You may only be allowed to take sponge baths. When you are allowed to shower: Ask your health care provider to teach you how to wrap the PICC. Cover the PICC with clear plastic wrap and tape to keep it dry while showering. Follow instructions from your health care provider about how to take care of your insertion site and dressing. Make sure you: Wash your hands with soap and water for at least 20 seconds before and after you change your dressing. If soap and water are not available, use hand sanitizer. Change your dressing only if taught to do so by your health care  provider. Your PICC dressing needs to be changed if it becomes loose or wet. Leave stitches (sutures), skin glue, or adhesive strips in place. These skin closures may need to stay in place for 2 weeks or longer. If adhesive strip edges start to loosen and curl up, you may trim the loose edges. Do not remove adhesive strips completely unless your health care provider tells you to do that. Follow these instructions at home: Disposal of supplies Throw away any syringes in a disposal container that is meant for sharp items (sharps container). You can buy a sharps container from a pharmacy, or you can make one by using an empty, hard plastic bottle with a lid. Place any used dressings or infusion bags into a plastic bag. Throw that bag in the trash. General instructions  Always carry your PICC identification card or wear a medical alert bracelet. Keep the tube clamped at all times, unless it is being used. Always carry a smooth-edge clamp with you to clamp the PICC if it breaks. Do not use scissors or sharp objects near the tube. You may bend your arm and move it freely. If your PICC is near or at the bend of your elbow, avoid activity with repeated motion at the elbow. Avoid lifting heavy objects as told by your health care provider. Keep all follow-up visits. This is important. You will need to have your PICC dressing changed at least once a week. Contact a health care provider if: You have pain in your arm, ear, face, or teeth. You have a fever or chills. You have redness, swelling, or pain around the insertion site. You have fluid or blood coming from the insertion site. Your insertion site feels warm to the touch. You have pus or a bad smell coming from the insertion site. Your skin feels hard and raised around the insertion site. Your PICC dressing has gotten wet or is coming off and you have not been taught how to change it. Get help right away if: You have problems with your PICC, such as  your PICC: Was tugged or pulled and has partially come out. Do not  push the PICC back in. Cannot be flushed, is hard to flush, or leaks around the insertion site when it is flushed. Makes a flushing sound when it is flushed. Appears to have a hole or tear. Is accidentally pulled all the way out. If this happens, cover the insertion site with a gauze dressing. Do not throw the PICC away. Your health care provider will need to check it to be sure the entire catheter came out. You feel your heart racing or skipping beats, or you have chest pain. You have shortness of breath or trouble breathing. You have swelling, redness, warmth, or pain in the arm in which the PICC is placed. You have a red streak going up your arm that  starts under the PICC dressing. These symptoms may be an emergency. Get help right away. Call 911. Do not wait to see if the symptoms will go away. Do not drive yourself to the hospital. Summary A peripherally inserted central catheter (PICC) is a long, thin, flexible tube (catheter) that is put into a vein in the arm or leg. If cared for properly, a PICC can remain in place for many months. Having a PICC can allow you to go home from the hospital sooner and continue treatment at home. The PICC is inserted using a germ-free (sterile) technique by a specially trained health care provider. Only a trained health care provider should remove it. Do not have your blood pressure checked on the arm in which your PICC is placed. Always keep your PICC identification card with you. This information is not intended to replace advice given to you by your health care provider. Make sure you discuss any questions you have with your health care provider. Document Revised: 12/10/2020 Document Reviewed: 12/10/2020 Elsevier Patient Education  2024 [employer].

## 2023-04-01 ENCOUNTER — Inpatient Hospital Stay: Payer: Medicare HMO

## 2023-04-01 VITALS — BP 139/81 | HR 71 | Temp 97.6°F | Resp 20

## 2023-04-01 DIAGNOSIS — Z79620 Long term (current) use of immunosuppressive biologic: Secondary | ICD-10-CM | POA: Diagnosis not present

## 2023-04-01 DIAGNOSIS — C169 Malignant neoplasm of stomach, unspecified: Secondary | ICD-10-CM | POA: Diagnosis not present

## 2023-04-01 DIAGNOSIS — Z452 Encounter for adjustment and management of vascular access device: Secondary | ICD-10-CM | POA: Diagnosis not present

## 2023-04-01 DIAGNOSIS — Z5112 Encounter for antineoplastic immunotherapy: Secondary | ICD-10-CM | POA: Diagnosis not present

## 2023-04-01 MED ORDER — SODIUM CHLORIDE 0.9% FLUSH
10.0000 mL | Freq: Once | INTRAVENOUS | Status: AC
  Administered 2023-04-01: 10 mL via INTRAVENOUS

## 2023-04-01 MED ORDER — HEPARIN SOD (PORK) LOCK FLUSH 100 UNIT/ML IV SOLN
500.0000 [IU] | Freq: Once | INTRAVENOUS | Status: AC
Start: 2023-04-01 — End: 2023-04-01
  Administered 2023-04-01: 250 [IU] via INTRAVENOUS

## 2023-04-01 MED ORDER — SODIUM CHLORIDE 0.9% FLUSH
10.0000 mL | Freq: Once | INTRAVENOUS | Status: AC
Start: 2023-04-01 — End: 2023-04-01
  Administered 2023-04-01: 10 mL via INTRAVENOUS

## 2023-04-01 NOTE — Patient Instructions (Signed)
PICC Home Care Guide A peripherally inserted central catheter (PICC) is a form of IV access that allows medicines and IV fluids to be quickly put into the blood and spread throughout the body. The PICC is a long, thin, flexible tube (catheter) that is put into a vein in a person's arm or leg. The catheter ends in a large vein just outside the heart called the superior vena cava (SVC). After the PICC is put in, a chest X-ray may be done to make sure that it is in the right place. A PICC may be placed for different reasons, such as: To give medicines and liquid nutrition. To give IV fluids and blood products. To take blood samples often. If there is trouble placing a peripheral intravenous (PIV) catheter. If cared for properly, a PICC can remain in place for many months. Having a PICC can allow you to go home from the hospital sooner and continue treatment at home. Medicines and PICC care can be managed at home by a family member, caregiver, or home health care team. What are the risks? Generally, having a PICC is safe. However, problems may occur, including: A blood clot (thrombus) forming in or at the end of the PICC. A blood clot forming in a vein (deep vein thrombosis) or traveling to the lung (pulmonary embolism). Inflammation of the vein (phlebitis) in which the PICC is placed. Infection at the insertion site or in the blood. Blood infections from central lines, like PICCs, can be serious and often require a hospital stay. PICC malposition, or PICC movement or poor placement. A break or cut in the PICC. Do not use scissors near the PICC. Nerve or tendon irritation or injury during PICC insertion. How to care for your PICC Please follow the specific guidelines provided by your health care provider. Preventing infection You and any caregivers should wash your hands often with soap and water for at least 20 seconds. Wash hands: Before touching the PICC or the infusion device. Before changing a  bandage (dressing). Do not change the dressing unless you have been taught to do so and have shown you are able to change it safely. Flush the PICC as told. Tell your health care provider right away if the PICC is hard to flush or does not flush. Do not use force to flush the PICC. Use clean and germ-free (sterile) supplies only. Keep the supplies in a dry place. Do not reuse needles, syringes, or any other supplies. Reusing supplies can lead to infection. Keep the PICC dressing dry and secure it with tape if the edges stop sticking to your skin. Check your PICC insertion site every day for signs of infection. Check for: Redness, swelling, or pain. Fluid or blood. Warmth. Pus or a bad smell. Preventing other problems Do not use a syringe that is less than 10 mL to flush the PICC. Do not have your blood pressure checked on the arm in which the PICC is placed. Do not ever pull or tug on the PICC. Keep it secured to your arm with tape or a stretch wrap when not in use. Do not take the PICC out yourself. Only a trained health care provider should remove the PICC. Keep pets and children away from your PICC. How to care for your PICC dressing Keep your PICC dressing clean and dry to prevent infection. Do not take baths, swim, or use a hot tub until your health care provider approves. Ask your health care provider if you can take  showers. You may only be allowed to take sponge baths. When you are allowed to shower: Ask your health care provider to teach you how to wrap the PICC. Cover the PICC with clear plastic wrap and tape to keep it dry while showering. Follow instructions from your health care provider about how to take care of your insertion site and dressing. Make sure you: Wash your hands with soap and water for at least 20 seconds before and after you change your dressing. If soap and water are not available, use hand sanitizer. Change your dressing only if taught to do so by your health care  provider. Your PICC dressing needs to be changed if it becomes loose or wet. Leave stitches (sutures), skin glue, or adhesive strips in place. These skin closures may need to stay in place for 2 weeks or longer. If adhesive strip edges start to loosen and curl up, you may trim the loose edges. Do not remove adhesive strips completely unless your health care provider tells you to do that. Follow these instructions at home: Disposal of supplies Throw away any syringes in a disposal container that is meant for sharp items (sharps container). You can buy a sharps container from a pharmacy, or you can make one by using an empty, hard plastic bottle with a lid. Place any used dressings or infusion bags into a plastic bag. Throw that bag in the trash. General instructions  Always carry your PICC identification card or wear a medical alert bracelet. Keep the tube clamped at all times, unless it is being used. Always carry a smooth-edge clamp with you to clamp the PICC if it breaks. Do not use scissors or sharp objects near the tube. You may bend your arm and move it freely. If your PICC is near or at the bend of your elbow, avoid activity with repeated motion at the elbow. Avoid lifting heavy objects as told by your health care provider. Keep all follow-up visits. This is important. You will need to have your PICC dressing changed at least once a week. Contact a health care provider if: You have pain in your arm, ear, face, or teeth. You have a fever or chills. You have redness, swelling, or pain around the insertion site. You have fluid or blood coming from the insertion site. Your insertion site feels warm to the touch. You have pus or a bad smell coming from the insertion site. Your skin feels hard and raised around the insertion site. Your PICC dressing has gotten wet or is coming off and you have not been taught how to change it. Get help right away if: You have problems with your PICC, such as  your PICC: Was tugged or pulled and has partially come out. Do not  push the PICC back in. Cannot be flushed, is hard to flush, or leaks around the insertion site when it is flushed. Makes a flushing sound when it is flushed. Appears to have a hole or tear. Is accidentally pulled all the way out. If this happens, cover the insertion site with a gauze dressing. Do not throw the PICC away. Your health care provider will need to check it to be sure the entire catheter came out. You feel your heart racing or skipping beats, or you have chest pain. You have shortness of breath or trouble breathing. You have swelling, redness, warmth, or pain in the arm in which the PICC is placed. You have a red streak going up your arm that  starts under the PICC dressing. These symptoms may be an emergency. Get help right away. Call 911. Do not wait to see if the symptoms will go away. Do not drive yourself to the hospital. Summary A peripherally inserted central catheter (PICC) is a long, thin, flexible tube (catheter) that is put into a vein in the arm or leg. If cared for properly, a PICC can remain in place for many months. Having a PICC can allow you to go home from the hospital sooner and continue treatment at home. The PICC is inserted using a germ-free (sterile) technique by a specially trained health care provider. Only a trained health care provider should remove it. Do not have your blood pressure checked on the arm in which your PICC is placed. Always keep your PICC identification card with you. This information is not intended to replace advice given to you by your health care provider. Make sure you discuss any questions you have with your health care provider. Document Revised: 12/10/2020 Document Reviewed: 12/10/2020 Elsevier Patient Education  2024 [employer].

## 2023-04-05 ENCOUNTER — Inpatient Hospital Stay: Payer: Medicare HMO

## 2023-04-05 VITALS — BP 153/66 | HR 73 | Temp 97.7°F | Resp 18

## 2023-04-05 DIAGNOSIS — C169 Malignant neoplasm of stomach, unspecified: Secondary | ICD-10-CM | POA: Diagnosis not present

## 2023-04-05 DIAGNOSIS — Z452 Encounter for adjustment and management of vascular access device: Secondary | ICD-10-CM | POA: Diagnosis not present

## 2023-04-05 DIAGNOSIS — Z5112 Encounter for antineoplastic immunotherapy: Secondary | ICD-10-CM | POA: Diagnosis not present

## 2023-04-05 DIAGNOSIS — Z79620 Long term (current) use of immunosuppressive biologic: Secondary | ICD-10-CM | POA: Diagnosis not present

## 2023-04-05 MED ORDER — HEPARIN SOD (PORK) LOCK FLUSH 100 UNIT/ML IV SOLN
500.0000 [IU] | Freq: Once | INTRAVENOUS | Status: AC
  Administered 2023-04-05: 500 [IU] via INTRAVENOUS

## 2023-04-05 MED ORDER — SODIUM CHLORIDE 0.9% FLUSH
10.0000 mL | INTRAVENOUS | Status: DC | PRN
  Administered 2023-04-05: 10 mL via INTRAVENOUS

## 2023-04-05 NOTE — Patient Instructions (Signed)
PICC Home Care Guide A peripherally inserted central catheter (PICC) is a form of IV access that allows medicines and IV fluids to be quickly put into the blood and spread throughout the body. The PICC is a long, thin, flexible tube (catheter) that is put into a vein in a person's arm or leg. The catheter ends in a large vein just outside the heart called the superior vena cava (SVC). After the PICC is put in, a chest X-ray may be done to make sure that it is in the right place. A PICC may be placed for different reasons, such as: To give medicines and liquid nutrition. To give IV fluids and blood products. To take blood samples often. If there is trouble placing a peripheral intravenous (PIV) catheter. If cared for properly, a PICC can remain in place for many months. Having a PICC can allow you to go home from the hospital sooner and continue treatment at home. Medicines and PICC care can be managed at home by a family member, caregiver, or home health care team. What are the risks? Generally, having a PICC is safe. However, problems may occur, including: A blood clot (thrombus) forming in or at the end of the PICC. A blood clot forming in a vein (deep vein thrombosis) or traveling to the lung (pulmonary embolism). Inflammation of the vein (phlebitis) in which the PICC is placed. Infection at the insertion site or in the blood. Blood infections from central lines, like PICCs, can be serious and often require a hospital stay. PICC malposition, or PICC movement or poor placement. A break or cut in the PICC. Do not use scissors near the PICC. Nerve or tendon irritation or injury during PICC insertion. How to care for your PICC Please follow the specific guidelines provided by your health care provider. Preventing infection You and any caregivers should wash your hands often with soap and water for at least 20 seconds. Wash hands: Before touching the PICC or the infusion device. Before changing a  bandage (dressing). Do not change the dressing unless you have been taught to do so and have shown you are able to change it safely. Flush the PICC as told. Tell your health care provider right away if the PICC is hard to flush or does not flush. Do not use force to flush the PICC. Use clean and germ-free (sterile) supplies only. Keep the supplies in a dry place. Do not reuse needles, syringes, or any other supplies. Reusing supplies can lead to infection. Keep the PICC dressing dry and secure it with tape if the edges stop sticking to your skin. Check your PICC insertion site every day for signs of infection. Check for: Redness, swelling, or pain. Fluid or blood. Warmth. Pus or a bad smell. Preventing other problems Do not use a syringe that is less than 10 mL to flush the PICC. Do not have your blood pressure checked on the arm in which the PICC is placed. Do not ever pull or tug on the PICC. Keep it secured to your arm with tape or a stretch wrap when not in use. Do not take the PICC out yourself. Only a trained health care provider should remove the PICC. Keep pets and children away from your PICC. How to care for your PICC dressing Keep your PICC dressing clean and dry to prevent infection. Do not take baths, swim, or use a hot tub until your health care provider approves. Ask your health care provider if you can take  showers. You may only be allowed to take sponge baths. When you are allowed to shower: Ask your health care provider to teach you how to wrap the PICC. Cover the PICC with clear plastic wrap and tape to keep it dry while showering. Follow instructions from your health care provider about how to take care of your insertion site and dressing. Make sure you: Wash your hands with soap and water for at least 20 seconds before and after you change your dressing. If soap and water are not available, use hand sanitizer. Change your dressing only if taught to do so by your health care  provider. Your PICC dressing needs to be changed if it becomes loose or wet. Leave stitches (sutures), skin glue, or adhesive strips in place. These skin closures may need to stay in place for 2 weeks or longer. If adhesive strip edges start to loosen and curl up, you may trim the loose edges. Do not remove adhesive strips completely unless your health care provider tells you to do that. Follow these instructions at home: Disposal of supplies Throw away any syringes in a disposal container that is meant for sharp items (sharps container). You can buy a sharps container from a pharmacy, or you can make one by using an empty, hard plastic bottle with a lid. Place any used dressings or infusion bags into a plastic bag. Throw that bag in the trash. General instructions  Always carry your PICC identification card or wear a medical alert bracelet. Keep the tube clamped at all times, unless it is being used. Always carry a smooth-edge clamp with you to clamp the PICC if it breaks. Do not use scissors or sharp objects near the tube. You may bend your arm and move it freely. If your PICC is near or at the bend of your elbow, avoid activity with repeated motion at the elbow. Avoid lifting heavy objects as told by your health care provider. Keep all follow-up visits. This is important. You will need to have your PICC dressing changed at least once a week. Contact a health care provider if: You have pain in your arm, ear, face, or teeth. You have a fever or chills. You have redness, swelling, or pain around the insertion site. You have fluid or blood coming from the insertion site. Your insertion site feels warm to the touch. You have pus or a bad smell coming from the insertion site. Your skin feels hard and raised around the insertion site. Your PICC dressing has gotten wet or is coming off and you have not been taught how to change it. Get help right away if: You have problems with your PICC, such as  your PICC: Was tugged or pulled and has partially come out. Do not  push the PICC back in. Cannot be flushed, is hard to flush, or leaks around the insertion site when it is flushed. Makes a flushing sound when it is flushed. Appears to have a hole or tear. Is accidentally pulled all the way out. If this happens, cover the insertion site with a gauze dressing. Do not throw the PICC away. Your health care provider will need to check it to be sure the entire catheter came out. You feel your heart racing or skipping beats, or you have chest pain. You have shortness of breath or trouble breathing. You have swelling, redness, warmth, or pain in the arm in which the PICC is placed. You have a red streak going up your arm that  starts under the PICC dressing. These symptoms may be an emergency. Get help right away. Call 911. Do not wait to see if the symptoms will go away. Do not drive yourself to the hospital. Summary A peripherally inserted central catheter (PICC) is a long, thin, flexible tube (catheter) that is put into a vein in the arm or leg. If cared for properly, a PICC can remain in place for many months. Having a PICC can allow you to go home from the hospital sooner and continue treatment at home. The PICC is inserted using a germ-free (sterile) technique by a specially trained health care provider. Only a trained health care provider should remove it. Do not have your blood pressure checked on the arm in which your PICC is placed. Always keep your PICC identification card with you. This information is not intended to replace advice given to you by your health care provider. Make sure you discuss any questions you have with your health care provider. Document Revised: 12/10/2020 Document Reviewed: 12/10/2020 Elsevier Patient Education  2024 [employer].

## 2023-04-06 ENCOUNTER — Encounter: Payer: Self-pay | Admitting: Gastroenterology

## 2023-04-06 ENCOUNTER — Other Ambulatory Visit (HOSPITAL_BASED_OUTPATIENT_CLINIC_OR_DEPARTMENT_OTHER): Payer: Self-pay

## 2023-04-06 ENCOUNTER — Other Ambulatory Visit: Payer: Self-pay | Admitting: Hematology & Oncology

## 2023-04-06 ENCOUNTER — Ambulatory Visit: Payer: Medicare HMO | Admitting: Gastroenterology

## 2023-04-06 VITALS — BP 149/87 | HR 79 | Temp 98.4°F | Resp 10

## 2023-04-06 DIAGNOSIS — K2940 Chronic atrophic gastritis without bleeding: Secondary | ICD-10-CM | POA: Diagnosis not present

## 2023-04-06 DIAGNOSIS — C169 Malignant neoplasm of stomach, unspecified: Secondary | ICD-10-CM

## 2023-04-06 DIAGNOSIS — C160 Malignant neoplasm of cardia: Secondary | ICD-10-CM

## 2023-04-06 DIAGNOSIS — C162 Malignant neoplasm of body of stomach: Secondary | ICD-10-CM | POA: Diagnosis not present

## 2023-04-06 DIAGNOSIS — R1310 Dysphagia, unspecified: Secondary | ICD-10-CM | POA: Diagnosis not present

## 2023-04-06 DIAGNOSIS — C163 Malignant neoplasm of pyloric antrum: Secondary | ICD-10-CM | POA: Diagnosis not present

## 2023-04-06 MED ORDER — SODIUM CHLORIDE 0.9 % IV SOLN: 500.0000 mL | INTRAVENOUS | Status: DC

## 2023-04-06 MED ORDER — TEMAZEPAM 7.5 MG PO CAPS: 7.5000 mg | ORAL_CAPSULE | Freq: Every evening | ORAL | 0 refills | Status: DC | PRN

## 2023-04-06 NOTE — Op Note (Signed)
[facility] Patient Name: George Dyer Procedure Date: 04/06/2023 9:20 AM MRN: [id number] Endoscopist: Lynann Bologna , MD, [id number] Age: 82 Referring MD:  Date of Birth: 1940/08/07 Gender: Male Account #: [id number] Procedure:                Upper GI endoscopy Indications:              1. FU gastric Adenoca (Dx 08/2021) s/p chemo-XRT.                            Refused Sx. 2. Dysphagia Medicines:                Monitored Anesthesia Care Procedure:                Pre-Anesthesia Assessment:                           - Prior to the procedure, a History and Physical                            was performed, and patient medications and                            allergies were reviewed. The patient's tolerance of                            previous anesthesia was also reviewed. The risks                            and benefits of the procedure and the sedation                            options and risks were discussed with the patient.                            All questions were answered, and informed consent                            was obtained. Prior Anticoagulants: The patient has                            taken no anticoagulant or antiplatelet agents. ASA                            Grade Assessment: III - A patient with severe                            systemic disease. After reviewing the risks and                            benefits, the patient was deemed in satisfactory                            condition to undergo the procedure.  After obtaining informed consent, the endoscope was                            passed under direct vision. Throughout the                            procedure, the patient's blood pressure, pulse, and                            oxygen saturations were monitored continuously. The                            Olympus Scope SN [id number] was introduced through the                            mouth, and advanced to  the second part of duodenum.                            The upper GI endoscopy was accomplished without                            difficulty. The patient tolerated the procedure                            well. Scope In: Scope Out: Findings:                 Small grade 1 esophageal varices were again noted.                           One benign-appearing, intrinsic moderate                            (circumferential scarring or stenosis; an endoscope                            may pass) stenosis was found 36 cm from the                            incisors at [employer]. This stenosis measured 1 cm                            (inner diameter) x less than one cm (in length).                            The stenosis was traversed. A TTS dilator was                            passed through the scope. Dilation with a                            12-13.5-15 mm balloon dilator was performed to 15  mm. The dilation site was examined and showed                            moderate mucosal disruption and moderate                            improvement.                           A medium-sized 6 cm x 5 cm, infiltrative, polypoid                            mass with superficial ulcerations involving one                            third of the circumference was noted in the cardia.                            XRT changes also noted. This did not directly                            involve GE junction but came within 2 cm of GE                            junction. This was firm-hard, still friable.                            Biopsies were taken with a cold forceps for                            histology.                           Diffuse mild inflammation characterized by erythema                            was found in the entire examined stomach. Biopsies                            were taken with a cold forceps for histology from                            antrum and body of the  stomach.                           The examined duodenum was normal.                           The exam was otherwise without abnormality. Complications:            No immediate complications. Estimated Blood Loss:     Estimated blood loss: none. Impression:               - Benign-appearing esophageal stenosis. Dilated.                           -  Malignant gastric tumor in the cardia. Biopsied.                            Unfortunately, it appears to have enlarged as                            compared to previous EGD.                           - Atrophic gastritis. Biopsied.                           - The examination was otherwise normal. Recommendation:           - Patient has a contact number available for                            emergencies. The signs and symptoms of potential                            delayed complications were discussed with the                            patient. Return to normal activities tomorrow.                            Written discharge instructions were provided to the                            patient.                           - Post dil diet.                           - Continue present medications including omeprazole                            40mg  po QD.                           - Await pathology results.                           - Will get in touch with Dr Myna Hidalgo.                           - The findings and recommendations were discussed                            with the patient's family. Lynann Bologna, MD 04/06/2023 10:07:04 AM This report has been signed electronically.

## 2023-04-06 NOTE — Telephone Encounter (Signed)
Last refilled 03/11/23 #30 with 0 refills. Please advise, thanks.

## 2023-04-06 NOTE — Progress Notes (Signed)
Vss nad trans to pacu 

## 2023-04-06 NOTE — Progress Notes (Signed)
Called to room to assist during endoscopic procedure.  Patient ID and intended procedure confirmed with present staff. Received instructions for my participation in the procedure from the performing physician.  

## 2023-04-06 NOTE — Patient Instructions (Signed)
Please read handouts provided. Continue present medications, including omeprazole 40 mg everyday. Await pathology results. Dilation Diet.   YOU HAD AN ENDOSCOPIC PROCEDURE TODAY AT THE  ENDOSCOPY CENTER:   Refer to the procedure report that was given to you for any specific questions about what was found during the examination.  If the procedure report does not answer your questions, please call your gastroenterologist to clarify.  If you requested that your care partner not be given the details of your procedure findings, then the procedure report has been included in a sealed envelope for you to review at your convenience later.  YOU SHOULD EXPECT: Some feelings of bloating in the abdomen. Passage of more gas than usual.  Walking can help get rid of the air that was put into your GI tract during the procedure and reduce the bloating. If you had a lower endoscopy (such as a colonoscopy or flexible sigmoidoscopy) you may notice spotting of blood in your stool or on the toilet paper. If you underwent a bowel prep for your procedure, you may not have a normal bowel movement for a few days.  Please Note:  You might notice some irritation and congestion in your nose or some drainage.  This is from the oxygen used during your procedure.  There is no need for concern and it should clear up in a day or so.  SYMPTOMS TO REPORT IMMEDIATELY:   Following upper endoscopy (EGD)  Vomiting of blood or coffee ground material  New chest pain or pain under the shoulder blades  Painful or persistently difficult swallowing  New shortness of breath  Fever of 100F or higher  Black, tarry-looking stools  For urgent or emergent issues, a gastroenterologist can be reached at any hour by calling [phone]. Do not use MyChart messaging for urgent concerns.    DIET:  We do recommend a small meal at first, but then you may proceed to your regular diet.  Drink plenty of fluids but you should avoid  alcoholic beverages for 24 hours.  ACTIVITY:  You should plan to take it easy for the rest of today and you should NOT DRIVE or use heavy machinery until tomorrow (because of the sedation medicines used during the test).    FOLLOW UP: Our staff will call the number listed on your records the next business day following your procedure.  We will call around 7:15- 8:00 am to check on you and address any questions or concerns that you may have regarding the information given to you following your procedure. If we do not reach you, we will leave a message.     If any biopsies were taken you will be contacted by phone or by letter within the next 1-3 weeks.  Please call us at [phone] if you have not heard about the biopsies in 3 weeks.    SIGNATURES/CONFIDENTIALITY: You and/or your care partner have signed paperwork which will be entered into your electronic medical record.  These signatures attest to the fact that that the information above on your After Visit Summary has been reviewed and is understood.  Full responsibility of the confidentiality of this discharge information lies with you and/or your care-partner.

## 2023-04-07 ENCOUNTER — Other Ambulatory Visit: Payer: Self-pay | Admitting: *Deleted

## 2023-04-07 ENCOUNTER — Encounter: Payer: Self-pay | Admitting: Hematology & Oncology

## 2023-04-07 ENCOUNTER — Telehealth: Payer: Self-pay | Admitting: *Deleted

## 2023-04-07 DIAGNOSIS — C160 Malignant neoplasm of cardia: Secondary | ICD-10-CM

## 2023-04-07 MED ORDER — DEXAMETHASONE 4 MG PO TABS: 8.0000 mg | ORAL_TABLET | Freq: Every day | ORAL | 5 refills | Status: DC

## 2023-04-07 MED ORDER — PROCHLORPERAZINE MALEATE 10 MG PO TABS: 10.0000 mg | ORAL_TABLET | Freq: Four times a day (QID) | ORAL | 1 refills | Status: DC | PRN

## 2023-04-07 MED ORDER — ONDANSETRON HCL 8 MG PO TABS: 8.0000 mg | ORAL_TABLET | Freq: Three times a day (TID) | ORAL | 1 refills | Status: DC | PRN

## 2023-04-07 NOTE — Telephone Encounter (Signed)
  Follow up Call-     04/06/2023    8:48 AM 03/16/2022    1:49 PM 08/06/2021   12:43 PM  Call back number  Post procedure Call Back phone  # [phone]  Permission to leave phone message Yes Yes Yes    Spoke with wife, pt asleep Patient questions:  Do you have a fever, pain , or abdominal swelling? No. Pain Score  0 *  Have you tolerated food without any problems? Yes.    Have you been able to return to your normal activities? Yes.    Do you have any questions about your discharge instructions: Diet   No. Medications  No. Follow up visit  No.  Do you have questions or concerns about your Care? No.  Actions: * If pain score is 4 or above: No action needed, pain <4.

## 2023-04-08 ENCOUNTER — Telehealth: Payer: Self-pay | Admitting: Gastroenterology

## 2023-04-08 LAB — SURGICAL PATHOLOGY

## 2023-04-08 NOTE — Telephone Encounter (Signed)
Inbound call from [facility] stating Dr. Kenyon Ana is wishing to discuss recent results for patient. Requesting a follow up call at [phone]. Please advise, thank you.

## 2023-04-08 NOTE — Telephone Encounter (Signed)
Returned call to [facility] & spoke with Dr. Kenyon Ana. He wanted to make sure Dr. Chales Abrahams is aware of path results per protocol. Results show poorly differentiated adenocarcinoma. He stated path report should be available soon for MD to review since he has signed off.

## 2023-04-08 NOTE — Telephone Encounter (Signed)
I received notification of the patient's pathology results on behalf of Dr. Chales Abrahams while he is off for the day. I spoke to the patient's wife (her phone number is listed as one of the patient's primary contact numbers, and there is a note in his chart stating that it would be okay to communicate information with the patient's wife) about the results of his biopsies. The gastric cardia mass came back with gastric adenocarcinoma, which he is being treated for. The rest of the stomach biopsies showed inflammation. The patient's wife stated that she was already aware of this because she was told after his recent EGD of these findings. Will CC Dr. Chales Abrahams and Dr. Myna Hidalgo this telephone note.

## 2023-04-11 ENCOUNTER — Encounter: Payer: Self-pay | Admitting: Hematology & Oncology

## 2023-04-11 ENCOUNTER — Inpatient Hospital Stay: Payer: Medicare HMO | Attending: Hematology & Oncology | Admitting: Hematology & Oncology

## 2023-04-11 ENCOUNTER — Other Ambulatory Visit: Payer: Self-pay

## 2023-04-11 VITALS — BP 140/59 | HR 73 | Temp 98.9°F | Resp 19 | Ht 69.0 in | Wt 145.0 lb

## 2023-04-11 DIAGNOSIS — Z452 Encounter for adjustment and management of vascular access device: Secondary | ICD-10-CM | POA: Diagnosis not present

## 2023-04-11 DIAGNOSIS — Z5112 Encounter for antineoplastic immunotherapy: Secondary | ICD-10-CM | POA: Diagnosis not present

## 2023-04-11 DIAGNOSIS — Z79899 Other long term (current) drug therapy: Secondary | ICD-10-CM | POA: Insufficient documentation

## 2023-04-11 DIAGNOSIS — Z5189 Encounter for other specified aftercare: Secondary | ICD-10-CM | POA: Insufficient documentation

## 2023-04-11 DIAGNOSIS — C160 Malignant neoplasm of cardia: Secondary | ICD-10-CM | POA: Diagnosis not present

## 2023-04-11 DIAGNOSIS — C169 Malignant neoplasm of stomach, unspecified: Secondary | ICD-10-CM | POA: Diagnosis not present

## 2023-04-11 NOTE — Progress Notes (Signed)
Hematology and Oncology Follow Up Visit  George Dyer [id number] 04-23-1941 82 y.o. 04/11/2023   Principle Diagnosis:  Stage IIB (T3N0M0) Gastric cancer -- declines surgery -recurrent-  HER2(-)/ BRAF (+)   Current Therapy:        FOLFOX - s/p cycle #4-  start on 09/15/2021 XRT/Xeloda -- start on 12/07/2021 --completed on 01/12/2022 FOLFIRI/Opdivo -- st/p cycle #1 on 10/19/2022 -- d/c FOLFIRI on 11/02/2022 --changed Opdivo to 21-day cycles on 12/22/2022   Interim History:  George Dyer is here today with George Dyer wife for follow-up.  George Dyer did have George Dyer upper endoscopy.  When we last saw George Dyer, George Dyer was complaining a lot of nausea and vomiting.  George Dyer said George Dyer had hard time swallowing.  George Dyer underwent the upper endoscopy on 04/06/2023.  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  However, George Dyer also has had the gastric mass.  It measured 6 x 5 cm.  This may be a little bit larger than previously.  It did not involve the GE junction.  Biopsies were taken.  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  Again, George Dyer talked to the patient about this.  George Dyer would have to say that the tumor is probably relatively stable.  George Dyer was worried that it was causing gastric outlet obstruction.  It does not look like that it is.  George Dyer got some ivermectin from some friend.  George Dyer wants to take this.  George Dyer told George Dyer that this is certainly not FDA approved for stomach cancer.  However, if George Dyer wants to take this, George Dyer certainly would not have any arguments against this.  However, George Dyer told George Dyer that George Dyer is going to have to talk to the Integrative physician in [place] as far as dosing.  George Dyer is on immunotherapy.  George Dyer think George Dyer is doing fairly well with this.  George Dyer has had no problems with pain although George Dyer does have the chronic pain in George Dyer legs from neuropathy.  Again George Dyer is eating better.  Swallowing better.  George Dyer is gained a little bit of weight.  George Dyer does have the PICC line in.  This is  coming a very handy.  George Dyer last TSH that was done on 03/22/2023 was 3.1.  Currently, George Dyer would have to say that George Dyer performance status is ECOG 1.   Medications:  Allergies as of 04/11/2023       Reactions   Percocet [oxycodone-acetaminophen] Nausea And Vomiting   Oxycodone Other (See Comments)   Doesn't remember   Oxycodone-aspirin Nausea And Vomiting        Medication List        Accurate as of April 11, 2023  3:32 PM. If you have any questions, ask your nurse or doctor.          acetaminophen 325 MG tablet Commonly known as: TYLENOL Take 650 mg by mouth 2 (two) times daily as needed.   alum & mag hydroxide-simeth 200-200-20 MG/5ML suspension Commonly known as: MAALOX/MYLANTA Take 30 mLs by mouth daily.   dexamethasone 4 MG tablet Commonly known as: DECADRON Take 2 tablets (8 mg total) by mouth daily. Start the day after chemotherapy for 2 days. Take with food.   doxylamine (Sleep) 25 MG tablet Commonly known as: UNISOM Take 25 mg by mouth at bedtime as needed.   dronabinol 2.5 MG capsule Commonly known as: MARINOL Take 1 capsule (2.5 mg total) by mouth 2 (two) times daily before a meal.   DULoxetine  30 MG capsule Commonly known as: Cymbalta Take 2 capsules (60 mg total) by mouth at bedtime. Take 1 capsule (30 mg) for the 1st week, then increase to 2 capsules thereafter.   Heparin Na (Pork) Lock Flsh PF 100 UNIT/ML Soln Flush each PICC lumen with 2.5 mLs (250 Units total) as directed every Monday, Wednesday, and Friday.   lactulose 10 GM/15ML solution Commonly known as: CHRONULAC Take 30 mLs (20 g total) by mouth 3 (three) times daily.   levothyroxine 75 MCG tablet Commonly known as: SYNTHROID Take 75 mcg by mouth daily.   loperamide 2 MG capsule Commonly known as: IMODIUM Take 2 tabs by mouth with first loose stool, then 1 tab with each additional loose stool as needed. Do not exceed 8 tabs in a 24-hour period   megestrol 40 MG/ML  suspension Commonly known as: MEGACE TAKE 10 MLS (400 MG TOTAL) BY MOUTH 2 (TWO) TIMES DAILY.   megestrol 625 MG/5ML suspension Commonly known as: MEGACE ES Take 5 mLs (625 mg total) by mouth daily.   Normal Saline Flush 0.9 % Soln Inject 10 mLs into each port 3 (three) times a week. Flush each port of PICC Monday, Wednesday and Friday.   omeprazole 40 MG capsule Commonly known as: PRILOSEC TAKE 1 CAPSULE (40 MG TOTAL) BY MOUTH DAILY.   ondansetron 8 MG tablet Commonly known as: ZOFRAN Take 1 tablet (8 mg total) by mouth every 8 (eight) hours as needed for nausea, vomiting or refractory nausea / vomiting. Start on the third day after chemotherapy.   polyethylene glycol powder 17 GM/SCOOP powder Commonly known as: GlycoLax Take 17 g by mouth 2 (two) times daily.   prochlorperazine 10 MG tablet Commonly known as: COMPAZINE Take 1 tablet (10 mg total) by mouth every 6 (six) hours as needed for nausea or vomiting.   temazepam 7.5 MG capsule Commonly known as: RESTORIL Take 1 capsule (7.5 mg total) by mouth at bedtime as needed for sleep.   traMADol 50 MG tablet Commonly known as: ULTRAM Take by mouth.        Allergies:  Allergies  Allergen Reactions   Percocet [Oxycodone-Acetaminophen] Nausea And Vomiting   Oxycodone Other (See Comments)    Doesn't remember   Oxycodone-Aspirin Nausea And Vomiting    Past Medical History, Surgical history, Social history, and Family History were reviewed and updated.  Review of Systems: Review of Systems  Constitutional: Negative.   HENT: Negative.    Eyes: Negative.   Respiratory: Negative.    Cardiovascular: Negative.   Gastrointestinal:  Positive for nausea and vomiting.  Genitourinary: Negative.   Musculoskeletal: Negative.   Skin: Negative.   Neurological: Negative.   Endo/Heme/Allergies: Negative.   Psychiatric/Behavioral: Negative.     Marland Kitchen   Physical Exam: Vital signs show temperature of 98.9.  Pulse 73.  Blood  pressure 140/59.  Weight is 145 pounds.  Wt Readings from Last 3 Encounters:  03/29/23 145 lb (65.8 kg)  03/22/23 143 lb (64.9 kg)  03/15/23 146 lb (66.2 kg)    Physical Exam Vitals reviewed.  HENT:     Head: Normocephalic and atraumatic.  Eyes:     Pupils: Pupils are equal, round, and reactive to light.  Cardiovascular:     Rate and Rhythm: Normal rate and regular rhythm.     Heart sounds: Normal heart sounds.  Pulmonary:     Effort: Pulmonary effort is normal.     Breath sounds: Normal breath sounds.  Abdominal:     General:  Bowel sounds are normal.     Palpations: Abdomen is soft.  Musculoskeletal:        General: No tenderness or deformity. Normal range of motion.     Cervical back: Normal range of motion.  Lymphadenopathy:     Cervical: No cervical adenopathy.  Skin:    General: Skin is warm and dry.     Findings: No erythema or rash.  Neurological:     Mental Status: George Dyer is alert and oriented to person, place, and time.  Psychiatric:        Behavior: Behavior normal.        Thought Content: Thought content normal.        Judgment: Judgment normal.     Lab Results  Component Value Date   WBC 6.2 03/22/2023   HGB 12.2 (L) 03/22/2023   HCT 38.0 (L) 03/22/2023   MCV 89.0 03/22/2023   PLT 300 03/22/2023   Lab Results  Component Value Date   FERRITIN 74 10/04/2022   IRON 93 10/04/2022   TIBC 279 10/04/2022   UIBC 186 10/04/2022   IRONPCTSAT 33 10/04/2022   Lab Results  Component Value Date   RETICCTPCT 0.7 08/18/2021   RBC 4.27 03/22/2023   No results found for: "KPAFRELGTCHN", "LAMBDASER", "KAPLAMBRATIO" Lab Results  Component Value Date   IGGSERUM 931 05/05/2022   IGMSERUM 59 05/05/2022   No results found for: "TOTALPROTELP", "ALBUMINELP", "A1GS", "A2GS", "BETS", "BETA2SER", "GAMS", "MSPIKE", "SPEI"   Chemistry      Component Value Date/Time   NA 138 03/22/2023 1118   NA 139 07/23/2020 0818   K 4.1 03/22/2023 1118   CL 102 03/22/2023 1118    CO2 30 03/22/2023 1118   BUN 18 03/22/2023 1118   BUN 10 07/23/2020 0818   CREATININE 0.99 03/22/2023 1118   CREATININE 1.06 06/27/2015 0837      Component Value Date/Time   CALCIUM 9.0 03/22/2023 1118   ALKPHOS 79 03/22/2023 1118   AST 13 (L) 03/22/2023 1118   ALT 9 03/22/2023 1118   BILITOT 0.5 03/22/2023 1118       Impression and Plan: George Dyer is a very pleasant George Dyer with localized gastric cancer. So far, George Dyer has been very reluctant to undergo surgery for treatment of this localized disease.  George Dyer am glad that we did get the endoscopy on George Dyer.  Again, looks like overall, the tumor is stable to maybe slightly increased in size.  Again, it is not causing any blockages for George Dyer.  If you want to try the ivermectin, again George Dyer have no problems with this.  George Dyer told that there really is no information about how it works for stomach cancer.  However, George Dyer was told that ivermectin will help with cancer.  Again, George Dyer told George Dyer that George Dyer must talk to the Integrative doctor in [place] as to dosing.  George Dyer will continue George Dyer immunotherapy.  George Dyer will plan to get George Dyer back to see me for George Dyer next cycle. Marland Kitchen   Josph Macho, MD 11/4/20243:32 PM

## 2023-04-12 NOTE — Telephone Encounter (Signed)
Thanks a lot Alan Ripper Apologize for double work.  I had already talked to patient's wife RG

## 2023-04-13 ENCOUNTER — Inpatient Hospital Stay: Payer: Medicare HMO

## 2023-04-13 ENCOUNTER — Other Ambulatory Visit (HOSPITAL_BASED_OUTPATIENT_CLINIC_OR_DEPARTMENT_OTHER): Payer: Self-pay

## 2023-04-13 ENCOUNTER — Ambulatory Visit: Payer: Medicare HMO | Admitting: Medical Oncology

## 2023-04-13 DIAGNOSIS — C160 Malignant neoplasm of cardia: Secondary | ICD-10-CM

## 2023-04-13 DIAGNOSIS — Z5112 Encounter for antineoplastic immunotherapy: Secondary | ICD-10-CM | POA: Diagnosis not present

## 2023-04-13 DIAGNOSIS — Z79899 Other long term (current) drug therapy: Secondary | ICD-10-CM | POA: Diagnosis not present

## 2023-04-13 DIAGNOSIS — Z5189 Encounter for other specified aftercare: Secondary | ICD-10-CM | POA: Diagnosis not present

## 2023-04-13 DIAGNOSIS — Z452 Encounter for adjustment and management of vascular access device: Secondary | ICD-10-CM | POA: Diagnosis not present

## 2023-04-13 DIAGNOSIS — C169 Malignant neoplasm of stomach, unspecified: Secondary | ICD-10-CM | POA: Diagnosis not present

## 2023-04-13 LAB — CMP (CANCER CENTER ONLY)
ALT: 11 U/L (ref 0–44)
AST: 14 U/L — ABNORMAL LOW (ref 15–41)
Albumin: 4.1 g/dL (ref 3.5–5.0)
Alkaline Phosphatase: 80 U/L (ref 38–126)
Anion gap: 5 (ref 5–15)
BUN: 17 mg/dL (ref 8–23)
CO2: 30 mmol/L (ref 22–32)
Calcium: 9.3 mg/dL (ref 8.9–10.3)
Chloride: 103 mmol/L (ref 98–111)
Creatinine: 0.92 mg/dL (ref 0.61–1.24)
GFR, Estimated: >60 mL/min (ref >60)
Glucose, Bld: 98 mg/dL (ref 70–99)
Potassium: 4.2 mmol/L (ref 3.5–5.1)
Sodium: 138 mmol/L (ref 135–145)
Total Bilirubin: 0.6 mg/dL (ref <1.2)
Total Protein: 6.5 g/dL (ref 6.5–8.1)

## 2023-04-13 LAB — CBC WITH DIFFERENTIAL (CANCER CENTER ONLY)
Abs Immature Granulocytes: 0.01 10*3/uL (ref 0.00–0.07)
Basophils Absolute: 0.1 10*3/uL (ref 0.0–0.1)
Basophils Relative: 2 %
Eosinophils Absolute: 0.4 10*3/uL (ref 0.0–0.5)
Eosinophils Relative: 7 %
HCT: 37.8 % — ABNORMAL LOW (ref 39.0–52.0)
Hemoglobin: 12.1 g/dL — ABNORMAL LOW (ref 13.0–17.0)
Immature Granulocytes: 0 %
Lymphocytes Relative: 21 %
Lymphs Abs: 1.2 10*3/uL (ref 0.7–4.0)
MCH: 28.7 pg (ref 26.0–34.0)
MCHC: 32 g/dL (ref 30.0–36.0)
MCV: 89.6 fL (ref 80.0–100.0)
Monocytes Absolute: 0.7 10*3/uL (ref 0.1–1.0)
Monocytes Relative: 12 %
Neutro Abs: 3.2 10*3/uL (ref 1.7–7.7)
Neutrophils Relative %: 58 %
Platelet Count: 299 10*3/uL (ref 150–400)
RBC: 4.22 MIL/uL (ref 4.22–5.81)
RDW: 13.8 % (ref 11.5–15.5)
WBC Count: 5.5 10*3/uL (ref 4.0–10.5)
nRBC: 0 % (ref 0.0–0.2)

## 2023-04-13 MED ORDER — SODIUM CHLORIDE 0.9 % IV SOLN
360.0000 mg | INTRAVENOUS | Status: DC
  Administered 2023-04-13: 360 mg via INTRAVENOUS
  Filled 2023-04-13: qty 24

## 2023-04-13 MED ORDER — HEPARIN SOD (PORK) LOCK FLUSH 100 UNIT/ML IV SOLN: 500.0000 [IU] | Freq: Once | INTRAVENOUS | Status: DC | PRN

## 2023-04-13 MED ORDER — SODIUM CHLORIDE 0.9 % IV SOLN: Freq: Once | INTRAVENOUS | Status: AC

## 2023-04-13 MED ORDER — HYDROMORPHONE HCL 1 MG/ML IJ SOLN
1.0000 mg | Freq: Once | INTRAMUSCULAR | Status: AC
  Filled 2023-04-13: qty 1

## 2023-04-13 MED ORDER — SODIUM CHLORIDE 0.9% FLUSH
10.0000 mL | INTRAVENOUS | Status: DC | PRN
Start: 2023-04-13 — End: 2023-07-06

## 2023-04-13 NOTE — Progress Notes (Unsigned)
Patient complains of bilateral chronic leg and foot pain. Patient reports the pain is worse with long periods of sitting. Patient reports the pain a 8 on the 0 to 10 pain scale. Dr. Myna Hidalgo notified. 1 mg Dilaudid IVP  ordered per Dr. Myna Hidalgo.

## 2023-04-13 NOTE — Patient Instructions (Signed)
[facility] - A DEPT OF [facility]  Discharge Instructions: Thank you for choosing [facility] to provide your oncology and hematology care.   If you have a lab appointment with the Cancer Center, please go directly to the Cancer Center and check in at the registration area.  Wear comfortable clothing and clothing appropriate for easy access to any Portacath or PICC line.   We strive to give you quality time with your provider. You may need to reschedule your appointment if you arrive late (15 or more minutes).  Arriving late affects you and other patients whose appointments are after yours.  Also, if you miss three or more appointments without notifying the office, you may be dismissed from the clinic at the provider's discretion.      For prescription refill requests, have your pharmacy contact our office and allow 72 hours for refills to be completed.    Today you received the following chemotherapy and/or immunotherapy agents Opdivo      To help prevent nausea and vomiting after your treatment, we encourage you to take your nausea medication as directed.  BELOW ARE SYMPTOMS THAT SHOULD BE REPORTED IMMEDIATELY: *FEVER GREATER THAN 100.4 F (38 C) OR HIGHER *CHILLS OR SWEATING *NAUSEA AND VOMITING THAT IS NOT CONTROLLED WITH YOUR NAUSEA MEDICATION *UNUSUAL SHORTNESS OF BREATH *UNUSUAL BRUISING OR BLEEDING *URINARY PROBLEMS (pain or burning when urinating, or frequent urination) *BOWEL PROBLEMS (unusual diarrhea, constipation, pain near the anus) TENDERNESS IN MOUTH AND THROAT WITH OR WITHOUT PRESENCE OF ULCERS (sore throat, sores in mouth, or a toothache) UNUSUAL RASH, SWELLING OR PAIN  UNUSUAL VAGINAL DISCHARGE OR ITCHING   Items with * indicate a potential emergency and should be followed up as soon as possible or go to the Emergency Department if any problems should occur.  Please show the CHEMOTHERAPY ALERT CARD or IMMUNOTHERAPY  ALERT CARD at check-in to the Emergency Department and triage nurse. Should you have questions after your visit or need to cancel or reschedule your appointment, please contact [facility] - A DEPT OF [facility]  [phone] and follow the prompts.  Office hours are 8:00 a.m. to 4:30 p.m. Monday - Friday. Please note that voicemails left after 4:00 p.m. may not be returned until the following business day.  We are closed weekends and major holidays. You have access to a nurse at all times for urgent questions. Please call the main number to the clinic [phone] and follow the prompts.  For any non-urgent questions, you may also contact your provider using MyChart. We now offer e-Visits for anyone 77 and older to request care online for non-urgent symptoms. For details visit [email or web address].   Also download the MyChart app! Go to the app store, search "MyChart", open the app, select [facility], and log in with your MyChart username and password.

## 2023-04-13 NOTE — Patient Instructions (Signed)
PICC Home Care Guide A peripherally inserted central catheter (PICC) is a form of IV access that allows medicines and IV fluids to be quickly put into the blood and spread throughout the body. The PICC is a long, thin, flexible tube (catheter) that is put into a vein in a person's arm or leg. The catheter ends in a large vein just outside the heart called the superior vena cava (SVC). After the PICC is put in, a chest X-ray may be done to make sure that it is in the right place. A PICC may be placed for different reasons, such as: To give medicines and liquid nutrition. To give IV fluids and blood products. To take blood samples often. If there is trouble placing a peripheral intravenous (PIV) catheter. If cared for properly, a PICC can remain in place for many months. Having a PICC can allow you to go home from the hospital sooner and continue treatment at home. Medicines and PICC care can be managed at home by a family member, caregiver, or home health care team. What are the risks? Generally, having a PICC is safe. However, problems may occur, including: A blood clot (thrombus) forming in or at the end of the PICC. A blood clot forming in a vein (deep vein thrombosis) or traveling to the lung (pulmonary embolism). Inflammation of the vein (phlebitis) in which the PICC is placed. Infection at the insertion site or in the blood. Blood infections from central lines, like PICCs, can be serious and often require a hospital stay. PICC malposition, or PICC movement or poor placement. A break or cut in the PICC. Do not use scissors near the PICC. Nerve or tendon irritation or injury during PICC insertion. How to care for your PICC Please follow the specific guidelines provided by your health care provider. Preventing infection You and any caregivers should wash your hands often with soap and water for at least 20 seconds. Wash hands: Before touching the PICC or the infusion device. Before changing a  bandage (dressing). Do not change the dressing unless you have been taught to do so and have shown you are able to change it safely. Flush the PICC as told. Tell your health care provider right away if the PICC is hard to flush or does not flush. Do not use force to flush the PICC. Use clean and germ-free (sterile) supplies only. Keep the supplies in a dry place. Do not reuse needles, syringes, or any other supplies. Reusing supplies can lead to infection. Keep the PICC dressing dry and secure it with tape if the edges stop sticking to your skin. Check your PICC insertion site every day for signs of infection. Check for: Redness, swelling, or pain. Fluid or blood. Warmth. Pus or a bad smell. Preventing other problems Do not use a syringe that is less than 10 mL to flush the PICC. Do not have your blood pressure checked on the arm in which the PICC is placed. Do not ever pull or tug on the PICC. Keep it secured to your arm with tape or a stretch wrap when not in use. Do not take the PICC out yourself. Only a trained health care provider should remove the PICC. Keep pets and children away from your PICC. How to care for your PICC dressing Keep your PICC dressing clean and dry to prevent infection. Do not take baths, swim, or use a hot tub until your health care provider approves. Ask your health care provider if you can take  showers. You may only be allowed to take sponge baths. When you are allowed to shower: Ask your health care provider to teach you how to wrap the PICC. Cover the PICC with clear plastic wrap and tape to keep it dry while showering. Follow instructions from your health care provider about how to take care of your insertion site and dressing. Make sure you: Wash your hands with soap and water for at least 20 seconds before and after you change your dressing. If soap and water are not available, use hand sanitizer. Change your dressing only if taught to do so by your health care  provider. Your PICC dressing needs to be changed if it becomes loose or wet. Leave stitches (sutures), skin glue, or adhesive strips in place. These skin closures may need to stay in place for 2 weeks or longer. If adhesive strip edges start to loosen and curl up, you may trim the loose edges. Do not remove adhesive strips completely unless your health care provider tells you to do that. Follow these instructions at home: Disposal of supplies Throw away any syringes in a disposal container that is meant for sharp items (sharps container). You can buy a sharps container from a pharmacy, or you can make one by using an empty, hard plastic bottle with a lid. Place any used dressings or infusion bags into a plastic bag. Throw that bag in the trash. General instructions  Always carry your PICC identification card or wear a medical alert bracelet. Keep the tube clamped at all times, unless it is being used. Always carry a smooth-edge clamp with you to clamp the PICC if it breaks. Do not use scissors or sharp objects near the tube. You may bend your arm and move it freely. If your PICC is near or at the bend of your elbow, avoid activity with repeated motion at the elbow. Avoid lifting heavy objects as told by your health care provider. Keep all follow-up visits. This is important. You will need to have your PICC dressing changed at least once a week. Contact a health care provider if: You have pain in your arm, ear, face, or teeth. You have a fever or chills. You have redness, swelling, or pain around the insertion site. You have fluid or blood coming from the insertion site. Your insertion site feels warm to the touch. You have pus or a bad smell coming from the insertion site. Your skin feels hard and raised around the insertion site. Your PICC dressing has gotten wet or is coming off and you have not been taught how to change it. Get help right away if: You have problems with your PICC, such as  your PICC: Was tugged or pulled and has partially come out. Do not  push the PICC back in. Cannot be flushed, is hard to flush, or leaks around the insertion site when it is flushed. Makes a flushing sound when it is flushed. Appears to have a hole or tear. Is accidentally pulled all the way out. If this happens, cover the insertion site with a gauze dressing. Do not throw the PICC away. Your health care provider will need to check it to be sure the entire catheter came out. You feel your heart racing or skipping beats, or you have chest pain. You have shortness of breath or trouble breathing. You have swelling, redness, warmth, or pain in the arm in which the PICC is placed. You have a red streak going up your arm that  starts under the PICC dressing. These symptoms may be an emergency. Get help right away. Call 911. Do not wait to see if the symptoms will go away. Do not drive yourself to the hospital. Summary A peripherally inserted central catheter (PICC) is a long, thin, flexible tube (catheter) that is put into a vein in the arm or leg. If cared for properly, a PICC can remain in place for many months. Having a PICC can allow you to go home from the hospital sooner and continue treatment at home. The PICC is inserted using a germ-free (sterile) technique by a specially trained health care provider. Only a trained health care provider should remove it. Do not have your blood pressure checked on the arm in which your PICC is placed. Always keep your PICC identification card with you. This information is not intended to replace advice given to you by your health care provider. Make sure you discuss any questions you have with your health care provider. Document Revised: 12/10/2020 Document Reviewed: 12/10/2020 Elsevier Patient Education  2024 [employer].

## 2023-04-14 ENCOUNTER — Other Ambulatory Visit: Payer: Self-pay

## 2023-04-19 ENCOUNTER — Inpatient Hospital Stay: Payer: Medicare HMO

## 2023-04-19 VITALS — BP 159/74 | HR 74 | Temp 98.0°F | Resp 19

## 2023-04-19 DIAGNOSIS — C169 Malignant neoplasm of stomach, unspecified: Secondary | ICD-10-CM | POA: Diagnosis not present

## 2023-04-19 DIAGNOSIS — Z5189 Encounter for other specified aftercare: Secondary | ICD-10-CM | POA: Diagnosis not present

## 2023-04-19 DIAGNOSIS — Z452 Encounter for adjustment and management of vascular access device: Secondary | ICD-10-CM | POA: Diagnosis not present

## 2023-04-19 DIAGNOSIS — Z79899 Other long term (current) drug therapy: Secondary | ICD-10-CM | POA: Diagnosis not present

## 2023-04-19 DIAGNOSIS — Z5112 Encounter for antineoplastic immunotherapy: Secondary | ICD-10-CM | POA: Diagnosis not present

## 2023-04-19 MED ORDER — SODIUM CHLORIDE 0.9% FLUSH
10.0000 mL | Freq: Once | INTRAVENOUS | Status: AC
  Administered 2023-04-19: 10 mL via INTRAVENOUS

## 2023-04-19 MED ORDER — HEPARIN SOD (PORK) LOCK FLUSH 100 UNIT/ML IV SOLN
250.0000 [IU] | Freq: Once | INTRAVENOUS | Status: AC
Start: 2023-04-19 — End: 2023-04-19
  Administered 2023-04-19: 250 [IU] via INTRAVENOUS

## 2023-04-19 MED ORDER — HEPARIN SOD (PORK) LOCK FLUSH 100 UNIT/ML IV SOLN
250.0000 [IU] | Freq: Once | INTRAVENOUS | Status: AC
  Administered 2023-04-19: 250 [IU] via INTRAVENOUS

## 2023-04-19 NOTE — Patient Instructions (Signed)

## 2023-04-26 ENCOUNTER — Other Ambulatory Visit: Payer: Self-pay

## 2023-04-26 ENCOUNTER — Inpatient Hospital Stay: Payer: Medicare HMO

## 2023-04-26 VITALS — BP 149/79 | HR 75 | Temp 98.3°F | Resp 18

## 2023-04-26 DIAGNOSIS — Z5189 Encounter for other specified aftercare: Secondary | ICD-10-CM | POA: Diagnosis not present

## 2023-04-26 DIAGNOSIS — Z452 Encounter for adjustment and management of vascular access device: Secondary | ICD-10-CM | POA: Diagnosis not present

## 2023-04-26 DIAGNOSIS — Z79899 Other long term (current) drug therapy: Secondary | ICD-10-CM | POA: Diagnosis not present

## 2023-04-26 DIAGNOSIS — C169 Malignant neoplasm of stomach, unspecified: Secondary | ICD-10-CM | POA: Diagnosis not present

## 2023-04-26 DIAGNOSIS — Z5112 Encounter for antineoplastic immunotherapy: Secondary | ICD-10-CM | POA: Diagnosis not present

## 2023-04-26 DIAGNOSIS — C160 Malignant neoplasm of cardia: Secondary | ICD-10-CM

## 2023-04-26 MED ORDER — SODIUM CHLORIDE 0.9% FLUSH
10.0000 mL | Freq: Once | INTRAVENOUS | Status: AC
  Administered 2023-04-26: 10 mL

## 2023-04-26 MED ORDER — HEPARIN SOD (PORK) LOCK FLUSH 100 UNIT/ML IV SOLN
250.0000 [IU] | Freq: Once | INTRAVENOUS | Status: AC
  Administered 2023-04-26: 250 [IU] via INTRAVENOUS

## 2023-04-26 NOTE — Patient Instructions (Signed)
PICC Home Care Guide A peripherally inserted central catheter (PICC) is a form of IV access that allows medicines and IV fluids to be quickly put into the blood and spread throughout the body. The PICC is a long, thin, flexible tube (catheter) that is put into a vein in a person's arm or leg. The catheter ends in a large vein just outside the heart called the superior vena cava (SVC). After the PICC is put in, a chest X-ray may be done to make sure that it is in the right place. A PICC may be placed for different reasons, such as: To give medicines and liquid nutrition. To give IV fluids and blood products. To take blood samples often. If there is trouble placing a peripheral intravenous (PIV) catheter. If cared for properly, a PICC can remain in place for many months. Having a PICC can allow you to go home from the hospital sooner and continue treatment at home. Medicines and PICC care can be managed at home by a family member, caregiver, or home health care team. What are the risks? Generally, having a PICC is safe. However, problems may occur, including: A blood clot (thrombus) forming in or at the end of the PICC. A blood clot forming in a vein (deep vein thrombosis) or traveling to the lung (pulmonary embolism). Inflammation of the vein (phlebitis) in which the PICC is placed. Infection at the insertion site or in the blood. Blood infections from central lines, like PICCs, can be serious and often require a hospital stay. PICC malposition, or PICC movement or poor placement. A break or cut in the PICC. Do not use scissors near the PICC. Nerve or tendon irritation or injury during PICC insertion. How to care for your PICC Please follow the specific guidelines provided by your health care provider. Preventing infection You and any caregivers should wash your hands often with soap and water for at least 20 seconds. Wash hands: Before touching the PICC or the infusion device. Before changing a  bandage (dressing). Do not change the dressing unless you have been taught to do so and have shown you are able to change it safely. Flush the PICC as told. Tell your health care provider right away if the PICC is hard to flush or does not flush. Do not use force to flush the PICC. Use clean and germ-free (sterile) supplies only. Keep the supplies in a dry place. Do not reuse needles, syringes, or any other supplies. Reusing supplies can lead to infection. Keep the PICC dressing dry and secure it with tape if the edges stop sticking to your skin. Check your PICC insertion site every day for signs of infection. Check for: Redness, swelling, or pain. Fluid or blood. Warmth. Pus or a bad smell. Preventing other problems Do not use a syringe that is less than 10 mL to flush the PICC. Do not have your blood pressure checked on the arm in which the PICC is placed. Do not ever pull or tug on the PICC. Keep it secured to your arm with tape or a stretch wrap when not in use. Do not take the PICC out yourself. Only a trained health care provider should remove the PICC. Keep pets and children away from your PICC. How to care for your PICC dressing Keep your PICC dressing clean and dry to prevent infection. Do not take baths, swim, or use a hot tub until your health care provider approves. Ask your health care provider if you can take  showers. You may only be allowed to take sponge baths. When you are allowed to shower: Ask your health care provider to teach you how to wrap the PICC. Cover the PICC with clear plastic wrap and tape to keep it dry while showering. Follow instructions from your health care provider about how to take care of your insertion site and dressing. Make sure you: Wash your hands with soap and water for at least 20 seconds before and after you change your dressing. If soap and water are not available, use hand sanitizer. Change your dressing only if taught to do so by your health care  provider. Your PICC dressing needs to be changed if it becomes loose or wet. Leave stitches (sutures), skin glue, or adhesive strips in place. These skin closures may need to stay in place for 2 weeks or longer. If adhesive strip edges start to loosen and curl up, you may trim the loose edges. Do not remove adhesive strips completely unless your health care provider tells you to do that. Follow these instructions at home: Disposal of supplies Throw away any syringes in a disposal container that is meant for sharp items (sharps container). You can buy a sharps container from a pharmacy, or you can make one by using an empty, hard plastic bottle with a lid. Place any used dressings or infusion bags into a plastic bag. Throw that bag in the trash. General instructions  Always carry your PICC identification card or wear a medical alert bracelet. Keep the tube clamped at all times, unless it is being used. Always carry a smooth-edge clamp with you to clamp the PICC if it breaks. Do not use scissors or sharp objects near the tube. You may bend your arm and move it freely. If your PICC is near or at the bend of your elbow, avoid activity with repeated motion at the elbow. Avoid lifting heavy objects as told by your health care provider. Keep all follow-up visits. This is important. You will need to have your PICC dressing changed at least once a week. Contact a health care provider if: You have pain in your arm, ear, face, or teeth. You have a fever or chills. You have redness, swelling, or pain around the insertion site. You have fluid or blood coming from the insertion site. Your insertion site feels warm to the touch. You have pus or a bad smell coming from the insertion site. Your skin feels hard and raised around the insertion site. Your PICC dressing has gotten wet or is coming off and you have not been taught how to change it. Get help right away if: You have problems with your PICC, such as  your PICC: Was tugged or pulled and has partially come out. Do not  push the PICC back in. Cannot be flushed, is hard to flush, or leaks around the insertion site when it is flushed. Makes a flushing sound when it is flushed. Appears to have a hole or tear. Is accidentally pulled all the way out. If this happens, cover the insertion site with a gauze dressing. Do not throw the PICC away. Your health care provider will need to check it to be sure the entire catheter came out. You feel your heart racing or skipping beats, or you have chest pain. You have shortness of breath or trouble breathing. You have swelling, redness, warmth, or pain in the arm in which the PICC is placed. You have a red streak going up your arm that  starts under the PICC dressing. These symptoms may be an emergency. Get help right away. Call 911. Do not wait to see if the symptoms will go away. Do not drive yourself to the hospital. Summary A peripherally inserted central catheter (PICC) is a long, thin, flexible tube (catheter) that is put into a vein in the arm or leg. If cared for properly, a PICC can remain in place for many months. Having a PICC can allow you to go home from the hospital sooner and continue treatment at home. The PICC is inserted using a germ-free (sterile) technique by a specially trained health care provider. Only a trained health care provider should remove it. Do not have your blood pressure checked on the arm in which your PICC is placed. Always keep your PICC identification card with you. This information is not intended to replace advice given to you by your health care provider. Make sure you discuss any questions you have with your health care provider. Document Revised: 12/10/2020 Document Reviewed: 12/10/2020 Elsevier Patient Education  2024 [employer].

## 2023-04-26 NOTE — Progress Notes (Signed)
Patient arrived to clinic today for a PICC line dressing change. PICC line exposed more then when this RN last changed the patients PICC line dressing. PICC flushes and aspirates blood easily. Patient reports no complaints. Clent Jacks, PA notified. IR notified. Patient to be at radiology at 10 am tomorrow morning. Patient and patient's spouse verbalized understanding.

## 2023-04-27 ENCOUNTER — Ambulatory Visit (HOSPITAL_COMMUNITY)
Admission: RE | Admit: 2023-04-27 | Discharge: 2023-04-27 | Disposition: A | Discharge status: Home / Self Care | Payer: Medicare HMO | Source: Ambulatory Visit | Attending: Hematology & Oncology | Admitting: Hematology & Oncology

## 2023-04-27 DIAGNOSIS — C160 Malignant neoplasm of cardia: Secondary | ICD-10-CM | POA: Diagnosis not present

## 2023-04-27 DIAGNOSIS — Z452 Encounter for adjustment and management of vascular access device: Secondary | ICD-10-CM | POA: Diagnosis not present

## 2023-04-27 DIAGNOSIS — T82528A Displacement of other cardiac and vascular devices and implants, initial encounter: Secondary | ICD-10-CM | POA: Diagnosis not present

## 2023-04-27 MED ORDER — HEPARIN SOD (PORK) LOCK FLUSH 100 UNIT/ML IV SOLN: 500.0000 [IU] | Freq: Once | INTRAVENOUS | Status: DC

## 2023-04-27 MED ORDER — HEPARIN SOD (PORK) LOCK FLUSH 100 UNIT/ML IV SOLN
INTRAVENOUS | Status: AC
  Filled 2023-04-27: qty 5

## 2023-04-27 NOTE — Procedures (Signed)
Pt's existing LUE DL PICC was exchanged out for new 36 cm DL basilic vein PICC. Tip superior cavoatrial junction. No immediate complications. OK to use. EBL< 2 cc.

## 2023-05-04 ENCOUNTER — Other Ambulatory Visit: Payer: Self-pay | Admitting: Hematology & Oncology

## 2023-05-04 ENCOUNTER — Inpatient Hospital Stay: Payer: Medicare HMO

## 2023-05-04 ENCOUNTER — Telehealth: Payer: Medicare HMO

## 2023-05-04 ENCOUNTER — Other Ambulatory Visit: Payer: Self-pay

## 2023-05-04 ENCOUNTER — Inpatient Hospital Stay: Payer: Medicare HMO | Admitting: Hematology & Oncology

## 2023-05-04 VITALS — BP 141/67 | HR 73 | Temp 98.7°F | Resp 18 | Wt 144.0 lb

## 2023-05-04 DIAGNOSIS — Z5112 Encounter for antineoplastic immunotherapy: Secondary | ICD-10-CM | POA: Diagnosis not present

## 2023-05-04 DIAGNOSIS — C160 Malignant neoplasm of cardia: Secondary | ICD-10-CM

## 2023-05-04 DIAGNOSIS — Z452 Encounter for adjustment and management of vascular access device: Secondary | ICD-10-CM | POA: Diagnosis not present

## 2023-05-04 DIAGNOSIS — Z79899 Other long term (current) drug therapy: Secondary | ICD-10-CM | POA: Diagnosis not present

## 2023-05-04 DIAGNOSIS — C169 Malignant neoplasm of stomach, unspecified: Secondary | ICD-10-CM | POA: Diagnosis not present

## 2023-05-04 DIAGNOSIS — Z5189 Encounter for other specified aftercare: Secondary | ICD-10-CM | POA: Diagnosis not present

## 2023-05-04 LAB — TSH: TSH: 3.001 u[IU]/mL (ref 0.350–4.500)

## 2023-05-04 LAB — CBC WITH DIFFERENTIAL (CANCER CENTER ONLY)
Abs Immature Granulocytes: 0.03 10*3/uL (ref 0.00–0.07)
Basophils Absolute: 0.1 10*3/uL (ref 0.0–0.1)
Basophils Relative: 2 %
Eosinophils Absolute: 0.3 10*3/uL (ref 0.0–0.5)
Eosinophils Relative: 5 %
HCT: 39.4 % (ref 39.0–52.0)
Hemoglobin: 12.7 g/dL — ABNORMAL LOW (ref 13.0–17.0)
Immature Granulocytes: 1 %
Lymphocytes Relative: 20 %
Lymphs Abs: 1.2 10*3/uL (ref 0.7–4.0)
MCH: 29 pg (ref 26.0–34.0)
MCHC: 32.2 g/dL (ref 30.0–36.0)
MCV: 90 fL (ref 80.0–100.0)
Monocytes Absolute: 0.5 10*3/uL (ref 0.1–1.0)
Monocytes Relative: 9 %
Neutro Abs: 3.9 10*3/uL (ref 1.7–7.7)
Neutrophils Relative %: 63 %
Platelet Count: 404 10*3/uL — ABNORMAL HIGH (ref 150–400)
RBC: 4.38 MIL/uL (ref 4.22–5.81)
RDW: 13.2 % (ref 11.5–15.5)
WBC Count: 6.1 10*3/uL (ref 4.0–10.5)
nRBC: 0 % (ref 0.0–0.2)

## 2023-05-04 LAB — CMP (CANCER CENTER ONLY)
ALT: 17 U/L (ref 0–44)
AST: 15 U/L (ref 15–41)
Albumin: 3.9 g/dL (ref 3.5–5.0)
Alkaline Phosphatase: 130 U/L — ABNORMAL HIGH (ref 38–126)
Anion gap: 6 (ref 5–15)
BUN: 13 mg/dL (ref 8–23)
CO2: 32 mmol/L (ref 22–32)
Calcium: 10 mg/dL (ref 8.9–10.3)
Chloride: 102 mmol/L (ref 98–111)
Creatinine: 1.05 mg/dL (ref 0.61–1.24)
GFR, Estimated: >60 mL/min (ref >60)
Glucose, Bld: 97 mg/dL (ref 70–99)
Potassium: 4.8 mmol/L (ref 3.5–5.1)
Sodium: 140 mmol/L (ref 135–145)
Total Bilirubin: 0.5 mg/dL (ref <1.2)
Total Protein: 6.8 g/dL (ref 6.5–8.1)

## 2023-05-04 MED ORDER — HYDROMORPHONE HCL 1 MG/ML IJ SOLN
1.0000 mg | Freq: Once | INTRAMUSCULAR | Status: AC
  Administered 2023-05-04: 1 mg via INTRAVENOUS
  Filled 2023-05-04: qty 1

## 2023-05-04 MED ORDER — SODIUM CHLORIDE 0.9 % IV SOLN: Freq: Once | INTRAVENOUS | Status: AC

## 2023-05-04 MED ORDER — SODIUM CHLORIDE 0.9 % IV SOLN
360.0000 mg | Freq: Once | INTRAVENOUS | Status: AC
  Administered 2023-05-04: 360 mg via INTRAVENOUS
  Filled 2023-05-04: qty 24

## 2023-05-04 MED ORDER — ALTEPLASE 2 MG IJ SOLR
2.0000 mg | Freq: Once | INTRAMUSCULAR | Status: AC
  Administered 2023-05-04: 2 mg
  Filled 2023-05-04: qty 2

## 2023-05-04 NOTE — Patient Instructions (Signed)
Nivolumab Injection What is this medication? NIVOLUMAB (nye VOL ue mab) treats some types of cancer. It works by helping your immune system slow or stop the spread of cancer cells. It is a monoclonal antibody. This medicine may be used for other purposes; ask your health care provider or pharmacist if you have questions. COMMON BRAND NAME(S): Opdivo What should I tell my care team before I take this medication? They need to know if you have any of these conditions: Allogeneic stem cell transplant (uses someone else's stem cells) Autoimmune diseases, such as Crohn disease, ulcerative colitis, lupus History of chest radiation Nervous system problems, such as Guillain-Barre syndrome or myasthenia gravis Organ transplant An unusual or allergic reaction to nivolumab, other medications, foods, dyes, or preservatives Pregnant or trying to get pregnant Breast-feeding How should I use this medication? This medication is infused into a vein. It is given in a hospital or clinic setting. A special MedGuide will be given to you before each treatment. Be sure to read this information carefully each time. Talk to your care team about the use of this medication in children. While it may be prescribed for children as young as 12 years for selected conditions, precautions do apply. Overdosage: If you think you have taken too much of this medicine contact a poison control center or emergency room at once. NOTE: This medicine is only for you. Do not share this medicine with others. What if I miss a dose? Keep appointments for follow-up doses. It is important not to miss your dose. Call your care team if you are unable to keep an appointment. What may interact with this medication? Interactions have not been studied. This list may not describe all possible interactions. Give your health care provider a list of all the medicines, herbs, non-prescription drugs, or dietary supplements you use. Also tell them if you  smoke, drink alcohol, or use illegal drugs. Some items may interact with your medicine. What should I watch for while using this medication? Your condition will be monitored carefully while you are receiving this medication. You may need blood work while taking this medication. This medication may cause serious skin reactions. They can happen weeks to months after starting the medication. Contact your care team right away if you notice fevers or flu-like symptoms with a rash. The rash may be red or purple and then turn into blisters or peeling of the skin. You may also notice a red rash with swelling of the face, lips, or lymph nodes in your neck or under your arms. Tell your care team right away if you have any change in your eyesight. Talk to your care team if you are pregnant or think you might be pregnant. A negative pregnancy test is required before starting this medication. A reliable form of contraception is recommended while taking this medication and for 5 months after the last dose. Talk to your care team about effective forms of contraception. Do not breast-feed while taking this medication and for 5 months after the last dose. What side effects may I notice from receiving this medication? Side effects that you should report to your care team as soon as possible: Allergic reactions--skin rash, itching, hives, swelling of the face, lips, tongue, or throat Dry cough, shortness of breath or trouble breathing Eye pain, redness, irritation, or discharge with blurry or decreased vision Heart muscle inflammation--unusual weakness or fatigue, shortness of breath, chest pain, fast or irregular heartbeat, dizziness, swelling of the ankles, feet, or hands Hormone  gland problems--headache, sensitivity to light, unusual weakness or fatigue, dizziness, fast or irregular heartbeat, increased sensitivity to cold or heat, excessive sweating, constipation, hair loss, increased thirst or amount of urine,  tremors or shaking, irritability Infusion reactions--chest pain, shortness of breath or trouble breathing, feeling faint or lightheaded Kidney injury (glomerulonephritis)--decrease in the amount of urine, red or dark brown urine, foamy or bubbly urine, swelling of the ankles, hands, or feet Liver injury--right upper belly pain, loss of appetite, nausea, light-colored stool, dark yellow or brown urine, yellowing skin or eyes, unusual weakness or fatigue Pain, tingling, or numbness in the hands or feet, muscle weakness, change in vision, confusion or trouble speaking, loss of balance or coordination, trouble walking, seizures Rash, fever, and swollen lymph nodes Redness, blistering, peeling, or loosening of the skin, including inside the mouth Sudden or severe stomach pain, bloody diarrhea, fever, nausea, vomiting Side effects that usually do not require medical attention (report these to your care team if they continue or are bothersome): Bone, joint, or muscle pain Diarrhea Fatigue Loss of appetite Nausea Skin rash This list may not describe all possible side effects. Call your doctor for medical advice about side effects. You may report side effects to FDA at 1-800-FDA-1088. Where should I keep my medication? This medication is given in a hospital or clinic. It will not be stored at home. NOTE: This sheet is a summary. It may not cover all possible information. If you have questions about this medicine, talk to your doctor, pharmacist, or health care provider.  2024 Elsevier/Gold Standard (2021-09-21 00:00:00)

## 2023-05-04 NOTE — Progress Notes (Signed)
1440 Picc line pulled per Dr. Myna Hidalgo and patient's request. Intact, length is 35.25, site unremarkable, Patient tolerated well.

## 2023-05-04 NOTE — Progress Notes (Signed)
Hematology and Oncology Follow Up Visit  George Dyer [id number] 02-Jan-1941 82 y.o. 05/04/2023   Principle Diagnosis:  Stage IIB (T3N0M0) Gastric cancer -- declines surgery -recurrent-  HER2(-)/ BRAF (+)   Current Therapy:        FOLFOX - s/p cycle #4-  start on 09/15/2021 XRT/Xeloda -- start on 12/07/2021 --completed on 01/12/2022 FOLFIRI/Opdivo -- st/p cycle #1 on 10/19/2022 -- d/c FOLFIRI on 11/02/2022 --changed Opdivo to 21-day cycles on 12/22/2022   Interim History:  George Dyer is here today with George Dyer wife for follow-up.  George Dyer is doing okay.  George Dyer seems to have had a little bit of a cutaneous reaction to the Opdivo.  This is appears to be on George Dyer hands and feet.  It seems to be that George Dyer has these scaly type lesions.  I told George Dyer to try some over-the-counter steroid cream or some over-the-counter skin cream.  Of note, George Dyer is also taking albendazole.  I suspect this was given to George Dyer by George Dyer integrative doctor.  From my point of view, I really do not have a problem with this.  George Dyer still has a neuropathy.  This is always been a problem for George Dyer.  George Dyer is on medication for this.  George Dyer seems to be eating okay.  George Dyer appetite is doing okay.  Unfortunate, George Dyer PICC line is not working all that well.  Is possible that we might be able to DC the PICC line since were not doing any infusional treatments.  George Dyer has had no fever.  George Dyer has had no mouth sores.  George Dyer has had no headache.  We are watching George Dyer thyroid.  George Dyer TSH back in October was 3.0.  Currently, I would have to say that George Dyer performance status is probably ECOG 1.   Medications:  Allergies as of 05/04/2023       Reactions   Percocet [oxycodone-acetaminophen] Nausea And Vomiting   Oxycodone Other (See Comments)   Doesn't remember   Oxycodone-aspirin Nausea And Vomiting        Medication List        Accurate as of May 04, 2023 12:31 PM. If you have any questions, ask your nurse or doctor.          acetaminophen 325 MG  tablet Commonly known as: TYLENOL Take 650 mg by mouth 2 (two) times daily as needed.   alum & mag hydroxide-simeth 200-200-20 MG/5ML suspension Commonly known as: MAALOX/MYLANTA Take 30 mLs by mouth daily.   dexamethasone 4 MG tablet Commonly known as: DECADRON Take 2 tablets (8 mg total) by mouth daily. Start the day after chemotherapy for 2 days. Take with food.   doxylamine (Sleep) 25 MG tablet Commonly known as: UNISOM Take 25 mg by mouth at bedtime as needed.   dronabinol 2.5 MG capsule Commonly known as: MARINOL Take 1 capsule (2.5 mg total) by mouth 2 (two) times daily before a meal.   DULoxetine 30 MG capsule Commonly known as: Cymbalta Take 2 capsules (60 mg total) by mouth at bedtime. Take 1 capsule (30 mg) for the 1st week, then increase to 2 capsules thereafter.   Heparin Na (Pork) Lock Flsh PF 100 UNIT/ML Soln Flush each PICC lumen with 2.5 mLs (250 Units total) as directed every Monday, Wednesday, and Friday.   lactulose 10 GM/15ML solution Commonly known as: CHRONULAC Take 30 mLs (20 g total) by mouth 3 (three) times daily.   levothyroxine 75 MCG tablet Commonly known as: SYNTHROID TAKE 1 TABLET BY MOUTH EVERY DAY  BEFORE BREAKFAST What changed: See the new instructions. Changed by: Josph Macho   loperamide 2 MG capsule Commonly known as: IMODIUM Take 2 tabs by mouth with first loose stool, then 1 tab with each additional loose stool as needed. Do not exceed 8 tabs in a 24-hour period   megestrol 40 MG/ML suspension Commonly known as: MEGACE TAKE 10 MLS (400 MG TOTAL) BY MOUTH 2 (TWO) TIMES DAILY.   megestrol 625 MG/5ML suspension Commonly known as: MEGACE ES Take 5 mLs (625 mg total) by mouth daily.   Normal Saline Flush 0.9 % Soln Inject 10 mLs into each port 3 (three) times a week. Flush each port of PICC Monday, Wednesday and Friday.   omeprazole 40 MG capsule Commonly known as: PRILOSEC TAKE 1 CAPSULE (40 MG TOTAL) BY MOUTH DAILY.    ondansetron 8 MG tablet Commonly known as: ZOFRAN Take 1 tablet (8 mg total) by mouth every 8 (eight) hours as needed for nausea, vomiting or refractory nausea / vomiting. Start on the third day after chemotherapy.   polyethylene glycol powder 17 GM/SCOOP powder Commonly known as: GlycoLax Take 17 g by mouth 2 (two) times daily.   prochlorperazine 10 MG tablet Commonly known as: COMPAZINE Take 1 tablet (10 mg total) by mouth every 6 (six) hours as needed for nausea or vomiting.   temazepam 7.5 MG capsule Commonly known as: RESTORIL Take 1 capsule (7.5 mg total) by mouth at bedtime as needed for sleep.   traMADol 50 MG tablet Commonly known as: ULTRAM Take by mouth.        Allergies:  Allergies  Allergen Reactions   Percocet [Oxycodone-Acetaminophen] Nausea And Vomiting   Oxycodone Other (See Comments)    Doesn't remember   Oxycodone-Aspirin Nausea And Vomiting    Past Medical History, Surgical history, Social history, and Family History were reviewed and updated.  Review of Systems: Review of Systems  Constitutional: Negative.   HENT: Negative.    Eyes: Negative.   Respiratory: Negative.    Cardiovascular: Negative.   Gastrointestinal:  Positive for nausea and vomiting.  Genitourinary: Negative.   Musculoskeletal: Negative.   Skin: Negative.   Neurological: Negative.   Endo/Heme/Allergies: Negative.   Psychiatric/Behavioral: Negative.     Marland Kitchen   Physical Exam: Vital signs show temperature of 98.7.  Pulse 73.  Blood pressure 141/67.  Weight is 144 pounds.    Wt Readings from Last 3 Encounters:  05/04/23 144 lb (65.3 kg)  04/11/23 145 lb (65.8 kg)  03/29/23 145 lb (65.8 kg)    Physical Exam Vitals reviewed.  HENT:     Head: Normocephalic and atraumatic.  Eyes:     Pupils: Pupils are equal, round, and reactive to light.  Cardiovascular:     Rate and Rhythm: Normal rate and regular rhythm.     Heart sounds: Normal heart sounds.  Pulmonary:     Effort:  Pulmonary effort is normal.     Breath sounds: Normal breath sounds.  Abdominal:     General: Bowel sounds are normal.     Palpations: Abdomen is soft.  Musculoskeletal:        General: No tenderness or deformity. Normal range of motion.     Cervical back: Normal range of motion.  Lymphadenopathy:     Cervical: No cervical adenopathy.  Skin:    General: Skin is warm and dry.     Findings: No erythema or rash.  Neurological:     Mental Status: George Dyer is alert and oriented  to person, place, and time.  Psychiatric:        Behavior: Behavior normal.        Thought Content: Thought content normal.        Judgment: Judgment normal.     Lab Results  Component Value Date   WBC 6.1 05/04/2023   HGB 12.7 (L) 05/04/2023   HCT 39.4 05/04/2023   MCV 90.0 05/04/2023   PLT 404 (H) 05/04/2023   Lab Results  Component Value Date   FERRITIN 74 10/04/2022   IRON 93 10/04/2022   TIBC 279 10/04/2022   UIBC 186 10/04/2022   IRONPCTSAT 33 10/04/2022   Lab Results  Component Value Date   RETICCTPCT 0.7 08/18/2021   RBC 4.38 05/04/2023   No results found for: "KPAFRELGTCHN", "LAMBDASER", "KAPLAMBRATIO" Lab Results  Component Value Date   IGGSERUM 931 05/05/2022   IGMSERUM 59 05/05/2022   No results found for: "TOTALPROTELP", "ALBUMINELP", "A1GS", "A2GS", "BETS", "BETA2SER", "GAMS", "MSPIKE", "SPEI"   Chemistry      Component Value Date/Time   NA 138 04/13/2023 1056   NA 139 07/23/2020 0818   K 4.2 04/13/2023 1056   CL 103 04/13/2023 1056   CO2 30 04/13/2023 1056   BUN 17 04/13/2023 1056   BUN 10 07/23/2020 0818   CREATININE 0.92 04/13/2023 1056   CREATININE 1.06 06/27/2015 0837      Component Value Date/Time   CALCIUM 9.3 04/13/2023 1056   ALKPHOS 80 04/13/2023 1056   AST 14 (L) 04/13/2023 1056   ALT 11 04/13/2023 1056   BILITOT 0.6 04/13/2023 1056       Impression and Plan: George Dyer is a very pleasant 82 yo caucasian gentleman with localized gastric cancer. So far, George Dyer  has been very reluctant to undergo surgery for treatment of this localized disease.  We will continue George Dyer on the immunotherapy right now.  Again, I do not see a problem with George Dyer being on the albendazole.  I do not see how this is going to be of any detriment.  We will plan to get George Dyer back in 3 weeks.  I am just glad that George Dyer quality life is doing pretty well right now.  I am sure that George Dyer will have a wonderful Thanksgiving with George Dyer family.  Marland Kitchen   Josph Macho, MD 11/27/202412:31 PM

## 2023-05-04 NOTE — Progress Notes (Signed)
No blood return from either lumen on picc @ 1240 and 1310

## 2023-05-13 ENCOUNTER — Other Ambulatory Visit (HOSPITAL_BASED_OUTPATIENT_CLINIC_OR_DEPARTMENT_OTHER): Payer: Self-pay

## 2023-05-14 ENCOUNTER — Other Ambulatory Visit: Payer: Self-pay | Admitting: Hematology & Oncology

## 2023-05-14 DIAGNOSIS — C160 Malignant neoplasm of cardia: Secondary | ICD-10-CM

## 2023-05-16 ENCOUNTER — Other Ambulatory Visit: Payer: Self-pay | Admitting: *Deleted

## 2023-05-16 DIAGNOSIS — C160 Malignant neoplasm of cardia: Secondary | ICD-10-CM

## 2023-05-16 MED ORDER — TEMAZEPAM 7.5 MG PO CAPS: 7.5000 mg | ORAL_CAPSULE | Freq: Every evening | ORAL | 0 refills | Status: DC | PRN

## 2023-05-17 ENCOUNTER — Other Ambulatory Visit: Payer: Self-pay | Admitting: Hematology & Oncology

## 2023-05-18 ENCOUNTER — Other Ambulatory Visit: Payer: Self-pay | Admitting: Hematology & Oncology

## 2023-05-18 DIAGNOSIS — C160 Malignant neoplasm of cardia: Secondary | ICD-10-CM

## 2023-05-22 IMAGING — PT NM PET TUM IMG RESTAG (PS) SKULL BASE T - THIGH
1 series · 5 of 5 positions shown · non-contrast
Comparison: PET-CT 08/20/2021. CTs of the chest, abdomen and pelvis
06/23/2021

CLINICAL DATA: Subsequent treatment strategy for localized gastric
cancer.

EXAM:
NUCLEAR MEDICINE PET SKULL BASE TO THIGH
TECHNIQUE: 7.88 mCi F-18 FDG was injected intravenously. Full-ring PET imaging
was performed from the skull base to thigh after the radiotracer. CT
data was obtained and used for attenuation correction and anatomic
localization.
Fasting blood glucose: 107 mg/dl

[Series 1240: results mm oncology reading · 4.0mm · 1.07mm/px · 5 of 5 slices shown]
[im 1/5]
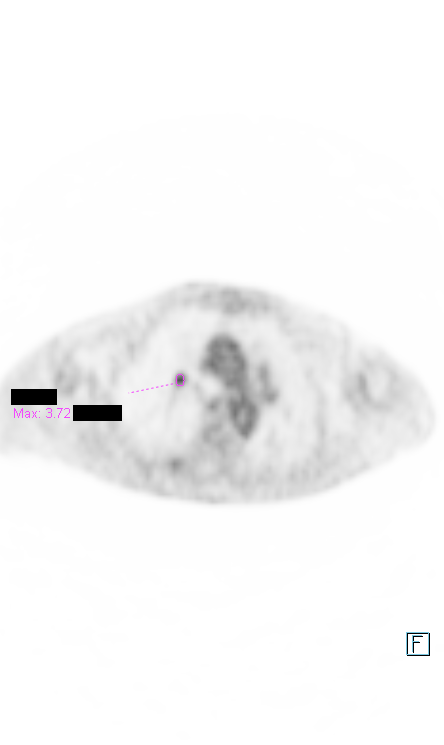
[im 2/5]
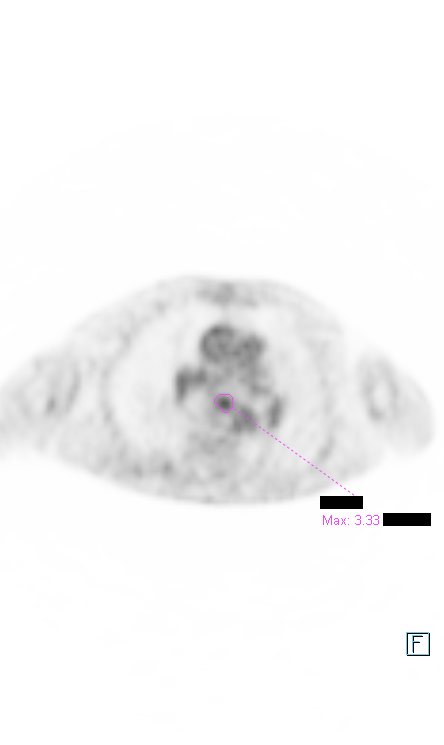
[im 3/5]
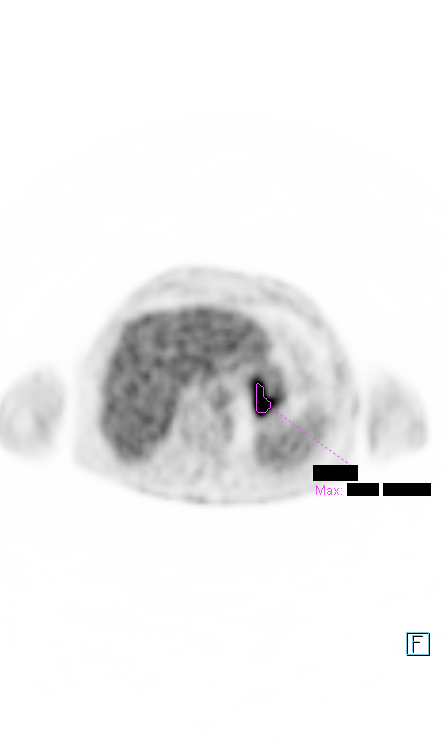
[im 4/5]
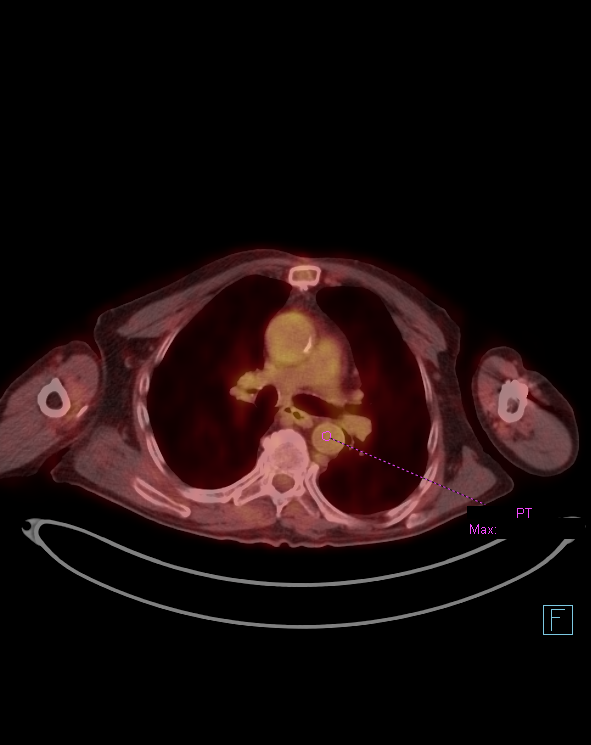
[im 5/5]
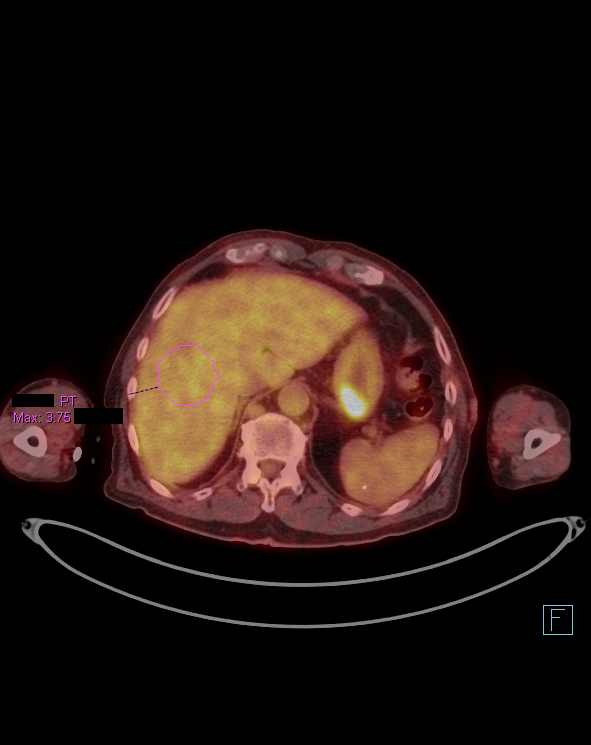

[5 of 5 positions shown; findings below may reference images not displayed]

FINDINGS: Mediastinal blood pool activity: SUV max

Liver activity: SUV max

NECK:

No hypermetabolic cervical lymph nodes are identified.There are no
lesions of the pharyngeal mucosal space.

Incidental CT findings: none

CHEST:

There are no hypermetabolic mediastinal, hilar or axillary lymph
nodes. Low level subcarinal metabolic activity (SUV max 3.3), likely
reactive. Focal hypermetabolic activity is again noted in the right
suprahilar region without clear corresponding abnormality on the CT
images. This has an SUV max of 3.7 (previously 3.6). No other
pulmonary hypermetabolic activity or suspicious nodularity.

Incidental CT findings: Right arm PICC projects to the upper SVC
level. The SVC is chronically occluded with multiple paraspinal
venous collaterals, as before. Atherosclerosis of the aorta, great
vessels and coronary arteries. Mild emphysema with scattered
calcified granulomas.

ABDOMEN/PELVIS:

There is no hypermetabolic activity within the liver, adrenal
glands, spleen or pancreas. Persistent intense hypermetabolic
activity involving the posterior wall of the gastric fundus (SUV max
13.5, previously 6.8). The distribution of this hypermetabolic
activity is similar to the prior study. No adjacent or distant
hypermetabolic lymph nodes identified in the abdomen or pelvis.

Incidental CT findings: Aortic and branch vessel atherosclerosis.
Calcified splenic granulomas. Venous collaterals in the anterior
abdominal wall.

SKELETON:

There is no hypermetabolic activity to suggest osseous metastatic
disease.

Incidental CT findings: Previous lumbar fusion and proximal left
humeral ORIF.
IMPRESSION: 1. Persistent hypermetabolic activity posteriorly in the gastric
fundus at the site of the patient's known gastric malignancy. This
has mildly increased in intensity compared with the prior PET-CT. No
hypermetabolic regional adenopathy or definite distant metastases
identified.
2. Persistent indeterminate hypermetabolic activity in the right
suprahilar region without clear corresponding CT finding. This could
reflect a reactive intrapulmonary node. Given underlying emphysema,
attention on follow-up recommended to exclude an occult nodule.
3. Sequela of chronic SVC occlusion.
4. Coronary and aortic atherosclerosis (ZRG31-H4Y.Y). Emphysema
(ZRG31-83S.2).

## 2023-05-25 ENCOUNTER — Inpatient Hospital Stay (HOSPITAL_BASED_OUTPATIENT_CLINIC_OR_DEPARTMENT_OTHER): Payer: Medicare HMO | Admitting: Hematology & Oncology

## 2023-05-25 ENCOUNTER — Inpatient Hospital Stay: Payer: Medicare HMO

## 2023-05-25 ENCOUNTER — Encounter: Payer: Self-pay | Admitting: Hematology & Oncology

## 2023-05-25 ENCOUNTER — Inpatient Hospital Stay: Payer: Medicare HMO | Attending: Hematology & Oncology

## 2023-05-25 VITALS — BP 133/67 | HR 81 | Temp 97.9°F | Resp 18 | Wt 153.2 lb

## 2023-05-25 VITALS — BP 120/73 | HR 84 | Resp 20

## 2023-05-25 DIAGNOSIS — Z5112 Encounter for antineoplastic immunotherapy: Secondary | ICD-10-CM | POA: Diagnosis present

## 2023-05-25 DIAGNOSIS — C160 Malignant neoplasm of cardia: Secondary | ICD-10-CM

## 2023-05-25 DIAGNOSIS — Z79620 Long term (current) use of immunosuppressive biologic: Secondary | ICD-10-CM | POA: Diagnosis not present

## 2023-05-25 DIAGNOSIS — C169 Malignant neoplasm of stomach, unspecified: Secondary | ICD-10-CM | POA: Diagnosis present

## 2023-05-25 LAB — CBC WITH DIFFERENTIAL (CANCER CENTER ONLY)
Abs Immature Granulocytes: 0.02 10*3/uL (ref 0.00–0.07)
Basophils Absolute: 0.1 10*3/uL (ref 0.0–0.1)
Basophils Relative: 1 %
Eosinophils Absolute: 0.6 10*3/uL — ABNORMAL HIGH (ref 0.0–0.5)
Eosinophils Relative: 7 %
HCT: 35.9 % — ABNORMAL LOW (ref 39.0–52.0)
Hemoglobin: 11.6 g/dL — ABNORMAL LOW (ref 13.0–17.0)
Immature Granulocytes: 0 %
Lymphocytes Relative: 23 %
Lymphs Abs: 1.7 10*3/uL (ref 0.7–4.0)
MCH: 28.9 pg (ref 26.0–34.0)
MCHC: 32.3 g/dL (ref 30.0–36.0)
MCV: 89.3 fL (ref 80.0–100.0)
Monocytes Absolute: 0.9 10*3/uL (ref 0.1–1.0)
Monocytes Relative: 12 %
Neutro Abs: 4.2 10*3/uL (ref 1.7–7.7)
Neutrophils Relative %: 57 %
Platelet Count: 266 10*3/uL (ref 150–400)
RBC: 4.02 MIL/uL — ABNORMAL LOW (ref 4.22–5.81)
RDW: 13.2 % (ref 11.5–15.5)
WBC Count: 7.5 10*3/uL (ref 4.0–10.5)
nRBC: 0 % (ref 0.0–0.2)

## 2023-05-25 LAB — CMP (CANCER CENTER ONLY)
ALT: 16 U/L (ref 0–44)
AST: 17 U/L (ref 15–41)
Albumin: 3.7 g/dL (ref 3.5–5.0)
Alkaline Phosphatase: 85 U/L (ref 38–126)
Anion gap: 8 (ref 5–15)
BUN: 21 mg/dL (ref 8–23)
CO2: 30 mmol/L (ref 22–32)
Calcium: 9.1 mg/dL (ref 8.9–10.3)
Chloride: 101 mmol/L (ref 98–111)
Creatinine: 1.32 mg/dL — ABNORMAL HIGH (ref 0.61–1.24)
GFR, Estimated: 54 mL/min — ABNORMAL LOW (ref >60)
Glucose, Bld: 97 mg/dL (ref 70–99)
Potassium: 4.7 mmol/L (ref 3.5–5.1)
Sodium: 139 mmol/L (ref 135–145)
Total Bilirubin: 0.6 mg/dL (ref <1.2)
Total Protein: 6.3 g/dL — ABNORMAL LOW (ref 6.5–8.1)

## 2023-05-25 LAB — TSH: TSH: 3.66 u[IU]/mL (ref 0.350–4.500)

## 2023-05-25 LAB — LACTATE DEHYDROGENASE: LDH: 109 U/L (ref 98–192)

## 2023-05-25 MED ORDER — SODIUM CHLORIDE 0.9 % IV SOLN: Freq: Once | INTRAVENOUS | Status: AC

## 2023-05-25 MED ORDER — HYDROMORPHONE HCL 1 MG/ML IJ SOLN
1.0000 mg | Freq: Once | INTRAMUSCULAR | Status: AC
Start: 2023-05-25 — End: 2023-05-25
  Administered 2023-05-25: 1 mg via INTRAVENOUS
  Filled 2023-05-25: qty 1

## 2023-05-25 MED ORDER — SODIUM CHLORIDE 0.9 % IV SOLN
360.0000 mg | INTRAVENOUS | Status: DC
  Administered 2023-05-25: 360 mg via INTRAVENOUS
  Filled 2023-05-25: qty 24

## 2023-05-25 NOTE — Progress Notes (Signed)
Hematology and Oncology Follow Up Visit  George Dyer [id number] [birth year] 82 y.o. 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  I must say that he is does look quite good.  He is getting quite a bit of weight which I am very happy about.  He is on both albendazole and ivermectin.  He seems to doing pretty well with these.  He is having no abdominal pain.  There is no diarrhea.  Has had no bleeding.  Has had no nausea or vomiting.  He is eating which is wonderful to see.  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  This has been a chronic problem for him.  He has had no headache.  He has had no mouth sores.  Overall, I would say that his performance status is probably ECOG 1.    Medications:  Allergies as of 05/25/2023       Reactions   Percocet [oxycodone-acetaminophen] Nausea And Vomiting   Oxycodone Other (See Comments)   Doesn't remember   Oxycodone-aspirin Nausea And Vomiting        Medication List        Accurate as of May 25, 2023 10:47 AM. If you have any questions, ask your nurse or doctor.          acetaminophen 325 MG tablet Commonly known as: TYLENOL Take 650 mg by mouth 2 (two) times daily as needed.   albendazole 200 MG tablet Commonly known as: ALBENZA Take 200 mg by mouth daily.   alum & mag hydroxide-simeth 200-200-20 MG/5ML suspension Commonly known as: MAALOX/MYLANTA Take 30 mLs by mouth daily.   dexamethasone 4 MG tablet Commonly known as: DECADRON Take 2 tablets (8 mg total) by mouth daily. Start the day after chemotherapy for 2 days. Take with  food.   doxylamine (Sleep) 25 MG tablet Commonly known as: UNISOM Take 25 mg by mouth at bedtime as needed.   dronabinol 2.5 MG capsule Commonly known as: MARINOL Take 1 capsule (2.5 mg total) by mouth 2 (two) times daily before a meal.   DULoxetine 30 MG capsule Commonly known as: Cymbalta Take 2 capsules (60 mg total) by mouth at bedtime. Take 1 capsule (30 mg) for the 1st week, then increase to 2 capsules thereafter.   Heparin Na (Pork) Lock Flsh PF 100 UNIT/ML Soln Flush each PICC lumen with 2.5 mLs (250 Units total) as directed every Monday, Wednesday, and Friday.   ivermectin 3 MG Tabs tablet Commonly known as: STROMECTOL Take by mouth once.   lactulose 10 GM/15ML solution Commonly known as: CHRONULAC Take 30 mLs (20 g total) by mouth 3 (three) times daily.   levothyroxine 75 MCG tablet Commonly known as: SYNTHROID TAKE 1 TABLET BY MOUTH EVERY DAY BEFORE BREAKFAST   loperamide 2 MG capsule Commonly known as: IMODIUM Take 2 tabs by mouth with first loose stool, then 1 tab with each additional loose stool as needed. Do not exceed 8 tabs in a 24-hour period   megestrol 40 MG/ML suspension Commonly known as: MEGACE TAKE 10 MLS (400 MG TOTAL) BY MOUTH 2 (  TWO) TIMES DAILY.   megestrol 625 MG/5ML suspension Commonly known as: MEGACE ES Take 5 mLs (625 mg total) by mouth daily.   Normal Saline Flush 0.9 % Soln Inject 10 mLs into each port 3 (three) times a week. Flush each port of PICC Monday, Wednesday and Friday.   omeprazole 40 MG capsule Commonly known as: PRILOSEC TAKE 1 CAPSULE (40 MG TOTAL) BY MOUTH DAILY.   ondansetron 8 MG tablet Commonly known as: ZOFRAN TAKE 1 TABLET BY MOUTH EVERY 8 HOURS AS NEEDED FOR NAUSEA, VOMITING OR REFRACTORY NAUSEA / VOMITING. START ON THE THIRD DAY AFTER CHEMOTHERAPY.   polyethylene glycol powder 17 GM/SCOOP powder Commonly known as: GlycoLax Take 17 g by mouth 2 (two) times daily.   prochlorperazine 10 MG tablet Commonly  known as: COMPAZINE Take 1 tablet (10 mg total) by mouth every 6 (six) hours as needed for nausea or vomiting.   temazepam 7.5 MG capsule Commonly known as: RESTORIL Take 1 capsule (7.5 mg total) by mouth at bedtime as needed for sleep.   traMADol 50 MG tablet Commonly known as: ULTRAM Take by mouth.        Allergies:  Allergies  Allergen Reactions   Percocet [Oxycodone-Acetaminophen] Nausea And Vomiting   Oxycodone Other (See Comments)    Doesn't remember   Oxycodone-Aspirin Nausea And Vomiting    Past Medical History, Surgical history, Social history, and Family History were reviewed and updated.  Review of Systems: Review of Systems  Constitutional: Negative.   HENT: Negative.    Eyes: Negative.   Respiratory: Negative.    Cardiovascular: Negative.   Gastrointestinal:  Positive for nausea and vomiting.  Genitourinary: Negative.   Musculoskeletal: Negative.   Skin: Negative.   Neurological: Negative.   Endo/Heme/Allergies: Negative.   Psychiatric/Behavioral: Negative.     Marland Kitchen   Physical Exam: Vital signs show temperature of 97.9.  Pulse 81.  Blood pressure 133/67.  Weight is 153 pounds   Wt Readings from Last 3 Encounters:  05/25/23 153 lb 4 oz (69.5 kg)  05/04/23 144 lb (65.3 kg)  04/11/23 145 lb (65.8 kg)    Physical Exam Vitals reviewed.  HENT:     Head: Normocephalic and atraumatic.  Eyes:     Pupils: Pupils are equal, round, and reactive to light.  Cardiovascular:     Rate and Rhythm: Normal rate and regular rhythm.     Heart sounds: Normal heart sounds.  Pulmonary:     Effort: Pulmonary effort is normal.     Breath sounds: Normal breath sounds.  Abdominal:     General: Bowel sounds are normal.     Palpations: Abdomen is soft.  Musculoskeletal:        General: No tenderness or deformity. Normal range of motion.     Cervical back: Normal range of motion.  Lymphadenopathy:     Cervical: No cervical adenopathy.  Skin:    General: Skin is warm  and dry.     Findings: No erythema or rash.  Neurological:     Mental Status: He is alert and oriented to person, place, and time.  Psychiatric:        Behavior: Behavior normal.        Thought Content: Thought content normal.        Judgment: Judgment normal.      Lab Results  Component Value Date   WBC 7.5 05/25/2023   HGB 11.6 (L) 05/25/2023   HCT 35.9 (L) 05/25/2023   MCV 89.3 05/25/2023  PLT 266 05/25/2023   Lab Results  Component Value Date   FERRITIN 74 10/04/2022   IRON 93 10/04/2022   TIBC 279 10/04/2022   UIBC 186 10/04/2022   IRONPCTSAT 33 10/04/2022   Lab Results  Component Value Date   RETICCTPCT 0.7 08/18/2021   RBC 4.02 (L) 05/25/2023   No results found for: "KPAFRELGTCHN", "LAMBDASER", "[facility]" Lab Results  Component Value Date   IGGSERUM 931 05/05/2022   IGMSERUM 59 05/05/2022   No results found for: "TOTALPROTELP", "ALBUMINELP", "A1GS", "A2GS", "BETS", "BETA2SER", "GAMS", "MSPIKE", "SPEI"   Chemistry      Component Value Date/Time   NA 139 05/25/2023 0905   NA 139 07/23/2020 0818   K 4.7 05/25/2023 0905   CL 101 05/25/2023 0905   CO2 30 05/25/2023 0905   BUN 21 05/25/2023 0905   BUN 10 07/23/2020 0818   CREATININE 1.32 (H) 05/25/2023 0905   CREATININE 1.06 06/27/2015 0837      Component Value Date/Time   CALCIUM 9.1 05/25/2023 0905   ALKPHOS 85 05/25/2023 0905   AST 17 05/25/2023 0905   ALT 16 05/25/2023 0905   BILITOT 0.6 05/25/2023 0905       Impression and Plan: Mr. Ackermann is a very pleasant 82 yo caucasian gentleman with localized gastric cancer. So far, he has been very reluctant to undergo surgery for treatment of this localized disease.  We will continue him on the immunotherapy right now.  Hopefully, with the combination of immunotherapy and the complementary agents that he is using, we will see a response.  He has been on the albendazole/ivermectin for about a month now.  I think we still need to get this a little  more time before doing of the scan.  I probably would do another scan on him sometime in late January.  We will go ahead with the immunotherapy today.  I will then plan to get him back in 3 weeks.  After the next cycle of immunotherapy, then we will set his scans up.  Marland Kitchen   Josph Macho, MD 12/18/202410:47 AM

## 2023-05-26 ENCOUNTER — Other Ambulatory Visit: Payer: Self-pay

## 2023-05-30 ENCOUNTER — Encounter: Payer: Self-pay | Admitting: Hematology & Oncology

## 2023-06-05 ENCOUNTER — Other Ambulatory Visit: Payer: Self-pay

## 2023-06-13 ENCOUNTER — Encounter: Payer: Self-pay | Admitting: Hematology & Oncology

## 2023-06-14 ENCOUNTER — Encounter: Payer: Self-pay | Admitting: Hematology & Oncology

## 2023-06-15 ENCOUNTER — Inpatient Hospital Stay: Payer: Medicare HMO | Attending: Hematology & Oncology

## 2023-06-15 ENCOUNTER — Inpatient Hospital Stay: Payer: Medicare HMO

## 2023-06-15 ENCOUNTER — Inpatient Hospital Stay: Payer: Medicare HMO | Admitting: Family

## 2023-06-15 ENCOUNTER — Encounter: Payer: Self-pay | Admitting: Family

## 2023-06-15 VITALS — BP 138/59 | HR 84 | Temp 97.8°F | Resp 19 | Ht 69.0 in | Wt 154.4 lb

## 2023-06-15 DIAGNOSIS — C169 Malignant neoplasm of stomach, unspecified: Secondary | ICD-10-CM | POA: Insufficient documentation

## 2023-06-15 DIAGNOSIS — M79606 Pain in leg, unspecified: Secondary | ICD-10-CM | POA: Diagnosis not present

## 2023-06-15 DIAGNOSIS — E032 Hypothyroidism due to medicaments and other exogenous substances: Secondary | ICD-10-CM

## 2023-06-15 DIAGNOSIS — D509 Iron deficiency anemia, unspecified: Secondary | ICD-10-CM

## 2023-06-15 DIAGNOSIS — C160 Malignant neoplasm of cardia: Secondary | ICD-10-CM | POA: Diagnosis not present

## 2023-06-15 DIAGNOSIS — Z1509 Genetic susceptibility to other malignant neoplasm: Secondary | ICD-10-CM | POA: Diagnosis not present

## 2023-06-15 DIAGNOSIS — Z5112 Encounter for antineoplastic immunotherapy: Secondary | ICD-10-CM | POA: Insufficient documentation

## 2023-06-15 DIAGNOSIS — Z79620 Long term (current) use of immunosuppressive biologic: Secondary | ICD-10-CM | POA: Diagnosis not present

## 2023-06-15 DIAGNOSIS — R5383 Other fatigue: Secondary | ICD-10-CM | POA: Diagnosis not present

## 2023-06-15 LAB — CMP (CANCER CENTER ONLY)
ALT: 13 U/L (ref 0–44)
AST: 13 U/L — ABNORMAL LOW (ref 15–41)
Albumin: 3.9 g/dL (ref 3.5–5.0)
Alkaline Phosphatase: 85 U/L (ref 38–126)
Anion gap: 7 (ref 5–15)
BUN: 10 mg/dL (ref 8–23)
CO2: 29 mmol/L (ref 22–32)
Calcium: 9.5 mg/dL (ref 8.9–10.3)
Chloride: 102 mmol/L (ref 98–111)
Creatinine: 1.08 mg/dL (ref 0.61–1.24)
GFR, Estimated: >60 mL/min (ref >60)
Glucose, Bld: 91 mg/dL (ref 70–99)
Potassium: 4 mmol/L (ref 3.5–5.1)
Sodium: 138 mmol/L (ref 135–145)
Total Bilirubin: 0.7 mg/dL (ref 0.0–1.2)
Total Protein: 6.4 g/dL — ABNORMAL LOW (ref 6.5–8.1)

## 2023-06-15 LAB — CBC WITH DIFFERENTIAL (CANCER CENTER ONLY)
Abs Immature Granulocytes: 0.03 10*3/uL (ref 0.00–0.07)
Basophils Absolute: 0.1 10*3/uL (ref 0.0–0.1)
Basophils Relative: 1 %
Eosinophils Absolute: 0.5 10*3/uL (ref 0.0–0.5)
Eosinophils Relative: 6 %
HCT: 38 % — ABNORMAL LOW (ref 39.0–52.0)
Hemoglobin: 12.5 g/dL — ABNORMAL LOW (ref 13.0–17.0)
Immature Granulocytes: 0 %
Lymphocytes Relative: 20 %
Lymphs Abs: 1.7 10*3/uL (ref 0.7–4.0)
MCH: 29.3 pg (ref 26.0–34.0)
MCHC: 32.9 g/dL (ref 30.0–36.0)
MCV: 89 fL (ref 80.0–100.0)
Monocytes Absolute: 1.1 10*3/uL — ABNORMAL HIGH (ref 0.1–1.0)
Monocytes Relative: 13 %
Neutro Abs: 5 10*3/uL (ref 1.7–7.7)
Neutrophils Relative %: 60 %
Platelet Count: 313 10*3/uL (ref 150–400)
RBC: 4.27 MIL/uL (ref 4.22–5.81)
RDW: 13.6 % (ref 11.5–15.5)
WBC Count: 8.5 10*3/uL (ref 4.0–10.5)
nRBC: 0 % (ref 0.0–0.2)

## 2023-06-15 LAB — IRON AND IRON BINDING CAPACITY (CC-WL,HP ONLY)
Iron: 140 ug/dL (ref 45–182)
Saturation Ratios: 42 % — ABNORMAL HIGH (ref 17.9–39.5)
TIBC: 336 ug/dL (ref 250–450)
UIBC: 196 ug/dL (ref 117–376)

## 2023-06-15 LAB — TSH: TSH: 2.711 u[IU]/mL (ref 0.350–4.500)

## 2023-06-15 MED ORDER — SODIUM CHLORIDE 0.9 % IV SOLN
360.0000 mg | INTRAVENOUS | Status: DC
  Administered 2023-06-15: 360 mg via INTRAVENOUS
  Filled 2023-06-15: qty 24

## 2023-06-15 MED ORDER — SODIUM CHLORIDE 0.9 % IV SOLN: Freq: Once | INTRAVENOUS | Status: AC

## 2023-06-15 MED ORDER — HYDROMORPHONE HCL 1 MG/ML IJ SOLN
1.0000 mg | Freq: Once | INTRAMUSCULAR | Status: AC
  Administered 2023-06-15: 1 mg via INTRAVENOUS
  Filled 2023-06-15: qty 1

## 2023-06-15 NOTE — Patient Instructions (Signed)
 [facility] - A DEPT OF [facility]  Discharge Instructions: Thank you for choosing [facility] to provide your oncology and hematology care.   If you have a lab appointment with the Cancer Center, please go directly to the Cancer Center and check in at the registration area.  Wear comfortable clothing and clothing appropriate for easy access to any Portacath or PICC line.   We strive to give you quality time with your provider. You may need to reschedule your appointment if you arrive late (15 or more minutes).  Arriving late affects you and other patients whose appointments are after yours.  Also, if you miss three or more appointments without notifying the office, you may be dismissed from the clinic at the provider's discretion.      For prescription refill requests, have your pharmacy contact our office and allow 72 hours for refills to be completed.    Today you received the following chemotherapy and/or immunotherapy agents Nivolumab    To help prevent nausea and vomiting after your treatment, we encourage you to take your nausea medication as directed.  BELOW ARE SYMPTOMS THAT SHOULD BE REPORTED IMMEDIATELY: *FEVER GREATER THAN 100.4 F (38 C) OR HIGHER *CHILLS OR SWEATING *NAUSEA AND VOMITING THAT IS NOT CONTROLLED WITH YOUR NAUSEA MEDICATION *UNUSUAL SHORTNESS OF BREATH *UNUSUAL BRUISING OR BLEEDING *URINARY PROBLEMS (pain or burning when urinating, or frequent urination) *BOWEL PROBLEMS (unusual diarrhea, constipation, pain near the anus) TENDERNESS IN MOUTH AND THROAT WITH OR WITHOUT PRESENCE OF ULCERS (sore throat, sores in mouth, or a toothache) UNUSUAL RASH, SWELLING OR PAIN  UNUSUAL VAGINAL DISCHARGE OR ITCHING   Items with * indicate a potential emergency and should be followed up as soon as possible or go to the Emergency Department if any problems should occur.  Please show the CHEMOTHERAPY ALERT CARD or IMMUNOTHERAPY  ALERT CARD at check-in to the Emergency Department and triage nurse. Should you have questions after your visit or need to cancel or reschedule your appointment, please contact [facility] - A DEPT OF Eligha [facility]ideg[facility]  [phone] and follow the prompts.  Office hours are 8:00 a.m. to 4:30 p.m. Monday - Friday. Please note that voicemails left after 4:00 p.m. may not be returned until the following business day.  We are closed weekends and major holidays. You have access to a nurse at all times for urgent questions. Please call the main number to the clinic [phone] and follow the prompts.  For any non-urgent questions, you may also contact your provider using MyChart. We now offer e-Visits for anyone 14 and older to request care online for non-urgent symptoms. For details visit [email or web address].   Also download the MyChart app! Go to the app store, search "MyChart", open the app, select [place], and log in with your MyChart username and password.

## 2023-06-15 NOTE — Progress Notes (Signed)
 Hematology and Oncology Follow Up Visit  BURNIE THERIEN [id number] [birth year] 83 y.o. 06/15/2023   Principle Diagnosis:  Stage IIB (T3N0M0) Gastric cancer -- declines surgery -recurrent-  HER2(-)/ BRAF (+)   Current Therapy:        FOLFOX - s/p cycle #4-  start on 09/15/2021 XRT/Xeloda  -- start on 12/07/2021 --completed on 01/12/2022 FOLFIRI/Opdivo  -- st/p cycle #1 on 10/19/2022 -- d/c FOLFIRI on 11/02/2022 --changed Opdivo  to 21-day cycles on 12/22/2022   Interim History:  Mr. Ekstein is here today with his wife for follow-up and treatment. He is symptomatic with fatigue.  TSH is pending. TIBC also added to his lab work today.  Patient verbalized that he is taking synthroid  as prescribed.  No fever, chills, n/v, cough, rash, dizziness, chest pain, palpitations, abdominal pain or changes in bowel or bladder habits.  Mild SOB occasionally with over exertion. This resolves with taking a rest.  He has chronic constipation and takes Mirilax and a stool softener daily.  No blood loss noted. No bruising or petechiae.  Aching in feet and legs unchanged from baseline.  Mild swelling in the left ankle and feet unchanged. No pain at this time.  No falls or syncope.  Appetite and hydration are fair. Weight is improved at 154 lbs.   ECOG Performance Status: 1 - Symptomatic but completely ambulatory  Medications:  Allergies as of 06/15/2023       Reactions   Percocet [oxycodone -acetaminophen ] Nausea And Vomiting   Oxycodone  Other (See Comments)   Doesn't remember   Oxycodone -aspirin Nausea And Vomiting        Medication List        Accurate as of June 15, 2023 10:16 AM. If you have any questions, ask your nurse or doctor.          STOP taking these medications    Heparin  Na (Pork) Lock Flsh PF 100 UNIT/ML Soln Stopped by: Lauraine Pepper   Normal Saline Flush 0.9 % Soln Stopped by: Lauraine Pepper       TAKE these medications    acetaminophen  325 MG tablet Commonly known  as: TYLENOL  Take 650 mg by mouth 2 (two) times daily as needed.   albendazole 200 MG tablet Commonly known as: ALBENZA Take 200 mg by mouth daily.   alum & mag hydroxide-simeth 200-200-20 MG/5ML suspension Commonly known as: MAALOX/MYLANTA Take 30 mLs by mouth daily.   dexamethasone  4 MG tablet Commonly known as: DECADRON  Take 2 tablets (8 mg total) by mouth daily. Start the day after chemotherapy for 2 days. Take with food.   doxylamine (Sleep) 25 MG tablet Commonly known as: UNISOM Take 25 mg by mouth at bedtime as needed.   dronabinol  2.5 MG capsule Commonly known as: MARINOL  Take 1 capsule (2.5 mg total) by mouth 2 (two) times daily before a meal.   DULoxetine  30 MG capsule Commonly known as: Cymbalta  Take 2 capsules (60 mg total) by mouth at bedtime. Take 1 capsule (30 mg) for the 1st week, then increase to 2 capsules thereafter.   ivermectin 3 MG Tabs tablet Commonly known as: STROMECTOL Take by mouth once.   lactulose  10 GM/15ML solution Commonly known as: CHRONULAC  Take 30 mLs (20 g total) by mouth 3 (three) times daily.   levothyroxine  75 MCG tablet Commonly known as: SYNTHROID  TAKE 1 TABLET BY MOUTH EVERY DAY BEFORE BREAKFAST   loperamide  2 MG capsule Commonly known as: IMODIUM  Take 2 tabs by mouth with first loose stool, then 1 tab with each  additional loose stool as needed. Do not exceed 8 tabs in a 24-hour period   megestrol  40 MG/ML suspension Commonly known as: MEGACE  TAKE 10 MLS (400 MG TOTAL) BY MOUTH 2 (TWO) TIMES DAILY.   megestrol  625 MG/5ML suspension Commonly known as: MEGACE  ES Take 5 mLs (625 mg total) by mouth daily.   omeprazole  40 MG capsule Commonly known as: PRILOSEC TAKE 1 CAPSULE (40 MG TOTAL) BY MOUTH DAILY.   ondansetron  8 MG tablet Commonly known as: ZOFRAN  TAKE 1 TABLET BY MOUTH EVERY 8 HOURS AS NEEDED FOR NAUSEA, VOMITING OR REFRACTORY NAUSEA / VOMITING. START ON THE THIRD DAY AFTER CHEMOTHERAPY.   polyethylene glycol  powder 17 GM/SCOOP powder Commonly known as: GlycoLax  Take 17 g by mouth 2 (two) times daily.   prochlorperazine  10 MG tablet Commonly known as: COMPAZINE  Take 1 tablet (10 mg total) by mouth every 6 (six) hours as needed for nausea or vomiting.   temazepam  7.5 MG capsule Commonly known as: RESTORIL  Take 1 capsule (7.5 mg total) by mouth at bedtime as needed for sleep.   traMADol  50 MG tablet Commonly known as: ULTRAM  Take by mouth.        Allergies:  Allergies  Allergen Reactions   Percocet [Oxycodone -Acetaminophen ] Nausea And Vomiting   Oxycodone  Other (See Comments)    Doesn't remember   Oxycodone -Aspirin Nausea And Vomiting    Past Medical History, Surgical history, Social history, and Family History were reviewed and updated.  Review of Systems: All other 10 point review of systems is negative.   Physical Exam:  height is 5' 9 (1.753 m) and weight is 154 lb 6.4 oz (70 kg). His oral temperature is 97.8 F (36.6 C). His blood pressure is 138/59 (abnormal) and his pulse is 84. His respiration is 19 and oxygen saturation is 96%.   Wt Readings from Last 3 Encounters:  06/15/23 154 lb 6.4 oz (70 kg)  05/25/23 153 lb 4 oz (69.5 kg)  05/04/23 144 lb (65.3 kg)    Ocular: Sclerae unicteric, pupils equal, round and reactive to light Ear-nose-throat: Oropharynx clear, dentition fair Lymphatic: No cervical or supraclavicular adenopathy Lungs no rales or rhonchi, good excursion bilaterally Heart regular rate and rhythm, no murmur appreciated Abd soft, nontender, positive bowel sounds MSK no focal spinal tenderness, no joint edema Neuro: non-focal, well-oriented, appropriate affect Breasts: Deferred   Lab Results  Component Value Date   WBC 8.5 06/15/2023   HGB 12.5 (L) 06/15/2023   HCT 38.0 (L) 06/15/2023   MCV 89.0 06/15/2023   PLT 313 06/15/2023   Lab Results  Component Value Date   FERRITIN 74 10/04/2022   IRON  93 10/04/2022   TIBC 279 10/04/2022   UIBC  186 10/04/2022   IRONPCTSAT 33 10/04/2022   Lab Results  Component Value Date   RETICCTPCT 0.7 08/18/2021   RBC 4.27 06/15/2023   No results found for: JONATHAN BONG [facility] Lab Results  Component Value Date   IGGSERUM 931 05/05/2022   IGMSERUM 59 05/05/2022   No results found for: STEPHANY CARLOTA BENSON MARKEL EARLA JOANNIE DOC VICK, SPEI   Chemistry      Component Value Date/Time   NA 139 05/25/2023 0905   NA 139 07/23/2020 0818   K 4.7 05/25/2023 0905   CL 101 05/25/2023 0905   CO2 30 05/25/2023 0905   BUN 21 05/25/2023 0905   BUN 10 07/23/2020 0818   CREATININE 1.32 (H) 05/25/2023 0905   CREATININE 1.06 06/27/2015 9162  Component Value Date/Time   CALCIUM  9.1 05/25/2023 0905   ALKPHOS 85 05/25/2023 0905   AST 17 05/25/2023 0905   ALT 16 05/25/2023 0905   BILITOT 0.6 05/25/2023 0905       Impression and Plan: Mr. Trefry is a very pleasant 83 yo caucasian gentleman with localized gastric cancer. So far, he has been very reluctant to undergo surgery for treatment of this localized disease.  Patient continues to take albendazole/ivermectin.  We will proceed with Opdivo  today as planned.  PET scan ordered and requested for in 2 weeks.  TSH and TIBC pending.  Follow-up in 3 weeks with MD.   Lauraine Pepper, NP 1/8/202510:16 AM

## 2023-06-16 ENCOUNTER — Other Ambulatory Visit: Payer: Self-pay | Admitting: Hematology & Oncology

## 2023-06-16 ENCOUNTER — Other Ambulatory Visit: Payer: Self-pay

## 2023-06-16 DIAGNOSIS — C160 Malignant neoplasm of cardia: Secondary | ICD-10-CM

## 2023-06-20 ENCOUNTER — Encounter: Payer: Self-pay | Admitting: Hematology & Oncology

## 2023-06-20 MED ORDER — TEMAZEPAM 7.5 MG PO CAPS: 7.5000 mg | ORAL_CAPSULE | Freq: Every evening | ORAL | 0 refills | Status: DC | PRN

## 2023-06-27 ENCOUNTER — Encounter (HOSPITAL_COMMUNITY)
Admission: RE | Admit: 2023-06-27 | Discharge: 2023-06-27 | Disposition: A | Discharge status: Home / Self Care | Payer: Medicare HMO | Source: Ambulatory Visit | Attending: Family | Admitting: Family

## 2023-06-27 DIAGNOSIS — C160 Malignant neoplasm of cardia: Secondary | ICD-10-CM | POA: Insufficient documentation

## 2023-06-27 LAB — GLUCOSE, CAPILLARY: Glucose-Capillary: 82 mg/dL (ref 70–99)

## 2023-06-27 MED ORDER — FLUDEOXYGLUCOSE F - 18 (FDG) INJECTION
7.8300 | Freq: Once | INTRAVENOUS | Status: AC | PRN
  Administered 2023-06-27: 7.63 via INTRAVENOUS

## 2023-07-01 ENCOUNTER — Other Ambulatory Visit: Payer: Self-pay

## 2023-07-04 ENCOUNTER — Encounter: Payer: Self-pay | Admitting: *Deleted

## 2023-07-06 ENCOUNTER — Inpatient Hospital Stay (HOSPITAL_BASED_OUTPATIENT_CLINIC_OR_DEPARTMENT_OTHER): Payer: Medicare HMO | Admitting: Hematology & Oncology

## 2023-07-06 ENCOUNTER — Inpatient Hospital Stay: Payer: Medicare HMO

## 2023-07-06 ENCOUNTER — Encounter: Payer: Self-pay | Admitting: Hematology & Oncology

## 2023-07-06 VITALS — BP 125/81 | HR 89 | Temp 97.8°F | Resp 19 | Ht 69.0 in | Wt 156.1 lb

## 2023-07-06 DIAGNOSIS — D509 Iron deficiency anemia, unspecified: Secondary | ICD-10-CM

## 2023-07-06 DIAGNOSIS — C160 Malignant neoplasm of cardia: Secondary | ICD-10-CM

## 2023-07-06 DIAGNOSIS — E032 Hypothyroidism due to medicaments and other exogenous substances: Secondary | ICD-10-CM

## 2023-07-06 DIAGNOSIS — G2581 Restless legs syndrome: Secondary | ICD-10-CM

## 2023-07-06 DIAGNOSIS — Z5112 Encounter for antineoplastic immunotherapy: Secondary | ICD-10-CM | POA: Diagnosis not present

## 2023-07-06 LAB — CMP (CANCER CENTER ONLY)
ALT: 10 U/L (ref 0–44)
AST: 11 U/L — ABNORMAL LOW (ref 15–41)
Albumin: 3.8 g/dL (ref 3.5–5.0)
Alkaline Phosphatase: 74 U/L (ref 38–126)
Anion gap: 6 (ref 5–15)
BUN: 12 mg/dL (ref 8–23)
CO2: 28 mmol/L (ref 22–32)
Calcium: 9.3 mg/dL (ref 8.9–10.3)
Chloride: 102 mmol/L (ref 98–111)
Creatinine: 0.99 mg/dL (ref 0.61–1.24)
GFR, Estimated: >60 mL/min (ref >60)
Glucose, Bld: 114 mg/dL — ABNORMAL HIGH (ref 70–99)
Potassium: 3.8 mmol/L (ref 3.5–5.1)
Sodium: 136 mmol/L (ref 135–145)
Total Bilirubin: 0.6 mg/dL (ref 0.0–1.2)
Total Protein: 6.1 g/dL — ABNORMAL LOW (ref 6.5–8.1)

## 2023-07-06 LAB — CBC WITH DIFFERENTIAL (CANCER CENTER ONLY)
Abs Immature Granulocytes: 0.05 10*3/uL (ref 0.00–0.07)
Basophils Absolute: 0.1 10*3/uL (ref 0.0–0.1)
Basophils Relative: 1 %
Eosinophils Absolute: 0.5 10*3/uL (ref 0.0–0.5)
Eosinophils Relative: 7 %
HCT: 37.5 % — ABNORMAL LOW (ref 39.0–52.0)
Hemoglobin: 12.1 g/dL — ABNORMAL LOW (ref 13.0–17.0)
Immature Granulocytes: 1 %
Lymphocytes Relative: 21 %
Lymphs Abs: 1.8 10*3/uL (ref 0.7–4.0)
MCH: 29.3 pg (ref 26.0–34.0)
MCHC: 32.3 g/dL (ref 30.0–36.0)
MCV: 90.8 fL (ref 80.0–100.0)
Monocytes Absolute: 1.1 10*3/uL — ABNORMAL HIGH (ref 0.1–1.0)
Monocytes Relative: 13 %
Neutro Abs: 4.7 10*3/uL (ref 1.7–7.7)
Neutrophils Relative %: 57 %
Platelet Count: 366 10*3/uL (ref 150–400)
RBC: 4.13 MIL/uL — ABNORMAL LOW (ref 4.22–5.81)
RDW: 13.7 % (ref 11.5–15.5)
WBC Count: 8.3 10*3/uL (ref 4.0–10.5)
nRBC: 0 % (ref 0.0–0.2)

## 2023-07-06 LAB — TSH: TSH: 1.921 u[IU]/mL (ref 0.350–4.500)

## 2023-07-06 LAB — FERRITIN: Ferritin: 84 ng/mL (ref 24–336)

## 2023-07-06 LAB — LACTATE DEHYDROGENASE: LDH: 93 U/L — ABNORMAL LOW (ref 98–192)

## 2023-07-06 MED ORDER — ENCORAFENIB 75 MG PO CAPS: 300.0000 mg | ORAL_CAPSULE | Freq: Every day | ORAL | 5 refills | Status: DC

## 2023-07-06 MED ORDER — HYDROMORPHONE HCL 1 MG/ML IJ SOLN: 1.0000 mg | Freq: Once | INTRAMUSCULAR | Status: DC

## 2023-07-06 MED ORDER — TRAMETINIB DIMETHYL SULFOXIDE 2 MG PO TABS
2.0000 mg | ORAL_TABLET | Freq: Every day | ORAL | 6 refills | Status: DC
  Filled 2023-07-11: qty 30, 30d supply, fill #0

## 2023-07-06 MED ORDER — SODIUM CHLORIDE 0.9 % IV SOLN: Freq: Once | INTRAVENOUS | Status: AC

## 2023-07-06 MED ORDER — DRONABINOL 2.5 MG PO CAPS
2.5000 mg | ORAL_CAPSULE | Freq: Two times a day (BID) | ORAL | 0 refills | Status: DC
  Filled 2023-07-06: qty 60, 30d supply, fill #0

## 2023-07-06 MED ORDER — SODIUM CHLORIDE 0.9 % IV SOLN
20.0000 mg | Freq: Once | INTRAVENOUS | Status: AC
  Administered 2023-07-06: 20 mg via INTRAVENOUS
  Filled 2023-07-06: qty 2

## 2023-07-06 MED ORDER — HYDROMORPHONE HCL 2 MG PO TABS
2.0000 mg | ORAL_TABLET | ORAL | 0 refills | Status: DC | PRN
  Filled 2023-07-06: qty 60, 10d supply, fill #0

## 2023-07-06 MED ORDER — HYDROMORPHONE HCL 1 MG/ML IJ SOLN
1.0000 mg | Freq: Once | INTRAMUSCULAR | Status: AC
  Administered 2023-07-06: 1 mg via INTRAVENOUS
  Filled 2023-07-06: qty 1

## 2023-07-06 NOTE — Progress Notes (Signed)
Hematology and Oncology Follow Up Visit  George Dyer [id number] 18-Dec-1940 83 y.o. 07/06/2023   Principle Diagnosis:  Stage IIB (T3N0M0) Gastric cancer -- declines surgery -recurrent-  HER2(-)/ BRAF (+)   Current Therapy:        FOLFOX - s/p cycle #4-  start on 09/15/2021 XRT/Xeloda -- start on 12/07/2021 --completed on 01/12/2022 FOLFIRI/Opdivo -- st/p cycle #1 on 10/19/2022 -- d/c FOLFIRI on 11/02/2022 --changed Opdivo to 21-day cycles on 12/22/2022 - d/c on 07/06/2023 Encorafenib 300mg  po q day/Trametenib 2 mg po q day   Interim History:  George Dyer is here today with his wife for follow-up.  Unfortunately, it is apparent that his malignancy is worse.  We did do a PET scan on him.  This was done on 06/27/2023.  The tumor is progressing.  There is still no progression outside of the stomach.  The tumor measured 2 x 3 cm.  Had an SUV of 8.9.  Again, there is no disease outside of the stomach.  There may be a borderline subcarinal lymph node but I really do not think that this is malignant.  He still does not want to have surgery.  Her options are very limited.  Thankfully, his tumor is BRAF positive.  As such, we will try him on BRAF inhibitor.  Will try him on Braftovi/Mekinist.  I think this might be something that is reasonable.  He understands that again, her options are not that great.  He does not feel all that great.  He is having more pain in his legs.  I think this is neuropathy.  He is also a little more fatigued.  Again this might be from his tumor.  It also might be from thyroid.  His TSH is 1.9.  He has been on this ivermectin.  This might be causing some of his side effects.  He has had no obvious bleeding.  He has had no issues with diarrhea.  He has had no fever.  His weight is holding steady although he may not be eating as much.  He has had no vomiting.  Currently, I would have said that his performance status is probably ECOG 2.      Medications:   Allergies as of 07/06/2023       Reactions   Percocet [oxycodone-acetaminophen] Nausea And Vomiting   Oxycodone Other (See Comments)   Doesn't remember   Oxycodone-aspirin Nausea And Vomiting        Medication List        Accurate as of July 06, 2023  9:39 AM. If you have any questions, ask your nurse or doctor.          acetaminophen 325 MG tablet Commonly known as: TYLENOL Take 650 mg by mouth 2 (two) times daily as needed.   albendazole 200 MG tablet Commonly known as: ALBENZA Take 200 mg by mouth daily.   alum & mag hydroxide-simeth 200-200-20 MG/5ML suspension Commonly known as: MAALOX/MYLANTA Take 30 mLs by mouth daily.   dexamethasone 4 MG tablet Commonly known as: DECADRON Take 2 tablets (8 mg total) by mouth daily. Start the day after chemotherapy for 2 days. Take with food.   doxylamine (Sleep) 25 MG tablet Commonly known as: UNISOM Take 25 mg by mouth at bedtime as needed.   dronabinol 2.5 MG capsule Commonly known as: MARINOL Take 1 capsule (2.5 mg total) by mouth 2 (two) times daily before a meal.   DULoxetine 30 MG capsule Commonly known as: Cymbalta Take  2 capsules (60 mg total) by mouth at bedtime. Take 1 capsule (30 mg) for the 1st week, then increase to 2 capsules thereafter.   fentaNYL 25 MCG/HR Commonly known as: DURAGESIC Place 1 patch onto the skin every 3 (three) days.   ivermectin 3 MG Tabs tablet Commonly known as: STROMECTOL Take by mouth once.   lactulose 10 GM/15ML solution Commonly known as: CHRONULAC Take 30 mLs (20 g total) by mouth 3 (three) times daily.   levothyroxine 75 MCG tablet Commonly known as: SYNTHROID TAKE 1 TABLET BY MOUTH EVERY DAY BEFORE BREAKFAST   loperamide 2 MG capsule Commonly known as: IMODIUM Take 2 tabs by mouth with first loose stool, then 1 tab with each additional loose stool as needed. Do not exceed 8 tabs in a 24-hour period   megestrol 40 MG/ML suspension Commonly known as:  MEGACE TAKE 10 MLS (400 MG TOTAL) BY MOUTH 2 (TWO) TIMES DAILY.   megestrol 625 MG/5ML suspension Commonly known as: MEGACE ES Take 5 mLs (625 mg total) by mouth daily.   omeprazole 40 MG capsule Commonly known as: PRILOSEC TAKE 1 CAPSULE (40 MG TOTAL) BY MOUTH DAILY.   ondansetron 8 MG tablet Commonly known as: ZOFRAN TAKE 1 TABLET BY MOUTH EVERY 8 HOURS AS NEEDED FOR NAUSEA, VOMITING OR REFRACTORY NAUSEA / VOMITING. START ON THE THIRD DAY AFTER CHEMOTHERAPY.   polyethylene glycol powder 17 GM/SCOOP powder Commonly known as: GlycoLax Take 17 g by mouth 2 (two) times daily.   prochlorperazine 10 MG tablet Commonly known as: COMPAZINE Take 1 tablet (10 mg total) by mouth every 6 (six) hours as needed for nausea or vomiting.   temazepam 7.5 MG capsule Commonly known as: RESTORIL Take 1 capsule (7.5 mg total) by mouth at bedtime as needed for sleep.   traMADol 50 MG tablet Commonly known as: ULTRAM Take by mouth.        Allergies:  Allergies  Allergen Reactions   Percocet [Oxycodone-Acetaminophen] Nausea And Vomiting   Oxycodone Other (See Comments)    Doesn't remember   Oxycodone-Aspirin Nausea And Vomiting    Past Medical History, Surgical history, Social history, and Family History were reviewed and updated.  Review of Systems: Review of Systems  Constitutional: Negative.   HENT: Negative.    Eyes: Negative.   Respiratory: Negative.    Cardiovascular: Negative.   Gastrointestinal:  Positive for nausea and vomiting.  Genitourinary: Negative.   Musculoskeletal: Negative.   Skin: Negative.   Neurological: Negative.   Endo/Heme/Allergies: Negative.   Psychiatric/Behavioral: Negative.     Marland Kitchen   Physical Exam: Vital signs show temperature of 97.8.  Pulse 89.  Blood pressure 125/81.  Weight is 156 pounds.    Wt Readings from Last 3 Encounters:  07/06/23 156 lb 1.9 oz (70.8 kg)  06/15/23 154 lb 6.4 oz (70 kg)  05/25/23 153 lb 4 oz (69.5 kg)    Physical  Exam Vitals reviewed.  HENT:     Head: Normocephalic and atraumatic.  Eyes:     Pupils: Pupils are equal, round, and reactive to light.  Cardiovascular:     Rate and Rhythm: Normal rate and regular rhythm.     Heart sounds: Normal heart sounds.  Pulmonary:     Effort: Pulmonary effort is normal.     Breath sounds: Normal breath sounds.  Abdominal:     General: Bowel sounds are normal.     Palpations: Abdomen is soft.  Musculoskeletal:        General: No  tenderness or deformity. Normal range of motion.     Cervical back: Normal range of motion.  Lymphadenopathy:     Cervical: No cervical adenopathy.  Skin:    General: Skin is warm and dry.     Findings: No erythema or rash.  Neurological:     Mental Status: He is alert and oriented to person, place, and time.  Psychiatric:        Behavior: Behavior normal.        Thought Content: Thought content normal.        Judgment: Judgment normal.     Lab Results  Component Value Date   WBC 8.3 07/06/2023   HGB 12.1 (L) 07/06/2023   HCT 37.5 (L) 07/06/2023   MCV 90.8 07/06/2023   PLT 366 07/06/2023   Lab Results  Component Value Date   FERRITIN 74 10/04/2022   IRON 140 06/15/2023   TIBC 336 06/15/2023   UIBC 196 06/15/2023   IRONPCTSAT 42 (H) 06/15/2023   Lab Results  Component Value Date   RETICCTPCT 0.7 08/18/2021   RBC 4.13 (L) 07/06/2023   No results found for: "KPAFRELGTCHN", "LAMBDASER", "KAPLAMBRATIO" Lab Results  Component Value Date   IGGSERUM 931 05/05/2022   IGMSERUM 59 05/05/2022   No results found for: "TOTALPROTELP", "ALBUMINELP", "A1GS", "A2GS", "BETS", "BETA2SER", "GAMS", "MSPIKE", "SPEI"   Chemistry      Component Value Date/Time   NA 136 07/06/2023 0902   NA 139 07/23/2020 0818   K 3.8 07/06/2023 0902   CL 102 07/06/2023 0902   CO2 28 07/06/2023 0902   BUN 12 07/06/2023 0902   BUN 10 07/23/2020 0818   CREATININE 0.99 07/06/2023 0902   CREATININE 1.06 06/27/2015 0837      Component Value  Date/Time   CALCIUM 9.3 07/06/2023 0902   ALKPHOS 74 07/06/2023 0902   AST 11 (L) 07/06/2023 0902   ALT 10 07/06/2023 0902   BILITOT 0.6 07/06/2023 0902       Impression and Plan: George Dyer is a very pleasant 83 yo caucasian gentleman with localized gastric cancer.  His cancer is progressing.  We had him on immunotherapy.  I think this is really not doing much for his at this point.  Again, his tumor is BRAF positive.  We will see about getting him on Mekinist/Braftovi.  I think this is a reasonable approach.  Hopefully, insurance will cover this.  I will try him on some Dilaudid for the leg pain.  He wants to have a referral to vascular surgery.  I am really not sure what they are going to do perform it.  He is not operative candidate for my point of view.  However, it would probably give him some peace of mind.  I would like to try to get him back to see me in about 3 to 4 weeks.  I probably would do another PET scan on him in about 8 weeks or so.  He certainly knows to come back sooner if there is any problems.     Josph Macho, MD 1/29/20259:39 AM

## 2023-07-06 NOTE — Patient Instructions (Signed)

## 2023-07-07 ENCOUNTER — Other Ambulatory Visit: Payer: Self-pay

## 2023-07-07 ENCOUNTER — Telehealth: Payer: Self-pay | Admitting: Pharmacist

## 2023-07-07 ENCOUNTER — Other Ambulatory Visit (HOSPITAL_COMMUNITY): Payer: Self-pay

## 2023-07-07 ENCOUNTER — Telehealth: Payer: Self-pay

## 2023-07-07 DIAGNOSIS — C160 Malignant neoplasm of cardia: Secondary | ICD-10-CM

## 2023-07-07 MED ORDER — DABRAFENIB MESYLATE 75 MG PO CAPS
150.0000 mg | ORAL_CAPSULE | Freq: Two times a day (BID) | ORAL | 5 refills | Status: DC
  Filled 2023-07-11: qty 120, 30d supply, fill #0

## 2023-07-07 NOTE — Telephone Encounter (Signed)
Oral Oncology Patient Advocate Encounter  Prior Authorization for Fabio Neighbors has been approved.    PA# [id number]  Effective dates: 06/08/23 through 06/06/24  Patients co-pay is $12.15.    Ardeen Fillers, CPhT Oncology Pharmacy Patient Advocate  [facility]  [phone] (phone) [phone] (fax) 07/07/2023 11:04 AM

## 2023-07-07 NOTE — Telephone Encounter (Signed)
Oral Oncology Patient Advocate Encounter  New authorization   Received notification that prior authorization for Tafinlar is required.   PA submitted on 07/07/23  Key [id number]  Status is pending     Ardeen Fillers, CPhT Oncology Pharmacy Patient Advocate  [facility]  [phone] (phone) [phone] (fax) 07/07/2023 10:16 AM

## 2023-07-07 NOTE — Telephone Encounter (Signed)
Oral Oncology Patient Advocate Encounter  New authorization   Received notification that prior authorization for Mekinist is required.   PA submitted on 07/07/23  Key [id number]  Status is pending     Ardeen Fillers, CPhT Oncology Pharmacy Patient Advocate  [facility]  [phone] (phone) [phone] (fax) 07/07/2023 10:22 AM

## 2023-07-07 NOTE — Telephone Encounter (Signed)
Oral Oncology Patient Advocate Encounter  Received notification that the request for prior authorization for Mekinist has been denied due to :  "Patient does NOT have unresectable or metastatic disease."      Ardeen Fillers, CPhT Oncology Pharmacy Patient Advocate  [facility]  [phone] (phone) [phone] (fax) 07/07/2023 11:13 AM

## 2023-07-07 NOTE — Telephone Encounter (Signed)
Oral Oncology Pharmacist Encounter  Received new prescription for Tafinlar (dabrafenib) and Mekinist (trametinib) for the treatment of gastric cancer, BRAF mutated, planned duration until disease progression or unacceptable drug toxicity.  CBC w/ Diff and CMP from 07/06/23 assessed, no relevant lab abnormalities requiring baseline dose adjustment required at this time. Prescription dose and frequency assessed for appropriateness. Per staff message with Dr. Myna Hidalgo on 07/07/23, we will switch out the initially planned encorafenib with dabrafenib instead. Trametinib will stay as part of treatment plan as previously discussed.   Current medication list in Epic reviewed, DDIs with Tafinlar identified: Category C drug-drug interaction between Tafinlar and Dronabinol - Tafinlar, a moderate CYP3A4 inducer may decrease serum concentrations of dronabinol. Recommend patient monitor for decreased efficacy of dronabinol - no changes in therapy warranted at this time.   Evaluated chart and no patient barriers to medication adherence noted.   Prescription has been e-scribed to the [facility] for benefits analysis and approval.  Oral Oncology Clinic will continue to follow for insurance authorization, copayment issues, initial counseling and start date.  Sherry Ruffing, PharmD, BCPS, BCOP Hematology/Oncology Clinical Pharmacist [facility] and [facility] Oral Chemotherapy Navigation Clinics [phone] 07/07/2023 10:31 AM

## 2023-07-08 ENCOUNTER — Other Ambulatory Visit (HOSPITAL_COMMUNITY): Payer: Self-pay

## 2023-07-08 NOTE — Telephone Encounter (Signed)
Oral Oncology Patient Advocate Encounter  Prior Authorization for Mekinist has been approved.    PA# [id number]  Effective dates: 06/08/23 through 06/06/24  Patients co-pay is $12.15.    Ardeen Fillers, CPhT Oncology Pharmacy Patient Advocate  [facility]  [phone] (phone) [phone] (fax) 07/08/2023 3:31 PM

## 2023-07-08 NOTE — Telephone Encounter (Signed)
Oral Oncology Patient Advocate Encounter   An urgent appeal for the prior authorization denial of Mekinist has been started by the pharmacist on 07/07/23.  Urgent Appeal e-faxed to [facility] at [phone].   This encounter will continue to be updated until final appeal determination.      Ardeen Fillers, CPhT Oncology Pharmacy Patient Advocate  [facility]  [phone] (phone) [phone] (fax) 07/08/2023 8:38 AM

## 2023-07-11 ENCOUNTER — Other Ambulatory Visit: Payer: Self-pay

## 2023-07-11 NOTE — Progress Notes (Signed)
Oral Chemotherapy Pharmacist Encounter  Patient was counseled under telephone encounter from 07/07/23.  Sherry Ruffing, PharmD, BCPS, BCOP Hematology/Oncology Clinical Pharmacist [facility] Oral Chemotherapy Navigation Clinics [phone] 07/11/2023 10:34 AM

## 2023-07-11 NOTE — Telephone Encounter (Signed)
Patient successfully OnBoarded and drug education provided by pharmacist. Medication scheduled to be shipped on Tuesday, 07/12/23, for delivery on Wednesday, 07/13/23, from [facility] to patient's address. Patient also knows to call me at [phone] with any questions or concerns regarding receiving medication or if there is any unexpected change in co-pay.   Ardeen Fillers, CPhT Oncology Pharmacy Patient Advocate  [facility]  [phone] (phone) [phone] (fax) 07/11/2023 10:28 AM

## 2023-07-11 NOTE — Telephone Encounter (Signed)
Patient successfully OnBoarded and drug education provided by pharmacist. Medication scheduled to be shipped on Tuesday, 07/12/23, for delivery on Wednesday, 07/13/23, from [facility] to patient's address. Patient also knows to call me at [phone] with any questions or concerns regarding receiving medication or if there is any unexpected change in co-pay.    Ardeen Fillers, CPhT Oncology Pharmacy Patient Advocate  [facility]  [phone] (phone) [phone] (fax) 07/11/2023 10:29 AM

## 2023-07-11 NOTE — Progress Notes (Signed)
Specialty Pharmacy Initial Fill Coordination Note  George Dyer is a 83 y.o. male contacted today regarding initial fill of specialty medication(s) Dabrafenib Mesylate (TAFINLAR); Trametinib Dimethyl Sulfoxide (MEKINIST)  Patient requested Delivery   Delivery date: 07/13/23   Verified address: [address], [place], [place] [address]  Medication will be filled on 07/12/23.   Patient is aware of $24.30 copayment (total). Bill to [place].    Ardeen Fillers, CPhT Oncology Pharmacy Patient Advocate  [facility]  [phone] (phone) [phone] (fax) 07/11/2023 10:26 AM

## 2023-07-11 NOTE — Telephone Encounter (Signed)
Oral Chemotherapy Pharmacist Encounter   I spoke with patient's wife, Dejohn Ibarra, for overview of: Tafinlar and Mekinist combination for the treatment of gastric cancer, BRAF mutated, planned duration until disease progression or unacceptable drug toxicity.   Counseled patient's wife on administration, dosing, side effects, monitoring, drug-food interactions, safe handling, storage, and disposal.  Patient will take Tafinlar 75mg  capsules, 2 capsules (150mg ) by mouth twice daily.   Patient will take Mekinist 2mg  tablets,1 tablet by mouth once daily.   These will be taken on an empty stomach, 1 hour before or 2 hours after a meal.   Patient will take Tafinlar and Mekinist in the morning before breakfast, and will take Tafinlar again in the evening before bed, 2 hours after dinner.  Tafinlar and Mekinist start date: 07/14/23   Adverse effects include but are not limited to: drug fever, rash, nausea, vomiting, diarrhea, peripheral edema, decreased LVEF, hyperglycemia, and pulmonary toxicity.  Fever: Patient's wife instructed that febrile reactions can occur with Tafinlar and Mekinist therapy. Patient's wife instructed to interrupt therapy for any fever over 100.32F and to alert the office. Diarrhea: Patient will obtain anti diarrheal and alert the office of 4 or more loose stools above baseline.  Reviewed with patient importance of keeping a medication schedule and plan for any missed doses. No barriers to medication adherence identified.  Medication reconciliation performed and medication/allergy list updated.  All questions answered.  Patient's wife voiced understanding and appreciation.   Medication education handout placed in mail for patient and patient's wife. Patient and patient's wife know to call the office with questions or concerns. Oral Chemotherapy Clinic phone number provided.    Sherry Ruffing, PharmD, BCPS, BCOP Hematology/Oncology Clinical Pharmacist [facility] and  [facility] Oral Chemotherapy Navigation Clinics [phone] 07/11/2023 10:31 AM

## 2023-07-13 ENCOUNTER — Other Ambulatory Visit: Payer: Self-pay | Admitting: Physical Medicine and Rehabilitation

## 2023-07-18 ENCOUNTER — Other Ambulatory Visit (HOSPITAL_COMMUNITY): Payer: Self-pay

## 2023-07-19 ENCOUNTER — Encounter: Payer: Self-pay | Admitting: Hematology & Oncology

## 2023-07-19 ENCOUNTER — Other Ambulatory Visit: Payer: Self-pay | Admitting: Hematology & Oncology

## 2023-07-19 DIAGNOSIS — C160 Malignant neoplasm of cardia: Secondary | ICD-10-CM

## 2023-07-19 MED ORDER — TEMAZEPAM 7.5 MG PO CAPS: 7.5000 mg | ORAL_CAPSULE | Freq: Every evening | ORAL | 0 refills | Status: DC | PRN

## 2023-07-29 ENCOUNTER — Inpatient Hospital Stay: Payer: Medicare HMO

## 2023-07-29 ENCOUNTER — Other Ambulatory Visit: Payer: Self-pay

## 2023-07-29 ENCOUNTER — Encounter: Payer: Self-pay | Admitting: *Deleted

## 2023-07-29 ENCOUNTER — Inpatient Hospital Stay (HOSPITAL_BASED_OUTPATIENT_CLINIC_OR_DEPARTMENT_OTHER): Payer: Medicare HMO | Admitting: Hematology & Oncology

## 2023-07-29 ENCOUNTER — Inpatient Hospital Stay: Payer: Medicare HMO | Attending: Hematology & Oncology

## 2023-07-29 ENCOUNTER — Encounter: Payer: Self-pay | Admitting: Hematology & Oncology

## 2023-07-29 VITALS — BP 148/80 | HR 86

## 2023-07-29 VITALS — BP 130/64 | HR 85 | Temp 98.5°F | Resp 20 | Ht 69.0 in | Wt 151.0 lb

## 2023-07-29 DIAGNOSIS — E032 Hypothyroidism due to medicaments and other exogenous substances: Secondary | ICD-10-CM

## 2023-07-29 DIAGNOSIS — C160 Malignant neoplasm of cardia: Secondary | ICD-10-CM

## 2023-07-29 DIAGNOSIS — G63 Polyneuropathy in diseases classified elsewhere: Secondary | ICD-10-CM

## 2023-07-29 DIAGNOSIS — C169 Malignant neoplasm of stomach, unspecified: Secondary | ICD-10-CM | POA: Diagnosis present

## 2023-07-29 DIAGNOSIS — R112 Nausea with vomiting, unspecified: Secondary | ICD-10-CM

## 2023-07-29 DIAGNOSIS — K1230 Oral mucositis (ulcerative), unspecified: Secondary | ICD-10-CM | POA: Diagnosis present

## 2023-07-29 DIAGNOSIS — M79604 Pain in right leg: Secondary | ICD-10-CM

## 2023-07-29 DIAGNOSIS — R350 Frequency of micturition: Secondary | ICD-10-CM

## 2023-07-29 DIAGNOSIS — Z95828 Presence of other vascular implants and grafts: Secondary | ICD-10-CM

## 2023-07-29 DIAGNOSIS — E860 Dehydration: Secondary | ICD-10-CM

## 2023-07-29 DIAGNOSIS — Z452 Encounter for adjustment and management of vascular access device: Secondary | ICD-10-CM

## 2023-07-29 DIAGNOSIS — E038 Other specified hypothyroidism: Secondary | ICD-10-CM

## 2023-07-29 DIAGNOSIS — G2581 Restless legs syndrome: Secondary | ICD-10-CM

## 2023-07-29 DIAGNOSIS — C162 Malignant neoplasm of body of stomach: Secondary | ICD-10-CM

## 2023-07-29 DIAGNOSIS — Z7189 Other specified counseling: Secondary | ICD-10-CM

## 2023-07-29 DIAGNOSIS — D509 Iron deficiency anemia, unspecified: Secondary | ICD-10-CM

## 2023-07-29 LAB — CBC WITH DIFFERENTIAL (CANCER CENTER ONLY)
Abs Immature Granulocytes: 0.11 10*3/uL — ABNORMAL HIGH (ref 0.00–0.07)
Basophils Absolute: 0 10*3/uL (ref 0.0–0.1)
Basophils Relative: 0 %
Eosinophils Absolute: 0 10*3/uL (ref 0.0–0.5)
Eosinophils Relative: 0 %
HCT: 36.8 % — ABNORMAL LOW (ref 39.0–52.0)
Hemoglobin: 11.9 g/dL — ABNORMAL LOW (ref 13.0–17.0)
Immature Granulocytes: 2 %
Lymphocytes Relative: 18 %
Lymphs Abs: 1.3 10*3/uL (ref 0.7–4.0)
MCH: 28.9 pg (ref 26.0–34.0)
MCHC: 32.3 g/dL (ref 30.0–36.0)
MCV: 89.3 fL (ref 80.0–100.0)
Monocytes Absolute: 0.7 10*3/uL (ref 0.1–1.0)
Monocytes Relative: 10 %
Neutro Abs: 5 10*3/uL (ref 1.7–7.7)
Neutrophils Relative %: 70 %
Platelet Count: 347 10*3/uL (ref 150–400)
RBC: 4.12 MIL/uL — ABNORMAL LOW (ref 4.22–5.81)
RDW: 13.6 % (ref 11.5–15.5)
WBC Count: 7.2 10*3/uL (ref 4.0–10.5)
nRBC: 0 % (ref 0.0–0.2)

## 2023-07-29 LAB — CMP (CANCER CENTER ONLY)
ALT: 43 U/L (ref 0–44)
AST: 46 U/L — ABNORMAL HIGH (ref 15–41)
Albumin: 3.3 g/dL — ABNORMAL LOW (ref 3.5–5.0)
Alkaline Phosphatase: 465 U/L — ABNORMAL HIGH (ref 38–126)
Anion gap: 6 (ref 5–15)
BUN: 32 mg/dL — ABNORMAL HIGH (ref 8–23)
CO2: 32 mmol/L (ref 22–32)
Calcium: 9.3 mg/dL (ref 8.9–10.3)
Chloride: 95 mmol/L — ABNORMAL LOW (ref 98–111)
Creatinine: 1.03 mg/dL (ref 0.61–1.24)
GFR, Estimated: >60 mL/min (ref >60)
Glucose, Bld: 110 mg/dL — ABNORMAL HIGH (ref 70–99)
Potassium: 4.5 mmol/L (ref 3.5–5.1)
Sodium: 133 mmol/L — ABNORMAL LOW (ref 135–145)
Total Bilirubin: 0.3 mg/dL (ref 0.0–1.2)
Total Protein: 5.8 g/dL — ABNORMAL LOW (ref 6.5–8.1)

## 2023-07-29 LAB — IRON AND IRON BINDING CAPACITY (CC-WL,HP ONLY)
Iron: 26 ug/dL — ABNORMAL LOW (ref 45–182)
Saturation Ratios: 12 % — ABNORMAL LOW (ref 17.9–39.5)
TIBC: 214 ug/dL — ABNORMAL LOW (ref 250–450)
UIBC: 188 ug/dL (ref 117–376)

## 2023-07-29 LAB — RETICULOCYTES
Immature Retic Fract: 7.1 % (ref 2.3–15.9)
RBC.: 4.17 MIL/uL — ABNORMAL LOW (ref 4.22–5.81)
Retic Count, Absolute: 16.6 10*3/uL — ABNORMAL LOW (ref 19.0–186.0)
Retic Ct Pct: 0.4 % (ref 0.4–3.1)

## 2023-07-29 LAB — VITAMIN B12: Vitamin B-12: 791 pg/mL (ref 180–914)

## 2023-07-29 LAB — SAMPLE TO BLOOD BANK

## 2023-07-29 LAB — PREALBUMIN: Prealbumin: 14 mg/dL — ABNORMAL LOW (ref 18–38)

## 2023-07-29 LAB — FERRITIN: Ferritin: 959 ng/mL — ABNORMAL HIGH (ref 24–336)

## 2023-07-29 MED ORDER — ACYCLOVIR 200 MG/5ML PO SUSP: 400.0000 mg | Freq: Four times a day (QID) | ORAL | 3 refills | Status: DC

## 2023-07-29 MED ORDER — FAMOTIDINE IN NACL 20-0.9 MG/50ML-% IV SOLN
20.0000 mg | INTRAVENOUS | Status: AC
  Administered 2023-07-29 (×2): 20 mg via INTRAVENOUS
  Filled 2023-07-29: qty 50

## 2023-07-29 MED ORDER — FLUCONAZOLE 40 MG/ML PO SUSR: 200.0000 mg | Freq: Every day | ORAL | 2 refills | Status: DC

## 2023-07-29 MED ORDER — HYDROMORPHONE HCL 1 MG/ML IJ SOLN
1.0000 mg | Freq: Once | INTRAMUSCULAR | Status: AC
  Administered 2023-07-29: 1 mg via INTRAVENOUS
  Filled 2023-07-29: qty 1

## 2023-07-29 MED ORDER — CHLORHEXIDINE GLUCONATE 0.12 % MT SOLN: 15.0000 mL | Freq: Four times a day (QID) | OROMUCOSAL | 0 refills | Status: DC

## 2023-07-29 MED ORDER — DEXTROSE 5 % IV SOLN
2.0000 g | Freq: Once | INTRAVENOUS | Status: AC
  Administered 2023-07-29: 2 g via INTRAVENOUS
  Filled 2023-07-29: qty 20

## 2023-07-29 MED ORDER — SODIUM CHLORIDE 0.9 % IV SOLN: INTRAVENOUS | Status: AC

## 2023-07-29 MED ORDER — SODIUM CHLORIDE 0.9 % IV SOLN: 40.0000 mg | Freq: Once | INTRAVENOUS | Status: DC

## 2023-07-29 NOTE — Progress Notes (Signed)
 Per Dr Myna Hidalgo, request for [facility] testing sent on specimen [id number] DOS 04/06/2023. Request sent to [facility].   Oncology Nurse Navigator Documentation     07/29/2023    3:45 PM  Oncology Nurse Navigator Flowsheets  Navigator Location [facility]  Navigator Encounter Type Molecular Studies  Patient Visit Type MedOnc  Treatment Phase Active Tx  Barriers/Navigation Needs Coordination of Care  Interventions Coordination of Care  Acuity Level 2-Minimal Needs (1-2 Barriers Identified)  Coordination of Care Pathology  Time Spent with Patient 30

## 2023-07-29 NOTE — Progress Notes (Signed)
 Hematology and Oncology Follow Up Visit  George Dyer [id number] 1940/09/01 83 y.o. 07/29/2023   Principle Diagnosis:  Stage IIB (T3N0M0) Gastric cancer -- declines surgery -recurrent-  HER2(-)/ BRAF (+)   Current Therapy:        FOLFOX - s/p cycle #4-  start on 09/15/2021 XRT/Xeloda -- start on 12/07/2021 --completed on 01/12/2022 FOLFIRI/Opdivo -- st/p cycle #1 on 10/19/2022 -- d/c FOLFIRI on 11/02/2022 --changed Opdivo to 21-day cycles on 12/22/2022 - d/c on 07/06/2023 Encorafenib 300mg  po q day/Trametenib 2 mg po q day   Interim History:  George Dyer is here today with his wife and family.  I am really not surprised that George Dyer is doing poorly.  George Dyer has a horrible mucositis.  I had to believe this is probably from the encorafinib and the TRAMETINIB that I triedy to get him on for his gastric cancer.  Apparently George Dyer was only took this for about a week or so.  According to his wife, George Dyer apparently has had a temperature over 104 degrees.  She refused to go to the hospital.  I am really not surprised by this.  George Dyer is unable to eat.  Again, George Dyer has severe mucositis.  I am just surprised that George Dyer has this.  I have never seen this with the targeted therapy.  Then again, George Dyer has been taking complementary treatments.  I think George Dyer stopped taking these.  George Dyer also has horrible restless legs.  George Dyer is on morphine for this at home.  It is hard to figure out exactly what George Dyer is taking at home.  I went over his medication list with his family to try to get a better sense as to what George Dyer is on at home.  Clearly, this mucositis is his real problem right now.  Because this, George Dyer is really not able to eat or drink much.  I will have him on some antibiotics.  I will put him on a antiviral and antifungal.  I will put him on Peridex mouth rinse.  George Dyer is lost 5 pounds since we last saw him.  His family had quite a few questions about his treatment options.  1 question was whether or not George Dyer can go down to the [facility] [place] in [place].  I still have no problems with him going down there.  I am not sure exactly what they would offer him.  We could certainly test him for the Claudin 18 abnormality.  However if George Dyer has this, this is only used with chemotherapy.  I am not sure how George Dyer would do with chemotherapy since George Dyer really has had a hard time with anything we have given him.  I must say that George Dyer is not the most patient the world.  George Dyer is oftentimes will just stop taking something every couple days and George Dyer does not think it is working.  I told him that George Dyer really needs to be more patient.  For right now, we really have to get this mucositis under better control.  I will have her get some IV fluid.  I will give him some IV pain medication.  I will also give him some IV Pepcid to try to help.  And also gave him some IV Rocephin.  George Dyer says George Dyer is having no abdominal pain.  George Dyer is not having diarrhea.  Again George Dyer has a restless legs.  Currently, I would say his performance status is probably ECOG 2, at best.     Medications:  Allergies as of 07/29/2023       Reactions   Percocet [oxycodone-acetaminophen] Nausea And Vomiting   Oxycodone Other (See Comments)   Doesn't remember   Oxycodone-aspirin Nausea And Vomiting        Medication List        Accurate as of July 29, 2023  1:21 PM. If you have any questions, ask your nurse or doctor.          acetaminophen 325 MG tablet Commonly known as: TYLENOL Take 650 mg by mouth 2 (two) times daily as needed.   alum & mag hydroxide-simeth 200-200-20 MG/5ML suspension Commonly known as: MAALOX/MYLANTA Take 30 mLs by mouth daily.   doxylamine (Sleep) 25 MG tablet Commonly known as: UNISOM Take 25 mg by mouth at bedtime as needed.   dronabinol 2.5 MG capsule Commonly known as: MARINOL Take 1 capsule (2.5 mg total) by mouth 2 (two) times daily before a meal.   DULoxetine 30 MG capsule Commonly known as: Cymbalta Take 2 capsules  (60 mg total) by mouth at bedtime. Take 1 capsule (30 mg) for the 1st week, then increase to 2 capsules thereafter.   HYDROmorphone 2 MG tablet Commonly known as: Dilaudid Take 1 tablet (2 mg total) by mouth every 4 (four) hours as needed for severe pain (pain score 7-10).   lactulose 10 GM/15ML solution Commonly known as: CHRONULAC Take 30 mLs (20 g total) by mouth 3 (three) times daily.   levothyroxine 75 MCG tablet Commonly known as: SYNTHROID TAKE 1 TABLET BY MOUTH EVERY DAY BEFORE BREAKFAST   megestrol 40 MG/ML suspension Commonly known as: MEGACE TAKE 10 MLS (400 MG TOTAL) BY MOUTH 2 (TWO) TIMES DAILY.   megestrol 625 MG/5ML suspension Commonly known as: MEGACE ES Take 5 mLs (625 mg total) by mouth daily.   Mekinist 2 MG tablet Generic drug: trametinib dimethyl sulfoxide Take 1 tablet (2 mg total) by mouth daily. Take 1 hour before or 2 hours after a meal.   omeprazole 40 MG capsule Commonly known as: PRILOSEC TAKE 1 CAPSULE (40 MG TOTAL) BY MOUTH DAILY.   polyethylene glycol powder 17 GM/SCOOP powder Commonly known as: GlycoLax Take 17 g by mouth 2 (two) times daily.   sucralfate 1 GM/10ML suspension Commonly known as: CARAFATE Take 1 g by mouth 4 (four) times daily.   Tafinlar 75 MG capsule Generic drug: dabrafenib mesylate Take 2 capsules (150 mg total) by mouth 2 (two) times daily. Take on an empty stomach 1 hour before or 2 hours after meals.   temazepam 7.5 MG capsule Commonly known as: RESTORIL Take 1 capsule (7.5 mg total) by mouth at bedtime as needed for sleep.        Allergies:  Allergies  Allergen Reactions   Percocet [Oxycodone-Acetaminophen] Nausea And Vomiting   Oxycodone Other (See Comments)    Doesn't remember   Oxycodone-Aspirin Nausea And Vomiting    Past Medical History, Surgical history, Social history, and Family History were reviewed and updated.  Review of Systems: Review of Systems  Constitutional: Negative.   HENT:  Negative.    Eyes: Negative.   Respiratory: Negative.    Cardiovascular: Negative.   Gastrointestinal:  Positive for nausea and vomiting.  Genitourinary: Negative.   Musculoskeletal: Negative.   Skin: Negative.   Neurological: Negative.   Endo/Heme/Allergies: Negative.   Psychiatric/Behavioral: Negative.     Marland Kitchen   Physical Exam: Vital signs show temperature of 98.5.  Pulse 85.  Blood pressure 130/64.  Weight is 151 pounds.  Wt Readings from Last 3 Encounters:  07/29/23 151 lb (68.5 kg)  07/06/23 156 lb 1.9 oz (70.8 kg)  06/15/23 154 lb 6.4 oz (70 kg)    Physical Exam Vitals reviewed.  HENT:     Head: Normocephalic and atraumatic.     Mouth/Throat:     Comments: Exam shows significant mucositis.  George Dyer has ulcerations.  George Dyer has a lot of erythema.  George Dyer has some dryness. Eyes:     Pupils: Pupils are equal, round, and reactive to light.  Cardiovascular:     Rate and Rhythm: Normal rate and regular rhythm.     Heart sounds: Normal heart sounds.  Pulmonary:     Effort: Pulmonary effort is normal.     Breath sounds: Normal breath sounds.  Abdominal:     General: Bowel sounds are normal.     Palpations: Abdomen is soft.  Musculoskeletal:        General: No tenderness or deformity. Normal range of motion.     Cervical back: Normal range of motion.  Lymphadenopathy:     Cervical: No cervical adenopathy.  Skin:    General: Skin is warm and dry.     Findings: No erythema or rash.  Neurological:     Mental Status: George Dyer is alert and oriented to person, place, and time.  Psychiatric:        Behavior: Behavior normal.        Thought Content: Thought content normal.        Judgment: Judgment normal.      Lab Results  Component Value Date   WBC 7.2 07/29/2023   HGB 11.9 (L) 07/29/2023   HCT 36.8 (L) 07/29/2023   MCV 89.3 07/29/2023   PLT 347 07/29/2023   Lab Results  Component Value Date   FERRITIN 84 07/06/2023   IRON 140 06/15/2023   TIBC 336 06/15/2023   UIBC 196  06/15/2023   IRONPCTSAT 42 (H) 06/15/2023   Lab Results  Component Value Date   RETICCTPCT 0.4 07/29/2023   RBC 4.17 (L) 07/29/2023   No results found for: "KPAFRELGTCHN", "LAMBDASER", "KAPLAMBRATIO" Lab Results  Component Value Date   IGGSERUM 931 05/05/2022   IGMSERUM 59 05/05/2022   No results found for: "TOTALPROTELP", "ALBUMINELP", "A1GS", "A2GS", "BETS", "BETA2SER", "GAMS", "MSPIKE", "SPEI"   Chemistry      Component Value Date/Time   NA 136 07/06/2023 0902   NA 139 07/23/2020 0818   K 3.8 07/06/2023 0902   CL 102 07/06/2023 0902   CO2 28 07/06/2023 0902   BUN 12 07/06/2023 0902   BUN 10 07/23/2020 0818   CREATININE 0.99 07/06/2023 0902   CREATININE 1.06 06/27/2015 0837      Component Value Date/Time   CALCIUM 9.3 07/06/2023 0902   ALKPHOS 74 07/06/2023 0902   AST 11 (L) 07/06/2023 0902   ALT 10 07/06/2023 0902   BILITOT 0.6 07/06/2023 0902       Impression and Plan: Mr. Stankus is a very pleasant 83 yo caucasian gentleman with localized gastric cancer.  His cancer is progressing.  We had him on immunotherapy.  This eventually became somewhat effective.  Because George Dyer does have the BRAF mutation, I tried him on the Mekinist/Braftovi.  Again I am surprised this would cause such mucositis.  I suppose this is always a possibility.  Again, we cannot restart this right now.  If we did restart this, I would certainly do this had a lower dose.  I am going to send off his  tumor for the Claudin 18 mutation.  Right now, we have to just support him through this mucositis.  Hopefully, we will be able to get this approved.  His blood counts are not that bad.  Thankfully George Dyer has not neutropenic.  I just hate that George Dyer is suffering.  Again, I just wish that George Dyer would have considered surgical resection back when we had a much better chance of getting him through surgery.  Now, given his overall performance status, I am not sure how well George Dyer would be able to handle surgery and potential  complications.  Again if George Dyer wants to go down to the  [facility] in [place], I would really have no problems with this.  I just want to do what is best for him.  Thankfully, George Dyer has such a great support system with his family.  They are incredibly diligent incredibly supportive.  I had to give his wife a ton of credit for doing as much as she does for him.  I will probably have to get back in 2 weeks so we can see how George Dyer is doing this hopefully his mouth is much better.      Josph Macho, MD 2/21/20251:21 PM

## 2023-07-29 NOTE — Patient Instructions (Signed)
 Dehydration, Adult Dehydration is a condition in which there is not enough water or other fluids in the body. This happens when a person loses more fluids than they take in. Important organs cannot work right without the right amount of fluids. Any loss of fluids from the body can cause dehydration. Dehydration can be mild, worse, or very bad. It should be treated right away to keep it from getting very bad. What are the causes? Conditions that cause loss of water in the body. They include: Watery poop (diarrhea). Vomiting. Sweating a lot. Fever. Infection. Peeing (urinating) a lot. Not drinking enough fluids. Certain medicines, such as medicines that take extra fluid out of the body (diuretics). Lack of safe drinking water. Not being able to get enough water and food. What increases the risk? Having a long-term (chronic) illness that has not been treated the right way, such as: Diabetes. Heart disease. Kidney disease. Being 25 years of age or older. Having a disability. Living in a place that is high above the ground or sea (high in altitude). The thinner, drier air causes more fluid loss. Doing exercises that put stress on your body for a long time. Being active when in hot places. What are the signs or symptoms? Symptoms of dehydration depend on how bad it is. Mild or worse dehydration Thirst. Dry lips or dry mouth. Feeling dizzy or light-headed. Muscle cramps. Passing little pee or dark pee. Pee may be the color of tea. Headache. Very bad dehydration Changes in skin. Skin may: Be cold to the touch (clammy). Be blotchy or pale. Not go back to normal right after you pinch it and let it go. Little or no tears, pee, or sweat. Fast breathing. Low blood pressure. Weak pulse. Pulse that is more than 100 beats a minute when you are sitting still. Other changes, such as: Feeling very thirsty. Eyes that look hollow (sunken). Cold hands and feet. Being confused. Being very  tired (lethargic) or having trouble waking from sleep. Losing weight. Loss of consciousness. How is this treated? Treatment for this condition depends on how bad your dehydration is. Treatment should start right away. Do not wait until your condition gets very bad. Very bad dehydration is an emergency. You will need to go to a hospital. Mild or worse dehydration can be treated at home. You may be asked to: Drink more fluids. Drink an oral rehydration solution (ORS). This drink gives you the right amount of fluids, salts, and minerals (electrolytes). Very bad dehydration can be treated: With fluids through an IV tube. By correcting low levels of electrolytes in the body. By treating the problem that caused your dehydration. Follow these instructions at home: Oral rehydration solution If told by your doctor, drink an ORS: Make an ORS. Use instructions on the package. Start by drinking small amounts, about  cup (120 mL) every 5-10 minutes. Slowly drink more until you have had the amount that your doctor said to have.  Eating and drinking  Drink enough clear fluid to keep your pee pale yellow. If you were told to drink an ORS, finish the ORS first. Then, start slowly drinking other clear fluids. Drink fluids such as: Water. Do not drink only water. Doing that can make the salt (sodium) level in your body get too low. Water from ice chips you suck on. Fruit juice that you have added water to (diluted). Low-calorie sports drinks. Eat foods that have the right amounts of salts and minerals, such as bananas, oranges, potatoes,  tomatoes, or spinach. Do not drink alcohol. Avoid drinks that have caffeine or sugar. These include:: High-calorie sports drinks. Fruit juice that you did not add water to. Soda. Coffee or energy drinks. Avoid foods that are greasy or have a lot of fat or sugar. General instructions Take over-the-counter and prescription medicines only as told by your doctor. Do  not take sodium tablets. Doing that can make the salt level in your body get too high. Return to your normal activities as told by your doctor. Ask your doctor what activities are safe for you. Keep all follow-up visits. Your doctor may check and change your treatment. Contact a doctor if: You have pain in your belly (abdomen) and the pain: Gets worse. Stays in one place. You have a rash. You have a stiff neck. You get angry or annoyed more easily than normal. You are more tired or have a harder time waking than normal. You feel weak or dizzy. You feel very thirsty. Get help right away if: You have any symptoms of very bad dehydration. You vomit every time you eat or drink. Your vomiting gets worse, does not go away, or you vomit blood or green stuff. You are getting treatment, but symptoms are getting worse. You have a fever. You have a very bad headache. You have: Diarrhea that gets worse or does not go away. Blood in your poop (stool). This may cause poop to look black and tarry. No pee in 6-8 hours. Only a small amount of pee in 6-8 hours, and the pee is very dark. You have trouble breathing. These symptoms may be an emergency. Get help right away. Call 911. Do not wait to see if the symptoms will go away. Do not drive yourself to the hospital. This information is not intended to replace advice given to you by your health care provider. Make sure you discuss any questions you have with your health care provider. Document Revised: 12/21/2021 Document Reviewed: 12/21/2021 Elsevier Patient Education  2024 [employer].

## 2023-08-01 ENCOUNTER — Telehealth: Payer: Self-pay | Admitting: *Deleted

## 2023-08-01 NOTE — Telephone Encounter (Signed)
 Appts made for physician appt, lab and iron appt per dr Myna Hidalgo

## 2023-08-01 NOTE — Telephone Encounter (Signed)
-----   Message from Josph Macho sent at 07/29/2023  4:22 PM EST ----- Call - the iron is very low.  He needs IV irron.  Please set him up for next week.  pete

## 2023-08-02 ENCOUNTER — Other Ambulatory Visit: Payer: Self-pay

## 2023-08-04 ENCOUNTER — Encounter: Payer: Self-pay | Admitting: Hematology & Oncology

## 2023-08-05 ENCOUNTER — Other Ambulatory Visit: Payer: Self-pay

## 2023-08-05 DIAGNOSIS — C160 Malignant neoplasm of cardia: Secondary | ICD-10-CM

## 2023-08-05 DIAGNOSIS — D509 Iron deficiency anemia, unspecified: Secondary | ICD-10-CM | POA: Insufficient documentation

## 2023-08-05 MED ORDER — MORPHINE SULFATE 15 MG PO TABS
15.0000 mg | ORAL_TABLET | Freq: Four times a day (QID) | ORAL | 0 refills | Status: AC | PRN
Start: 2023-08-05 — End: 2023-09-04

## 2023-08-05 NOTE — Progress Notes (Signed)
 Patient's insurance prefers Venofer, not Feraheme. Feraheme discontinued and Venofer 300 mg x 3 ordered per Dr. Gustavo Lah instructions.

## 2023-08-05 NOTE — Telephone Encounter (Signed)
 Pt requesting a refill on Morphine 15 mg #120 prescribed to take every 6 hours PRN for pain. Not on current medlist. Was last prescribed by [place] 10/26/22. Please advise, thanks!

## 2023-08-06 ENCOUNTER — Encounter: Payer: Self-pay | Admitting: Gastroenterology

## 2023-08-12 ENCOUNTER — Inpatient Hospital Stay: Payer: Medicare HMO | Admitting: Hematology & Oncology

## 2023-08-12 ENCOUNTER — Inpatient Hospital Stay: Payer: Medicare HMO

## 2023-08-12 DIAGNOSIS — D509 Iron deficiency anemia, unspecified: Secondary | ICD-10-CM

## 2023-08-15 ENCOUNTER — Other Ambulatory Visit: Payer: Self-pay

## 2023-08-18 ENCOUNTER — Inpatient Hospital Stay

## 2023-08-18 ENCOUNTER — Encounter: Payer: Self-pay | Admitting: *Deleted

## 2023-08-18 ENCOUNTER — Inpatient Hospital Stay: Attending: Hematology & Oncology

## 2023-08-18 ENCOUNTER — Inpatient Hospital Stay (HOSPITAL_BASED_OUTPATIENT_CLINIC_OR_DEPARTMENT_OTHER): Admitting: Medical Oncology

## 2023-08-18 ENCOUNTER — Encounter: Payer: Self-pay | Admitting: Medical Oncology

## 2023-08-18 ENCOUNTER — Other Ambulatory Visit: Payer: Self-pay

## 2023-08-18 VITALS — BP 141/64 | HR 88 | Temp 98.2°F | Resp 18 | Ht 69.0 in | Wt 147.0 lb

## 2023-08-18 DIAGNOSIS — C162 Malignant neoplasm of body of stomach: Secondary | ICD-10-CM | POA: Insufficient documentation

## 2023-08-18 DIAGNOSIS — D649 Anemia, unspecified: Secondary | ICD-10-CM | POA: Insufficient documentation

## 2023-08-18 DIAGNOSIS — C160 Malignant neoplasm of cardia: Secondary | ICD-10-CM | POA: Diagnosis not present

## 2023-08-18 LAB — CBC WITH DIFFERENTIAL (CANCER CENTER ONLY)
Abs Immature Granulocytes: 0.03 10*3/uL (ref 0.00–0.07)
Basophils Absolute: 0.1 10*3/uL (ref 0.0–0.1)
Basophils Relative: 2 %
Eosinophils Absolute: 0.5 10*3/uL (ref 0.0–0.5)
Eosinophils Relative: 9 %
HCT: 37.5 % — ABNORMAL LOW (ref 39.0–52.0)
Hemoglobin: 12 g/dL — ABNORMAL LOW (ref 13.0–17.0)
Immature Granulocytes: 1 %
Lymphocytes Relative: 33 %
Lymphs Abs: 1.7 10*3/uL (ref 0.7–4.0)
MCH: 29.9 pg (ref 26.0–34.0)
MCHC: 32 g/dL (ref 30.0–36.0)
MCV: 93.3 fL (ref 80.0–100.0)
Monocytes Absolute: 0.7 10*3/uL (ref 0.1–1.0)
Monocytes Relative: 14 %
Neutro Abs: 2.2 10*3/uL (ref 1.7–7.7)
Neutrophils Relative %: 41 %
Platelet Count: 346 10*3/uL (ref 150–400)
RBC: 4.02 MIL/uL — ABNORMAL LOW (ref 4.22–5.81)
RDW: 14.4 % (ref 11.5–15.5)
WBC Count: 5.1 10*3/uL (ref 4.0–10.5)
nRBC: 0 % (ref 0.0–0.2)

## 2023-08-18 LAB — CMP (CANCER CENTER ONLY)
ALT: 21 U/L (ref 0–44)
AST: 17 U/L (ref 15–41)
Albumin: 4.1 g/dL (ref 3.5–5.0)
Alkaline Phosphatase: 164 U/L — ABNORMAL HIGH (ref 38–126)
Anion gap: 8 (ref 5–15)
BUN: 20 mg/dL (ref 8–23)
CO2: 29 mmol/L (ref 22–32)
Calcium: 9.2 mg/dL (ref 8.9–10.3)
Chloride: 101 mmol/L (ref 98–111)
Creatinine: 1.07 mg/dL (ref 0.61–1.24)
GFR, Estimated: >60 mL/min (ref >60)
Glucose, Bld: 90 mg/dL (ref 70–99)
Potassium: 4.1 mmol/L (ref 3.5–5.1)
Sodium: 138 mmol/L (ref 135–145)
Total Bilirubin: 0.6 mg/dL (ref 0.0–1.2)
Total Protein: 6.9 g/dL (ref 6.5–8.1)

## 2023-08-18 LAB — PREALBUMIN: Prealbumin: 36 mg/dL (ref 18–38)

## 2023-08-18 NOTE — Progress Notes (Signed)
 DISCONTINUE OFF PATHWAY REGIMEN - Gastroesophageal   [id number] (Leucovorin IV D1 + Fluorouracil IV D1/CIV D1,2 + Irinotecan IV D1) q14 Days:   A cycle is every 14 days:     Irinotecan      Leucovorin      Fluorouracil      Fluorouracil   **Always confirm dose/schedule in your pharmacy ordering system**  PRIOR TREATMENT: Off Pathway: FOLFIRI (Leucovorin IV D1 + Fluorouracil IV D1/CIV D1,2 + Irinotecan IV D1) q14 Days  START OFF PATHWAY REGIMEN - Gastroesophageal   [id number] + Zolbetuximab 800/400 mg/m2 IV D1 q14 Days x 12 Cycles Followed by Flourouracil IV D1/CIV D1,2 + Leucovorin IV D1 + Zolbetuximab 400 mg/m2 IV D1 q14 Days:   Cycles 1: A cycle is 14 days:     Zolbetuximab-clzb      Oxaliplatin      Leucovorin      Fluorouracil      Fluorouracil    Cycles 2 through 12: A cycle is every 14 days:     Zolbetuximab-clzb      Oxaliplatin      Leucovorin      Fluorouracil      Fluorouracil    Cycles 13 and beyond: A cycle is every 14 days:     Zolbetuximab-clzb      Leucovorin      Fluorouracil      Fluorouracil   **Always confirm dose/schedule in your pharmacy ordering system**  Patient Characteristics: Distant Metastases (cM1/pM1) / Locally Recurrent Disease, Adenocarcinoma - Esophageal, GE Junction, and Gastric, Second Line, MSS/pMMR or MSI Unknown Therapeutic Status: Distant Metastases (No Additional Staging) Histology: Adenocarcinoma Disease Classification: Gastric Line of Therapy: Second Line Microsatellite/Mismatch Repair Status: MSS/pMMR Intent of Therapy: Non-Curative / Palliative Intent, Discussed with Patient

## 2023-08-18 NOTE — Progress Notes (Signed)
 Hematology and Oncology Follow Up Visit  George Dyer [id number] November 07, 1940 83 y.o. 08/18/2023   Principle Diagnosis:  Stage IIB (T3N0M0) Gastric cancer -- declines surgery -recurrent-  HER2(-)/ BRAF (+)   Current Therapy:        FOLFOX - s/p cycle #4-  start on 09/15/2021 XRT/Xeloda -- start on 12/07/2021 --completed on 01/12/2022 FOLFIRI/Opdivo -- st/p cycle #1 on 10/19/2022 -- d/c FOLFIRI on 11/02/2022 --changed Opdivo to 21-day cycles on 12/22/2022 - d/c on 07/06/2023 Encorafenib 300mg  po q day/Trametenib 2 mg po q day    Interim History:  George Dyer is here today with his wife and daughter.  At his last visit he was feeling poorly. He was treated with encorafinib and the TRAMETINIB for his gastric cancer.  He took about a week of treatment before stopping. He had a high fever of 104F, severe mucositis, myalgia, nausea, vomiting, diarrhea.   Since this visit they report that he has done really well. He is feeling the best he has felt in years. All symptoms have resolved. He is now eating well.   He has also had Claudin 18 testing which was positive. They wish to proceed forward with targeted chemotherapy.   There has been no bleeding to their knowledge: denies epistaxis, gingivitis, hemoptysis, hematemesis, hematuria, melena, excessive bruising, blood donation.   No abdominal pain, oral discomfort, N/V/D.   Currently, I would say his performance status is probably ECOG 2, at best.    Wt Readings from Last 3 Encounters:  08/18/23 147 lb (66.7 kg)  07/29/23 151 lb (68.5 kg)  07/06/23 156 lb 1.9 oz (70.8 kg)    Medications:  Allergies as of 08/18/2023       Reactions   Percocet [oxycodone-acetaminophen] Nausea And Vomiting   Oxycodone Other (See Comments)   Doesn't remember   Oxycodone-aspirin Nausea And Vomiting        Medication List        Accurate as of August 18, 2023  2:55 PM. If you have any questions, ask your nurse or doctor.           acetaminophen 325 MG tablet Commonly known as: TYLENOL Take 650 mg by mouth 2 (two) times daily as needed.   acyclovir 200 MG/5ML suspension Commonly known as: Zovirax Take 10 mLs (400 mg total) by mouth 4 (four) times daily.   alum & mag hydroxide-simeth 200-200-20 MG/5ML suspension Commonly known as: MAALOX/MYLANTA Take 30 mLs by mouth daily.   chlorhexidine 0.12 % solution Commonly known as: Peridex Use as directed 15 mLs in the mouth or throat 4 (four) times daily.   doxylamine (Sleep) 25 MG tablet Commonly known as: UNISOM Take 25 mg by mouth at bedtime as needed.   fluconazole 40 MG/ML suspension Commonly known as: Diflucan Take 5 mLs (200 mg total) by mouth daily.   HYDROmorphone 2 MG tablet Commonly known as: Dilaudid Take 1 tablet (2 mg total) by mouth every 4 (four) hours as needed for severe pain (pain score 7-10).   levothyroxine 75 MCG tablet Commonly known as: SYNTHROID TAKE 1 TABLET BY MOUTH EVERY DAY BEFORE BREAKFAST   megestrol 625 MG/5ML suspension Commonly known as: MEGACE ES Take 5 mLs (625 mg total) by mouth daily.   Mekinist 2 MG tablet Generic drug: trametinib dimethyl sulfoxide Take 1 tablet (2 mg total) by mouth daily. Take 1 hour before or 2 hours after a meal.   morphine 15 MG tablet Commonly known as: MSIR Take 1 tablet (15 mg  total) by mouth every 6 (six) hours as needed for severe pain (pain score 7-10).   omeprazole 40 MG capsule Commonly known as: PRILOSEC TAKE 1 CAPSULE (40 MG TOTAL) BY MOUTH DAILY.   polyethylene glycol powder 17 GM/SCOOP powder Commonly known as: GlycoLax Take 17 g by mouth 2 (two) times daily.   sucralfate 1 GM/10ML suspension Commonly known as: CARAFATE Take 1 g by mouth 4 (four) times daily.   Tafinlar 75 MG capsule Generic drug: dabrafenib mesylate Take 2 capsules (150 mg total) by mouth 2 (two) times daily. Take on an empty stomach 1 hour before or 2 hours after meals.        Allergies:   Allergies  Allergen Reactions   Percocet [Oxycodone-Acetaminophen] Nausea And Vomiting   Oxycodone Other (See Comments)    Doesn't remember   Oxycodone-Aspirin Nausea And Vomiting    Past Medical History, Surgical history, Social history, and Family History were reviewed and updated.  Review of Systems: Review of Systems  Constitutional: Negative.   HENT: Negative.    Eyes: Negative.   Respiratory: Negative.    Cardiovascular: Negative.   Gastrointestinal:  Negative for nausea and vomiting.  Genitourinary: Negative.   Musculoskeletal: Negative.   Skin: Negative.   Neurological: Negative.   Endo/Heme/Allergies: Negative.   Psychiatric/Behavioral: Negative.     Physical Exam: Vitals:   08/18/23 1112  BP: (!) 141/64  Pulse: 88  Resp: 18  Temp: 98.2 F (36.8 C)  SpO2: 100%   Wt Readings from Last 3 Encounters:  08/18/23 147 lb (66.7 kg)  07/29/23 151 lb (68.5 kg)  07/06/23 156 lb 1.9 oz (70.8 kg)    Physical Exam Vitals reviewed.  HENT:     Head: Normocephalic and atraumatic.  Eyes:     Pupils: Pupils are equal, round, and reactive to light.  Cardiovascular:     Rate and Rhythm: Normal rate and regular rhythm.     Heart sounds: Normal heart sounds.  Pulmonary:     Effort: Pulmonary effort is normal.     Breath sounds: Normal breath sounds.  Abdominal:     General: Bowel sounds are normal.     Palpations: Abdomen is soft.  Musculoskeletal:        General: No tenderness or deformity. Normal range of motion.     Cervical back: Normal range of motion.  Lymphadenopathy:     Cervical: No cervical adenopathy.  Skin:    General: Skin is warm and dry.     Findings: No erythema or rash.  Neurological:     Mental Status: He is alert and oriented to person, place, and time.  Psychiatric:        Behavior: Behavior normal.        Thought Content: Thought content normal.        Judgment: Judgment normal.      Lab Results  Component Value Date   WBC 5.1  08/18/2023   HGB 12.0 (L) 08/18/2023   HCT 37.5 (L) 08/18/2023   MCV 93.3 08/18/2023   PLT 346 08/18/2023   Lab Results  Component Value Date   FERRITIN 959 (H) 07/29/2023   IRON 26 (L) 07/29/2023   TIBC 214 (L) 07/29/2023   UIBC 188 07/29/2023   IRONPCTSAT 12 (L) 07/29/2023   Lab Results  Component Value Date   RETICCTPCT 0.4 07/29/2023   RBC 4.02 (L) 08/18/2023   No results found for: "KPAFRELGTCHN", "LAMBDASER", "KAPLAMBRATIO" Lab Results  Component Value Date   IGGSERUM 931  05/05/2022   IGMSERUM 59 05/05/2022   No results found for: "TOTALPROTELP", "ALBUMINELP", "A1GS", "A2GS", "BETS", "BETA2SER", "GAMS", "MSPIKE", "SPEI"   Chemistry      Component Value Date/Time   NA 138 08/18/2023 1058   NA 139 07/23/2020 0818   K 4.1 08/18/2023 1058   CL 101 08/18/2023 1058   CO2 29 08/18/2023 1058   BUN 20 08/18/2023 1058   BUN 10 07/23/2020 0818   CREATININE 1.07 08/18/2023 1058   CREATININE 1.06 06/27/2015 0837      Component Value Date/Time   CALCIUM 9.2 08/18/2023 1058   ALKPHOS 164 (H) 08/18/2023 1058   AST 17 08/18/2023 1058   ALT 21 08/18/2023 1058   BILITOT 0.6 08/18/2023 1058     Encounter Diagnoses  Name Primary?   Primary cancer of cardia of stomach (HCC) Yes   Malignant neoplasm of body of stomach (HCC)    Impression and Plan: Mr. Whedbee is a very pleasant 83 yo caucasian gentleman with localized gastric cancer.  His cancer is progressing.  We had him on immunotherapy.  This eventually became somewhat effective. Because he does have the BRAF mutation, he was tried on Mekinist/Braftovi which he did not tolerate due to severe mucositis. 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 They now wish to proceed forward with chemotherapy.    CBC shows mild and stable anemia with a Hgb of 12. No neutropenia or thrombocytopenia  CMP shows a greatly improved Alk Phos of 164 from 465- electrolyte abnormalities have resolved  Performance status  appears greatly improved.  We will see if insurance will cover a follow up PET scan to assess tumor bourdon prior to this new treatment series per Dr. Myna Hidalgo.   I have discussed patients care, wishes, status and labs with Dr. Myna Hidalgo.  We will proceed forward with getting patient set up for PICC placement (given history) and chemotherapy.   RTC TBD - please schedule MD, labs in 1 month as a placeholder    Brand Males Anselmo, PA-C 3/13/20252:55 PM

## 2023-08-18 NOTE — Addendum Note (Signed)
 Addended by: Arlan Organ R on: 08/18/2023 05:59 PM   Modules accepted: Orders

## 2023-08-19 ENCOUNTER — Other Ambulatory Visit: Payer: Self-pay

## 2023-08-19 ENCOUNTER — Encounter: Payer: Self-pay | Admitting: Hematology & Oncology

## 2023-08-19 ENCOUNTER — Other Ambulatory Visit: Payer: Self-pay | Admitting: Hematology & Oncology

## 2023-08-19 ENCOUNTER — Other Ambulatory Visit: Payer: Self-pay | Admitting: *Deleted

## 2023-08-19 DIAGNOSIS — C160 Malignant neoplasm of cardia: Secondary | ICD-10-CM

## 2023-08-19 MED ORDER — TEMAZEPAM 7.5 MG PO CAPS: 7.5000 mg | ORAL_CAPSULE | Freq: Every evening | ORAL | 0 refills | Status: DC | PRN

## 2023-08-19 NOTE — Progress Notes (Signed)
 Patient did not tolerate therapy, discontinued. Switching to infusions per chart. Disenrolling.

## 2023-08-21 ENCOUNTER — Other Ambulatory Visit: Payer: Self-pay

## 2023-08-22 ENCOUNTER — Telehealth: Payer: Self-pay | Admitting: *Deleted

## 2023-08-22 NOTE — Telephone Encounter (Signed)
 Received a call from patients daughter George Dyer stating that her dad has decided not to be treated with Zolbetuximab/FOLFOX that has been discussed for him.  He is finally feeling well and excited about the good weather and getting outside. Encouraged George Dyer that that is a perfectly acceptable decision.   Appts cancelled for 3.24, PICC line insertion cancelled.  Dr Myna Hidalgo notified.  George Dyer advised to schedule PET scan and Dr Myna Hidalgo can follow up at the scheduled appt 4/11.  George Dyer agrees with this plan and appreciates the call.  Patient will get his IV iron when he comes 4.11.25

## 2023-08-25 ENCOUNTER — Telehealth: Payer: Self-pay | Admitting: Dietician

## 2023-08-25 ENCOUNTER — Inpatient Hospital Stay

## 2023-08-25 NOTE — Telephone Encounter (Signed)
 Patient screened on MST. First attempt to reach. Patient wasn't home but wife answered and said she believed his recent weight loss was due to mucositis which has resolved and he's eating better.  I let her know the nutrition service is available to the at no charge if she wanted to reach out for future needs when patient is available. Provided my contact information in text to spouses mobile.  Gennaro Africa, RDN, LDN Registered Dietitian, [facility] Part Time Remote (Usual office hours: Tuesday-Thursday) Cell: [phone]

## 2023-08-26 ENCOUNTER — Other Ambulatory Visit (HOSPITAL_COMMUNITY)

## 2023-08-29 ENCOUNTER — Other Ambulatory Visit

## 2023-08-29 ENCOUNTER — Inpatient Hospital Stay

## 2023-08-29 ENCOUNTER — Inpatient Hospital Stay: Admitting: Hematology & Oncology

## 2023-08-30 ENCOUNTER — Inpatient Hospital Stay

## 2023-08-30 ENCOUNTER — Inpatient Hospital Stay: Admitting: Hematology & Oncology

## 2023-08-30 ENCOUNTER — Other Ambulatory Visit

## 2023-08-31 ENCOUNTER — Inpatient Hospital Stay

## 2023-09-01 ENCOUNTER — Encounter (HOSPITAL_COMMUNITY)
Admission: RE | Admit: 2023-09-01 | Discharge: 2023-09-01 | Disposition: A | Discharge status: Home / Self Care | Source: Ambulatory Visit | Attending: Medical Oncology | Admitting: Medical Oncology

## 2023-09-01 ENCOUNTER — Inpatient Hospital Stay

## 2023-09-01 ENCOUNTER — Other Ambulatory Visit: Payer: Self-pay

## 2023-09-01 DIAGNOSIS — C162 Malignant neoplasm of body of stomach: Secondary | ICD-10-CM | POA: Diagnosis present

## 2023-09-01 DIAGNOSIS — C160 Malignant neoplasm of cardia: Secondary | ICD-10-CM | POA: Insufficient documentation

## 2023-09-01 LAB — GLUCOSE, CAPILLARY: Glucose-Capillary: 100 mg/dL — ABNORMAL HIGH (ref 70–99)

## 2023-09-01 MED ORDER — FLUDEOXYGLUCOSE F - 18 (FDG) INJECTION
7.3500 | Freq: Once | INTRAVENOUS | Status: AC
  Administered 2023-09-01: 7.3 via INTRAVENOUS

## 2023-09-02 ENCOUNTER — Other Ambulatory Visit (HOSPITAL_COMMUNITY): Payer: Self-pay

## 2023-09-02 ENCOUNTER — Inpatient Hospital Stay

## 2023-09-02 ENCOUNTER — Other Ambulatory Visit: Payer: Self-pay

## 2023-09-02 DIAGNOSIS — M79604 Pain in right leg: Secondary | ICD-10-CM

## 2023-09-06 ENCOUNTER — Other Ambulatory Visit: Payer: Self-pay

## 2023-09-13 ENCOUNTER — Encounter: Payer: Self-pay | Admitting: Vascular Surgery

## 2023-09-13 ENCOUNTER — Ambulatory Visit: Payer: Medicare HMO | Admitting: Vascular Surgery

## 2023-09-13 ENCOUNTER — Ambulatory Visit (HOSPITAL_COMMUNITY)
Admission: RE | Admit: 2023-09-13 | Discharge: 2023-09-13 | Disposition: A | Discharge status: Home / Self Care | Payer: Medicare HMO | Source: Ambulatory Visit | Attending: Vascular Surgery | Admitting: Vascular Surgery

## 2023-09-13 VITALS — BP 148/79 | HR 71 | Temp 98.8°F | Resp 20 | Ht 69.0 in | Wt 145.2 lb

## 2023-09-13 DIAGNOSIS — M79604 Pain in right leg: Secondary | ICD-10-CM | POA: Diagnosis not present

## 2023-09-13 DIAGNOSIS — M79605 Pain in left leg: Secondary | ICD-10-CM | POA: Diagnosis not present

## 2023-09-13 DIAGNOSIS — T451X5A Adverse effect of antineoplastic and immunosuppressive drugs, initial encounter: Secondary | ICD-10-CM

## 2023-09-13 DIAGNOSIS — G620 Drug-induced polyneuropathy: Secondary | ICD-10-CM

## 2023-09-13 LAB — VAS US ABI WITH/WO TBI
Left ABI: 1.15
Right ABI: 1.12

## 2023-09-13 NOTE — Progress Notes (Signed)
 Patient name: George Dyer MRN: [id number] DOB: May 01, 1941 Sex: male  REASON FOR CONSULT: Severe neuropathy in legs, worried about circulation  HPI: George Dyer is a 82 y.o. male, with history of hypertension and stage IIb gastric cancer that presents for evaluation of neuropathy in both legs with concern for circulation.  Patient states he has had stabbing sensation in both feet for years.  States his mother had peripheral arterial disease.  He did have ABIs on 11/2021 that were 1.07 on the right triphasic and 1.16 on the left triphasic.  According to the wife he is not getting treatment for his gastric cancer at this time.  Past Medical History:  Diagnosis Date   Anxiety    Arthritis    Constipation    ED (erectile dysfunction)    GERD (gastroesophageal reflux disease)    Goals of care, counseling/discussion 08/18/2021   Headache    History of radiation therapy    Stomach- 12/09/21-01/12/22-Dr. 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   COLONOSCOPY  08/06/2021   George Dyer   ESOPHAGOGASTRODUODENOSCOPY (EGD) WITH PROPOFOL N/A 08/13/2021   Procedure: ESOPHAGOGASTRODUODENOSCOPY (EGD) WITH PROPOFOL;  Surgeon: Lemar Lofty., MD;  Location: [facility] ENDOSCOPY;  Service: Gastroenterology;  Laterality: N/A;   EUS N/A 08/13/2021   Procedure: UPPER ENDOSCOPIC ULTRASOUND (EUS) RADIAL;  Surgeon: Lemar Lofty., MD;  Location: [facility] ENDOSCOPY;  Service: Gastroenterology;  Laterality: N/A;    INGUINAL HERNIA REPAIR  07/10/2012   Procedure: LAPAROSCOPIC INGUINAL HERNIA;  Surgeon: Axel Filler, MD;  Location: [facility] OR;  Service: General;  Laterality: Right;   INGUINAL HERNIA REPAIR  07/10/2012   RIH   INSERTION OF MESH  07/10/2012   Procedure: INSERTION OF MESH;  Surgeon: Axel Filler, MD;  Location: [facility] OR;  Service: General;  Laterality: Right;   IR PATIENT EVAL TECH 0-60 MINS  11/26/2021   LUMBAR DISC SURGERY     ORIF HUMERUS FRACTURE Left 01/13/2016   Procedure: OPEN REDUCTION INTERNAL FIXATION (ORIF) LEFT PROXIMAL HUMERUS FRACTURE;  Surgeon: Dominica Severin, MD;  Location: [facility] OR;  Service: Orthopedics;  Laterality: Left;   TONSILLECTOMY     US ECHOCARDIOGRAPHY  04/20/1993   EF 65%    Family History  Problem Relation Age of Onset   Hypertension Mother    Heart attack Father    Heart disease Father    Cancer Maternal Aunt        breast   Esophageal cancer Maternal Uncle    Cancer Maternal Uncle    Colon cancer Neg Hx    Stomach cancer Neg Hx    Rectal cancer Neg Hx     SOCIAL HISTORY: Social History   Socioeconomic History   Marital status: Married    Spouse name: Not on file   Number of children: 3   Years of education: Not on file   Highest education level: Not on  file  Occupational History   Occupation: self employed  Tobacco Use   Smoking status: Former    Current packs/day: 0.00    Average packs/day: 0.3 packs/day for 6.0 years (1.5 ttl pk-yrs)    Types: Cigarettes    Start date: 03/26/1975    Quit date: 03/25/1981    Years since quitting: 42.4   Smokeless tobacco: Never  Vaping Use   Vaping status: Never Used  Substance and Sexual Activity   Alcohol use: No   Drug use: No   Sexual activity: Not Currently  Other Topics Concern   Not on file  Social History Narrative   Are you right handed or left handed? right   Are you currently employed ? retired   What is your current occupation?   Do you live at home alone? With wife   Caffeine  1-2 cups daily   What type of home do you live in: 1 story or 2 story? one       Social Drivers of Corporate investment banker Strain: Not on file  Food Insecurity: Unknown (04/14/2022)   Received from [facility] visits prior to 08/07/2022., [facility] visits prior to 08/07/2022.   Hunger Vital Sign    Worried About Running Out of Food in the Last Year: Patient refused    Ran Out of Food in the Last Year: Patient refused  Transportation Needs: No Transportation Needs (04/14/2022)   Received from [facility]   PRAPARE - Transportation  Physical Activity: Not on file  Stress: Not on file  Social Connections: Not on file  Intimate Partner Violence: Not on file    Allergies  Allergen Reactions   Percocet [Oxycodone-Acetaminophen] Nausea And Vomiting   Oxycodone Other (See Comments)    Doesn't remember   Oxycodone-Aspirin Nausea And Vomiting    Current Outpatient Medications  Medication Sig Dispense Refill   acetaminophen (TYLENOL) 325 MG tablet Take 650 mg by mouth 2 (two) times daily as needed.     acyclovir (ZOVIRAX) 200 MG/5ML suspension Take 10 mLs (400 mg total) by mouth 4 (four) times daily. 240 mL 3   alum & mag hydroxide-simeth (MAALOX/MYLANTA) 200-200-20 MG/5ML suspension Take 30 mLs by mouth daily.     chlorhexidine (PERIDEX) 0.12 % solution Use as directed 15 mLs in the mouth or throat 4 (four) times daily. 500 mL 0   dabrafenib mesylate (TAFINLAR) 75 MG capsule Take 2 capsules (150 mg total) by mouth 2 (two) times daily. Take on an empty stomach 1 hour before or 2 hours after meals. (Patient not taking: Reported on 08/18/2023) 120 capsule 5   doxylamine, Sleep, (UNISOM) 25 MG tablet Take 25 mg by mouth at bedtime as needed.     fluconazole (DIFLUCAN) 40 MG/ML suspension Take 5 mLs (200 mg total) by mouth daily. 100 mL 2   HYDROmorphone (DILAUDID) 2 MG tablet Take 1 tablet (2 mg total) by mouth every 4 (four) hours as needed for  severe pain (pain score 7-10). 60 tablet 0   levothyroxine (SYNTHROID) 75 MCG tablet TAKE 1 TABLET BY MOUTH EVERY DAY BEFORE BREAKFAST 30 tablet 4   megestrol (MEGACE ES) 625 MG/5ML suspension Take 5 mLs (625 mg total) by mouth daily. 150 mL 4   omeprazole (PRILOSEC) 40 MG capsule TAKE 1 CAPSULE (40 MG TOTAL) BY MOUTH DAILY. 90 capsule 1   polyethylene glycol powder (GLYCOLAX) 17 GM/SCOOP powder Take 17 g by mouth 2 (two) times daily. 850  g 6   temazepam (RESTORIL) 7.5 MG capsule Take 1 capsule (7.5 mg total) by mouth at bedtime as needed for sleep. 30 capsule 0   trametinib dimethyl sulfoxide (MEKINIST) 2 MG tablet Take 1 tablet (2 mg total) by mouth daily. Take 1 hour before or 2 hours after a meal. (Patient not taking: Reported on 08/18/2023) 30 tablet 6   No current facility-administered medications for this visit.   Facility-Administered Medications Ordered in Other Visits  Medication Dose Route Frequency Provider Last Rate Last Admin   heparin lock flush 100 unit/mL  500 Units Intravenous Once Ennever, Rose Phi, MD       HYDROmorphone (DILAUDID) injection 1 mg  1 mg Intravenous Once Ennever, Rose Phi, MD       sodium chloride flush (NS) 0.9 % injection 10 mL  10 mL Intravenous Once Ennever, Rose Phi, MD        REVIEW OF SYSTEMS:  [X]  denotes positive finding, [ ]  denotes negative finding Cardiac  Comments:  Chest pain or chest pressure:    Shortness of breath upon exertion:    Short of breath when lying flat:    Irregular heart rhythm:        Vascular    Pain in calf, thigh, or hip brought on by ambulation:    Pain in feet at night that wakes you up from your sleep:     Blood clot in your veins:    Leg swelling:         Pulmonary    Oxygen at home:    Productive cough:     Wheezing:         Neurologic    Sudden weakness in arms or legs:     Sudden numbness in arms or legs:     Sudden onset of difficulty speaking or slurred speech:    Temporary loss of vision in one eye:      Problems with dizziness:         Gastrointestinal    Blood in stool:     Vomited blood:         Genitourinary    Burning when urinating:     Blood in urine:        Psychiatric    Major depression:         Hematologic    Bleeding problems:    Problems with blood clotting too easily:        Skin    Rashes or ulcers:        Constitutional    Fever or chills:      PHYSICAL EXAM: There were no vitals filed for this visit.  GENERAL: The patient is a well-nourished male, in no acute distress. The vital signs are documented above. CARDIAC: There is a regular rate and rhythm.  VASCULAR:  Bilateral femoral pulses palpable Bilateral DP PT pulses palpable No lower extremity tissue loss PULMONARY: No respiratory distress. ABDOMEN: Soft and non-tender. MUSCULOSKELETAL: There are no major deformities or cyanosis. NEUROLOGIC: No focal weakness or paresthesias are detected. SKIN: There are no ulcers or rashes noted. PSYCHIATRIC: The patient has a normal affect.  DATA:   ABIs today are 1.12 on the right multiphasic and 1.15 on the left multiphasic  Assessment/Plan:   83 y.o. male, with history of hypertension, stage IIb gastric cancer that presents for evaluation of neuropathy in both legs with concern for circulation.  I discussed that he has a normal exam with palpable dorsalis pedis and posterior  tibial pulses bilaterally.  He has normal ABIs greater than 1 with normal waveforms at the ankle.  No signs of significant arterial insufficiency.  I think this is all neuropathy.  He has tried gabapentin in the past.  I did offer referral to neurology for further workup and treatment but he defers at this time.  He can follow-up with me as needed.   Cephus Shelling, MD [facility] Office: [phone]

## 2023-09-16 ENCOUNTER — Inpatient Hospital Stay: Attending: Hematology & Oncology

## 2023-09-16 ENCOUNTER — Encounter: Payer: Self-pay | Admitting: Hematology & Oncology

## 2023-09-16 ENCOUNTER — Other Ambulatory Visit: Payer: Self-pay | Admitting: Hematology & Oncology

## 2023-09-16 ENCOUNTER — Inpatient Hospital Stay (HOSPITAL_BASED_OUTPATIENT_CLINIC_OR_DEPARTMENT_OTHER): Admitting: Hematology & Oncology

## 2023-09-16 ENCOUNTER — Inpatient Hospital Stay

## 2023-09-16 ENCOUNTER — Other Ambulatory Visit: Payer: Self-pay

## 2023-09-16 VITALS — BP 144/71 | HR 70 | Temp 98.0°F | Resp 18 | Ht 69.0 in | Wt 146.0 lb

## 2023-09-16 DIAGNOSIS — C160 Malignant neoplasm of cardia: Secondary | ICD-10-CM | POA: Diagnosis not present

## 2023-09-16 DIAGNOSIS — G629 Polyneuropathy, unspecified: Secondary | ICD-10-CM | POA: Insufficient documentation

## 2023-09-16 DIAGNOSIS — C162 Malignant neoplasm of body of stomach: Secondary | ICD-10-CM | POA: Insufficient documentation

## 2023-09-16 LAB — CBC WITH DIFFERENTIAL (CANCER CENTER ONLY)
Abs Immature Granulocytes: 0.03 10*3/uL (ref 0.00–0.07)
Basophils Absolute: 0.1 10*3/uL (ref 0.0–0.1)
Basophils Relative: 2 %
Eosinophils Absolute: 0.4 10*3/uL (ref 0.0–0.5)
Eosinophils Relative: 6 %
HCT: 41 % (ref 39.0–52.0)
Hemoglobin: 13.3 g/dL (ref 13.0–17.0)
Immature Granulocytes: 1 %
Lymphocytes Relative: 23 %
Lymphs Abs: 1.5 10*3/uL (ref 0.7–4.0)
MCH: 29.8 pg (ref 26.0–34.0)
MCHC: 32.4 g/dL (ref 30.0–36.0)
MCV: 91.9 fL (ref 80.0–100.0)
Monocytes Absolute: 0.7 10*3/uL (ref 0.1–1.0)
Monocytes Relative: 12 %
Neutro Abs: 3.7 10*3/uL (ref 1.7–7.7)
Neutrophils Relative %: 56 %
Platelet Count: 342 10*3/uL (ref 150–400)
RBC: 4.46 MIL/uL (ref 4.22–5.81)
RDW: 14.6 % (ref 11.5–15.5)
WBC Count: 6.4 10*3/uL (ref 4.0–10.5)
nRBC: 0 % (ref 0.0–0.2)

## 2023-09-16 LAB — CMP (CANCER CENTER ONLY)
ALT: 8 U/L (ref 0–44)
AST: 11 U/L — ABNORMAL LOW (ref 15–41)
Albumin: 4.1 g/dL (ref 3.5–5.0)
Alkaline Phosphatase: 75 U/L (ref 38–126)
Anion gap: 7 (ref 5–15)
BUN: 16 mg/dL (ref 8–23)
CO2: 30 mmol/L (ref 22–32)
Calcium: 9.6 mg/dL (ref 8.9–10.3)
Chloride: 101 mmol/L (ref 98–111)
Creatinine: 0.88 mg/dL (ref 0.61–1.24)
GFR, Estimated: >60 mL/min (ref >60)
Glucose, Bld: 86 mg/dL (ref 70–99)
Potassium: 4.4 mmol/L (ref 3.5–5.1)
Sodium: 138 mmol/L (ref 135–145)
Total Bilirubin: 0.4 mg/dL (ref 0.0–1.2)
Total Protein: 6.5 g/dL (ref 6.5–8.1)

## 2023-09-16 MED ORDER — TRAMADOL HCL 50 MG PO TABS
50.0000 mg | ORAL_TABLET | Freq: Four times a day (QID) | ORAL | 0 refills | Status: DC | PRN
Start: 2023-09-16 — End: 2023-10-18

## 2023-09-16 NOTE — Progress Notes (Signed)
 Hematology and Oncology Follow Up Visit  George Dyer [id number] [birth year] 83 y.o. 09/16/2023   Principle Diagnosis:  Stage IIB (T3N0M0) Gastric cancer -- declines surgery -recurrent-  HER2(-)/ BRAF (+)   Current Therapy:        FOLFOX - s/p cycle #4-  start on 09/15/2021 XRT/Xeloda -- start on 12/07/2021 --completed on 01/12/2022 FOLFIRI/Opdivo -- st/p cycle #1 on 10/19/2022 -- d/c FOLFIRI on 11/02/2022 --changed Opdivo to 21-day cycles on 12/22/2022 - d/c on 07/06/2023 Encorafenib 300mg  po q day/Trametenib 2 mg po q day    Interim History:  George Dyer is here today with his wife for follow-up.  He has been doing pretty well.  He is off all therapy now.  He is not having any abdominal pain.  As always, his biggest issue is neuropathy.  He did see a vascular specialist to said that he did not have any vascular issues.  I am not surprised by this.  We did do a PET scan on him today.  The PET scan did not show any obvious metastatic disease.  To my reading, I did not see any real growth in the stomach mass.  He has had no nausea or vomiting.  He has had no change in bowel or bladder habits.  Overall, I would have to say that his quality life actually is doing quite well.  He has had no fever.  There is been no bleeding.  Overall, his performance status is ECOG 1    Wt Readings from Last 3 Encounters:  09/16/23 146 lb (66.2 kg)  09/13/23 145 lb 3.2 oz (65.9 kg)  08/18/23 147 lb (66.7 kg)    Medications:  Allergies as of 09/16/2023       Reactions   Percocet [oxycodone-acetaminophen] Nausea And Vomiting   Oxycodone Other (See Comments)   Doesn't remember   Oxycodone-aspirin Nausea And Vomiting        Medication List        Accurate as of September 16, 2023 11:42 AM. If you have any questions, ask your nurse or doctor.          acetaminophen 325 MG tablet Commonly known as: TYLENOL Take 650 mg by mouth 2 (two) times daily as needed.   acyclovir 200 MG/5ML  suspension Commonly known as: Zovirax Take 10 mLs (400 mg total) by mouth 4 (four) times daily.   alum & mag hydroxide-simeth 200-200-20 MG/5ML suspension Commonly known as: MAALOX/MYLANTA Take 30 mLs by mouth daily.   chlorhexidine 0.12 % solution Commonly known as: Peridex Use as directed 15 mLs in the mouth or throat 4 (four) times daily.   doxylamine (Sleep) 25 MG tablet Commonly known as: UNISOM Take 25 mg by mouth at bedtime as needed.   fluconazole 40 MG/ML suspension Commonly known as: Diflucan Take 5 mLs (200 mg total) by mouth daily.   HYDROmorphone 2 MG tablet Commonly known as: Dilaudid Take 1 tablet (2 mg total) by mouth every 4 (four) hours as needed for severe pain (pain score 7-10).   levothyroxine 75 MCG tablet Commonly known as: SYNTHROID TAKE 1 TABLET BY MOUTH EVERY DAY BEFORE BREAKFAST   megestrol 625 MG/5ML suspension Commonly known as: MEGACE ES Take 5 mLs (625 mg total) by mouth daily.   Mekinist 2 MG tablet Generic drug: trametinib dimethyl sulfoxide Take 1 tablet (2 mg total) by mouth daily. Take 1 hour before or 2 hours after a meal.   omeprazole 40 MG capsule Commonly known as: PRILOSEC TAKE  1 CAPSULE (40 MG TOTAL) BY MOUTH DAILY.   polyethylene glycol powder 17 GM/SCOOP powder Commonly known as: GlycoLax Take 17 g by mouth 2 (two) times daily.   Tafinlar 75 MG capsule Generic drug: dabrafenib mesylate Take 2 capsules (150 mg total) by mouth 2 (two) times daily. Take on an empty stomach 1 hour before or 2 hours after meals.   temazepam 7.5 MG capsule Commonly known as: Restoril Take 1 capsule (7.5 mg total) by mouth at bedtime as needed for sleep.   traMADol 50 MG tablet Commonly known as: ULTRAM Take 50 mg by mouth every 6 (six) hours as needed.        Allergies:  Allergies  Allergen Reactions   Percocet [Oxycodone-Acetaminophen] Nausea And Vomiting   Oxycodone Other (See Comments)    Doesn't remember   Oxycodone-Aspirin  Nausea And Vomiting    Past Medical History, Surgical history, Social history, and Family History were reviewed and updated.  Review of Systems: Review of Systems  Constitutional: Negative.   HENT: Negative.    Eyes: Negative.   Respiratory: Negative.    Cardiovascular: Negative.   Gastrointestinal:  Negative for nausea and vomiting.  Genitourinary: Negative.   Musculoskeletal: Negative.   Skin: Negative.   Neurological: Negative.   Endo/Heme/Allergies: Negative.   Psychiatric/Behavioral: Negative.     Physical Exam: Vitals:   09/16/23 1110  BP: (!) 144/71  Pulse: 70  Resp: 18  Temp: 98 F (36.7 C)  SpO2: 99%   Wt Readings from Last 3 Encounters:  09/16/23 146 lb (66.2 kg)  09/13/23 145 lb 3.2 oz (65.9 kg)  08/18/23 147 lb (66.7 kg)    Physical Exam Vitals reviewed.  HENT:     Head: Normocephalic and atraumatic.  Eyes:     Pupils: Pupils are equal, round, and reactive to light.  Cardiovascular:     Rate and Rhythm: Normal rate and regular rhythm.     Heart sounds: Normal heart sounds.  Pulmonary:     Effort: Pulmonary effort is normal.     Breath sounds: Normal breath sounds.  Abdominal:     General: Bowel sounds are normal.     Palpations: Abdomen is soft.  Musculoskeletal:        General: No tenderness or deformity. Normal range of motion.     Cervical back: Normal range of motion.  Lymphadenopathy:     Cervical: No cervical adenopathy.  Skin:    General: Skin is warm and dry.     Findings: No erythema or rash.  Neurological:     Mental Status: He is alert and oriented to person, place, and time.  Psychiatric:        Behavior: Behavior normal.        Thought Content: Thought content normal.        Judgment: Judgment normal.     Lab Results  Component Value Date   WBC 6.4 09/16/2023   HGB 13.3 09/16/2023   HCT 41.0 09/16/2023   MCV 91.9 09/16/2023   PLT 342 09/16/2023   Lab Results  Component Value Date   FERRITIN 959 (H) 07/29/2023    IRON 26 (L) 07/29/2023   TIBC 214 (L) 07/29/2023   UIBC 188 07/29/2023   IRONPCTSAT 12 (L) 07/29/2023   Lab Results  Component Value Date   RETICCTPCT 0.4 07/29/2023   RBC 4.46 09/16/2023   No results found for: "KPAFRELGTCHN", "LAMBDASER", "[facility]" Lab Results  Component Value Date   IGGSERUM 931 05/05/2022   IGMSERUM  59 05/05/2022   No results found for: "TOTALPROTELP", "ALBUMINELP", "A1GS", "A2GS", "BETS", "BETA2SER", "GAMS", "MSPIKE", "SPEI"   Chemistry      Component Value Date/Time   NA 138 09/16/2023 1012   NA 139 07/23/2020 0818   K 4.4 09/16/2023 1012   CL 101 09/16/2023 1012   CO2 30 09/16/2023 1012   BUN 16 09/16/2023 1012   BUN 10 07/23/2020 0818   CREATININE 0.88 09/16/2023 1012   CREATININE 1.06 06/27/2015 0837      Component Value Date/Time   CALCIUM 9.6 09/16/2023 1012   ALKPHOS 75 09/16/2023 1012   AST 11 (L) 09/16/2023 1012   ALT 8 09/16/2023 1012   BILITOT 0.4 09/16/2023 1012     No diagnosis found.  Impression and Plan: George Dyer is a very pleasant 83 yo caucasian gentleman with localized gastric cancer.  His cancer is progressing.  We had him on immunotherapy.  This eventually became somewhat effective. Because he does have the BRAF mutation, he was tried on Mekinist/Braftovi which he did not tolerate due to severe mucositis. 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 They now wish to proceed forward with chemotherapy.   Overall, I have to say that I am very impressed with the fact that his cancer really has not grown from my perspective.  He is not symptomatic from it.  He is declined any additional therapy which I certainly understand.  I think the best way for [place] to monitor this is with a PET scan.  I probably get a PET scan in a couple of months.  I did send in some more tramadol for him.  This does seem to help his neuropathy.   Josph Macho, MD 4/11/202511:42 AM

## 2023-09-18 ENCOUNTER — Encounter: Payer: Self-pay | Admitting: Hematology & Oncology

## 2023-09-18 DIAGNOSIS — G2581 Restless legs syndrome: Secondary | ICD-10-CM

## 2023-09-19 ENCOUNTER — Other Ambulatory Visit: Payer: Self-pay

## 2023-09-19 ENCOUNTER — Encounter: Payer: Self-pay | Admitting: Hematology & Oncology

## 2023-09-19 DIAGNOSIS — G2581 Restless legs syndrome: Secondary | ICD-10-CM

## 2023-09-19 MED ORDER — TEMAZEPAM 7.5 MG PO CAPS: 7.5000 mg | ORAL_CAPSULE | Freq: Every evening | ORAL | 0 refills | Status: DC | PRN

## 2023-09-19 NOTE — Telephone Encounter (Signed)
 Refill request for Temazepam. Last refilled 08/19/23 #30 with 0 Rfs. Please advise, thanks.

## 2023-09-22 ENCOUNTER — Inpatient Hospital Stay

## 2023-09-22 ENCOUNTER — Other Ambulatory Visit: Payer: Self-pay | Admitting: Hematology & Oncology

## 2023-09-22 DIAGNOSIS — C160 Malignant neoplasm of cardia: Secondary | ICD-10-CM

## 2023-09-22 NOTE — Progress Notes (Signed)
 [facility] CSW Progress Note  Clinical Child psychotherapist contacted caregiver by phone to assess needs.  Spoke with patient's wife, George Dyer.  She stated patient was unavailable.  She reported having no needs at this time.    Kennth Peal, LCSW Clinical Social Worker [facility]

## 2023-10-17 ENCOUNTER — Other Ambulatory Visit: Payer: Self-pay | Admitting: Hematology & Oncology

## 2023-10-17 ENCOUNTER — Encounter (HOSPITAL_COMMUNITY)
Admission: RE | Admit: 2023-10-17 | Discharge: 2023-10-17 | Disposition: A | Discharge status: Home / Self Care | Source: Ambulatory Visit | Attending: Hematology & Oncology | Admitting: Hematology & Oncology

## 2023-10-17 ENCOUNTER — Encounter: Payer: Self-pay | Admitting: Hematology & Oncology

## 2023-10-17 DIAGNOSIS — C160 Malignant neoplasm of cardia: Secondary | ICD-10-CM | POA: Insufficient documentation

## 2023-10-17 DIAGNOSIS — G2581 Restless legs syndrome: Secondary | ICD-10-CM

## 2023-10-17 LAB — GLUCOSE, CAPILLARY: Glucose-Capillary: 107 mg/dL — ABNORMAL HIGH (ref 70–99)

## 2023-10-17 MED ORDER — FLUDEOXYGLUCOSE F - 18 (FDG) INJECTION
7.2500 | Freq: Once | INTRAVENOUS | Status: AC
Start: 2023-10-17 — End: 2023-10-17
  Administered 2023-10-17: 7.25 via INTRAVENOUS

## 2023-10-18 ENCOUNTER — Other Ambulatory Visit: Payer: Self-pay | Admitting: Hematology & Oncology

## 2023-10-18 ENCOUNTER — Encounter: Payer: Self-pay | Admitting: Hematology & Oncology

## 2023-10-18 ENCOUNTER — Encounter: Payer: Self-pay | Admitting: *Deleted

## 2023-10-18 DIAGNOSIS — G2581 Restless legs syndrome: Secondary | ICD-10-CM

## 2023-10-18 MED ORDER — TEMAZEPAM 7.5 MG PO CAPS: 7.5000 mg | ORAL_CAPSULE | Freq: Every evening | ORAL | 0 refills | Status: DC | PRN

## 2023-10-19 ENCOUNTER — Ambulatory Visit: Payer: Self-pay | Admitting: Hematology & Oncology

## 2023-11-16 ENCOUNTER — Other Ambulatory Visit: Payer: Self-pay | Admitting: Hematology & Oncology

## 2023-11-16 DIAGNOSIS — C160 Malignant neoplasm of cardia: Secondary | ICD-10-CM

## 2023-11-17 ENCOUNTER — Other Ambulatory Visit: Payer: Self-pay

## 2023-11-17 ENCOUNTER — Encounter: Payer: Self-pay | Admitting: Hematology & Oncology

## 2023-11-17 DIAGNOSIS — G2581 Restless legs syndrome: Secondary | ICD-10-CM

## 2023-11-17 MED ORDER — TEMAZEPAM 7.5 MG PO CAPS
7.5000 mg | ORAL_CAPSULE | Freq: Every evening | ORAL | 0 refills | Status: DC | PRN
Start: 2023-11-17 — End: 2023-12-19

## 2023-12-02 ENCOUNTER — Inpatient Hospital Stay

## 2023-12-02 ENCOUNTER — Encounter: Payer: Self-pay | Admitting: Hematology & Oncology

## 2023-12-02 ENCOUNTER — Inpatient Hospital Stay: Attending: Hematology & Oncology

## 2023-12-02 ENCOUNTER — Inpatient Hospital Stay (HOSPITAL_BASED_OUTPATIENT_CLINIC_OR_DEPARTMENT_OTHER): Admitting: Hematology & Oncology

## 2023-12-02 ENCOUNTER — Encounter: Payer: Self-pay | Admitting: *Deleted

## 2023-12-02 VITALS — BP 135/64 | HR 76 | Temp 98.5°F | Resp 20 | Ht 69.0 in | Wt 144.0 lb

## 2023-12-02 DIAGNOSIS — G63 Polyneuropathy in diseases classified elsewhere: Secondary | ICD-10-CM

## 2023-12-02 DIAGNOSIS — G629 Polyneuropathy, unspecified: Secondary | ICD-10-CM | POA: Diagnosis not present

## 2023-12-02 DIAGNOSIS — C162 Malignant neoplasm of body of stomach: Secondary | ICD-10-CM

## 2023-12-02 DIAGNOSIS — C160 Malignant neoplasm of cardia: Secondary | ICD-10-CM

## 2023-12-02 DIAGNOSIS — D509 Iron deficiency anemia, unspecified: Secondary | ICD-10-CM

## 2023-12-02 DIAGNOSIS — M79604 Pain in right leg: Secondary | ICD-10-CM

## 2023-12-02 DIAGNOSIS — G2581 Restless legs syndrome: Secondary | ICD-10-CM

## 2023-12-02 LAB — CMP (CANCER CENTER ONLY)
ALT: 12 U/L (ref 0–44)
AST: 13 U/L — ABNORMAL LOW (ref 15–41)
Albumin: 4.3 g/dL (ref 3.5–5.0)
Alkaline Phosphatase: 90 U/L (ref 38–126)
Anion gap: 8 (ref 5–15)
BUN: 17 mg/dL (ref 8–23)
CO2: 29 mmol/L (ref 22–32)
Calcium: 9.9 mg/dL (ref 8.9–10.3)
Chloride: 100 mmol/L (ref 98–111)
Creatinine: 0.98 mg/dL (ref 0.61–1.24)
GFR, Estimated: >60 mL/min (ref >60)
Glucose, Bld: 109 mg/dL — ABNORMAL HIGH (ref 70–99)
Potassium: 4 mmol/L (ref 3.5–5.1)
Sodium: 137 mmol/L (ref 135–145)
Total Bilirubin: 0.4 mg/dL (ref 0.0–1.2)
Total Protein: 6.7 g/dL (ref 6.5–8.1)

## 2023-12-02 LAB — CBC WITH DIFFERENTIAL (CANCER CENTER ONLY)
Abs Immature Granulocytes: 0.05 10*3/uL (ref 0.00–0.07)
Basophils Absolute: 0.1 10*3/uL (ref 0.0–0.1)
Basophils Relative: 1 %
Eosinophils Absolute: 0.2 10*3/uL (ref 0.0–0.5)
Eosinophils Relative: 2 %
HCT: 43.4 % (ref 39.0–52.0)
Hemoglobin: 13.9 g/dL (ref 13.0–17.0)
Immature Granulocytes: 0 %
Lymphocytes Relative: 13 %
Lymphs Abs: 1.4 10*3/uL (ref 0.7–4.0)
MCH: 28.5 pg (ref 26.0–34.0)
MCHC: 32 g/dL (ref 30.0–36.0)
MCV: 89.1 fL (ref 80.0–100.0)
Monocytes Absolute: 0.3 10*3/uL (ref 0.1–1.0)
Monocytes Relative: 3 %
Neutro Abs: 9.3 10*3/uL — ABNORMAL HIGH (ref 1.7–7.7)
Neutrophils Relative %: 81 %
Platelet Count: 376 10*3/uL (ref 150–400)
RBC: 4.87 MIL/uL (ref 4.22–5.81)
RDW: 12.9 % (ref 11.5–15.5)
WBC Count: 11.4 10*3/uL — ABNORMAL HIGH (ref 4.0–10.5)
nRBC: 0 % (ref 0.0–0.2)

## 2023-12-02 LAB — IRON AND IRON BINDING CAPACITY (CC-WL,HP ONLY)
Iron: 60 ug/dL (ref 45–182)
Saturation Ratios: 20 % (ref 17.9–39.5)
TIBC: 308 ug/dL (ref 250–450)
UIBC: 248 ug/dL (ref 117–376)

## 2023-12-02 LAB — FERRITIN: Ferritin: 95 ng/mL (ref 24–336)

## 2023-12-02 NOTE — Progress Notes (Signed)
 Per Dr Jessy request, referral sent to [facility] for consideration of TENS to treat neuropathy. Referral form downloaded from their site and packet faxed to [phone].  Oncology Nurse Navigator Documentation     12/02/2023    1:00 PM  Oncology Nurse Navigator Flowsheets  Navigator Location [facility]  Navigator Encounter Type Other:  Patient Visit Type MedOnc  Interventions Referrals  Acuity Level 1-No Barriers  Referrals Other  Time Spent with Patient 15

## 2023-12-02 NOTE — Progress Notes (Signed)
 Hematology and Oncology Follow Up Visit  George Dyer [id number] 1941/02/24 83 y.o. 12/02/2023   Principle Diagnosis:  Stage IIB (T3N0M0) Gastric cancer -- declines surgery -recurrent-  HER2(-)/ BRAF (+)   Current Therapy:        FOLFOX - s/p cycle #4-  start on 09/15/2021 XRT/Xeloda  -- start on 12/07/2021 --completed on 01/12/2022 FOLFIRI/Opdivo  -- st/p cycle #1 on 10/19/2022 -- d/c FOLFIRI on 11/02/2022 --changed Opdivo  to 21-day cycles on 12/22/2022 - d/c on 07/06/2023     Interim History:  [name] is here today with his wife for follow-up.  Clearly, his biggest problem is his neuropathy.  He is really, really bothered by the neuropathy.  I think manage see about getting to a Pain Clinic.  I know that there are some newer treatments for neuropathy.  I think they may need to have some kind of neuro stimulator.  I think that there is a newer COVID treatment out for axon therapy.  I think this is called Neuralace.  He had a PET scan that was done in May.  Thankfully, the PET scan did not show any evidence of cancer progression.  His tumor had a SUV of 8.3.  Previously, the SUV was 9.2.  He is eating okay.  He is having no nausea or vomiting.  The hot weather has decreased his appetite a little bit.  He has had no issues with bleeding.  He has had no change in bowel or bladder habits.  Again, the neuropathy in his legs just really has been a hindrance.  Again this really dictates his quality of life.  Overall, I would have said that his performance status is probably ECOG 1.      Wt Readings from Last 3 Encounters:  12/02/23 144 lb 0.6 oz (65.3 kg)  09/16/23 146 lb (66.2 kg)  09/13/23 145 lb 3.2 oz (65.9 kg)    Medications:  Allergies as of 12/02/2023       Reactions   Percocet [oxycodone -acetaminophen ] Nausea And Vomiting   Oxycodone  Other (See Comments)   Doesn't remember   Oxycodone -aspirin Nausea And Vomiting        Medication List        Accurate as of  December 02, 2023 12:26 PM. If you have any questions, ask your nurse or doctor.          acetaminophen  325 MG tablet Commonly known as: TYLENOL  Take 650 mg by mouth 2 (two) times daily as needed.   acyclovir  200 MG/5ML suspension Commonly known as: Zovirax  Take 10 mLs (400 mg total) by mouth 4 (four) times daily.   alum & mag hydroxide-simeth 200-200-20 MG/5ML suspension Commonly known as: MAALOX/MYLANTA Take 30 mLs by mouth daily.   chlorhexidine  0.12 % solution Commonly known as: Peridex  Use as directed 15 mLs in the mouth or throat 4 (four) times daily.   doxylamine (Sleep) 25 MG tablet Commonly known as: UNISOM Take 25 mg by mouth at bedtime as needed.   fluconazole  40 MG/ML suspension Commonly known as: Diflucan  Take 5 mLs (200 mg total) by mouth daily.   levothyroxine  75 MCG tablet Commonly known as: SYNTHROID  TAKE 1 TABLET BY MOUTH EVERY DAY BEFORE BREAKFAST   megestrol  625 MG/5ML suspension Commonly known as: MEGACE  ES Take 5 mLs (625 mg total) by mouth daily.   Mekinist  2 MG tablet Generic drug: trametinib  dimethyl sulfoxide  Take 1 tablet (2 mg total) by mouth daily. Take 1 hour before or 2 hours after a meal.  omeprazole  40 MG capsule Commonly known as: PRILOSEC TAKE 1 CAPSULE (40 MG TOTAL) BY MOUTH DAILY.   ondansetron  8 MG tablet Commonly known as: ZOFRAN  TAKE 1 TABLET BY MOUTH EVERY 8 HOURS AS NEEDED FOR NAUSEA, VOMITING OR REFRACTORY NAUSEA / VOMITING. START ON THE THIRD DAY AFTER CHEMOTHERAPY.   polyethylene glycol powder 17 GM/SCOOP powder Commonly known as: GlycoLax  Take 17 g by mouth 2 (two) times daily.   pregabalin 50 MG capsule Commonly known as: LYRICA Take 50 mg by mouth 2 (two) times daily.   prochlorperazine  10 MG tablet Commonly known as: COMPAZINE  Take 10 mg by mouth every 6 (six) hours as needed.   Tafinlar  75 MG capsule Generic drug: dabrafenib mesylate  Take 2 capsules (150 mg total) by mouth 2 (two) times daily. Take on an  empty stomach 1 hour before or 2 hours after meals.   temazepam  7.5 MG capsule Commonly known as: Restoril  Take 1 capsule (7.5 mg total) by mouth at bedtime as needed for sleep.   traMADol  50 MG tablet Commonly known as: ULTRAM  TAKE 1 TABLET BY MOUTH EVERY 6 HOURS AS NEEDED.        Allergies:  Allergies  Allergen Reactions   Percocet [Oxycodone -Acetaminophen ] Nausea And Vomiting   Oxycodone  Other (See Comments)    Doesn't remember   Oxycodone -Aspirin Nausea And Vomiting    Past Medical History, Surgical history, Social history, and Family History were reviewed and updated.  Review of Systems: Review of Systems  Constitutional: Negative.   HENT: Negative.    Eyes: Negative.   Respiratory: Negative.    Cardiovascular: Negative.   Gastrointestinal:  Negative for nausea and vomiting.  Genitourinary: Negative.   Musculoskeletal: Negative.   Skin: Negative.   Neurological: Negative.   Endo/Heme/Allergies: Negative.   Psychiatric/Behavioral: Negative.     Physical Exam: Vitals:   12/02/23 1107  BP: 135/64  Pulse: 76  Resp: 20  Temp: 98.5 F (36.9 C)  SpO2: 99%   Wt Readings from Last 3 Encounters:  12/02/23 144 lb 0.6 oz (65.3 kg)  09/16/23 146 lb (66.2 kg)  09/13/23 145 lb 3.2 oz (65.9 kg)    Physical Exam Vitals reviewed.  HENT:     Head: Normocephalic and atraumatic.   Eyes:     Pupils: Pupils are equal, round, and reactive to light.    Cardiovascular:     Rate and Rhythm: Normal rate and regular rhythm.     Heart sounds: Normal heart sounds.  Pulmonary:     Effort: Pulmonary effort is normal.     Breath sounds: Normal breath sounds.  Abdominal:     General: Bowel sounds are normal.     Palpations: Abdomen is soft.   Musculoskeletal:        General: No tenderness or deformity. Normal range of motion.     Cervical back: Normal range of motion.  Lymphadenopathy:     Cervical: No cervical adenopathy.   Skin:    General: Skin is warm and dry.      Findings: No erythema or rash.   Neurological:     Mental Status: He is alert and oriented to person, place, and time.   Psychiatric:        Behavior: Behavior normal.        Thought Content: Thought content normal.        Judgment: Judgment normal.      Lab Results  Component Value Date   WBC 11.4 (H) 12/02/2023   HGB 13.9 12/02/2023  HCT 43.4 12/02/2023   MCV 89.1 12/02/2023   PLT 376 12/02/2023   Lab Results  Component Value Date   FERRITIN 959 (H) 07/29/2023   IRON 26 (L) 07/29/2023   TIBC 214 (L) 07/29/2023   UIBC 188 07/29/2023   IRONPCTSAT 12 (L) 07/29/2023   Lab Results  Component Value Date   RETICCTPCT 0.4 07/29/2023   RBC 4.87 12/02/2023   No results found for: JONATHAN BONG [facility] Lab Results  Component Value Date   IGGSERUM 931 05/05/2022   IGMSERUM 59 05/05/2022   No results found for: STEPHANY CARLOTA BENSON MARKEL EARLA JOANNIE DOC VICK, SPEI   Chemistry      Component Value Date/Time   NA 137 12/02/2023 1013   NA 139 07/23/2020 0818   K 4.0 12/02/2023 1013   CL 100 12/02/2023 1013   CO2 29 12/02/2023 1013   BUN 17 12/02/2023 1013   BUN 10 07/23/2020 0818   CREATININE 0.98 12/02/2023 1013   CREATININE 1.06 06/27/2015 0837      Component Value Date/Time   CALCIUM  9.9 12/02/2023 1013   ALKPHOS 90 12/02/2023 1013   AST 13 (L) 12/02/2023 1013   ALT 12 12/02/2023 1013   BILITOT 0.4 12/02/2023 1013       Impression and Plan: Mr. Barret is a very pleasant 83 yo caucasian gentleman with localized gastric cancer.  His cancer is progressing.  We had him on immunotherapy.  This eventually became somewhat effective. Because he does have the BRAF mutation, he was tried on Mekinist /Braftovi  which he did not tolerate due to severe mucositis. 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  However, they do not want chemotherapy.  As such, we are just watching him right now.     Another PET scan will be done in August.  I think this is a reasonable time to do this.  Again, we really have to work on his neuropathy.  Maybe, we can get him to a pain clinic.  Maybe, he would be a candidate for a TENS unit.  I does want to have his quality of life better.  I will see him back in August.  Again we will get a PET scan before I see him back.  Maude JONELLE Crease, MD 6/27/202512:26 PM

## 2023-12-19 ENCOUNTER — Encounter: Payer: Self-pay | Admitting: Hematology & Oncology

## 2023-12-19 ENCOUNTER — Other Ambulatory Visit: Payer: Self-pay | Admitting: *Deleted

## 2023-12-19 DIAGNOSIS — G2581 Restless legs syndrome: Secondary | ICD-10-CM

## 2023-12-19 MED ORDER — TEMAZEPAM 7.5 MG PO CAPS: 7.5000 mg | ORAL_CAPSULE | Freq: Every evening | ORAL | 0 refills | Status: DC | PRN

## 2023-12-22 ENCOUNTER — Other Ambulatory Visit: Payer: Self-pay | Admitting: Hematology & Oncology

## 2024-01-11 ENCOUNTER — Encounter: Payer: Self-pay | Admitting: *Deleted

## 2024-01-18 ENCOUNTER — Encounter (HOSPITAL_COMMUNITY)
Admission: RE | Admit: 2024-01-18 | Discharge: 2024-01-18 | Disposition: A | Discharge status: Home / Self Care | Source: Ambulatory Visit | Attending: Hematology & Oncology | Admitting: Hematology & Oncology

## 2024-01-18 DIAGNOSIS — C160 Malignant neoplasm of cardia: Secondary | ICD-10-CM | POA: Insufficient documentation

## 2024-01-18 LAB — GLUCOSE, CAPILLARY: Glucose-Capillary: 87 mg/dL (ref 70–99)

## 2024-01-18 MED ORDER — FLUDEOXYGLUCOSE F - 18 (FDG) INJECTION
7.2000 | Freq: Once | INTRAVENOUS | Status: AC
  Administered 2024-01-18 (×2): 7.19 via INTRAVENOUS

## 2024-01-25 ENCOUNTER — Encounter: Payer: Self-pay | Admitting: Hematology & Oncology

## 2024-01-25 ENCOUNTER — Other Ambulatory Visit: Payer: Self-pay | Admitting: Hematology & Oncology

## 2024-01-25 DIAGNOSIS — C169 Malignant neoplasm of stomach, unspecified: Secondary | ICD-10-CM

## 2024-01-25 DIAGNOSIS — C160 Malignant neoplasm of cardia: Secondary | ICD-10-CM

## 2024-01-25 DIAGNOSIS — G620 Drug-induced polyneuropathy: Secondary | ICD-10-CM

## 2024-01-26 ENCOUNTER — Inpatient Hospital Stay: Attending: Hematology & Oncology

## 2024-01-26 ENCOUNTER — Encounter: Payer: Self-pay | Admitting: Hematology & Oncology

## 2024-01-26 ENCOUNTER — Inpatient Hospital Stay: Admitting: Hematology & Oncology

## 2024-01-26 ENCOUNTER — Inpatient Hospital Stay

## 2024-01-26 ENCOUNTER — Other Ambulatory Visit: Payer: Self-pay

## 2024-01-26 VITALS — BP 130/64 | HR 69 | Temp 98.2°F | Resp 19 | Ht 69.0 in | Wt 144.0 lb

## 2024-01-26 VITALS — BP 133/78 | HR 73 | Temp 98.3°F | Resp 20

## 2024-01-26 DIAGNOSIS — E611 Iron deficiency: Secondary | ICD-10-CM | POA: Insufficient documentation

## 2024-01-26 DIAGNOSIS — T451X5A Adverse effect of antineoplastic and immunosuppressive drugs, initial encounter: Secondary | ICD-10-CM | POA: Diagnosis not present

## 2024-01-26 DIAGNOSIS — C169 Malignant neoplasm of stomach, unspecified: Secondary | ICD-10-CM

## 2024-01-26 DIAGNOSIS — Z1509 Genetic susceptibility to other malignant neoplasm: Secondary | ICD-10-CM | POA: Insufficient documentation

## 2024-01-26 DIAGNOSIS — G620 Drug-induced polyneuropathy: Secondary | ICD-10-CM | POA: Diagnosis not present

## 2024-01-26 DIAGNOSIS — C160 Malignant neoplasm of cardia: Secondary | ICD-10-CM

## 2024-01-26 DIAGNOSIS — D509 Iron deficiency anemia, unspecified: Secondary | ICD-10-CM

## 2024-01-26 LAB — CMP (CANCER CENTER ONLY)
ALT: 12 U/L (ref 0–44)
AST: 15 U/L (ref 15–41)
Albumin: 3.9 g/dL (ref 3.5–5.0)
Alkaline Phosphatase: 102 U/L (ref 38–126)
Anion gap: 10 (ref 5–15)
BUN: 17 mg/dL (ref 8–23)
CO2: 27 mmol/L (ref 22–32)
Calcium: 9.5 mg/dL (ref 8.9–10.3)
Chloride: 101 mmol/L (ref 98–111)
Creatinine: 1.02 mg/dL (ref 0.61–1.24)
GFR, Estimated: >60 mL/min (ref >60)
Glucose, Bld: 95 mg/dL (ref 70–99)
Potassium: 4.3 mmol/L (ref 3.5–5.1)
Sodium: 139 mmol/L (ref 135–145)
Total Bilirubin: 0.4 mg/dL (ref 0.0–1.2)
Total Protein: 6.4 g/dL — ABNORMAL LOW (ref 6.5–8.1)

## 2024-01-26 LAB — LACTATE DEHYDROGENASE: LDH: 110 U/L (ref 98–192)

## 2024-01-26 LAB — PREALBUMIN: Prealbumin: 24 mg/dL (ref 18–38)

## 2024-01-26 LAB — CBC WITH DIFFERENTIAL (CANCER CENTER ONLY)
Abs Immature Granulocytes: 0.04 K/uL (ref 0.00–0.07)
Basophils Absolute: 0.1 K/uL (ref 0.0–0.1)
Basophils Relative: 1 %
Eosinophils Absolute: 0.4 K/uL (ref 0.0–0.5)
Eosinophils Relative: 5 %
HCT: 37.2 % — ABNORMAL LOW (ref 39.0–52.0)
Hemoglobin: 12 g/dL — ABNORMAL LOW (ref 13.0–17.0)
Immature Granulocytes: 1 %
Lymphocytes Relative: 17 %
Lymphs Abs: 1.4 K/uL (ref 0.7–4.0)
MCH: 28.8 pg (ref 26.0–34.0)
MCHC: 32.3 g/dL (ref 30.0–36.0)
MCV: 89.2 fL (ref 80.0–100.0)
Monocytes Absolute: 1 K/uL (ref 0.1–1.0)
Monocytes Relative: 12 %
Neutro Abs: 5.4 K/uL (ref 1.7–7.7)
Neutrophils Relative %: 64 %
Platelet Count: 359 K/uL (ref 150–400)
RBC: 4.17 MIL/uL — ABNORMAL LOW (ref 4.22–5.81)
RDW: 13.7 % (ref 11.5–15.5)
WBC Count: 8.3 K/uL (ref 4.0–10.5)
nRBC: 0 % (ref 0.0–0.2)

## 2024-01-26 MED ORDER — TRAMADOL HCL 50 MG PO TABS: 50.0000 mg | ORAL_TABLET | Freq: Four times a day (QID) | ORAL | 0 refills | Status: DC | PRN

## 2024-01-26 MED ORDER — IRON SUCROSE 300 MG IVPB - SIMPLE MED
300.0000 mg | Freq: Once | Status: DC
Start: 2024-01-26 — End: 2024-01-26
  Filled 2024-01-26: qty 265

## 2024-01-26 MED ORDER — SODIUM CHLORIDE 0.9 % IV SOLN: INTRAVENOUS | Status: DC

## 2024-01-26 MED ORDER — HYDROMORPHONE HCL 1 MG/ML IJ SOLN
1.0000 mg | Freq: Once | INTRAMUSCULAR | Status: AC
  Administered 2024-01-26: 1 mg via INTRAVENOUS
  Filled 2024-01-26: qty 1

## 2024-01-26 MED ORDER — SODIUM CHLORIDE 0.9 % IV SOLN
300.0000 mg | Freq: Once | INTRAVENOUS | Status: AC
  Administered 2024-01-26: 300 mg via INTRAVENOUS
  Filled 2024-01-26: qty 300

## 2024-01-26 NOTE — Patient Instructions (Signed)

## 2024-01-26 NOTE — Progress Notes (Signed)
 DISCONTINUE OFF PATHWAY REGIMEN - Gastroesophageal   [id number]:mFOLFOX6 + Zolbetuximab 800/400 mg/m2 IV D1 q14 Days x 12 Cycles Followed by Flourouracil IV D1/CIV D1,2 + Leucovorin  IV D1 + Zolbetuximab 400 mg/m2 IV D1 q14 Days:   Cycles 1: A cycle is 14 days:     Zolbetuximab-clzb      Oxaliplatin       Leucovorin       Fluorouracil       Fluorouracil     Cycles 2 through 12: A cycle is every 14 days:     Zolbetuximab-clzb      Oxaliplatin       Leucovorin       Fluorouracil       Fluorouracil     Cycles 13 and beyond: A cycle is every 14 days:     Zolbetuximab-clzb      Leucovorin       Fluorouracil       Fluorouracil    **Always confirm dose/schedule in your pharmacy ordering system**  PRIOR TREATMENT: Off Pathway: [id number] + Zolbetuximab 800/400 mg/m2 IV D1 q14 Days x 12 Cycles Followed by Flourouracil IV D1/CIV D1,2 + Leucovorin  IV D1 + Zolbetuximab 400 mg/m2 IV D1 q14 Days  START OFF PATHWAY REGIMEN - Gastroesophageal   [id number]:CapeOx (Capecitabine  1,000 mg/m2 PO BID D1-14 + Oxaliplatin  130 mg/m2 IV D1) + Zolbetuximab 800/600 mg/m2 IV D1 q21 Days x 8 Cycles Followed by Capecitabine  1,000 mg/m2 PO BID D1-14 + Zolbetuximab 600 mg/m2 IV D1 q21 Days:   Cycle 1: A cycle is 21 days:     Zolbetuximab-clzb      Capecitabine       Oxaliplatin     Cycles 2 through 8: A cycle is every 21 days:     Zolbetuximab-clzb      Capecitabine       Oxaliplatin     Cycles 9 and beyond: A cycle is every 21 days:     Zolbetuximab-clzb      Capecitabine    **Always confirm dose/schedule in your pharmacy ordering system**  Patient Characteristics: Distant Metastases (cM1/pM1) / Locally Recurrent Disease, Adenocarcinoma - Esophageal, GE Junction, and Gastric, Third Line and Beyond, HER2 Negative/Unknown and MSS/pMMR or MSI Unknown Therapeutic Status: Distant Metastases (No Additional Staging) Histology: Adenocarcinoma Disease Classification: Gastric Line of Therapy: Third Therapist, art Status: MSS/pMMR HER2 Status: Negative Intent of Therapy: Non-Curative / Palliative Intent, Discussed with Patient

## 2024-01-26 NOTE — Progress Notes (Signed)
 Hematology and Oncology Follow Up Visit  OSIAS RESNICK [id number] 1941-03-25 83 y.o. 01/26/2024   Principle Diagnosis:  Stage IIB (T3N0M0) Gastric cancer -- declines surgery -recurrent-  HER2(-)/ BRAF (+)   Current Therapy:        FOLFOX - s/p cycle #4-  start on 09/15/2021 XRT/Xeloda  -- start on 12/07/2021 --completed on 01/12/2022 FOLFIRI/Opdivo  -- st/p cycle #1 on 10/19/2022 -- d/c FOLFIRI on 11/02/2022 --changed Opdivo  to 21-day cycles on 12/22/2022 - d/c on 07/06/2023     Interim History:  Mr. Krolak is here today with his wife for follow-up.  He had his PET scan that was done.  This was done on 01/18/2024.  Unfortunately, we now are seeing that the tumor is starting to grow.  The tumor is now 6.4 x 4.2 cm.  He has an SUV of 13.6.  Previously, it was 4 x 1.8 cm with an SUV of 8.4.  In addition, there may be some activity in the left lower lung.  Again, he is still bothered by neuropathy.  He cannot take gabapentin .  Maybe, we can try him on Cymbalta .  He is eating.  He is not does not eat as much.  His weight is holding steady at 144 pounds.  He has had no bleeding.  He has had no diarrhea.  He has had no leg swelling.  He has had no fever.  There has been no headache.  Will give him some iron  today.  His iron  saturation was only 20% when we last saw him.  I think IV iron  may help him out a little bit.  Overall, I would have to say that his performance status is probably ECOG 2.      Wt Readings from Last 3 Encounters:  01/26/24 144 lb (65.3 kg)  12/02/23 144 lb 0.6 oz (65.3 kg)  09/16/23 146 lb (66.2 kg)    Medications:  Allergies as of 01/26/2024       Reactions   Percocet [oxycodone -acetaminophen ] Nausea And Vomiting   Oxycodone  Other (See Comments)   Doesn't remember   Oxycodone -aspirin Nausea And Vomiting        Medication List        Accurate as of January 26, 2024 11:54 AM. If you have any questions, ask your nurse or doctor.           acetaminophen  325 MG tablet Commonly known as: TYLENOL  Take 650 mg by mouth 2 (two) times daily as needed.   acyclovir  200 MG/5ML suspension Commonly known as: Zovirax  Take 10 mLs (400 mg total) by mouth 4 (four) times daily.   alum & mag hydroxide-simeth 200-200-20 MG/5ML suspension Commonly known as: MAALOX/MYLANTA Take 30 mLs by mouth daily.   chlorhexidine  0.12 % solution Commonly known as: Peridex  Use as directed 15 mLs in the mouth or throat 4 (four) times daily.   doxylamine (Sleep) 25 MG tablet Commonly known as: UNISOM Take 25 mg by mouth at bedtime as needed.   fluconazole  40 MG/ML suspension Commonly known as: Diflucan  Take 5 mLs (200 mg total) by mouth daily.   levothyroxine  75 MCG tablet Commonly known as: SYNTHROID  TAKE 1 TABLET BY MOUTH EVERY DAY BEFORE BREAKFAST   megestrol  625 MG/5ML suspension Commonly known as: MEGACE  ES Take 5 mLs (625 mg total) by mouth daily.   Mekinist  2 MG tablet Generic drug: trametinib  dimethyl sulfoxide  Take 1 tablet (2 mg total) by mouth daily. Take 1 hour before or 2 hours after a meal.   omeprazole   40 MG capsule Commonly known as: PRILOSEC TAKE 1 CAPSULE (40 MG TOTAL) BY MOUTH DAILY.   ondansetron  8 MG tablet Commonly known as: ZOFRAN  TAKE 1 TABLET BY MOUTH EVERY 8 HOURS AS NEEDED FOR NAUSEA, VOMITING OR REFRACTORY NAUSEA / VOMITING. START ON THE THIRD DAY AFTER CHEMOTHERAPY.   polyethylene glycol powder 17 GM/SCOOP powder Commonly known as: GlycoLax  Take 17 g by mouth 2 (two) times daily.   pregabalin 50 MG capsule Commonly known as: LYRICA Take 50 mg by mouth 2 (two) times daily.   prochlorperazine  10 MG tablet Commonly known as: COMPAZINE  Take 10 mg by mouth every 6 (six) hours as needed.   Tafinlar  75 MG capsule Generic drug: dabrafenib mesylate  Take 2 capsules (150 mg total) by mouth 2 (two) times daily. Take on an empty stomach 1 hour before or 2 hours after meals.   temazepam  7.5 MG capsule Commonly  known as: Restoril  Take 1 capsule (7.5 mg total) by mouth at bedtime as needed for sleep.   traMADol  50 MG tablet Commonly known as: ULTRAM  TAKE 1 TABLET BY MOUTH EVERY 6 HOURS AS NEEDED        Allergies:  Allergies  Allergen Reactions   Percocet [Oxycodone -Acetaminophen ] Nausea And Vomiting   Oxycodone  Other (See Comments)    Doesn't remember   Oxycodone -Aspirin Nausea And Vomiting    Past Medical History, Surgical history, Social history, and Family History were reviewed and updated.  Review of Systems: Review of Systems  Constitutional: Negative.   HENT: Negative.    Eyes: Negative.   Respiratory: Negative.    Cardiovascular: Negative.   Gastrointestinal:  Negative for nausea and vomiting.  Genitourinary: Negative.   Musculoskeletal: Negative.   Skin: Negative.   Neurological: Negative.   Endo/Heme/Allergies: Negative.   Psychiatric/Behavioral: Negative.     Physical Exam: Vitals:   01/26/24 1100  BP: 130/64  Pulse: 69  Resp: 19  Temp: 98.2 F (36.8 C)  SpO2: 99%   Wt Readings from Last 3 Encounters:  01/26/24 144 lb (65.3 kg)  12/02/23 144 lb 0.6 oz (65.3 kg)  09/16/23 146 lb (66.2 kg)    Physical Exam Vitals reviewed.  HENT:     Head: Normocephalic and atraumatic.  Eyes:     Pupils: Pupils are equal, round, and reactive to light.  Cardiovascular:     Rate and Rhythm: Normal rate and regular rhythm.     Heart sounds: Normal heart sounds.  Pulmonary:     Effort: Pulmonary effort is normal.     Breath sounds: Normal breath sounds.  Abdominal:     General: Bowel sounds are normal.     Palpations: Abdomen is soft.  Musculoskeletal:        General: No tenderness or deformity. Normal range of motion.     Cervical back: Normal range of motion.  Lymphadenopathy:     Cervical: No cervical adenopathy.  Skin:    General: Skin is warm and dry.     Findings: No erythema or rash.  Neurological:     Mental Status: He is alert and oriented to person,  place, and time.  Psychiatric:        Behavior: Behavior normal.        Thought Content: Thought content normal.        Judgment: Judgment normal.      Lab Results  Component Value Date   WBC 8.3 01/26/2024   HGB 12.0 (L) 01/26/2024   HCT 37.2 (L) 01/26/2024   MCV 89.2  01/26/2024   PLT 359 01/26/2024   Lab Results  Component Value Date   FERRITIN 95 12/02/2023   IRON  60 12/02/2023   TIBC 308 12/02/2023   UIBC 248 12/02/2023   IRONPCTSAT 20 12/02/2023   Lab Results  Component Value Date   RETICCTPCT 0.4 07/29/2023   RBC 4.17 (L) 01/26/2024   No results found for: JONATHAN BONG [facility] Lab Results  Component Value Date   IGGSERUM 931 05/05/2022   IGMSERUM 59 05/05/2022   No results found for: STEPHANY CARLOTA BENSON MARKEL EARLA JOANNIE DOC VICK, SPEI   Chemistry      Component Value Date/Time   NA 139 01/26/2024 1029   NA 139 07/23/2020 0818   K 4.3 01/26/2024 1029   CL 101 01/26/2024 1029   CO2 27 01/26/2024 1029   BUN 17 01/26/2024 1029   BUN 10 07/23/2020 0818   CREATININE 1.02 01/26/2024 1029   CREATININE 1.06 06/27/2015 0837      Component Value Date/Time   CALCIUM  9.5 01/26/2024 1029   ALKPHOS 102 01/26/2024 1029   AST 15 01/26/2024 1029   ALT 12 01/26/2024 1029   BILITOT 0.4 01/26/2024 1029       Impression and Plan: Mr. Krupka is a very pleasant 83 yo caucasian gentleman with localized gastric cancer.  His cancer is progressing.  We had him on immunotherapy.  This eventually became somewhat effective. Because he does have the BRAF mutation, he was tried on Mekinist /Braftovi  which he did not tolerate due to severe mucositis.   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  I think this might be the only possible avenue that we might be to treat him.  Again, we have to use  Vyloy with chemotherapy.  He has really bad neuropathy so I cannot use oxaliplatin .  I really wanted to  try to avoid using a PICC line.  1 option that we might consider would be using Vyloy with Xeloda .  I know he has had Xeloda  with radiotherapy.  This was 2 years ago.  I think he tolerated okay.  I know this is very challenging.  Again this is all about quality of life.  I will see if we can use Vyloy.  We will try to get him back and start treatment on him.     Maude JONELLE Crease, MD 8/21/202511:54 AM

## 2024-01-27 ENCOUNTER — Other Ambulatory Visit: Payer: Self-pay

## 2024-01-30 ENCOUNTER — Encounter: Payer: Self-pay | Admitting: Hematology & Oncology

## 2024-01-30 MED ORDER — CAPECITABINE 500 MG PO TABS: 1000.0000 mg/m2 | ORAL_TABLET | Freq: Two times a day (BID) | ORAL | 3 refills | Status: DC

## 2024-01-31 ENCOUNTER — Telehealth: Payer: Self-pay

## 2024-01-31 ENCOUNTER — Other Ambulatory Visit (HOSPITAL_COMMUNITY): Payer: Self-pay

## 2024-01-31 ENCOUNTER — Telehealth: Payer: Self-pay | Admitting: Pharmacist

## 2024-01-31 ENCOUNTER — Encounter: Payer: Self-pay | Admitting: Hematology & Oncology

## 2024-01-31 DIAGNOSIS — C169 Malignant neoplasm of stomach, unspecified: Secondary | ICD-10-CM

## 2024-01-31 MED ORDER — CAPECITABINE 500 MG PO TABS: ORAL_TABLET | ORAL | 3 refills | Status: DC

## 2024-01-31 NOTE — Telephone Encounter (Signed)
 Oral Oncology Patient Advocate Encounter  Prior Authorization for capecitabine  has been approved.    PA# [id number] Effective dates: 01/31/24 through 06/06/24  Patients co-pay is $14.70.     Charlott Hamilton,  CPhT-Adv  she/her/hers [facility]  [facility] Pharmacy Technician Patient Advocate Specialist III [facility] Phone: [phone]  Fax: [phone] [email or web address]

## 2024-01-31 NOTE — Telephone Encounter (Addendum)
 Oral Oncology Pharmacist Encounter  Received new prescription for Xeloda  (capecitabine ) for the treatment of metastatic gastric cancer in conjunction with zolbetuximab, planned duration until disease progression or unacceptable drug toxicity.  CBC w/ Diff and CMP from 01/26/24 assessed, no relevant lab abnormalities requiring baseline dose adjustment required at this time. Prescription dose and frequency assessed for appropriateness. Currently calculating patient's total daily dose to 3560 mg (current Rx send in is 4000 mg total daily dose). Will reach out to Dr. Timmy and see if he wants to decrease patient to 4 tablets in AM and 3 tablets in PM.   ADDENDIUM 01/31/2024 11:38 AM OK per Dr. Timmy via staff message to reduce patient to 4 tablet PO in AM and 3 tablets PO in PM for 14 days on/7 off repeat every 21 days. Updated prescription sent to [facility] for processing.  Current medication list in Epic reviewed, DDIs with Xeloda  identified: Category C DDI between Xeloda  and Omeprazole  - proton-pump inhibitors can decrease efficacy of Xeloda  - will discuss with patient alternatives to omeprazole , such as H2RA's like famotidine  while on Xeloda . Category C DDI between Xeloda  and Ondansetron  due to risk of Qtc prolongation with fluorouracil  products. Noted patient only taking PRN and PO route, risk higher with IV administration. No change in therapy warranted at this time.   Evaluated chart and no patient barriers to medication adherence noted.   Prescription has been e-scribed to the [facility] for benefits analysis and approval.  Oral Oncology Clinic will continue to follow for insurance authorization, copayment issues, initial counseling and start date.  Asberry Macintosh, PharmD, BCPS, BCOP Hematology/Oncology Clinical Pharmacist [phone] 01/31/2024 8:41 AM

## 2024-01-31 NOTE — Telephone Encounter (Signed)
 Oral Oncology Patient Advocate Encounter   Received notification that prior authorization for Capecitabine  is required.   PA submitted on 01/31/24 Key [id number] Status is pending      Charlott Hamilton,  CPhT-Adv  she/her/hers [facility]  [facility] Pharm[facility] Services Pharmacy Technician Patient Advocate Specialist III [facility] Phone: [phone]  Fax: [phone] [email or web address]

## 2024-02-01 ENCOUNTER — Other Ambulatory Visit: Payer: Self-pay

## 2024-02-01 ENCOUNTER — Encounter: Payer: Self-pay | Admitting: Hematology & Oncology

## 2024-02-01 NOTE — Telephone Encounter (Signed)
 Oral Oncology Pharmacist Encounter  Phone call made to set up Xeloda  fill for patient. Per patient's daughter, [name], they are declining to fill the Xeloda  and stating he will not be taking the Xeloda  pills. Will remove Xeloda  from medication list so it is not filled.  Oral chemotherapy clinic will stop following at this time.   Asberry Macintosh, PharmD, BCPS, BCOP Hematology/Oncology Clinical Pharmacist [phone] 02/01/2024 11:44 AM

## 2024-02-02 ENCOUNTER — Inpatient Hospital Stay

## 2024-02-02 ENCOUNTER — Ambulatory Visit

## 2024-02-03 ENCOUNTER — Inpatient Hospital Stay

## 2024-02-03 ENCOUNTER — Other Ambulatory Visit

## 2024-02-03 ENCOUNTER — Ambulatory Visit

## 2024-02-03 ENCOUNTER — Encounter: Payer: Self-pay | Admitting: *Deleted

## 2024-02-03 NOTE — Progress Notes (Signed)
 Call received to scheduling from patient's daughter stating that pt does not wish to proceed with Vyloy treatments.  Dr. Timmy notified.

## 2024-02-10 ENCOUNTER — Other Ambulatory Visit: Payer: Self-pay

## 2024-02-10 ENCOUNTER — Encounter: Payer: Self-pay | Admitting: *Deleted

## 2024-02-10 DIAGNOSIS — G2581 Restless legs syndrome: Secondary | ICD-10-CM

## 2024-02-10 MED ORDER — TEMAZEPAM 7.5 MG PO CAPS: 7.5000 mg | ORAL_CAPSULE | Freq: Every evening | ORAL | 0 refills | Status: DC | PRN

## 2024-02-10 NOTE — Telephone Encounter (Signed)
 Daughter is requesting a refill on pt's Temazepam . Last refilled 12/19/23 #30. Please advise, thanks!

## 2024-02-13 ENCOUNTER — Other Ambulatory Visit: Payer: Self-pay | Admitting: *Deleted

## 2024-02-13 DIAGNOSIS — G2581 Restless legs syndrome: Secondary | ICD-10-CM

## 2024-02-13 MED ORDER — TEMAZEPAM 7.5 MG PO CAPS: 7.5000 mg | ORAL_CAPSULE | Freq: Every evening | ORAL | 0 refills | Status: DC | PRN

## 2024-02-16 ENCOUNTER — Encounter (HOSPITAL_COMMUNITY)

## 2024-02-21 ENCOUNTER — Other Ambulatory Visit: Payer: Self-pay

## 2024-02-24 ENCOUNTER — Other Ambulatory Visit

## 2024-02-24 ENCOUNTER — Ambulatory Visit

## 2024-02-24 ENCOUNTER — Inpatient Hospital Stay: Admitting: Hematology & Oncology

## 2024-03-01 ENCOUNTER — Inpatient Hospital Stay

## 2024-03-01 ENCOUNTER — Inpatient Hospital Stay: Attending: Hematology & Oncology

## 2024-03-01 ENCOUNTER — Encounter: Payer: Self-pay | Admitting: Hematology & Oncology

## 2024-03-01 ENCOUNTER — Other Ambulatory Visit (HOSPITAL_BASED_OUTPATIENT_CLINIC_OR_DEPARTMENT_OTHER): Payer: Self-pay

## 2024-03-01 ENCOUNTER — Inpatient Hospital Stay (HOSPITAL_BASED_OUTPATIENT_CLINIC_OR_DEPARTMENT_OTHER): Admitting: Hematology & Oncology

## 2024-03-01 ENCOUNTER — Other Ambulatory Visit: Payer: Self-pay

## 2024-03-01 VITALS — BP 138/66 | HR 76 | Temp 98.1°F | Resp 20 | Ht 69.0 in | Wt 139.1 lb

## 2024-03-01 DIAGNOSIS — Z8639 Personal history of other endocrine, nutritional and metabolic disease: Secondary | ICD-10-CM | POA: Insufficient documentation

## 2024-03-01 DIAGNOSIS — Z9221 Personal history of antineoplastic chemotherapy: Secondary | ICD-10-CM | POA: Diagnosis not present

## 2024-03-01 DIAGNOSIS — C169 Malignant neoplasm of stomach, unspecified: Secondary | ICD-10-CM

## 2024-03-01 DIAGNOSIS — G620 Drug-induced polyneuropathy: Secondary | ICD-10-CM

## 2024-03-01 DIAGNOSIS — C160 Malignant neoplasm of cardia: Secondary | ICD-10-CM

## 2024-03-01 LAB — CMP (CANCER CENTER ONLY)
ALT: 10 U/L (ref 0–44)
AST: 13 U/L — ABNORMAL LOW (ref 15–41)
Albumin: 3.9 g/dL (ref 3.5–5.0)
Alkaline Phosphatase: 114 U/L (ref 38–126)
Anion gap: 11 (ref 5–15)
BUN: 14 mg/dL (ref 8–23)
CO2: 27 mmol/L (ref 22–32)
Calcium: 9.6 mg/dL (ref 8.9–10.3)
Chloride: 101 mmol/L (ref 98–111)
Creatinine: 1.06 mg/dL (ref 0.61–1.24)
GFR, Estimated: >60 mL/min (ref >60)
Glucose, Bld: 101 mg/dL — ABNORMAL HIGH (ref 70–99)
Potassium: 4.6 mmol/L (ref 3.5–5.1)
Sodium: 138 mmol/L (ref 135–145)
Total Bilirubin: 0.6 mg/dL (ref 0.0–1.2)
Total Protein: 6.9 g/dL (ref 6.5–8.1)

## 2024-03-01 LAB — IRON AND IRON BINDING CAPACITY (CC-WL,HP ONLY)
Iron: 100 ug/dL (ref 45–182)
Saturation Ratios: 33 % (ref 17.9–39.5)
TIBC: 301 ug/dL (ref 250–450)
UIBC: 201 ug/dL

## 2024-03-01 LAB — CBC WITH DIFFERENTIAL (CANCER CENTER ONLY)
Abs Immature Granulocytes: 0.03 K/uL (ref 0.00–0.07)
Basophils Absolute: 0.1 K/uL (ref 0.0–0.1)
Basophils Relative: 1 %
Eosinophils Absolute: 0.6 K/uL — ABNORMAL HIGH (ref 0.0–0.5)
Eosinophils Relative: 8 %
HCT: 38.1 % — ABNORMAL LOW (ref 39.0–52.0)
Hemoglobin: 12.4 g/dL — ABNORMAL LOW (ref 13.0–17.0)
Immature Granulocytes: 0 %
Lymphocytes Relative: 23 %
Lymphs Abs: 1.9 K/uL (ref 0.7–4.0)
MCH: 29 pg (ref 26.0–34.0)
MCHC: 32.5 g/dL (ref 30.0–36.0)
MCV: 89.2 fL (ref 80.0–100.0)
Monocytes Absolute: 1 K/uL (ref 0.1–1.0)
Monocytes Relative: 12 %
Neutro Abs: 4.5 K/uL (ref 1.7–7.7)
Neutrophils Relative %: 56 %
Platelet Count: 394 K/uL (ref 150–400)
RBC: 4.27 MIL/uL (ref 4.22–5.81)
RDW: 14.2 % (ref 11.5–15.5)
WBC Count: 8.1 K/uL (ref 4.0–10.5)
nRBC: 0 % (ref 0.0–0.2)

## 2024-03-01 LAB — FERRITIN: Ferritin: 243 ng/mL (ref 24–336)

## 2024-03-01 MED ORDER — HYDROMORPHONE HCL 2 MG PO TABS
2.0000 mg | ORAL_TABLET | Freq: Four times a day (QID) | ORAL | 0 refills | Status: DC | PRN
  Filled 2024-03-01: qty 60, 15d supply, fill #0

## 2024-03-01 NOTE — Progress Notes (Signed)
 Hematology and Oncology Follow Up Visit  George Dyer [id number] Jul 17, 1940 83 y.o. 03/01/2024   Principle Diagnosis:  Stage IIB (T3N0M0) Gastric cancer -- declines surgery -recurrent-  HER2(-)/ BRAF (+)   Current Therapy:        FOLFOX - s/p cycle #4-  start on 09/15/2021 XRT/Xeloda  -- start on 12/07/2021 --completed on 01/12/2022 FOLFIRI/Opdivo  -- st/p cycle #1 on 10/19/2022 -- d/c FOLFIRI on 11/02/2022 --changed Opdivo  to 21-day cycles on 12/22/2022 - d/c on 07/06/2023     Interim History:  George Dyer is here today with his wife and daughter for follow-up.  Actually, I think that she is doing pretty well.  I am just happy that he is able to eat.  He is having no problems with gastric outlet obstruction.  He has lost a little bit of weight but again he is eating fairly well.  His bigger problem is the neuropathy.  No matter what we try, nothing seems to help.  We will trial him on some hydromorphone  now.  He is on tramadol  which he says does nothing for him.  This is a quality-of-life issue.  I want his quality life to be better.  I want his quality of life to be as active as possible.  I know that when he gets IV Dilaudid  in the office, he does feel better.  He has had no fever.  He has had no bleeding.  He has had no change in bowel or bladder habits.  He has had no rashes.  Overall, I would say that his performance status is probably ECOG 1.  .      Wt Readings from Last 3 Encounters:  03/01/24 139 lb 1.9 oz (63.1 kg)  01/26/24 144 lb (65.3 kg)  12/02/23 144 lb 0.6 oz (65.3 kg)    Medications:  Allergies as of 03/01/2024       Reactions   Percocet [oxycodone -acetaminophen ] Nausea And Vomiting   Oxycodone  Other (See Comments)   Doesn't remember   Oxycodone -aspirin Nausea And Vomiting        Medication List        Accurate as of March 01, 2024  8:46 AM. If you have any questions, ask your nurse or doctor.          STOP taking these medications     chlorhexidine  0.12 % solution Commonly known as: Peridex  Stopped by: Mariel Lukins R Randall Colden   doxylamine (Sleep) 25 MG tablet Commonly known as: UNISOM Stopped by: Alger Kerstein R Marenda Accardi       TAKE these medications    acetaminophen  325 MG tablet Commonly known as: TYLENOL  Take 650 mg by mouth 2 (two) times daily as needed.   alum & mag hydroxide-simeth 200-200-20 MG/5ML suspension Commonly known as: MAALOX/MYLANTA Take 30 mLs by mouth daily.   levothyroxine  75 MCG tablet Commonly known as: SYNTHROID  TAKE 1 TABLET BY MOUTH EVERY DAY BEFORE BREAKFAST   omeprazole  40 MG capsule Commonly known as: PRILOSEC TAKE 1 CAPSULE (40 MG TOTAL) BY MOUTH DAILY.   ondansetron  8 MG tablet Commonly known as: ZOFRAN  TAKE 1 TABLET BY MOUTH EVERY 8 HOURS AS NEEDED FOR NAUSEA, VOMITING OR REFRACTORY NAUSEA / VOMITING. START ON THE THIRD DAY AFTER CHEMOTHERAPY.   polyethylene glycol powder 17 GM/SCOOP powder Commonly known as: GlycoLax  Take 17 g by mouth 2 (two) times daily.   prochlorperazine  10 MG tablet Commonly known as: COMPAZINE  Take 10 mg by mouth every 6 (six) hours as needed.   temazepam  7.5 MG capsule  Commonly known as: Restoril  Take 1 capsule (7.5 mg total) by mouth at bedtime as needed for sleep.   traMADol  50 MG tablet Commonly known as: ULTRAM  Take 1 tablet (50 mg total) by mouth every 6 (six) hours as needed.        Allergies:  Allergies  Allergen Reactions   Percocet [Oxycodone -Acetaminophen ] Nausea And Vomiting   Oxycodone  Other (See Comments)    Doesn't remember   Oxycodone -Aspirin Nausea And Vomiting    Past Medical History, Surgical history, Social history, and Family History were reviewed and updated.  Review of Systems: Review of Systems  Constitutional: Negative.   HENT: Negative.    Eyes: Negative.   Respiratory: Negative.    Cardiovascular: Negative.   Gastrointestinal:  Negative for nausea and vomiting.  Genitourinary: Negative.   Musculoskeletal:  Negative.   Skin: Negative.   Neurological: Negative.   Endo/Heme/Allergies: Negative.   Psychiatric/Behavioral: Negative.     Physical Exam: Vitals:   03/01/24 0800  BP: 138/66  Pulse: 76  Resp: 20  Temp: 98.1 F (36.7 C)  SpO2: 98%   Wt Readings from Last 3 Encounters:  03/01/24 139 lb 1.9 oz (63.1 kg)  01/26/24 144 lb (65.3 kg)  12/02/23 144 lb 0.6 oz (65.3 kg)    Physical Exam Vitals reviewed.  HENT:     Head: Normocephalic and atraumatic.  Eyes:     Pupils: Pupils are equal, round, and reactive to light.  Cardiovascular:     Rate and Rhythm: Normal rate and regular rhythm.     Heart sounds: Normal heart sounds.  Pulmonary:     Effort: Pulmonary effort is normal.     Breath sounds: Normal breath sounds.  Abdominal:     General: Bowel sounds are normal.     Palpations: Abdomen is soft.  Musculoskeletal:        General: No tenderness or deformity. Normal range of motion.     Cervical back: Normal range of motion.  Lymphadenopathy:     Cervical: No cervical adenopathy.  Skin:    General: Skin is warm and dry.     Findings: No erythema or rash.  Neurological:     Mental Status: He is alert and oriented to person, place, and time.  Psychiatric:        Behavior: Behavior normal.        Thought Content: Thought content normal.        Judgment: Judgment normal.      Lab Results  Component Value Date   WBC 8.1 03/01/2024   HGB 12.4 (L) 03/01/2024   HCT 38.1 (L) 03/01/2024   MCV 89.2 03/01/2024   PLT 394 03/01/2024   Lab Results  Component Value Date   FERRITIN 95 12/02/2023   IRON  60 12/02/2023   TIBC 308 12/02/2023   UIBC 248 12/02/2023   IRONPCTSAT 20 12/02/2023   Lab Results  Component Value Date   RETICCTPCT 0.4 07/29/2023   RBC 4.27 03/01/2024   No results found for: JONATHAN BONG [facility] Lab Results  Component Value Date   IGGSERUM 931 05/05/2022   IGMSERUM 59 05/05/2022   No results found for: STEPHANY CARLOTA BENSON MARKEL EARLA JOANNIE DOC VICK, SPEI   Chemistry      Component Value Date/Time   NA 139 01/26/2024 1029   NA 139 07/23/2020 0818   K 4.3 01/26/2024 1029   CL 101 01/26/2024 1029   CO2 27 01/26/2024 1029   BUN 17 01/26/2024 1029   BUN  10 07/23/2020 0818   CREATININE 1.02 01/26/2024 1029   CREATININE 1.06 06/27/2015 0837      Component Value Date/Time   CALCIUM  9.5 01/26/2024 1029   ALKPHOS 102 01/26/2024 1029   AST 15 01/26/2024 1029   ALT 12 01/26/2024 1029   BILITOT 0.4 01/26/2024 1029       Impression and Plan: Mr. Kluger is a very pleasant 83 yo caucasian gentleman with localized gastric cancer.  His cancer is progressing.  We had him on immunotherapy.  This eventually became somewhat effective. Because he does have the BRAF mutation, he was tried on Mekinist /Braftovi  which he did not tolerate due to severe mucositis.  I talked to him again about the possibility of using Vyloy with Xeloda .  I know I talked to him about this before.  He will think about this again.  Hopefully, the hydromorphone  might help with his legs.  If he can just be more active, he will certainly be happy.  We probably will have to do another PET scan on him at the end of October.  I think this would be a reasonable time to do a PET scan.  I will then plan to see him back afterwards.    Maude JONELLE Crease, MD 9/25/20258:46 AM

## 2024-03-02 ENCOUNTER — Other Ambulatory Visit: Payer: Self-pay | Admitting: Family

## 2024-03-02 ENCOUNTER — Telehealth: Payer: Self-pay | Admitting: *Deleted

## 2024-03-02 ENCOUNTER — Other Ambulatory Visit: Payer: Self-pay | Admitting: *Deleted

## 2024-03-02 ENCOUNTER — Other Ambulatory Visit: Payer: Self-pay

## 2024-03-02 DIAGNOSIS — C160 Malignant neoplasm of cardia: Secondary | ICD-10-CM

## 2024-03-02 DIAGNOSIS — C169 Malignant neoplasm of stomach, unspecified: Secondary | ICD-10-CM

## 2024-03-02 MED ORDER — METHADONE HCL 5 MG PO TABS: 5.0000 mg | ORAL_TABLET | Freq: Three times a day (TID) | ORAL | 0 refills | Status: DC

## 2024-03-02 NOTE — Telephone Encounter (Signed)
 Daughter [name] called stating that her dad, George Dyer was up all night screaming in pain, same neuropathic pain, states that Dilaudid  Dr Timmy prescribed is not working.  Dr Timmy notified.  Ordered Dilaudid  to be doubled to 4 mg every 6 hours as needed for pain.  Dr Timmy also added Methadone  to patients pharmacy.  [name] called with this information. Left message on secure phone voicemail.

## 2024-03-02 NOTE — Addendum Note (Signed)
 Addended by: TIMMY COY R on: 03/02/2024 03:09 PM   Modules accepted: Orders

## 2024-03-02 NOTE — Telephone Encounter (Signed)
 Call received from patient's daughter and [facility] stating [facility] and no prescription was received for Methadone  for patient. RN had Dr. Timmy send a new script as requested and daughter notified.

## 2024-03-05 ENCOUNTER — Other Ambulatory Visit: Payer: Self-pay | Admitting: *Deleted

## 2024-03-05 DIAGNOSIS — C160 Malignant neoplasm of cardia: Secondary | ICD-10-CM

## 2024-03-05 DIAGNOSIS — C169 Malignant neoplasm of stomach, unspecified: Secondary | ICD-10-CM

## 2024-03-05 MED ORDER — METHADONE HCL 5 MG PO TABS: 5.0000 mg | ORAL_TABLET | Freq: Three times a day (TID) | ORAL | 0 refills | Status: DC

## 2024-03-06 ENCOUNTER — Telehealth: Payer: Self-pay | Admitting: Dietician

## 2024-03-06 NOTE — Telephone Encounter (Signed)
 Patient screened on MST. First attempt to reach. Provided my cell# on voice mail to return call to set up a nutrition consult.  Micheline Craven, RDN, LDN Registered Dietitian, [facility] Part Time Remote (Usual office hours: Tuesday-Thursday) Cell: [phone]

## 2024-03-09 ENCOUNTER — Other Ambulatory Visit: Payer: Self-pay

## 2024-03-12 ENCOUNTER — Other Ambulatory Visit: Payer: Self-pay

## 2024-03-13 ENCOUNTER — Encounter: Payer: Self-pay | Admitting: Hematology & Oncology

## 2024-03-13 ENCOUNTER — Other Ambulatory Visit: Payer: Self-pay

## 2024-03-13 DIAGNOSIS — G2581 Restless legs syndrome: Secondary | ICD-10-CM

## 2024-03-13 MED ORDER — TEMAZEPAM 7.5 MG PO CAPS: 7.5000 mg | ORAL_CAPSULE | Freq: Every evening | ORAL | 0 refills | Status: DC | PRN

## 2024-03-16 ENCOUNTER — Ambulatory Visit

## 2024-03-16 ENCOUNTER — Inpatient Hospital Stay: Admitting: Hematology & Oncology

## 2024-03-16 ENCOUNTER — Inpatient Hospital Stay

## 2024-03-19 ENCOUNTER — Other Ambulatory Visit: Payer: Self-pay

## 2024-03-29 ENCOUNTER — Encounter: Payer: Self-pay | Admitting: Hematology & Oncology

## 2024-03-29 ENCOUNTER — Other Ambulatory Visit: Payer: Self-pay | Admitting: *Deleted

## 2024-03-29 DIAGNOSIS — C169 Malignant neoplasm of stomach, unspecified: Secondary | ICD-10-CM

## 2024-03-29 DIAGNOSIS — C160 Malignant neoplasm of cardia: Secondary | ICD-10-CM

## 2024-03-29 MED ORDER — METHADONE HCL 5 MG PO TABS: 5.0000 mg | ORAL_TABLET | Freq: Three times a day (TID) | ORAL | 0 refills | Status: DC

## 2024-03-30 ENCOUNTER — Encounter (HOSPITAL_COMMUNITY)
Admission: RE | Admit: 2024-03-30 | Discharge: 2024-03-30 | Disposition: A | Discharge status: Home / Self Care | Source: Ambulatory Visit | Attending: Hematology & Oncology | Admitting: Hematology & Oncology

## 2024-03-30 DIAGNOSIS — C160 Malignant neoplasm of cardia: Secondary | ICD-10-CM | POA: Diagnosis present

## 2024-03-30 LAB — GLUCOSE, CAPILLARY: Glucose-Capillary: 108 mg/dL — ABNORMAL HIGH (ref 70–99)

## 2024-03-30 MED ORDER — FLUDEOXYGLUCOSE F - 18 (FDG) INJECTION
6.9500 | Freq: Once | INTRAVENOUS | Status: AC | PRN
  Administered 2024-03-30: 6.92 via INTRAVENOUS

## 2024-04-02 ENCOUNTER — Ambulatory Visit: Payer: Self-pay | Admitting: Hematology & Oncology

## 2024-04-02 NOTE — Telephone Encounter (Signed)
-----   Message from Maude JONELLE Crease sent at 04/02/2024  2:52 PM EDT ----- Please call and let him know that the PET scan actually looks better.  The tumor in the stomach and the tumor in the lung are both a little bit less active.  There is nothing new that we see.  This  is God at work.  Jeralyn ----- Message ----- From: Rebecka, Rad Results In Sent: 04/02/2024   1:51 PM EDT To: Maude JONELLE Crease, MD

## 2024-04-02 NOTE — Telephone Encounter (Signed)
 Called pt, wife answered advised he was in bed asleep. Gave wife Pet Results. Wife verbalized understanding, no further concerns.

## 2024-04-06 ENCOUNTER — Inpatient Hospital Stay

## 2024-04-06 ENCOUNTER — Ambulatory Visit: Admitting: Hematology & Oncology

## 2024-04-06 ENCOUNTER — Other Ambulatory Visit

## 2024-04-06 ENCOUNTER — Ambulatory Visit

## 2024-04-16 ENCOUNTER — Encounter: Payer: Self-pay | Admitting: Hematology & Oncology

## 2024-04-16 ENCOUNTER — Inpatient Hospital Stay: Attending: Hematology & Oncology

## 2024-04-16 ENCOUNTER — Inpatient Hospital Stay

## 2024-04-16 ENCOUNTER — Inpatient Hospital Stay: Admitting: Hematology & Oncology

## 2024-04-16 VITALS — BP 143/78 | HR 85 | Temp 98.2°F | Resp 20 | Ht 69.0 in | Wt 138.0 lb

## 2024-04-16 DIAGNOSIS — G629 Polyneuropathy, unspecified: Secondary | ICD-10-CM | POA: Insufficient documentation

## 2024-04-16 DIAGNOSIS — C169 Malignant neoplasm of stomach, unspecified: Secondary | ICD-10-CM | POA: Diagnosis not present

## 2024-04-16 DIAGNOSIS — C160 Malignant neoplasm of cardia: Secondary | ICD-10-CM | POA: Diagnosis not present

## 2024-04-16 DIAGNOSIS — G620 Drug-induced polyneuropathy: Secondary | ICD-10-CM

## 2024-04-16 LAB — CMP (CANCER CENTER ONLY)
ALT: 9 U/L (ref 0–44)
AST: 15 U/L (ref 15–41)
Albumin: 4 g/dL (ref 3.5–5.0)
Alkaline Phosphatase: 91 U/L (ref 38–126)
Anion gap: 10 (ref 5–15)
BUN: 15 mg/dL (ref 8–23)
CO2: 27 mmol/L (ref 22–32)
Calcium: 9.6 mg/dL (ref 8.9–10.3)
Chloride: 101 mmol/L (ref 98–111)
Creatinine: 0.89 mg/dL (ref 0.61–1.24)
GFR, Estimated: >60 mL/min (ref >60)
Glucose, Bld: 76 mg/dL (ref 70–99)
Potassium: 4.8 mmol/L (ref 3.5–5.1)
Sodium: 138 mmol/L (ref 135–145)
Total Bilirubin: 0.6 mg/dL (ref 0.0–1.2)
Total Protein: 6.7 g/dL (ref 6.5–8.1)

## 2024-04-16 LAB — CBC WITH DIFFERENTIAL (CANCER CENTER ONLY)
Abs Immature Granulocytes: 0.04 K/uL (ref 0.00–0.07)
Basophils Absolute: 0.1 K/uL (ref 0.0–0.1)
Basophils Relative: 1 %
Eosinophils Absolute: 0.6 K/uL — ABNORMAL HIGH (ref 0.0–0.5)
Eosinophils Relative: 7 %
HCT: 34.8 % — ABNORMAL LOW (ref 39.0–52.0)
Hemoglobin: 11.1 g/dL — ABNORMAL LOW (ref 13.0–17.0)
Immature Granulocytes: 0 %
Lymphocytes Relative: 18 %
Lymphs Abs: 1.6 K/uL (ref 0.7–4.0)
MCH: 29.4 pg (ref 26.0–34.0)
MCHC: 31.9 g/dL (ref 30.0–36.0)
MCV: 92.1 fL (ref 80.0–100.0)
Monocytes Absolute: 0.9 K/uL (ref 0.1–1.0)
Monocytes Relative: 10 %
Neutro Abs: 5.8 K/uL (ref 1.7–7.7)
Neutrophils Relative %: 64 %
Platelet Count: 449 K/uL — ABNORMAL HIGH (ref 150–400)
RBC: 3.78 MIL/uL — ABNORMAL LOW (ref 4.22–5.81)
RDW: 13.7 % (ref 11.5–15.5)
WBC Count: 9.1 K/uL (ref 4.0–10.5)
nRBC: 0 % (ref 0.0–0.2)

## 2024-04-16 MED ORDER — TRAMADOL HCL 50 MG PO TABS: 50.0000 mg | ORAL_TABLET | Freq: Four times a day (QID) | ORAL | 0 refills | Status: AC | PRN

## 2024-04-16 NOTE — Progress Notes (Signed)
 Hematology and Oncology Follow Up Visit  George Dyer [id number] [birth year] 83 y.o. 04/16/2024   Principle Diagnosis:  Stage IIB (T3N0M0) Gastric cancer -- declines surgery -recurrent-  HER2(-)/ BRAF (+)   Current Therapy:        FOLFOX - s/p cycle #4-  start on 09/15/2021 XRT/Xeloda  -- start on 12/07/2021 --completed on 01/12/2022 FOLFIRI/Opdivo  -- st/p cycle #1 on 10/19/2022 -- d/c FOLFIRI on 11/02/2022 --changed Opdivo  to 21-day cycles on 12/22/2022 - d/c on 07/06/2023     Interim History:  George Dyer is here today with his daughter for follow-up.  In reality, his poor wife broke her foot.  She required surgery this.  This was 2 weeks ago.  I feel bad for her.  He seems to be managing okay.  Again, his biggest issue is the neuropathy.  He is on methadone .  He is on tramadol .  This seems to be helping his neuropathy a little bit.  We did do a PET scan on him.  This was done in October.  The PET scan surprising, showed tha the tumor in the stomach actually was a bit better.  I do not think it grown.  It had slightly decreased activity.  Just happy that there is no evidence of gastric outlet.  I do worry that this could happen in the future.  He is trying to cut out his sugar.  He is trying to watch what he eats overall.  I told he has to have small frequent meals.  He has had no nausea or vomiting.  He has had no fever.  He has had no bleeding.  He has had no change in bowel or bladder habits.  Overall, I will say that his performance status is probably ECOG 1-2.     Wt Readings from Last 3 Encounters:  04/16/24 138 lb 0.6 oz (62.6 kg)  03/01/24 139 lb 1.9 oz (63.1 kg)  01/26/24 144 lb (65.3 kg)    Medications:  Allergies as of 04/16/2024       Reactions   Percocet [oxycodone -acetaminophen ] Nausea And Vomiting   Oxycodone  Other (See Comments)   Doesn't remember   Oxycodone -aspirin Nausea And Vomiting        Medication List        Accurate as of April 16, 2024 10:37 AM. If you have any questions, ask your nurse or doctor.          acetaminophen  325 MG tablet Commonly known as: TYLENOL  Take 650 mg by mouth 2 (two) times daily as needed.   alum & mag hydroxide-simeth 200-200-20 MG/5ML suspension Commonly known as: MAALOX/MYLANTA Take 30 mLs by mouth daily.   HYDROmorphone  2 MG tablet Commonly known as: Dilaudid  Take 1 tablet (2 mg total) by mouth every 6 (six) hours as needed for severe pain (pain score 7-10).   levothyroxine  75 MCG tablet Commonly known as: SYNTHROID  TAKE 1 TABLET BY MOUTH EVERY DAY BEFORE BREAKFAST   methadone  5 MG tablet Commonly known as: DOLOPHINE  Take 1 tablet (5 mg total) by mouth 3 (three) times daily.   omeprazole  40 MG capsule Commonly known as: PRILOSEC TAKE 1 CAPSULE (40 MG TOTAL) BY MOUTH DAILY.   ondansetron  8 MG tablet Commonly known as: ZOFRAN  TAKE 1 TABLET BY MOUTH EVERY 8 HOURS AS NEEDED FOR NAUSEA, VOMITING OR REFRACTORY NAUSEA / VOMITING. START ON THE THIRD DAY AFTER CHEMOTHERAPY.   polyethylene glycol powder 17 GM/SCOOP powder Commonly known as: GlycoLax  Take 17 g by mouth 2 (two)  times daily.   prochlorperazine  10 MG tablet Commonly known as: COMPAZINE  Take 10 mg by mouth every 6 (six) hours as needed.   temazepam  7.5 MG capsule Commonly known as: Restoril  Take 1 capsule (7.5 mg total) by mouth at bedtime as needed for sleep.   traMADol  50 MG tablet Commonly known as: ULTRAM  Take 50 mg by mouth every 6 (six) hours as needed.        Allergies:  Allergies  Allergen Reactions   Percocet [Oxycodone -Acetaminophen ] Nausea And Vomiting   Oxycodone  Other (See Comments)    Doesn't remember   Oxycodone -Aspirin Nausea And Vomiting    Past Medical History, Surgical history, Social history, and Family History were reviewed and updated.  Review of Systems: Review of Systems  Constitutional: Negative.   HENT: Negative.    Eyes: Negative.   Respiratory: Negative.     Cardiovascular: Negative.   Gastrointestinal:  Negative for nausea and vomiting.  Genitourinary: Negative.   Musculoskeletal: Negative.   Skin: Negative.   Neurological: Negative.   Endo/Heme/Allergies: Negative.   Psychiatric/Behavioral: Negative.     Physical Exam: Vitals:   04/16/24 1035  BP: (!) 143/78  Pulse: 85  Resp: 20  Temp: 98.2 F (36.8 C)  SpO2: 100%   Wt Readings from Last 3 Encounters:  04/16/24 138 lb 0.6 oz (62.6 kg)  03/01/24 139 lb 1.9 oz (63.1 kg)  01/26/24 144 lb (65.3 kg)    Physical Exam Vitals reviewed.  HENT:     Head: Normocephalic and atraumatic.  Eyes:     Pupils: Pupils are equal, round, and reactive to light.  Cardiovascular:     Rate and Rhythm: Normal rate and regular rhythm.     Heart sounds: Normal heart sounds.  Pulmonary:     Effort: Pulmonary effort is normal.     Breath sounds: Normal breath sounds.  Abdominal:     General: Bowel sounds are normal.     Palpations: Abdomen is soft.  Musculoskeletal:        General: No tenderness or deformity. Normal range of motion.     Cervical back: Normal range of motion.  Lymphadenopathy:     Cervical: No cervical adenopathy.  Skin:    General: Skin is warm and dry.     Findings: No erythema or rash.  Neurological:     Mental Status: He is alert and oriented to person, place, and time.  Psychiatric:        Behavior: Behavior normal.        Thought Content: Thought content normal.        Judgment: Judgment normal.      Lab Results  Component Value Date   WBC 9.1 04/16/2024   HGB 11.1 (L) 04/16/2024   HCT 34.8 (L) 04/16/2024   MCV 92.1 04/16/2024   PLT 449 (H) 04/16/2024   Lab Results  Component Value Date   FERRITIN 243 03/01/2024   IRON  100 03/01/2024   TIBC 301 03/01/2024   UIBC 201 03/01/2024   IRONPCTSAT 33 03/01/2024   Lab Results  Component Value Date   RETICCTPCT 0.4 07/29/2023   RBC 3.78 (L) 04/16/2024   No results found for: JONATHAN BONG  [facility] Lab Results  Component Value Date   IGGSERUM 931 05/05/2022   IGMSERUM 59 05/05/2022   No results found for: STEPHANY RINGS, A1GS, A2GS, BETS, BETA2SER, GAMS, MSPIKE, SPEI   Chemistry      Component Value Date/Time   NA 138 03/01/2024 0821   NA 139  07/23/2020 0818   K 4.6 03/01/2024 0821   CL 101 03/01/2024 0821   CO2 27 03/01/2024 0821   BUN 14 03/01/2024 0821   BUN 10 07/23/2020 0818   CREATININE 1.06 03/01/2024 0821   CREATININE 1.06 06/27/2015 0837      Component Value Date/Time   CALCIUM  9.6 03/01/2024 0821   ALKPHOS 114 03/01/2024 0821   AST 13 (L) 03/01/2024 0821   ALT 10 03/01/2024 0821   BILITOT 0.6 03/01/2024 0821       Impression and Plan: Mr. Javid is a very pleasant 83 yo caucasian gentleman with localized gastric cancer.  His cancer is progressing.  We had him on immunotherapy.  This eventually became somewhat effective. Because he does have the BRAF mutation, he was tried on Mekinist /Braftovi  which he did not tolerate due to severe mucositis.  I talked to him again about the possibility of using Vyloy with Xeloda .  I know I talked to him about this before.  He does not wish to do this because he is worried about the side effects.  Again we will just watch him.  Active surveillance is really the key.  I do not think we have to do another PET scan prior for another 3 or 4 months.  Hopefully, we can get the neuropathy under decent control so he will have some quality of life.    I would like to see him back probably around Christmas.  At that time, hopefully his wife will be able to be with him.    Maude JONELLE Crease, MD 11/10/202510:37 AM

## 2024-04-18 ENCOUNTER — Other Ambulatory Visit: Payer: Self-pay

## 2024-04-25 ENCOUNTER — Other Ambulatory Visit: Payer: Self-pay | Admitting: *Deleted

## 2024-04-25 ENCOUNTER — Encounter: Payer: Self-pay | Admitting: Hematology & Oncology

## 2024-04-25 DIAGNOSIS — G2581 Restless legs syndrome: Secondary | ICD-10-CM

## 2024-04-25 MED ORDER — TEMAZEPAM 7.5 MG PO CAPS: 7.5000 mg | ORAL_CAPSULE | Freq: Every evening | ORAL | 0 refills | Status: DC | PRN

## 2024-05-02 ENCOUNTER — Other Ambulatory Visit: Payer: Self-pay

## 2024-05-18 ENCOUNTER — Encounter: Payer: Self-pay | Admitting: Hematology & Oncology

## 2024-05-18 ENCOUNTER — Other Ambulatory Visit: Payer: Self-pay | Admitting: *Deleted

## 2024-05-18 DIAGNOSIS — C160 Malignant neoplasm of cardia: Secondary | ICD-10-CM

## 2024-05-18 DIAGNOSIS — C169 Malignant neoplasm of stomach, unspecified: Secondary | ICD-10-CM

## 2024-05-18 MED ORDER — METHADONE HCL 10 MG PO TABS: 10.0000 mg | ORAL_TABLET | Freq: Three times a day (TID) | ORAL | 0 refills | Status: DC

## 2024-05-28 ENCOUNTER — Inpatient Hospital Stay: Admitting: Hematology & Oncology

## 2024-05-28 ENCOUNTER — Other Ambulatory Visit: Payer: Self-pay

## 2024-05-28 ENCOUNTER — Inpatient Hospital Stay: Attending: Hematology & Oncology

## 2024-05-28 VITALS — BP 136/63 | HR 77 | Temp 98.2°F | Resp 19 | Ht 69.0 in | Wt 140.0 lb

## 2024-05-28 DIAGNOSIS — C160 Malignant neoplasm of cardia: Secondary | ICD-10-CM

## 2024-05-28 DIAGNOSIS — Z9221 Personal history of antineoplastic chemotherapy: Secondary | ICD-10-CM | POA: Diagnosis not present

## 2024-05-28 DIAGNOSIS — C163 Malignant neoplasm of pyloric antrum: Secondary | ICD-10-CM | POA: Diagnosis present

## 2024-05-28 DIAGNOSIS — C169 Malignant neoplasm of stomach, unspecified: Secondary | ICD-10-CM

## 2024-05-28 LAB — CBC WITH DIFFERENTIAL (CANCER CENTER ONLY)
Abs Immature Granulocytes: 0.02 K/uL (ref 0.00–0.07)
Basophils Absolute: 0.1 K/uL (ref 0.0–0.1)
Basophils Relative: 1 %
Eosinophils Absolute: 0.5 K/uL (ref 0.0–0.5)
Eosinophils Relative: 7 %
HCT: 32 % — ABNORMAL LOW (ref 39.0–52.0)
Hemoglobin: 10.3 g/dL — ABNORMAL LOW (ref 13.0–17.0)
Immature Granulocytes: 0 %
Lymphocytes Relative: 16 %
Lymphs Abs: 1.3 K/uL (ref 0.7–4.0)
MCH: 30.4 pg (ref 26.0–34.0)
MCHC: 32.2 g/dL (ref 30.0–36.0)
MCV: 94.4 fL (ref 80.0–100.0)
Monocytes Absolute: 0.9 K/uL (ref 0.1–1.0)
Monocytes Relative: 11 %
Neutro Abs: 4.9 K/uL (ref 1.7–7.7)
Neutrophils Relative %: 65 %
Platelet Count: 370 K/uL (ref 150–400)
RBC: 3.39 MIL/uL — ABNORMAL LOW (ref 4.22–5.81)
RDW: 12.1 % (ref 11.5–15.5)
WBC Count: 7.7 K/uL (ref 4.0–10.5)
nRBC: 0 % (ref 0.0–0.2)

## 2024-05-28 LAB — CMP (CANCER CENTER ONLY)
ALT: 10 U/L (ref 0–44)
AST: 13 U/L — ABNORMAL LOW (ref 15–41)
Albumin: 3.8 g/dL (ref 3.5–5.0)
Alkaline Phosphatase: 95 U/L (ref 38–126)
Anion gap: 11 (ref 5–15)
BUN: 11 mg/dL (ref 8–23)
CO2: 27 mmol/L (ref 22–32)
Calcium: 9.4 mg/dL (ref 8.9–10.3)
Chloride: 102 mmol/L (ref 98–111)
Creatinine: 0.91 mg/dL (ref 0.61–1.24)
GFR, Estimated: >60 mL/min
Glucose, Bld: 104 mg/dL — ABNORMAL HIGH (ref 70–99)
Potassium: 4.5 mmol/L (ref 3.5–5.1)
Sodium: 140 mmol/L (ref 135–145)
Total Bilirubin: 0.4 mg/dL (ref 0.0–1.2)
Total Protein: 6.3 g/dL — ABNORMAL LOW (ref 6.5–8.1)

## 2024-05-28 LAB — LACTATE DEHYDROGENASE: LDH: 105 U/L (ref 105–235)

## 2024-05-28 LAB — MAGNESIUM: Magnesium: 2 mg/dL (ref 1.7–2.4)

## 2024-05-28 NOTE — Progress Notes (Signed)
 " Hematology and Oncology Follow Up Visit  George Dyer [id number] 02-10-1941 83 y.o. 05/28/2024   Principle Diagnosis:  Stage IIB (T3N0M0) Gastric cancer -- declines surgery -recurrent-  HER2(-)/ BRAF (+)   Current Therapy:        FOLFOX - s/p cycle #4-  start on 09/15/2021 XRT/Xeloda  -- start on 12/07/2021 --completed on 01/12/2022 FOLFIRI/Opdivo  -- st/p cycle #1 on 10/19/2022 -- d/c FOLFIRI on 11/02/2022 --changed Opdivo  to 21-day cycles on 12/22/2022 - d/c on 07/06/2023     Interim History:  George Dyer is here today with his daughter for follow-up.  I will say that he actually looks pretty good.  I am very impressed by this.  He did have a very nice Thanksgiving.  He still eating fairly well.  His weight has gone up a little bit.  We increase his methadone  dose to 10 mg 3 times daily.  This really has helped him.  Also happy about this.  This is all about quality of life.  He still has some neuropathy.  His wife is still recovering from a broken foot.  I hate that she had a break.  She had surgery for this.  It has been a slow recovery.  He has had no bleeding.  He has had no change in bowel or bladder habits.   His eyes are said that we did back in September to the fair to 43 with an iron  saturation of 33%.  He has had no fever.  There is been no cough.  He has had no dysphagia or odynophagia.  Again, he has had no problems with nausea or vomiting.  Overall, I will say that his performance status is probably ECOG 2.       Wt Readings from Last 3 Encounters:  05/28/24 140 lb (63.5 kg)  04/16/24 138 lb 0.6 oz (62.6 kg)  03/01/24 139 lb 1.9 oz (63.1 kg)    Medications:  Allergies as of 05/28/2024       Reactions   Percocet [oxycodone -acetaminophen ] Nausea And Vomiting   Oxycodone  Other (See Comments)   Doesn't remember   Oxycodone -aspirin Nausea And Vomiting        Medication List        Accurate as of May 28, 2024 11:00 AM. If you have any  questions, ask your nurse or doctor.          STOP taking these medications    HYDROmorphone  2 MG tablet Commonly known as: Dilaudid  Stopped by: [name], MD       TAKE these medications    acetaminophen  325 MG tablet Commonly known as: TYLENOL  Take 650 mg by mouth 2 (two) times daily as needed.   alum & mag hydroxide-simeth 200-200-20 MG/5ML suspension Commonly known as: MAALOX/MYLANTA Take 30 mLs by mouth daily.   levothyroxine  75 MCG tablet Commonly known as: SYNTHROID  TAKE 1 TABLET BY MOUTH EVERY DAY BEFORE BREAKFAST   methadone  10 MG tablet Commonly known as: DOLOPHINE  Take 1 tablet (10 mg total) by mouth 3 (three) times daily.   omeprazole  40 MG capsule Commonly known as: PRILOSEC TAKE 1 CAPSULE (40 MG TOTAL) BY MOUTH DAILY.   ondansetron  8 MG tablet Commonly known as: ZOFRAN  TAKE 1 TABLET BY MOUTH EVERY 8 HOURS AS NEEDED FOR NAUSEA, VOMITING OR REFRACTORY NAUSEA / VOMITING. START ON THE THIRD DAY AFTER CHEMOTHERAPY.   polyethylene glycol powder 17 GM/SCOOP powder Commonly known as: GlycoLax  Take 17 g by mouth 2 (two) times daily.   prochlorperazine   10 MG tablet Commonly known as: COMPAZINE  Take 10 mg by mouth every 6 (six) hours as needed.   temazepam  7.5 MG capsule Commonly known as: Restoril  Take 1 capsule (7.5 mg total) by mouth at bedtime as needed for sleep.   traMADol  50 MG tablet Commonly known as: ULTRAM  Take 1 tablet (50 mg total) by mouth every 6 (six) hours as needed.        Allergies:  Allergies  Allergen Reactions   Percocet [Oxycodone -Acetaminophen ] Nausea And Vomiting   Oxycodone  Other (See Comments)    Doesn't remember   Oxycodone -Aspirin Nausea And Vomiting    Past Medical History, Surgical history, Social history, and Family History were reviewed and updated.  Review of Systems: Review of Systems  Constitutional: Negative.   HENT: Negative.    Eyes: Negative.   Respiratory: Negative.    Cardiovascular:  Negative.   Gastrointestinal:  Negative for nausea and vomiting.  Genitourinary: Negative.   Musculoskeletal: Negative.   Skin: Negative.   Neurological: Negative.   Endo/Heme/Allergies: Negative.   Psychiatric/Behavioral: Negative.     Physical Exam: Vitals:   05/28/24 1000  BP: 136/63  Pulse: 77  Resp: 19  Temp: 98.2 F (36.8 C)  SpO2: 100%   Wt Readings from Last 3 Encounters:  05/28/24 140 lb (63.5 kg)  04/16/24 138 lb 0.6 oz (62.6 kg)  03/01/24 139 lb 1.9 oz (63.1 kg)    Physical Exam Vitals reviewed.  HENT:     Head: Normocephalic and atraumatic.  Eyes:     Pupils: Pupils are equal, round, and reactive to light.  Cardiovascular:     Rate and Rhythm: Normal rate and regular rhythm.     Heart sounds: Normal heart sounds.  Pulmonary:     Effort: Pulmonary effort is normal.     Breath sounds: Normal breath sounds.  Abdominal:     General: Bowel sounds are normal.     Palpations: Abdomen is soft.  Musculoskeletal:        General: No tenderness or deformity. Normal range of motion.     Cervical back: Normal range of motion.  Lymphadenopathy:     Cervical: No cervical adenopathy.  Skin:    General: Skin is warm and dry.     Findings: No erythema or rash.  Neurological:     Mental Status: He is alert and oriented to person, place, and time.  Psychiatric:        Behavior: Behavior normal.        Thought Content: Thought content normal.        Judgment: Judgment normal.      Lab Results  Component Value Date   WBC 7.7 05/28/2024   HGB 10.3 (L) 05/28/2024   HCT 32.0 (L) 05/28/2024   MCV 94.4 05/28/2024   PLT 370 05/28/2024   Lab Results  Component Value Date   FERRITIN 243 03/01/2024   IRON  100 03/01/2024   TIBC 301 03/01/2024   UIBC 201 03/01/2024   IRONPCTSAT 33 03/01/2024   Lab Results  Component Value Date   RETICCTPCT 0.4 07/29/2023   RBC 3.39 (L) 05/28/2024   No results found for: JONATHAN BONG [facility] Lab Results   Component Value Date   IGGSERUM 931 05/05/2022   IGMSERUM 59 05/05/2022   No results found for: TOTALPROTELP, ALBUMINELP, A1GS, A2GS, BETS, BETA2SER, GAMS, MSPIKE, SPEI   Chemistry      Component Value Date/Time   NA 140 05/28/2024 0943   NA 139 07/23/2020 0818  K 4.5 05/28/2024 0943   CL 102 05/28/2024 0943   CO2 27 05/28/2024 0943   BUN 11 05/28/2024 0943   BUN 10 07/23/2020 0818   CREATININE 0.91 05/28/2024 0943   CREATININE 1.06 06/27/2015 0837      Component Value Date/Time   CALCIUM  9.4 05/28/2024 0943   ALKPHOS 95 05/28/2024 0943   AST 13 (L) 05/28/2024 0943   ALT 10 05/28/2024 0943   BILITOT 0.4 05/28/2024 0943       Impression and Plan: George Dyer is a very pleasant 83 yo caucasian gentleman with localized gastric cancer.   We are just observing him for right now.  He will has had a hard time with treatments.  Will go ahead and get him set up with another PET scan.  We will do 1 in January.  I think this would be reasonable.  Again I just want his quality of life to be good.  Norm be able to enjoy the holidays.  I want to be able to eat and not get sick.  I am glad that the methadone  has helped.  Will plan to get him back in another month.      Maude JONELLE Crease, MD 12/22/202511:00 AM  "

## 2024-05-29 ENCOUNTER — Other Ambulatory Visit: Payer: Self-pay

## 2024-05-30 ENCOUNTER — Other Ambulatory Visit: Payer: Self-pay

## 2024-06-04 ENCOUNTER — Other Ambulatory Visit: Payer: Self-pay | Admitting: Hematology & Oncology

## 2024-06-04 ENCOUNTER — Encounter: Payer: Self-pay | Admitting: Hematology & Oncology

## 2024-06-04 DIAGNOSIS — G2581 Restless legs syndrome: Secondary | ICD-10-CM

## 2024-06-13 ENCOUNTER — Encounter: Payer: Self-pay | Admitting: Hematology & Oncology

## 2024-06-14 ENCOUNTER — Ambulatory Visit (HOSPITAL_COMMUNITY)
Admission: RE | Admit: 2024-06-14 | Discharge: 2024-06-14 | Disposition: A | Source: Ambulatory Visit | Attending: Hematology & Oncology | Admitting: Hematology & Oncology

## 2024-06-14 ENCOUNTER — Ambulatory Visit: Payer: Self-pay | Admitting: Hematology & Oncology

## 2024-06-14 DIAGNOSIS — C160 Malignant neoplasm of cardia: Secondary | ICD-10-CM | POA: Insufficient documentation

## 2024-06-14 LAB — GLUCOSE, CAPILLARY: Glucose-Capillary: 83 mg/dL (ref 70–99)

## 2024-06-14 MED ORDER — FLUDEOXYGLUCOSE F - 18 (FDG) INJECTION
7.0000 | Freq: Once | INTRAVENOUS | Status: AC
  Administered 2024-06-14: 6.98 via INTRAVENOUS

## 2024-06-20 ENCOUNTER — Encounter: Payer: Self-pay | Admitting: Hematology & Oncology

## 2024-06-20 ENCOUNTER — Other Ambulatory Visit: Payer: Self-pay

## 2024-06-20 DIAGNOSIS — C160 Malignant neoplasm of cardia: Secondary | ICD-10-CM

## 2024-06-20 DIAGNOSIS — C169 Malignant neoplasm of stomach, unspecified: Secondary | ICD-10-CM

## 2024-06-20 MED ORDER — METHADONE HCL 10 MG PO TABS: 10.0000 mg | ORAL_TABLET | Freq: Three times a day (TID) | ORAL | 0 refills | Status: AC

## 2024-06-28 ENCOUNTER — Encounter: Payer: Self-pay | Admitting: Hematology & Oncology

## 2024-06-29 ENCOUNTER — Ambulatory Visit (HOSPITAL_BASED_OUTPATIENT_CLINIC_OR_DEPARTMENT_OTHER)
Admission: RE | Admit: 2024-06-29 | Discharge: 2024-06-29 | Disposition: A | Discharge status: Home / Self Care | Source: Ambulatory Visit | Attending: Hematology & Oncology | Admitting: Hematology & Oncology

## 2024-06-29 ENCOUNTER — Inpatient Hospital Stay

## 2024-06-29 ENCOUNTER — Inpatient Hospital Stay: Attending: Hematology & Oncology

## 2024-06-29 ENCOUNTER — Inpatient Hospital Stay: Admitting: Hematology & Oncology

## 2024-06-29 ENCOUNTER — Ambulatory Visit: Payer: Self-pay | Admitting: Hematology & Oncology

## 2024-06-29 VITALS — BP 130/64 | HR 71 | Temp 98.3°F | Resp 18 | Wt 141.0 lb

## 2024-06-29 DIAGNOSIS — C160 Malignant neoplasm of cardia: Secondary | ICD-10-CM

## 2024-06-29 DIAGNOSIS — G2581 Restless legs syndrome: Secondary | ICD-10-CM | POA: Insufficient documentation

## 2024-06-29 LAB — RETICULOCYTES
Immature Retic Fract: 12.3 % (ref 2.3–15.9)
RBC.: 3.61 MIL/uL — ABNORMAL LOW (ref 4.22–5.81)
Retic Count, Absolute: 37.5 K/uL (ref 19.0–186.0)
Retic Ct Pct: 1 % (ref 0.4–3.1)

## 2024-06-29 LAB — CBC WITH DIFFERENTIAL (CANCER CENTER ONLY)
Abs Immature Granulocytes: 0.08 K/uL — ABNORMAL HIGH (ref 0.00–0.07)
Basophils Absolute: 0.1 K/uL (ref 0.0–0.1)
Basophils Relative: 1 %
Eosinophils Absolute: 0.5 K/uL (ref 0.0–0.5)
Eosinophils Relative: 6 %
HCT: 34.8 % — ABNORMAL LOW (ref 39.0–52.0)
Hemoglobin: 10.9 g/dL — ABNORMAL LOW (ref 13.0–17.0)
Immature Granulocytes: 1 %
Lymphocytes Relative: 20 %
Lymphs Abs: 1.7 K/uL (ref 0.7–4.0)
MCH: 29.6 pg (ref 26.0–34.0)
MCHC: 31.3 g/dL (ref 30.0–36.0)
MCV: 94.6 fL (ref 80.0–100.0)
Monocytes Absolute: 0.9 K/uL (ref 0.1–1.0)
Monocytes Relative: 11 %
Neutro Abs: 4.9 K/uL (ref 1.7–7.7)
Neutrophils Relative %: 61 %
Platelet Count: 406 K/uL — ABNORMAL HIGH (ref 150–400)
RBC: 3.68 MIL/uL — ABNORMAL LOW (ref 4.22–5.81)
RDW: 12.6 % (ref 11.5–15.5)
WBC Count: 8.1 K/uL (ref 4.0–10.5)
nRBC: 0 % (ref 0.0–0.2)

## 2024-06-29 LAB — CMP (CANCER CENTER ONLY)
ALT: 6 U/L (ref 0–44)
AST: 14 U/L — ABNORMAL LOW (ref 15–41)
Albumin: 4 g/dL (ref 3.5–5.0)
Alkaline Phosphatase: 101 U/L (ref 38–126)
Anion gap: 8 (ref 5–15)
BUN: 13 mg/dL (ref 8–23)
CO2: 31 mmol/L (ref 22–32)
Calcium: 9.7 mg/dL (ref 8.9–10.3)
Chloride: 102 mmol/L (ref 98–111)
Creatinine: 0.97 mg/dL (ref 0.61–1.24)
GFR, Estimated: >60 mL/min
Glucose, Bld: 92 mg/dL (ref 70–99)
Potassium: 4.5 mmol/L (ref 3.5–5.1)
Sodium: 141 mmol/L (ref 135–145)
Total Bilirubin: 0.5 mg/dL (ref 0.0–1.2)
Total Protein: 6.7 g/dL (ref 6.5–8.1)

## 2024-06-29 LAB — FERRITIN: Ferritin: 222 ng/mL (ref 24–336)

## 2024-06-29 LAB — IRON AND IRON BINDING CAPACITY (CC-WL,HP ONLY)
Iron: 100 ug/dL (ref 45–182)
Saturation Ratios: 34 % (ref 17.9–39.5)
TIBC: 297 ug/dL (ref 250–450)
UIBC: 197 ug/dL

## 2024-06-29 NOTE — Telephone Encounter (Signed)
 Advised via MyChart.

## 2024-06-29 NOTE — Progress Notes (Signed)
 " Hematology and Oncology Follow Up Visit  George Dyer [id number] 1941-03-10 84 y.o. 06/29/2024   Principle Diagnosis:  Stage IIB (T3N0M0) Gastric cancer -- declines surgery -recurrent-  HER2(-)/ BRAF (+)   Current Therapy:        FOLFOX - s/p cycle #4-  start on 09/15/2021 XRT/Xeloda  -- start on 12/07/2021 --completed on 01/12/2022 FOLFIRI/Opdivo  -- st/p cycle #1 on 10/19/2022 -- d/c FOLFIRI on 11/02/2022 --changed Opdivo  to 21-day cycles on 12/22/2022 - d/c on 07/06/2023     Interim History:  Mr. Brashier is here today with his wife for follow-up.  She is doing better.  She did break her right foot.  Thankfully she is getting better.  He did have a nice Christmas and New Years.  I am happy that he is doing well.  But we did do a PET scan on him.  The PET scan was done on 06/14/2024.  Essentially, everything looks stable on the PET scan.  There may be an area of very small activity in the right middle lobe.  Again this is not clear and the significance.    He has complained of some swelling in the left leg.  I think we are going to have to think about a Doppler of his left leg.  Given the fact that he does have gastric cancer, I think it be worthwhile to get a Doppler to make sure there is nothing going on in that leg.  He has horrible restless legs.  Methadone  does seem to help with this..  He has little bit of constipation.  He has had no fever.  He has had no cough.  He has had no bleeding.  He has had no headache.  Overall, I would say that his performance status is probably ECOG 2.      Wt Readings from Last 3 Encounters:  06/29/24 141 lb (64 kg)  05/28/24 140 lb (63.5 kg)  04/16/24 138 lb 0.6 oz (62.6 kg)    Medications:  Allergies as of 06/29/2024       Reactions   Percocet [oxycodone -acetaminophen ] Nausea And Vomiting   Oxycodone  Other (See Comments)   Doesn't remember   Oxycodone -aspirin Nausea And Vomiting        Medication List        Accurate as of  June 29, 2024 10:22 AM. If you have any questions, ask your nurse or doctor.          acetaminophen  325 MG tablet Commonly known as: TYLENOL  Take 650 mg by mouth 2 (two) times daily as needed.   alum & mag hydroxide-simeth 200-200-20 MG/5ML suspension Commonly known as: MAALOX/MYLANTA Take 30 mLs by mouth daily.   levothyroxine  75 MCG tablet Commonly known as: SYNTHROID  TAKE 1 TABLET BY MOUTH EVERY DAY BEFORE BREAKFAST   methadone  10 MG tablet Commonly known as: DOLOPHINE  Take 1 tablet (10 mg total) by mouth 3 (three) times daily.   omeprazole  40 MG capsule Commonly known as: PRILOSEC TAKE 1 CAPSULE (40 MG TOTAL) BY MOUTH DAILY.   ondansetron  8 MG tablet Commonly known as: ZOFRAN  TAKE 1 TABLET BY MOUTH EVERY 8 HOURS AS NEEDED FOR NAUSEA, VOMITING OR REFRACTORY NAUSEA / VOMITING. START ON THE THIRD DAY AFTER CHEMOTHERAPY.   polyethylene glycol powder 17 GM/SCOOP powder Commonly known as: GlycoLax  Take 17 g by mouth 2 (two) times daily.   prochlorperazine  10 MG tablet Commonly known as: COMPAZINE  Take 10 mg by mouth every 6 (six) hours as needed.   temazepam   7.5 MG capsule Commonly known as: RESTORIL  TAKE ONE CAPSULE BY MOUTH AT BEDTIME AS NEEDED FOR SLEEP   traMADol  50 MG tablet Commonly known as: ULTRAM  Take 1 tablet (50 mg total) by mouth every 6 (six) hours as needed.        Allergies:  Allergies  Allergen Reactions   Percocet [Oxycodone -Acetaminophen ] Nausea And Vomiting   Oxycodone  Other (See Comments)    Doesn't remember   Oxycodone -Aspirin Nausea And Vomiting    Past Medical History, Surgical history, Social history, and Family History were reviewed and updated.  Review of Systems: Review of Systems  Constitutional: Negative.   HENT: Negative.    Eyes: Negative.   Respiratory: Negative.    Cardiovascular: Negative.   Gastrointestinal:  Negative for nausea and vomiting.  Genitourinary: Negative.   Musculoskeletal: Negative.   Skin:  Negative.   Neurological: Negative.   Endo/Heme/Allergies: Negative.   Psychiatric/Behavioral: Negative.     Physical Exam: Vitals:   06/29/24 0943  BP: 130/64  Pulse: 71  Resp: 18  Temp: 98.3 F (36.8 C)  SpO2: 100%   Wt Readings from Last 3 Encounters:  06/29/24 141 lb (64 kg)  05/28/24 140 lb (63.5 kg)  04/16/24 138 lb 0.6 oz (62.6 kg)    Physical Exam Vitals reviewed.  HENT:     Head: Normocephalic and atraumatic.  Eyes:     Pupils: Pupils are equal, round, and reactive to light.  Cardiovascular:     Rate and Rhythm: Normal rate and regular rhythm.     Heart sounds: Normal heart sounds.  Pulmonary:     Effort: Pulmonary effort is normal.     Breath sounds: Normal breath sounds.  Abdominal:     General: Bowel sounds are normal.     Palpations: Abdomen is soft.  Musculoskeletal:        General: No tenderness or deformity. Normal range of motion.     Cervical back: Normal range of motion.  Lymphadenopathy:     Cervical: No cervical adenopathy.  Skin:    General: Skin is warm and dry.     Findings: No erythema or rash.  Neurological:     Mental Status: He is alert and oriented to person, place, and time.  Psychiatric:        Behavior: Behavior normal.        Thought Content: Thought content normal.        Judgment: Judgment normal.      Lab Results  Component Value Date   WBC 8.1 06/29/2024   HGB 10.9 (L) 06/29/2024   HCT 34.8 (L) 06/29/2024   MCV 94.6 06/29/2024   PLT 406 (H) 06/29/2024   Lab Results  Component Value Date   FERRITIN 243 03/01/2024   IRON  100 03/01/2024   TIBC 301 03/01/2024   UIBC 201 03/01/2024   IRONPCTSAT 33 03/01/2024   Lab Results  Component Value Date   RETICCTPCT 1.0 06/29/2024   RBC 3.61 (L) 06/29/2024   No results found for: JONATHAN BONG [facility] Lab Results  Component Value Date   IGGSERUM 931 05/05/2022   IGMSERUM 59 05/05/2022   No results found for: STEPHANY CARLOTA BENSON MARKEL EARLA JOANNIE DOC VICK, SPEI   Chemistry      Component Value Date/Time   NA 141 06/29/2024 0918   NA 139 07/23/2020 0818   K 4.5 06/29/2024 0918   CL 102 06/29/2024 0918   CO2 31 06/29/2024 0918   BUN 13 06/29/2024 0918   BUN  10 07/23/2020 0818   CREATININE 0.97 06/29/2024 0918   CREATININE 1.06 06/27/2015 0837      Component Value Date/Time   CALCIUM  9.7 06/29/2024 0918   ALKPHOS 101 06/29/2024 0918   AST 14 (L) 06/29/2024 0918   ALT 6 06/29/2024 0918   BILITOT 0.5 06/29/2024 0918       Impression and Plan: Mr. Hutzler is a very pleasant 84 yo caucasian gentleman with localized gastric cancer.   We are just observing him for right now.  I am very happy that everything seems to be going quite well with his malignancy.  He really has done incredibly well.  He has been off treatment for quite a while.  His main problem has been this restless leg.  Again, there is not much that we can do for this.  He has seen Neurology.  The methadone  does seem to help.  Hopefully, there is no blood clot in his left leg.  We will see what the Doppler study shows.  We will plan to get him back in 6 weeks.  I do not envision another PET scan probably until late March or early April.       Maude JONELLE Crease, MD 1/23/202610:22 AM  "

## 2024-06-29 NOTE — Telephone Encounter (Signed)
-----   Message from [name], MD sent at 06/29/2024  1:33 PM EST ----- Call and let him know that there is no blood clot in the leg.  He does have a Baker's cyst behind the left knee.  However, this should not be a problem.  Fantastic job.  George Dyer

## 2024-06-29 NOTE — Progress Notes (Signed)
 Blood work drawn by lab. Pt no longer has a PICC.

## 2024-07-04 ENCOUNTER — Other Ambulatory Visit: Payer: Self-pay

## 2024-07-04 ENCOUNTER — Encounter: Payer: Self-pay | Admitting: Hematology & Oncology

## 2024-07-05 ENCOUNTER — Other Ambulatory Visit: Payer: Self-pay

## 2024-07-05 DIAGNOSIS — G2581 Restless legs syndrome: Secondary | ICD-10-CM

## 2024-07-05 MED ORDER — TEMAZEPAM 7.5 MG PO CAPS: 7.5000 mg | ORAL_CAPSULE | Freq: Every evening | ORAL | 0 refills | Status: AC | PRN

## 2024-08-02 ENCOUNTER — Inpatient Hospital Stay: Admitting: Hematology & Oncology

## 2024-08-02 ENCOUNTER — Inpatient Hospital Stay: Attending: Hematology & Oncology

## 2024-08-02 ENCOUNTER — Inpatient Hospital Stay
